# Patient Record
Sex: Female | Born: 1935 | Race: White | Hispanic: No | Smoking: Never smoker
Health system: Southern US, Community
[De-identification: ages and names within clinical notes are randomized; demographics above are authoritative.]

## PROBLEM LIST (undated history)

## (undated) DIAGNOSIS — I4891 Unspecified atrial fibrillation: Secondary | ICD-10-CM

## (undated) DIAGNOSIS — E119 Type 2 diabetes mellitus without complications: Secondary | ICD-10-CM

## (undated) DIAGNOSIS — R55 Syncope and collapse: Secondary | ICD-10-CM

## (undated) DIAGNOSIS — I10 Essential (primary) hypertension: Secondary | ICD-10-CM

## (undated) DIAGNOSIS — K311 Adult hypertrophic pyloric stenosis: Secondary | ICD-10-CM

## (undated) DIAGNOSIS — R42 Dizziness and giddiness: Secondary | ICD-10-CM

## (undated) DIAGNOSIS — F0390 Unspecified dementia without behavioral disturbance: Secondary | ICD-10-CM

## (undated) DIAGNOSIS — K6381 Dieulafoy lesion of intestine: Secondary | ICD-10-CM

## (undated) DIAGNOSIS — F419 Anxiety disorder, unspecified: Secondary | ICD-10-CM

## (undated) DIAGNOSIS — I2510 Atherosclerotic heart disease of native coronary artery without angina pectoris: Secondary | ICD-10-CM

## (undated) DIAGNOSIS — J302 Other seasonal allergic rhinitis: Secondary | ICD-10-CM

## (undated) DIAGNOSIS — I639 Cerebral infarction, unspecified: Secondary | ICD-10-CM

## (undated) DIAGNOSIS — I5043 Acute on chronic combined systolic (congestive) and diastolic (congestive) heart failure: Secondary | ICD-10-CM

## (undated) DIAGNOSIS — S42301A Unspecified fracture of shaft of humerus, right arm, initial encounter for closed fracture: Secondary | ICD-10-CM

## (undated) DIAGNOSIS — I679 Cerebrovascular disease, unspecified: Secondary | ICD-10-CM

## (undated) DIAGNOSIS — K635 Polyp of colon: Secondary | ICD-10-CM

## (undated) DIAGNOSIS — M1990 Unspecified osteoarthritis, unspecified site: Secondary | ICD-10-CM

## (undated) DIAGNOSIS — I214 Non-ST elevation (NSTEMI) myocardial infarction: Secondary | ICD-10-CM

## (undated) DIAGNOSIS — E785 Hyperlipidemia, unspecified: Secondary | ICD-10-CM

## (undated) DIAGNOSIS — I255 Ischemic cardiomyopathy: Secondary | ICD-10-CM

## (undated) HISTORY — PX: ABDOMINAL HYSTERECTOMY: SHX81

## (undated) HISTORY — PX: OTHER SURGICAL HISTORY: SHX169

---

## 1997-11-20 DIAGNOSIS — I2510 Atherosclerotic heart disease of native coronary artery without angina pectoris: Secondary | ICD-10-CM

## 1997-11-20 HISTORY — DX: Atherosclerotic heart disease of native coronary artery without angina pectoris: I25.10

## 1997-11-20 HISTORY — PX: CORONARY ARTERY BYPASS GRAFT: SHX141

## 1998-06-01 ENCOUNTER — Inpatient Hospital Stay (HOSPITAL_COMMUNITY): Admission: AD | Admit: 1998-06-01 | Discharge: 1998-06-09 | Payer: Self-pay | Admitting: Cardiovascular Disease

## 1998-08-22 ENCOUNTER — Emergency Department (HOSPITAL_COMMUNITY): Admission: EM | Admit: 1998-08-22 | Discharge: 1998-08-22 | Payer: Self-pay | Admitting: Emergency Medicine

## 1998-08-31 ENCOUNTER — Emergency Department (HOSPITAL_COMMUNITY): Admission: EM | Admit: 1998-08-31 | Discharge: 1998-08-31 | Payer: Self-pay | Admitting: Emergency Medicine

## 1998-09-10 ENCOUNTER — Encounter: Payer: Self-pay | Admitting: Cardiovascular Disease

## 1998-09-10 ENCOUNTER — Ambulatory Visit (HOSPITAL_COMMUNITY): Admission: RE | Admit: 1998-09-10 | Discharge: 1998-09-10 | Payer: Self-pay | Admitting: Cardiovascular Disease

## 1998-09-20 ENCOUNTER — Emergency Department (HOSPITAL_COMMUNITY): Admission: EM | Admit: 1998-09-20 | Discharge: 1998-09-20 | Payer: Self-pay | Admitting: Emergency Medicine

## 2001-03-05 ENCOUNTER — Ambulatory Visit (HOSPITAL_COMMUNITY): Admission: RE | Admit: 2001-03-05 | Discharge: 2001-03-05 | Payer: Self-pay | Admitting: Family Medicine

## 2001-03-05 ENCOUNTER — Encounter: Payer: Self-pay | Admitting: Family Medicine

## 2001-06-27 ENCOUNTER — Ambulatory Visit (HOSPITAL_COMMUNITY): Admission: RE | Admit: 2001-06-27 | Discharge: 2001-06-27 | Payer: Self-pay | Admitting: Family Medicine

## 2001-06-27 ENCOUNTER — Encounter: Payer: Self-pay | Admitting: Family Medicine

## 2002-02-13 ENCOUNTER — Other Ambulatory Visit: Admission: RE | Admit: 2002-02-13 | Discharge: 2002-02-13 | Payer: Self-pay | Admitting: Dermatology

## 2002-02-25 ENCOUNTER — Encounter: Payer: Self-pay | Admitting: Family Medicine

## 2002-02-25 ENCOUNTER — Ambulatory Visit (HOSPITAL_COMMUNITY): Admission: RE | Admit: 2002-02-25 | Discharge: 2002-02-25 | Payer: Self-pay | Admitting: Family Medicine

## 2002-10-01 ENCOUNTER — Encounter: Payer: Self-pay | Admitting: General Surgery

## 2002-10-01 ENCOUNTER — Ambulatory Visit (HOSPITAL_COMMUNITY): Admission: RE | Admit: 2002-10-01 | Discharge: 2002-10-01 | Payer: Self-pay | Admitting: General Surgery

## 2002-10-23 ENCOUNTER — Emergency Department (HOSPITAL_COMMUNITY): Admission: EM | Admit: 2002-10-23 | Discharge: 2002-10-23 | Payer: Self-pay | Admitting: Emergency Medicine

## 2002-11-19 ENCOUNTER — Other Ambulatory Visit: Admission: RE | Admit: 2002-11-19 | Discharge: 2002-11-19 | Payer: Self-pay | Admitting: Dermatology

## 2002-12-19 ENCOUNTER — Encounter: Payer: Self-pay | Admitting: Family Medicine

## 2002-12-19 ENCOUNTER — Ambulatory Visit (HOSPITAL_COMMUNITY): Admission: RE | Admit: 2002-12-19 | Discharge: 2002-12-19 | Payer: Self-pay | Admitting: Family Medicine

## 2003-01-09 ENCOUNTER — Ambulatory Visit (HOSPITAL_COMMUNITY): Admission: RE | Admit: 2003-01-09 | Discharge: 2003-01-09 | Payer: Self-pay | Admitting: Cardiology

## 2003-01-09 ENCOUNTER — Encounter: Payer: Self-pay | Admitting: Cardiology

## 2003-01-26 ENCOUNTER — Encounter (HOSPITAL_COMMUNITY): Admission: RE | Admit: 2003-01-26 | Discharge: 2003-02-25 | Payer: Self-pay | Admitting: Cardiology

## 2004-01-05 ENCOUNTER — Ambulatory Visit (HOSPITAL_COMMUNITY): Admission: RE | Admit: 2004-01-05 | Discharge: 2004-01-05 | Payer: Self-pay | Admitting: Family Medicine

## 2004-10-07 ENCOUNTER — Ambulatory Visit (HOSPITAL_COMMUNITY): Admission: RE | Admit: 2004-10-07 | Discharge: 2004-10-07 | Payer: Self-pay | Admitting: Family Medicine

## 2005-05-19 ENCOUNTER — Ambulatory Visit (HOSPITAL_COMMUNITY): Admission: RE | Admit: 2005-05-19 | Discharge: 2005-05-19 | Payer: Self-pay | Admitting: Family Medicine

## 2005-10-09 ENCOUNTER — Ambulatory Visit (HOSPITAL_COMMUNITY): Admission: RE | Admit: 2005-10-09 | Discharge: 2005-10-09 | Payer: Self-pay | Admitting: Family Medicine

## 2005-10-18 ENCOUNTER — Ambulatory Visit (HOSPITAL_COMMUNITY): Admission: RE | Admit: 2005-10-18 | Discharge: 2005-10-18 | Payer: Self-pay | Admitting: Family Medicine

## 2005-10-18 ENCOUNTER — Ambulatory Visit: Payer: Self-pay | Admitting: *Deleted

## 2005-10-23 ENCOUNTER — Ambulatory Visit: Payer: Self-pay | Admitting: Cardiology

## 2005-10-25 ENCOUNTER — Ambulatory Visit: Payer: Self-pay | Admitting: Cardiology

## 2005-10-25 ENCOUNTER — Encounter (HOSPITAL_COMMUNITY): Admission: RE | Admit: 2005-10-25 | Discharge: 2005-10-25 | Payer: Self-pay | Admitting: Cardiology

## 2005-12-19 ENCOUNTER — Ambulatory Visit (HOSPITAL_COMMUNITY): Admission: RE | Admit: 2005-12-19 | Discharge: 2005-12-19 | Payer: Self-pay | Admitting: Family Medicine

## 2006-10-12 ENCOUNTER — Ambulatory Visit (HOSPITAL_COMMUNITY): Admission: RE | Admit: 2006-10-12 | Discharge: 2006-10-12 | Payer: Self-pay | Admitting: Family Medicine

## 2006-10-31 ENCOUNTER — Other Ambulatory Visit: Admission: RE | Admit: 2006-10-31 | Discharge: 2006-10-31 | Payer: Self-pay | Admitting: Family Medicine

## 2006-10-31 ENCOUNTER — Encounter (INDEPENDENT_AMBULATORY_CARE_PROVIDER_SITE_OTHER): Payer: Self-pay | Admitting: *Deleted

## 2007-06-05 ENCOUNTER — Ambulatory Visit (HOSPITAL_COMMUNITY): Admission: RE | Admit: 2007-06-05 | Discharge: 2007-06-05 | Payer: Self-pay | Admitting: Family Medicine

## 2007-08-05 ENCOUNTER — Ambulatory Visit (HOSPITAL_COMMUNITY): Admission: RE | Admit: 2007-08-05 | Discharge: 2007-08-05 | Payer: Self-pay | Admitting: Family Medicine

## 2007-10-15 ENCOUNTER — Ambulatory Visit (HOSPITAL_COMMUNITY): Admission: RE | Admit: 2007-10-15 | Discharge: 2007-10-15 | Payer: Self-pay | Admitting: Family Medicine

## 2008-09-01 ENCOUNTER — Encounter (HOSPITAL_COMMUNITY): Admission: RE | Admit: 2008-09-01 | Discharge: 2008-10-01 | Payer: Self-pay | Admitting: Family Medicine

## 2008-10-06 ENCOUNTER — Encounter (HOSPITAL_COMMUNITY): Admission: RE | Admit: 2008-10-06 | Discharge: 2008-11-05 | Payer: Self-pay | Admitting: Family Medicine

## 2008-12-10 ENCOUNTER — Emergency Department (HOSPITAL_COMMUNITY): Admission: EM | Admit: 2008-12-10 | Discharge: 2008-12-10 | Payer: Self-pay | Admitting: Emergency Medicine

## 2009-02-22 ENCOUNTER — Encounter: Payer: Self-pay | Admitting: Cardiology

## 2009-03-02 ENCOUNTER — Ambulatory Visit (HOSPITAL_COMMUNITY): Admission: RE | Admit: 2009-03-02 | Discharge: 2009-03-02 | Payer: Self-pay | Admitting: Family Medicine

## 2009-05-28 ENCOUNTER — Ambulatory Visit: Payer: Self-pay | Admitting: Cardiology

## 2009-05-28 DIAGNOSIS — I10 Essential (primary) hypertension: Secondary | ICD-10-CM | POA: Insufficient documentation

## 2009-05-28 DIAGNOSIS — M1990 Unspecified osteoarthritis, unspecified site: Secondary | ICD-10-CM | POA: Insufficient documentation

## 2009-05-28 DIAGNOSIS — E109 Type 1 diabetes mellitus without complications: Secondary | ICD-10-CM | POA: Insufficient documentation

## 2009-05-28 DIAGNOSIS — I4891 Unspecified atrial fibrillation: Secondary | ICD-10-CM

## 2009-05-28 DIAGNOSIS — I2510 Atherosclerotic heart disease of native coronary artery without angina pectoris: Secondary | ICD-10-CM | POA: Insufficient documentation

## 2009-05-28 DIAGNOSIS — K219 Gastro-esophageal reflux disease without esophagitis: Secondary | ICD-10-CM

## 2009-05-28 HISTORY — DX: Unspecified atrial fibrillation: I48.91

## 2009-06-16 ENCOUNTER — Ambulatory Visit: Payer: Self-pay | Admitting: Cardiology

## 2009-06-16 ENCOUNTER — Encounter (HOSPITAL_COMMUNITY): Admission: RE | Admit: 2009-06-16 | Discharge: 2009-07-16 | Payer: Self-pay | Admitting: Cardiology

## 2009-06-23 ENCOUNTER — Encounter: Payer: Self-pay | Admitting: Cardiology

## 2009-10-29 ENCOUNTER — Emergency Department (HOSPITAL_COMMUNITY): Admission: EM | Admit: 2009-10-29 | Discharge: 2009-10-29 | Payer: Self-pay | Admitting: Emergency Medicine

## 2009-11-19 ENCOUNTER — Ambulatory Visit (HOSPITAL_COMMUNITY): Admission: RE | Admit: 2009-11-19 | Discharge: 2009-11-19 | Payer: Self-pay | Admitting: Family Medicine

## 2009-11-22 ENCOUNTER — Emergency Department (HOSPITAL_COMMUNITY): Admission: EM | Admit: 2009-11-22 | Discharge: 2009-11-22 | Payer: Self-pay | Admitting: Internal Medicine

## 2009-11-22 ENCOUNTER — Encounter: Payer: Self-pay | Admitting: Orthopedic Surgery

## 2009-11-24 ENCOUNTER — Ambulatory Visit: Payer: Self-pay | Admitting: Orthopedic Surgery

## 2009-11-24 DIAGNOSIS — S42209A Unspecified fracture of upper end of unspecified humerus, initial encounter for closed fracture: Secondary | ICD-10-CM | POA: Insufficient documentation

## 2009-11-25 ENCOUNTER — Telehealth: Payer: Self-pay | Admitting: Orthopedic Surgery

## 2009-11-27 ENCOUNTER — Encounter: Payer: Self-pay | Admitting: Orthopedic Surgery

## 2009-11-29 ENCOUNTER — Telehealth: Payer: Self-pay | Admitting: Orthopedic Surgery

## 2009-12-01 ENCOUNTER — Encounter: Payer: Self-pay | Admitting: Orthopedic Surgery

## 2009-12-07 ENCOUNTER — Encounter: Payer: Self-pay | Admitting: Orthopedic Surgery

## 2009-12-09 ENCOUNTER — Telehealth: Payer: Self-pay | Admitting: Orthopedic Surgery

## 2009-12-15 ENCOUNTER — Ambulatory Visit: Payer: Self-pay | Admitting: Orthopedic Surgery

## 2009-12-17 ENCOUNTER — Encounter: Payer: Self-pay | Admitting: Orthopedic Surgery

## 2009-12-20 ENCOUNTER — Telehealth: Payer: Self-pay | Admitting: Orthopedic Surgery

## 2010-01-05 ENCOUNTER — Encounter: Payer: Self-pay | Admitting: Orthopedic Surgery

## 2010-01-07 ENCOUNTER — Encounter: Payer: Self-pay | Admitting: Orthopedic Surgery

## 2010-01-10 ENCOUNTER — Telehealth: Payer: Self-pay | Admitting: Orthopedic Surgery

## 2010-01-31 ENCOUNTER — Ambulatory Visit: Payer: Self-pay | Admitting: Orthopedic Surgery

## 2010-02-07 ENCOUNTER — Encounter (HOSPITAL_COMMUNITY): Admission: RE | Admit: 2010-02-07 | Discharge: 2010-03-18 | Payer: Self-pay | Admitting: Orthopedic Surgery

## 2010-02-07 ENCOUNTER — Encounter: Payer: Self-pay | Admitting: Orthopedic Surgery

## 2010-03-04 ENCOUNTER — Encounter: Payer: Self-pay | Admitting: Orthopedic Surgery

## 2010-03-21 ENCOUNTER — Encounter (HOSPITAL_COMMUNITY): Admission: RE | Admit: 2010-03-21 | Discharge: 2010-04-20 | Payer: Self-pay | Admitting: Orthopedic Surgery

## 2010-04-01 ENCOUNTER — Encounter: Payer: Self-pay | Admitting: Orthopedic Surgery

## 2010-04-04 ENCOUNTER — Ambulatory Visit: Payer: Self-pay | Admitting: Orthopedic Surgery

## 2010-12-11 ENCOUNTER — Encounter: Payer: Self-pay | Admitting: Family Medicine

## 2010-12-12 ENCOUNTER — Encounter: Payer: Self-pay | Admitting: Family Medicine

## 2010-12-20 NOTE — Miscellaneous (Signed)
Summary: [facility]  [facility]   Imported By: Cammie Sickle 12/29/2009 19:27:27  _____________________________________________________________________  External Attachment:    Type:   Image     Comment:   External Document

## 2010-12-20 NOTE — Progress Notes (Signed)
Summary: Call to Home Health + patient and family  Phone Note Outgoing Call   Call placed to: Home care Summary of Call: I contacted [facility] (11/24/09) re: order per office visit 11/24/09 for home health re: ADL's.  Told by the agent that in order for insurance to cover, that patient would need to be in skilled nursing care  to receive  assistance with daily living chores, or be needing home physical therapy, which patient is not, at this time. I have advised patient and resp party family members, Mr/Mrs Alicia Mason, at ph [phone]- spoke with today, 11/25/09.  I have given name and information for facility available on self pay basis:  [facility], .  Patient and family would like [place] to forward the order to [facility] for them to review and advise.  Done. Initial call taken by: Cammie Sickle,  November 25, 2009 12:30 PM

## 2010-12-20 NOTE — Miscellaneous (Signed)
Summary: OT Progress nopte  OT Progress nopte   Imported By: Jacklynn Ganong 04/04/2010 15:02:57  _____________________________________________________________________  External Attachment:    Type:   Image     Comment:   External Document

## 2010-12-20 NOTE — Assessment & Plan Note (Signed)
Summary: 3 WK RE-CK HUMERUS/XRAYS/FX CARE/MEDICARE,UHC/CAF   Visit Type:  Follow-up Referring Provider:  emergency room  CC:  fracture right humerus .  History of Present Illness: I saw Alicia Mason in the office today for a followup visit.  She is a 77 years old woman with the complaint of:  DOI: 11/22/09 fell, right humerus fracture.  19 days old   TREATMENT: Sling.  MEDS: Norco 5.  Scheduled for:  recheck with xrays right proximal humerus.  Complaints:  doing well, in sling today.  XRAYS 2 V RT SHOULDER  PROX HUM FRACTURE COMMINUTED, MEETS NEER CRITERIA FOR NON - OP TREATMENT  IMPR: HEALING PROX HUM FRX, STABLE ALIGNMENT  A/P: PHYS THEREAPY   8  WEEK F/U     Allergies (verified): No Known Drug Allergies   Impression & Recommendations:  Problem # 1:  FRACTURE, HUMERUS, PROXIMAL (ICD-812.00) Assessment Improved  Orders: Home Health Referral (Home Health) Post-Op Check [phone]) Shoulder x-ray,  minimum 2 views (98119)  Patient Instructions: 1)  PHYSICAL THERAPY  2)  RETURN AFTER 8 WEEKS  3)  SLING OPTIONAL

## 2010-12-20 NOTE — Assessment & Plan Note (Signed)
Summary: 2 M RE-CK/?XRAY RT HUMERUSMEDICARE/CAF   Visit Type:  Follow-up Referring Provider:  emergency room  CC:  right humerus.  History of Present Illness:   Alicia Mason is 77 year old female injured her RIGHT shoulder in January on the third has a RIGHT proximal humerus fracture she is now completed her physical therapy.  She still has quite a bit of stiffness. 11/22/2009 DOI:  MEDS: None   She is doing really well her physical therapy notes indicate that she's got functional range of motion now she's not having any significant pain and she is returned to normal activities and wishes to drive which is okay as long as she's got license  Followup as needed    Allergies: No Known Drug Allergies   Other Orders: Est. Patient Level II (95621)  Patient Instructions: 1)  Please schedule a follow-up appointment as needed.

## 2010-12-20 NOTE — Miscellaneous (Signed)
Summary: OT progress note  OT progress note   Imported By: Jacklynn Ganong 03/10/2010 10:15:20  _____________________________________________________________________  External Attachment:    Type:   Image     Comment:   External Document

## 2010-12-20 NOTE — Assessment & Plan Note (Signed)
Summary: 6 WK RE-CK RT HUMERUS FOL'G PT/MEDICARE/CAF   Visit Type:  Follow-up Referring Provider:  emergency room  CC:  recheck rt humerus.  History of Present Illness: Caryn Bee is 77 year old female injured her RIGHT shoulder in January on the third has a RIGHT proximal humerus fracture she is now completed her physical therapy.  She still has quite a bit of stiffness.  MEDS: None, was told not to take pain med Norco 5.    Recommend additional physical therapy to see if we can improve her range of motion.   Allergies: No Known Drug Allergies   Impression & Recommendations:  Problem # 1:  FRACTURE, HUMERUS, PROXIMAL (ICD-812.00) Assessment Improved  slightly improved  Orders: Post-Op Check (16109)  Patient Instructions: 1)  Council on aging arrange for PT, 6 weeks left shoulder outpatient  2)  f/u 2 mos recheck left shoulder range of motion

## 2010-12-20 NOTE — Miscellaneous (Signed)
Summary: [facility] plan of care  [facility] plan of care   Imported By: Jacklynn Ganong 12/16/2009 15:42:47  _____________________________________________________________________  External Attachment:    Type:   Image     Comment:   External Document

## 2010-12-20 NOTE — Progress Notes (Signed)
Summary: question from physical therapist   Phone Note Other Incoming   Caller: Physical therapist Summary of Call: Fernande Bras, physical therapist w/[facility], left msg requesting a verbal order for additional week of physical therapy, to = 4 wks.  States he put it through as 3X week for 3 weeks in error.  He had also recently added OT.Marland Kitchen  Please call him at cell [phone]. Initial call taken by: Cammie Sickle,  January 10, 2010 3:15 PM  Follow-up for Phone Call        advised ok for another week Follow-up by: Ether Griffins,  January 10, 2010 3:22 PM

## 2010-12-20 NOTE — Letter (Signed)
Summary: History form  History form   Imported By: Jacklynn Ganong 11/29/2009 09:41:00  _____________________________________________________________________  External Attachment:    Type:   Image     Comment:   External Document

## 2010-12-20 NOTE — Progress Notes (Signed)
Summary: discharging from home health care  Phone Note Other Incoming   Summary of Call: Alicia Mason/[facility] called about Alicia Mason ([birth year])  She said [name] has help that comes in the morning, and she is doing really good, is going to discharge her from homehealth care.   Alicia Mason can be reachead at [phone] Initial call taken by: Alicia Mason,  December 09, 2009 2:55 PM

## 2010-12-20 NOTE — Progress Notes (Signed)
Summary: call from [facility] nurse  Phone Note From Other Clinic   Caller: Nurse Summary of Call: [facility] nurse, Candiss Norse, from [facility], ph [phone].  She is there evaluating patient, and relays that she will schedule aid to come to assist with bathing,dressing, per order.  Wanted to verify if physical therapy needed at this time.  Advised not at this time per recent office visit.  Patient to be re-evaluated at appt 1/26/11in office. Initial call taken by: Cammie Sickle,  November 29, 2009 11:54 AM

## 2010-12-20 NOTE — Miscellaneous (Signed)
Summary: [facility] order  [facility] order   Imported By: Jacklynn Ganong 01/14/2010 09:22:57  _____________________________________________________________________  External Attachment:    Type:   Image     Comment:   External Document

## 2010-12-20 NOTE — Miscellaneous (Signed)
Summary: [facility] orders  [facility] orders   Imported By: Jacklynn Ganong 01/14/2010 09:24:31  _____________________________________________________________________  External Attachment:    Type:   Image     Comment:   External Document

## 2010-12-20 NOTE — Progress Notes (Signed)
Summary: call from physical therapist  Phone Note From Other Clinic   Caller: PHYSICAL THERAPIST Summary of Call: rec'd call from physical therapist, Kendell Bane, [facility], asking if patient may also have OT ordered with PT;  please call [phone], fax #[phone] Initial call taken by: Cammie Sickle,  December 20, 2009 6:15 PM  Follow-up for Phone Call        ok  Follow-up by: Fuller Canada MD,  December 21, 2009 2:01 PM  Additional Follow-up for Phone Call Additional follow up Details #1::        faxed order Additional Follow-up by: Ether Griffins,  December 22, 2009 8:58 AM

## 2010-12-20 NOTE — Miscellaneous (Signed)
Summary: OT Clinical evaluation  OT Clinical evaluation   Imported By: Jacklynn Ganong 02/16/2010 09:03:39  _____________________________________________________________________  External Attachment:    Type:   Image     Comment:   External Document

## 2010-12-20 NOTE — Assessment & Plan Note (Signed)
Summary: [facility] er 11/22/09 broken rt arm xr there/medicare/bsf   Vital Signs:  Patient profile:   77 year old female Pulse rate:   78 / minute Resp:     16 per minute  Vitals Entered By: Fuller Canada MD (November 24, 2009 8:50 AM)  Visit Type:  first visit Referring Provider:  emergency room  CC:  right shoulder pain.  History of Present Illness: I saw Alicia Mason in the office today for an initial visit.  She is a 77 years old woman who injured her RIGHT humerus on November 22, 2009.  This is actually the third recent fall for her.  She complains of 10 out of 10 pain relief I hydrocodone 5 mg.  Her pain is worse with moving arm.  There is no numbness she can use her fingers and wrist without any difficulty.  She complains of soreness and aching.  Meds: Lantus insulin, Topril, Quinipril, Pravastatin, Aspirin, Xanax, Cipro, Norco 5.  No injury to the arm before this injury.  Allergies (verified): No Known Drug Allergies  Family History: Father: deceased 25 in his sleep Mother: deceased 59 pneumonia Siblings:  FH of Cancer:  Family History of Diabetes Family History Coronary Heart Disease female < 39 Family History of Arthritis  Social History: Tobacco Use - No.  Alcohol Use - no Regular Exercise - no martial staus:  widow one cup of coffee per day  Review of Systems General:  Denies weight loss, weight gain, fever, chills, and fatigue. Cardiac :  Complains of poor circulation; denies chest pain, angina, heart attack, heart failure, blood clots, and phlebitis. Resp:  Complains of short of breath; denies difficulty breathing, COPD, cough, and pneumonia. GI:  Complains of nausea and vomiting; denies diarrhea, constipation, difficulty swallowing, ulcers, GERD, and reflux. GU:  Denies kidney failure, kidney transplant, kidney stones, burning, poor stream, testicular cancer, blood in urine, and . Neuro:  Denies headache, dizziness, migraines, numbness, weakness, tremor, and  unsteady walking. MS:  Denies joint pain, rheumatoid arthritis, joint swelling, gout, bone cancer, osteoporosis, and . Endo:  Denies thyroid disease, goiter, and diabetes. Psych:  Complains of anxiety; denies depression, mood swings, panic attack, bipolar, and schizophrenia. Derm:  Denies eczema, cancer, and itching. EENT:  Complains of poor vision; denies cataracts, glaucoma, poor hearing, vertigo, ears ringing, sinusitis, hoarseness, toothaches, and bleeding gums. Immunology:  Complains of seasonal allergies; denies sinus problems and allergic to bee stings. Lymphatic:  Denies lymph node cancer and lymph edema.  Physical Exam  Msk:  This is a well-developed well-nourished female with excellent grooming and hygiene she has a large body habitus  As normal pulse and perfusion to the RIGHT upper extremity with normal temperature of the extremity.  She is awake alert and oriented x3 her mood and affect is normal.  The nerve function the RIGHT hand is normal.  There is no lymphadenopathy on the RIGHT.  The skin is notable for significant ecchymosis and bruising throughout the RIGHT upper extremity.  She ambulates normally has trouble getting up on the exam table  Inspection reveals tenderness in the proximal humerus with decreased shoulder range of motion elbow wrist and hand motion remains normal motor exam in the elbow wrist and hand also remained normal the shoulder motor function was not tested  Wrist elbow and stable in terms of joint stability.     Impression & Recommendations:  Problem # 1:  FRACTURE, HUMERUS, PROXIMAL (ICD-812.00) Assessment New The x-rays were done at [facility]. The report  and the films have been reviewed.  I agree that there is approximately humerus fracture.  It meets the near criteria for nonoperative treatment.  Recommend sling for 3 weeks x-rays in 3 weeks therapy for 6 weeks to follow.  Patient lives alone will need home health care for bathing assist  feeding, cooking, toileting.   Orders: Home Health Referral [facility]) New Patient Level III [phone]) [place]ck Fx (56213)  Medications Added to Medication List This Visit: 1)  Norco 5-325 Mg Tabs (Hydrocodone-acetaminophen) .... One by mouth q 4 hrs as needed pain 2)  Promethazine Hcl 25 Mg Tabs (Promethazine hcl) .... 1/2 tablet every 4 hrs as needed nausea  Patient Instructions: 1)  Continue sling 2)  Come back in 3 weeks for xrays. 3)  Needs home health for assistance ADL's Prescriptions: PROMETHAZINE HCL 25 MG TABS (PROMETHAZINE HCL) 1/2 tablet every 4 hrs as needed nausea  #60 x 1   Entered and Authorized by:   Fuller Canada MD   Signed by:   Fuller Canada MD on 11/24/2009   Method used:   Faxed to ...       [facility] (retail)       [address]       [place], [place]  [address]       Ph: [phone]       Fax: [phone]   RxID:   [id number]

## 2010-12-20 NOTE — Miscellaneous (Signed)
Summary: [facility] order  [facility] order   Imported By: Jacklynn Ganong 12/16/2009 15:41:33  _____________________________________________________________________  External Attachment:    Type:   Image     Comment:   External Document

## 2010-12-20 NOTE — Miscellaneous (Signed)
Summary: [facility] plan of care  [facility] plan of care   Imported By: Jacklynn Ganong 12/29/2009 08:16:40  _____________________________________________________________________  External Attachment:    Type:   Image     Comment:   External Document

## 2010-12-20 NOTE — Miscellaneous (Signed)
Summary: PT order  PT order   Imported By: Cammie Sickle 03/07/2010 07:04:42  _____________________________________________________________________  External Attachment:    Type:   Image     Comment:   External Document

## 2010-12-20 NOTE — Miscellaneous (Signed)
Summary: [facility] order  [facility] order   Imported By: Jacklynn Ganong 12/06/2009 14:22:21  _____________________________________________________________________  External Attachment:    Type:   Image     Comment:   External Document

## 2011-02-05 LAB — GLUCOSE, CAPILLARY

## 2011-02-10 ENCOUNTER — Ambulatory Visit (INDEPENDENT_AMBULATORY_CARE_PROVIDER_SITE_OTHER): Payer: Medicare Other | Admitting: Urology

## 2011-02-10 DIAGNOSIS — N952 Postmenopausal atrophic vaginitis: Secondary | ICD-10-CM

## 2011-02-10 DIAGNOSIS — N3946 Mixed incontinence: Secondary | ICD-10-CM

## 2011-04-04 NOTE — Assessment & Plan Note (Signed)
[facility]                       [place] CARDIOLOGY OFFICE NOTE   DEMIANA, CRUMBLEY                      MRN:          [id number]  DATE:05/28/2009                            DOB:          1921/12/16    Ms. Alicia Mason is a delightful 77 year old widowed white female who  comes today to reestablish with me as her cardiologist.  We have not  seen her in our office since 2006.   Her history is remarkable for coronary artery disease status post  coronary bypass grafting in July 1999.  She has done remarkably well.   Her last objective assessment for coronary disease was a stress test in  December 2006.  At that time, she had normal left ventricular function  with an inferolateral wall defect with minimal reversibility in the  anterior wall.  We have treated her medically.  Her Myoview was  consistent with a scan that was done in 2004.   She lives alone and continues to drive.  She is very independent.  Her  mental status is very bright.   She complains of no chest discomfort at present and has had really no  significant dyspnea on exertion.  Her mobility is a little bit limited  with her cane.   ALLERGIES:  KEFLEX.   She does not smoke or drink.   Her current meds are:  1. Lantus 38 units a day.  2. Toprol-XL 100 mg per day.  3. Quinapril 20 mg a day.  4. Pravastatin 10 mg a day.  5. Aspirin 81 mg a day.  6. Fish oil 3 a day.  7. Vitamin D 400 units a day.   Other surgeries include bilateral cataracts in 2007, hysterectomy in the  past as well.   SOCIAL HISTORY:  She is retired.  She enjoys walking.  She is a widow.   Her family history is remarkably for a sister who died of an MI at age  46.   REVIEW OF SYSTEMS:  She wears eyeglasses.  She has some  photosensitivity.  Her teeth are in good shape with no dentures.  She  occasionally has some urinary incontinence.  She has some lower back  pain from arthritis.  Other review of  systems are all questioned and are  negative.   PHYSICAL EXAMINATION:  GENERAL:  A very pleasant younger appearing than  stated age white female.  VITAL SIGNS:  She is 5 feet 3 inches, weighs 156 pounds.  Her blood  pressure is 184/75, her pulse is 64 and regular.  HEENT:  Arcus senilis.  Teeth are in good shape, otherwise negative.  Carotid upstrokes are equal bilaterally without bruits.  There is no  JVD.  Thyroid is not enlarged.  Trachea is midline.  NECK:  Supple.  CHEST:  Lungs are clear to auscultation and percussion.  Heart reveals a  nondisplaced PMI.  Normal S1 and S2.  Soft systolic murmur along the  left sternal border.  ABDOMEN:  Soft.  Good bowel sounds.  No obvious tenderness.  No obvious  organomegaly.  EXTREMITIES:  There were no cyanosis, clubbing, or  edema.  She has  varicose veins but no sign of DVT.  Pulses were present but reduced,  bilaterally symmetrical.  NEUROLOGIC:  Intact except using a cane.  SKIN:  Few ecchymoses.  MUSCULOSKELETAL:  Chronic arthritic changes.   Her electrocardiogram shows sinus rhythm with a left axis deviation,  poor R-wave progression in the anterior precordium, some lateral ST-  segment changes that are perhaps worse than last EKG, otherwise stable.   ASSESSMENT:  Coronary artery disease status post coronary bypass  grafting in 1999.  She is longer overdue an objective assessment of her  coronary disease and may indeed have silent ischemia considering her  age.  She is extremely independent, and we will proceed a [employer].   If this is negative for ischemia, we will continue with current  secondary preventative therapy as outlined by Dr. [name].  I will see  her back in a year.     Thomas C. Daleen Squibb, MD, [facility]  Electronically Signed    TCW/MedQ  DD: 05/28/2009  DT: 05/29/2009  Job #: [id number]   cc:   Patrica Duel, M.D.

## 2011-04-07 NOTE — Op Note (Signed)
   NAME:  SHANEA, KARNEY                         ACCOUNT NO.:  [id number]   MEDICAL RECORD NO.:  [id number]                   PATIENT TYPE:   LOCATION:                                       FACILITY:  [facility]   PHYSICIAN:  Vida Roller, M.D.                DATE OF BIRTH:  [birth year]   DATE OF PROCEDURE:  DATE OF DISCHARGE:                                    STRESS TEST   INDICATIONS FOR PROCEDURE:  The patient is a 77 year old female with known  coronary artery disease status post coronary artery bypass grafting in 1999.  She had the following grafts, left internal mammary artery to LAD, saphenous  vein graft to obtuse marginal I, obtuse marginal II, and saphenous vein  graft to posterior descending artery.  The patient has no cardiopulmonary  complaints.  She is here today for a routine stress test.   DESCRIPTION OF PROCEDURE:  There was 41 mg of adenosine infused over 4  minute protocol with Cardiolite injected at 3 minutes.  The patient reported  racing heartbeat with shortness of breath.  These symptoms resolved in  recovery.  EKG showed a few PVCs with no ischemic changes.  The computer was  reading heart rates in the 200s when the actual pulse was approximately 80-  100.   Final images and results are pending and being reviewed.     Amy Mercy Riding, P.A. [facility]                     Vida Roller, M.D.    AB/MEDQ  D:  01/26/2003  T:  01/26/2003  Job:  [id number]

## 2011-04-07 NOTE — Procedures (Signed)
NAMEWEDA, Alicia Mason               ACCOUNT NO.:  [id number]   MEDICAL RECORD NO.:  [id number]          PATIENT TYPE:  OUT   LOCATION:  RAD                           FACILITY:  [facility]   PHYSICIAN:  Charlton Haws, M.D.     DATE OF BIRTH:  21-Feb-1922   DATE OF PROCEDURE:  10/25/2005  DATE OF DISCHARGE:                                    STRESS TEST   HISTORY:  [name] is an 77 year old female with coronary disease status  post CABG in 1999 with the following grafts: LIMA to LAD, SVG to the OM-1  and OM-2, SVG to PDA. Cath in 1999 revealed occluded graft to the RCA.  Medical management was recommended. She had an Adenosine-Cardiolite most  recently in March of 2004 revealing a moderate-sized reversible defect of  the inferolateral wall and a fixed defect in the anterior wall. This was no  significant change from Cardiolite in 2001.   BASELINE DATA:  Electrocardiogram revealed sinus rhythm at 57 beats per  minute, nonspecific ST abnormalities, poor R-wave progression. Blood  pressure is 118/64.   There was 45 milligrams of Adenosine was infused over a four minute  protocol. Myoview was injected at three minutes. The patient reported  palpitations and jaw tightness that resolved in recovery. EKG revealed a few  PVCs. She did have some ST depression and T-wave inversion in the  inferolateral leads.   Final images and results are pending M.D. review.      Amy Mercy Riding, P.A. [facility]    ______________________________  Charlton Haws, M.D.    AB/MEDQ  D:  10/25/2005  T:  10/26/2005  Job:  [id number]

## 2011-05-26 ENCOUNTER — Ambulatory Visit (INDEPENDENT_AMBULATORY_CARE_PROVIDER_SITE_OTHER): Payer: Medicare Other | Admitting: Urology

## 2011-05-26 DIAGNOSIS — N952 Postmenopausal atrophic vaginitis: Secondary | ICD-10-CM

## 2011-05-26 DIAGNOSIS — N3946 Mixed incontinence: Secondary | ICD-10-CM

## 2011-07-11 ENCOUNTER — Emergency Department (HOSPITAL_COMMUNITY): Payer: Medicare Other

## 2011-07-11 ENCOUNTER — Inpatient Hospital Stay (HOSPITAL_COMMUNITY)
Admission: EM | Admit: 2011-07-11 | Discharge: 2011-07-13 | DRG: 281 | Disposition: A | Discharge status: Home / Self Care | Payer: Medicare Other | Attending: Internal Medicine | Admitting: Internal Medicine

## 2011-07-11 ENCOUNTER — Inpatient Hospital Stay (HOSPITAL_COMMUNITY): Payer: Medicare Other

## 2011-07-11 ENCOUNTER — Other Ambulatory Visit: Payer: Self-pay

## 2011-07-11 DIAGNOSIS — E785 Hyperlipidemia, unspecified: Secondary | ICD-10-CM | POA: Diagnosis present

## 2011-07-11 DIAGNOSIS — I10 Essential (primary) hypertension: Secondary | ICD-10-CM | POA: Diagnosis present

## 2011-07-11 DIAGNOSIS — R42 Dizziness and giddiness: Secondary | ICD-10-CM | POA: Diagnosis not present

## 2011-07-11 DIAGNOSIS — E119 Type 2 diabetes mellitus without complications: Secondary | ICD-10-CM | POA: Diagnosis present

## 2011-07-11 DIAGNOSIS — R7989 Other specified abnormal findings of blood chemistry: Secondary | ICD-10-CM

## 2011-07-11 DIAGNOSIS — I214 Non-ST elevation (NSTEMI) myocardial infarction: Principal | ICD-10-CM | POA: Diagnosis present

## 2011-07-11 DIAGNOSIS — Z951 Presence of aortocoronary bypass graft: Secondary | ICD-10-CM

## 2011-07-11 DIAGNOSIS — R55 Syncope and collapse: Secondary | ICD-10-CM | POA: Diagnosis present

## 2011-07-11 DIAGNOSIS — I498 Other specified cardiac arrhythmias: Secondary | ICD-10-CM | POA: Diagnosis present

## 2011-07-11 DIAGNOSIS — I255 Ischemic cardiomyopathy: Secondary | ICD-10-CM | POA: Diagnosis present

## 2011-07-11 DIAGNOSIS — I679 Cerebrovascular disease, unspecified: Secondary | ICD-10-CM | POA: Diagnosis present

## 2011-07-11 DIAGNOSIS — R001 Bradycardia, unspecified: Secondary | ICD-10-CM | POA: Diagnosis not present

## 2011-07-11 DIAGNOSIS — R531 Weakness: Secondary | ICD-10-CM

## 2011-07-11 DIAGNOSIS — I2589 Other forms of chronic ischemic heart disease: Secondary | ICD-10-CM | POA: Diagnosis present

## 2011-07-11 DIAGNOSIS — I2510 Atherosclerotic heart disease of native coronary artery without angina pectoris: Secondary | ICD-10-CM | POA: Diagnosis present

## 2011-07-11 DIAGNOSIS — R9431 Abnormal electrocardiogram [ECG] [EKG]: Secondary | ICD-10-CM

## 2011-07-11 HISTORY — DX: Cerebrovascular disease, unspecified: I67.9

## 2011-07-11 HISTORY — DX: Unspecified fracture of shaft of humerus, right arm, initial encounter for closed fracture: S42.301A

## 2011-07-11 HISTORY — DX: Non-ST elevation (NSTEMI) myocardial infarction: I21.4

## 2011-07-11 HISTORY — DX: Atherosclerotic heart disease of native coronary artery without angina pectoris: I25.10

## 2011-07-11 HISTORY — DX: Essential (primary) hypertension: I10

## 2011-07-11 HISTORY — DX: Dizziness and giddiness: R42

## 2011-07-11 HISTORY — DX: Syncope and collapse: R55

## 2011-07-11 HISTORY — DX: Type 2 diabetes mellitus without complications: E11.9

## 2011-07-11 HISTORY — DX: Ischemic cardiomyopathy: I25.5

## 2011-07-11 HISTORY — DX: Other seasonal allergic rhinitis: J30.2

## 2011-07-11 HISTORY — DX: Unspecified osteoarthritis, unspecified site: M19.90

## 2011-07-11 LAB — URINALYSIS, ROUTINE W REFLEX MICROSCOPIC
Bilirubin Urine: NEGATIVE
Glucose, UA: NEGATIVE mg/dL
Ketones, ur: NEGATIVE mg/dL
Nitrite: NEGATIVE
Protein, ur: NEGATIVE mg/dL
Specific Gravity, Urine: 1.01 (ref 1.005–1.030)
Urobilinogen, UA: 0.2 mg/dL (ref 0.0–1.0)
pH: 7 (ref 5.0–8.0)

## 2011-07-11 LAB — APTT: aPTT: 28 seconds (ref 24–37)

## 2011-07-11 LAB — GLUCOSE, CAPILLARY

## 2011-07-11 LAB — HEPATIC FUNCTION PANEL
ALT: 17 U/L (ref 0–35)
AST: 23 U/L (ref 0–37)
Albumin: 3.9 g/dL (ref 3.5–5.2)
Bilirubin, Direct: 0.1 mg/dL (ref 0.0–0.3)
Total Protein: 7.2 g/dL (ref 6.0–8.3)

## 2011-07-11 LAB — URINE MICROSCOPIC-ADD ON

## 2011-07-11 LAB — CARDIAC PANEL(CRET KIN+CKTOT+MB+TROPI)
CK, MB: 6.1 ng/mL (ref 0.3–4.0)
Total CK: 144 U/L (ref 7–177)
Troponin I: 0.64 ng/mL (ref <0.30)

## 2011-07-11 LAB — DIFFERENTIAL
Basophils Relative: 0 % (ref 0–1)
Eosinophils Absolute: 0.2 10*3/uL (ref 0.0–0.7)
Monocytes Absolute: 0.6 10*3/uL (ref 0.1–1.0)
Monocytes Relative: 9 % (ref 3–12)
Neutro Abs: 4.8 10*3/uL (ref 1.7–7.7)

## 2011-07-11 LAB — CBC
HCT: 39.1 % (ref 36.0–46.0)
Hemoglobin: 13.7 g/dL (ref 12.0–15.0)
MCH: 32 pg (ref 26.0–34.0)
MCHC: 35 g/dL (ref 30.0–36.0)

## 2011-07-11 LAB — PROTIME-INR
INR: 0.92 (ref 0.00–1.49)
Prothrombin Time: 12.6 seconds (ref 11.6–15.2)

## 2011-07-11 LAB — BASIC METABOLIC PANEL WITH GFR
BUN: 11 mg/dL (ref 6–23)
CO2: 29 meq/L (ref 19–32)
Calcium: 9.3 mg/dL (ref 8.4–10.5)
Chloride: 103 meq/L (ref 96–112)
Creatinine, Ser: 0.68 mg/dL (ref 0.50–1.10)
GFR calc Af Amer: >60 mL/min
GFR calc non Af Amer: >60 mL/min
Glucose, Bld: 160 mg/dL — ABNORMAL HIGH (ref 70–99)
Potassium: 3.8 meq/L (ref 3.5–5.1)
Sodium: 141 meq/L (ref 135–145)

## 2011-07-11 LAB — MRSA PCR SCREENING: MRSA by PCR: NEGATIVE

## 2011-07-11 LAB — HEMOGLOBIN A1C
Hgb A1c MFr Bld: 6.9 % — ABNORMAL HIGH (ref <5.7)
Mean Plasma Glucose: 151 mg/dL — ABNORMAL HIGH (ref <117)

## 2011-07-11 LAB — HEPARIN LEVEL (UNFRACTIONATED): Heparin Unfractionated: 0.94 IU/mL — ABNORMAL HIGH (ref 0.30–0.70)

## 2011-07-11 MED ORDER — ASPIRIN EC 81 MG PO TBEC
81.0000 mg | DELAYED_RELEASE_TABLET | Freq: Every day | ORAL | Status: DC
  Administered 2011-07-11: 81 mg via ORAL
  Filled 2011-07-11: qty 1

## 2011-07-11 MED ORDER — METOPROLOL SUCCINATE ER 50 MG PO TB24
100.0000 mg | ORAL_TABLET | Freq: Every day | ORAL | Status: DC
  Administered 2011-07-11 – 2011-07-12 (×2): 100 mg via ORAL
  Filled 2011-07-11 (×2): qty 2

## 2011-07-11 MED ORDER — QUINAPRIL HCL 10 MG PO TABS: 40.0000 mg | ORAL_TABLET | Freq: Every day | ORAL | Status: DC

## 2011-07-11 MED ORDER — MECLIZINE HCL 12.5 MG PO TABS
12.5000 mg | ORAL_TABLET | Freq: Every day | ORAL | Status: DC | PRN
Start: 2011-07-11 — End: 2011-07-13

## 2011-07-11 MED ORDER — ALPRAZOLAM 0.25 MG PO TABS
0.2500 mg | ORAL_TABLET | Freq: Every day | ORAL | Status: DC | PRN
  Administered 2011-07-11 – 2011-07-12 (×2): 0.25 mg via ORAL
  Filled 2011-07-11 (×2): qty 1

## 2011-07-11 MED ORDER — ACETAMINOPHEN 325 MG PO TABS: 650.0000 mg | ORAL_TABLET | Freq: Four times a day (QID) | ORAL | Status: DC | PRN

## 2011-07-11 MED ORDER — INSULIN ASPART 100 UNIT/ML ~~LOC~~ SOLN
0.0000 [IU] | Freq: Every day | SUBCUTANEOUS | Status: DC
  Administered 2011-07-11 – 2011-07-12 (×2): 2 [IU] via SUBCUTANEOUS

## 2011-07-11 MED ORDER — POLYVINYL ALCOHOL 1.4 % OP SOLN
1.0000 [drp] | Freq: Every day | OPHTHALMIC | Status: DC | PRN
  Filled 2011-07-11: qty 15

## 2011-07-11 MED ORDER — INSULIN GLARGINE 100 UNIT/ML ~~LOC~~ SOLN: 5.0000 [IU] | Freq: Every day | SUBCUTANEOUS | Status: DC

## 2011-07-11 MED ORDER — DOCUSATE SODIUM 100 MG PO CAPS
100.0000 mg | ORAL_CAPSULE | Freq: Two times a day (BID) | ORAL | Status: DC
  Administered 2011-07-11 – 2011-07-13 (×5): 100 mg via ORAL
  Filled 2011-07-11 (×5): qty 1

## 2011-07-11 MED ORDER — ASPIRIN 325 MG PO TABS
325.0000 mg | ORAL_TABLET | Freq: Once | ORAL | Status: AC
  Administered 2011-07-11: 325 mg via ORAL
  Filled 2011-07-11: qty 1

## 2011-07-11 MED ORDER — OXYBUTYNIN CHLORIDE 5 MG PO TABS
5.0000 mg | ORAL_TABLET | Freq: Every day | ORAL | Status: DC
  Administered 2011-07-11 – 2011-07-13 (×3): 5 mg via ORAL
  Filled 2011-07-11 (×3): qty 1

## 2011-07-11 MED ORDER — MORPHINE SULFATE 2 MG/ML IJ SOLN: 2.0000 mg | INTRAMUSCULAR | Status: DC | PRN

## 2011-07-11 MED ORDER — INSULIN GLARGINE 100 UNIT/ML ~~LOC~~ SOLN
15.0000 [IU] | Freq: Every day | SUBCUTANEOUS | Status: DC
  Administered 2011-07-11 – 2011-07-12 (×2): 15 [IU] via SUBCUTANEOUS
  Filled 2011-07-11: qty 3

## 2011-07-11 MED ORDER — ONDANSETRON HCL 4 MG/2ML IJ SOLN: 4.0000 mg | Freq: Four times a day (QID) | INTRAMUSCULAR | Status: DC | PRN

## 2011-07-11 MED ORDER — HYDROCODONE-ACETAMINOPHEN 5-325 MG PO TABS: 1.0000 | ORAL_TABLET | Freq: Four times a day (QID) | ORAL | Status: DC | PRN

## 2011-07-11 MED ORDER — HEPARIN (PORCINE) IN NACL 100-0.45 UNIT/ML-% IJ SOLN
12.0000 [IU]/kg/h | INTRAMUSCULAR | Status: DC
  Filled 2011-07-11: qty 250

## 2011-07-11 MED ORDER — HEPARIN BOLUS VIA INFUSION
3000.0000 [IU] | Freq: Once | INTRAVENOUS | Status: AC
  Administered 2011-07-11: 3000 [IU] via INTRAVENOUS
  Filled 2011-07-11: qty 3000

## 2011-07-11 MED ORDER — ALUM & MAG HYDROXIDE-SIMETH 200-200-20 MG/5ML PO SUSP: 30.0000 mL | Freq: Four times a day (QID) | ORAL | Status: DC | PRN

## 2011-07-11 MED ORDER — POLYETHYL GLYCOL-PROPYL GLYCOL 0.4-0.3 % OP SOLN: 1.0000 [drp] | Freq: Every day | OPHTHALMIC | Status: DC | PRN

## 2011-07-11 MED ORDER — ACETAMINOPHEN 650 MG RE SUPP: 650.0000 mg | Freq: Four times a day (QID) | RECTAL | Status: DC | PRN

## 2011-07-11 MED ORDER — HEPARIN BOLUS VIA INFUSION
1000.0000 [IU] | Freq: Once | INTRAVENOUS | Status: DC
  Administered 2011-07-11: 1000 [IU] via INTRAVENOUS
  Filled 2011-07-11: qty 1000

## 2011-07-11 MED ORDER — SIMVASTATIN 10 MG PO TABS
5.0000 mg | ORAL_TABLET | Freq: Every day | ORAL | Status: DC
  Administered 2011-07-11: 5 mg via ORAL
  Filled 2011-07-11 (×3): qty 1

## 2011-07-11 MED ORDER — INSULIN ASPART 100 UNIT/ML ~~LOC~~ SOLN
0.0000 [IU] | Freq: Three times a day (TID) | SUBCUTANEOUS | Status: DC
  Administered 2011-07-11: 3 [IU] via SUBCUTANEOUS
  Administered 2011-07-12: 5 [IU] via SUBCUTANEOUS
  Administered 2011-07-13: 3 [IU] via SUBCUTANEOUS

## 2011-07-11 MED ORDER — ONDANSETRON HCL 4 MG PO TABS: 4.0000 mg | ORAL_TABLET | Freq: Four times a day (QID) | ORAL | Status: DC | PRN

## 2011-07-11 MED ORDER — LISINOPRIL 10 MG PO TABS
40.0000 mg | ORAL_TABLET | Freq: Every day | ORAL | Status: DC
  Administered 2011-07-11 – 2011-07-13 (×3): 40 mg via ORAL
  Filled 2011-07-11 (×3): qty 4

## 2011-07-11 NOTE — ED Notes (Signed)
Pt being eval for admission 

## 2011-07-11 NOTE — Consult Note (Signed)
Patient seen and examined. Discussed with Ms. Lawrence. Her history was reviewed. She presents today after developing the sudden onset of ataxia and a feeling of leg weakness, states that she felt "drunk" as if she could not control herself while walking. She had no palpitations or chest pain at that time, or subsequently. In the ER she was noted to have a minimally elevated point-of-care troponin, ECG overall with chronic abnormalities. Question old anterior infarct pattern. Blood pressure also noted to be elevated, rhythm has been sinus. She reports a history of vertigo, but states that this presentation was different. She thought that perhaps her blood glucose was low, drank some orange juice when she was at home. She was in fact hyperglycemic at presentation.  Subsequently, a CT scan of the head done earlier today showed evidence of no hemorrhage with small vessel disease and a subtle hypodensity in the anterior aspect of the right internal capsule that was new compared to prior examination. Possibility of an acute infarct is to be considered.  We are consulted in light of the patient's abnormal troponin level. Recommendation at this time is to further rule out possible CNS event, consider MRI of the brain. Would hold off on heparin until this is excluded. Full set of cardiac markers are to be ordered as well as echocardiogram and followup ECG. Based on her clinical progress and further objective testing, we can determine if additional cardiovascular evaluation is needed.

## 2011-07-11 NOTE — Consult Note (Signed)
CARDIOLOGY CONSULT NOTE  Patient ID: Alicia Mason MRN: [id number] DOB/AGE: 12/04/1921 77 y.o.  Admit date: 07/11/2011 Referring Physician: [name] (Hospitalist) Primary Physician: [name] Primary Cardiologist: [name] Reason for Consultation: Positive Cardiac Enzymes in the setting of known CAD  HPI: 77 y/o very pleasant female patient of Dr.[name] with known history of CAD, CABG (1999), Diabetes, CVA, and hypertension, who has not been seen in the office for two years, with admission via ER for complaints of weakness and near syncope.  She was found to have positive cardiac enzymes (POC) with TpI of 0.19.  We are asked to evaluate further from cardiac standpoint. EKG was found to be abnormal with evidence of anterior infarct.    On discussion with the patient, she was in her usual state of health, got up this morning and attempted to make her breakfast but began to have "a drunk feeling" she was staggering and legs felt weak and uncoordinated. She thought it was her blood sugar so she got a cup of orange juice to drink and made her way to a chair in the livingroom. She called her granddaughter who brought he to ER. By the time her granddaughter arrived she was feeling better, and was able to walk to the car without stumbling or feeling drunk. She was hypertensive with BP 182/80, HR 79 bpm.  Blood glucose was 160 on arrival.  She denies chest pain, dyspnea, NV, or diaphoresis. She complains of her legs feeling weak over the last few months, but no falls or tripping.  She uses a walker for ambulation. She has had vertigo in the past and has used the walker ever since.    Review of systems complete and found to be negative unless listed above  Past Medical History  Diagnosis Date  . Essential hypertension, benign   . Type 2 diabetes mellitus   . Coronary atherosclerosis of native coronary artery     Multivessel  . Closed fracture of right humerus     Managed conservatively 2011  . CAD  (coronary artery disease) 07/11/2011  . Hx of CABG 07/11/2011  . DM type 2 (diabetes mellitus, type 2) 07/11/2011  . HTN (hypertension), malignant 07/11/2011  . Cerebrovascular disease 07/11/2011  . Seasonal allergies   . Arthritis     Family History  Problem Relation Age of Onset  . Coronary artery disease Sister     Died with MI age 60    History   Social History  . Marital Status: Widowed    Spouse Name: N/A    Number of Children: N/A  . Years of Education: N/A   Occupational History  . Retired    Social History Main Topics  . Smoking status: Never Smoker   . Smokeless tobacco: Never Used  . Alcohol Use: No  . Drug Use: No  . Sexually Active: Not on file   Other Topics Concern  . Not on file   Social History Narrative  . No narrative on file    Past Surgical History  Procedure Date  . Coronary artery bypass graft 1999    LIMA to LAD, SVG to OM1 and OM2, SVG to PDA  . Cataract surgery   . Abdominal hysterectomy     WITH BSO.     Prescriptions prior to admission  Medication Sig Dispense Refill  . ALPRAZolam (XANAX) 0.5 MG tablet Take 0.25 mg by mouth daily as needed. Anxiety       . aspirin 81 MG EC tablet  Take 81 mg by mouth daily.        Marland Kitchen HYDROcodone-acetaminophen (VICODIN) 5-500 MG per tablet Take 1 tablet by mouth every 6 (six) hours as needed. Pain       . insulin glargine (LANTUS) 100 UNIT/ML injection Inject 35 Units into the skin daily.        . meclizine (ANTIVERT) 12.5 MG tablet Take 12.5 mg by mouth daily as needed. Motion sickness       . metoprolol (TOPROL-XL) 100 MG 24 hr tablet Take 100 mg by mouth daily.        Marland Kitchen oxybutynin (DITROPAN) 5 MG tablet Take 5 mg by mouth daily.        Bertram Gala Glycol-Propyl Glycol (SYSTANE OP) Place 1 drop into both eyes daily as needed. Dry Eyes       . pravastatin (PRAVACHOL) 10 MG tablet Take 10 mg by mouth daily.        . quinapril (ACCUPRIL) 40 MG tablet Take 40 mg by mouth daily.          Physical  Exam: Blood pressure 182/80, pulse 79, temperature 98 F (36.7 C), temperature source Oral, resp. rate 18, height 5\' 3"  (1.6 m), weight 150 lb (68.04 kg), SpO2 100.00%.   General: Well developed, well nourished, in no acute distress Head: Eyes PERRLA, No xanthomas.   Normal cephalic and atramatic  Lungs: Clear bilaterally to auscultation and percussion. Heart: HRRR S1 S2, Soft systolic murmur.  Pulses are 2+ & equal.            No carotid bruit. No JVD.  No abdominal bruits. No femoral bruits. Abdomen: Bowel sounds are positive, abdomen soft and non-tender without masses or                  Hernia's noted. Msk:  Back normal, normal gait. Normal strength and tone for age. Extremities: No clubbing, cyanosis or edema.  DP +1 Neuro: Alert and oriented X 3. Psych:  Good affect, responds appropriately Labs:   Lab Results  Component Value Date   WBC 6.8 07/11/2011   HGB 13.7 07/11/2011   HCT 39.1 07/11/2011   MCV 91.4 07/11/2011   PLT 179 07/11/2011    Lab 07/11/11 1004  NA 141  K 3.8  CL 103  CO2 29  BUN 11  CREATININE 0.68  CALCIUM 9.3  PROT 7.2  BILITOT 0.5  ALKPHOS 110  ALT 17  AST 23  GLUCOSE 160*   No results found for this basename: CKTOTAL, CKMB, CKMBINDEX, TROPONINI    No results found for this basename: CHOL   No results found for this basename: HDL   No results found for this basename: LDLCALC   No results found for this basename: TRIG   No results found for this basename: CHOLHDL   No results found for this basename: LDLDIRECT      Radiology:Chest X-ray (07/11/2011) IMPRESSION: No evidence of acute cardiopulmonary disease. Cardiomegaly.  CT of Head: (07/11/2011) IMPRESSION: Small vessel disease type changes. Subtle hypodensity anterior aspect of the anterior limb of the right internal capsule new from prior exam. This may be related to result of small vessel disease. Small acute infarct not entirely excluded. No CT evidence of large acute infarct.  No  intracranial hemorrhage.     EKG:*NSR rate of 68 bpm. Anterior infarct, age undetermined.  ASSESSMENT AND PLAN:   1. Near syncope: She denies palpitations, chest pain or vision disturbances associated with this.  CT scan is  abnormal for small acute infarct in the anterior aspect of the anterior limb of the right internal capsule, new from prior exam.  Would consider MRI to assess for hemorrhage before starting heparin.   2.  Hypertension: Currently elevated, but is receiving home medications.  Will follow the trend on medications.  3. CAD:  She has one POC that is positive at 0.19.  Will cycle full cardiac enzymes, have echo completed for LV. Will continue BB and ACE. Follow.  More recommendations per Dr. Diona Browner. Signed: Bettey Mare. Uliana Brinker NP 07/11/2011, 3:51 PM Co-Sign MD

## 2011-07-11 NOTE — ED Notes (Signed)
edp in with pt. Pt complain of legs being week.

## 2011-07-11 NOTE — ED Notes (Signed)
Pt to be admit. Pt and family aware 

## 2011-07-11 NOTE — ED Provider Notes (Signed)
Scribed for Joya Gaskins, MD, the patient was seen in room APA18/[facility]18 . This chart was scribed by Ellie Lunch. This patient's care was started at 9:56 AM.   CSN: [id number] Arrival date & time: 07/11/2011  9:49 AM  Chief Complaint  Patient presents with  . Weakness   Patient is a 77 y.o. female presenting with extremity weakness. The history is provided by the patient.  Extremity Weakness This is a new problem. The current episode started 1 to 2 hours ago. The problem has been resolved. Pertinent negatives include no chest pain, no abdominal pain, no headaches and no shortness of breath. The symptoms are aggravated by standing. Relieved by: sitting. She has tried nothing for the symptoms.    Alicia Mason is a 77 y.o. female who presents to the Emergency Department complaining of weakness. Pt reports this morning she was standing preparing breakfast and suddenly felt weak in her legs like she was going to fall. She was able to sit down without falling. The episode of weakness lasted until she sat down. Sx are now resolved. She denies HA, vision changes, CP, SOB, abd, neck, or back pain. Pt reports she has experienced vertigo before and her similars were not similar to this episode. Pt denies history of stroke, and has had multiple CABG.   Pt was seen at 9:55.  Past Medical History  Diagnosis Date  . Hypertension   . Diabetes mellitus   . Renal disorder   . Arthritis   . Vertigo     Past Surgical History  Procedure Date  . Cardiac surgery   . Eye surgery    MEDICATIONS:  Previous Medications   No medications on file     ALLERGIES:  Allergies as of 07/11/2011  . (No Known Allergies)    No family history on file.  History  Substance Use Topics  . Smoking status: Never Smoker   . Smokeless tobacco: Not on file  . Alcohol Use: No    Review of Systems  HENT: Negative for neck pain.   Eyes: Negative for visual disturbance.  Respiratory: Negative for chest  tightness and shortness of breath.   Cardiovascular: Negative for chest pain.  Gastrointestinal: Negative for abdominal pain.  Musculoskeletal: Positive for extremity weakness. Negative for back pain.  Neurological: Negative for headaches.    Physical Exam  BP 182/80  Pulse 79  Temp(Src) 98 F (36.7 C) (Oral)  Resp 18  Ht 5\' 3"  (1.6 m)  Wt 150 lb (68.04 kg)  BMI 26.57 kg/m2  SpO2 100%  Physical Exam .CONSTITUTIONAL: Well developed/well nourished HEAD AND FACE: Normocephalic/atraumatic EYES: EOMI ENMT: Mucous membranes moist NECK: supple no meningeal signs SPINE:entire spine nontender CV: S1/S2 noted, no murmurs/rubs/gallops noted LUNGS: Lungs are clear to auscultation bilaterally, no apparent distress ABDOMEN: soft, nontender, no rebound or guarding GU:no cva tenderness NEURO: Pt is awake/alert, moves all extremitiesx4. No facial droop, no leg droop, nrml gait.  Cranial nerves 3/4/5/6/05/28/09/11/12 tested and intact EXTREMITIES: pulses normal, full ROM SKIN: warm, color normal PSYCH: no abnormalities of mood noted  OTHER DATA REVIEWED: Nursing notes, vital signs, and past medical records reviewed. xrays reviewed and considered All labs/vitals reviewed and considered    DIAGNOSTIC STUDIES: Oxygen Saturation is 100% on room air, normal by my interpretation.     Date: 07/11/2011  Rate: 68  Rhythm: normal sinus rhythm  QRS Axis: left  Intervals: normal  ST/T Wave abnormalities: nonspecific ST changes  Conduction Disutrbances:none  Narrative Interpretation:  Old EKG Reviewed: unchanged   LABS / RADIOLOGY:  Results for orders placed during the hospital encounter of 07/11/11  CBC      Component Value Range   WBC 6.8  4.0 - 10.5 (K/uL)   RBC 4.28  3.87 - 5.11 (MIL/uL)   Hemoglobin 13.7  12.0 - 15.0 (g/dL)   HCT 09.8  11.9 - 14.7 (%)   MCV 91.4  78.0 - 100.0 (fL)   MCH 32.0  26.0 - 34.0 (pg)   MCHC 35.0  30.0 - 36.0 (g/dL)   RDW 82.9  56.2 - 13.0 (%)    Platelets 179  150 - 400 (K/uL)  DIFFERENTIAL      Component Value Range   Neutrophils Relative 70  43 - 77 (%)   Neutro Abs 4.8  1.7 - 7.7 (K/uL)   Lymphocytes Relative 19  12 - 46 (%)   Lymphs Abs 1.3  0.7 - 4.0 (K/uL)   Monocytes Relative 9  3 - 12 (%)   Monocytes Absolute 0.6  0.1 - 1.0 (K/uL)   Eosinophils Relative 2  0 - 5 (%)   Eosinophils Absolute 0.2  0.0 - 0.7 (K/uL)   Basophils Relative 0  0 - 1 (%)   Basophils Absolute 0.0  0.0 - 0.1 (K/uL)  BASIC METABOLIC PANEL      Component Value Range   Sodium 141  135 - 145 (mEq/L)   Potassium 3.8  3.5 - 5.1 (mEq/L)   Chloride 103  96 - 112 (mEq/L)   CO2 29  19 - 32 (mEq/L)   Glucose, Bld 160 (*) 70 - 99 (mg/dL)   BUN 11  6 - 23 (mg/dL)   Creatinine, Ser 8.65  0.50 - 1.10 (mg/dL)   Calcium 9.3  8.4 - 78.4 (mg/dL)   GFR calc non Af Amer >60  >60 (mL/min)   GFR calc Af Amer >60  >60 (mL/min)  URINALYSIS, ROUTINE W REFLEX MICROSCOPIC      Component Value Range   Color, Urine YELLOW  YELLOW    Appearance CLEAR  CLEAR    Specific Gravity, Urine 1.010  1.005 - 1.030    pH 7.0  5.0 - 8.0    Glucose, UA NEGATIVE  NEGATIVE (mg/dL)   Hgb urine dipstick TRACE (*) NEGATIVE    Bilirubin Urine NEGATIVE  NEGATIVE    Ketones, ur NEGATIVE  NEGATIVE (mg/dL)   Protein, ur NEGATIVE  NEGATIVE (mg/dL)   Urobilinogen, UA 0.2  0.0 - 1.0 (mg/dL)   Nitrite NEGATIVE  NEGATIVE    Leukocytes, UA SMALL (*) NEGATIVE   POCT I-STAT TROPONIN I      Component Value Range   Troponin i, poc 0.19 (*) 0.00 - 0.08 (ng/mL)   Comment 3           URINE MICROSCOPIC-ADD ON      Component Value Range   Squamous Epithelial / LPF FEW (*) RARE    WBC, UA 0-2  <3 (WBC/hpf)   RBC / HPF 3-6  <3 (RBC/hpf)   Ct Head Wo Contrast  07/11/2011  *RADIOLOGY REPORT*  Clinical Data: Weakness and dizziness.  High blood pressure. Diabetes.  CT HEAD WITHOUT CONTRAST  Technique:  Contiguous axial images were obtained from the base of the skull through the vertex without contrast.   Comparison: 01/05/2004.  Findings: No intracranial hemorrhage.  No intracranial mass lesion detected on this unenhanced exam.  Small vessel disease type changes.  Subtle hypodensity anterior aspect of the anterior limb  of the right internal capsule new from prior exam.  This may be related to result of small vessel disease. Small acute infarct not entirely excluded.  No CT evidence of large acute infarct.  Vascular calcifications.  Partially empty sella incidentally noted.  Global atrophy without hydrocephalus.  IMPRESSION: Small vessel disease type changes.  Subtle hypodensity anterior aspect of the anterior limb of the right internal capsule new from prior exam.  This may be related to result of small vessel disease. Small acute infarct not entirely excluded.  No CT evidence of large acute infarct.  No intracranial hemorrhage.  Original Report Authenticated By: Fuller Canada, M.D.   Dg Chest Portable 1 View  07/11/2011  *RADIOLOGY REPORT*  Clinical Data: Weakness  PORTABLE CHEST - 1 VIEW  Comparison: None.  Findings: Lungs are clear. No pleural effusion or pneumothorax.  Cardiomegaly. Postsurgical changes related to prior CABG.  IMPRESSION: No evidence of acute cardiopulmonary disease.  Cardiomegaly.  Original Report Authenticated By: Charline Bills, M.D.    ED COURSE / COORDINATION OF CARE: Pt found to have +troponin, no EKG changes H/o CABG Will call cardiology 11:02 AM D/w dr Eden Emms, does not feel she needs to be emergently transferred to [facility] Pt stable, no CP at this time 11:13 AM  12:18 PM d/w dr Sherrie Mustache, she will call cardiology and call me back 12:32 PM d/w dr Sherrie Mustache, will admit patient.  Admit stepdown Pt stabilized in ED       CRITICAL CARE Performed by: Joya Gaskins Authorized by: Joya Gaskins Total critical care time: 31 minutes Critical care was necessary to treat or prevent imminent or life-threatening deterioration of the following conditions: cardiac failure  and CNS failure or compromise. Critical care was time spent personally by me on the following activities: discussions with consultants, obtaining history from patient or surrogate, ordering and review of radiographic studies, review of old charts, re-evaluation of patient's condition, ordering and review of laboratory studies, examination of patient, development of treatment plan with patient or surrogate and pulse oximetry.       I personally performed the services described in this documentation, which was scribed in my presence. The recorded information has been reviewed and considered. Joya Gaskins, MD    Joya Gaskins, MD 07/11/11 [phone]

## 2011-07-11 NOTE — H&P (Signed)
Alicia Mason MRN: [id number] DOB/AGE: 08/17/1922 77 y.o. Primary Care Physician: [facility] MEDICAL Admit date: 07/11/2011 Chief Complaint: Weakness.  HPI: The patient is an 77 year old woman with a past medical history significant for coronary artery disease, status post CABG in 1999, type 2 diabetes mellitus, and hypertension. She presents to the hospital today with a chief complaint of weakness in her legs and a feeling as if she was going to pass out. She got up this morning in her usual state of health. In the process of preparing breakfast, all of a sudden, she began to feel weak. She felt as if her legs were unable to hold her weight. She says that she staggered around a bit. She did not fall. She felt that maybe her blood sugar was too low. She immediately drank a half a cup of orange juice. She called her daughter, Alicia Mason, who came over to check on her. Alicia Mason states that the patient looked a little flushed. There was no evidence of a facial droop, focal unilateral weakness, slurred speech, or difficulty forming words. The patient denies dizziness, vertigo, headache, visual changes, diaphoresis, chest pain, or shortness of breath. She had no palpitations during this episode.  In the emergency department, the patient is noted to be hypertensive with a blood pressure of 182/80. Her blood glucose is 160.  Her chest x-ray reveals cardiomegaly but no acute cardiopulmonary findings. The CT scan of her head reveals ischemic type small vessel changes but no obvious acute infarct. Her EKG reveals normal sinus rhythm with a heart rate of 68 beats per minute and Q waves in the anterior leads. Her troponin I is 0.19. Emergency department physician, Dr. Bebe Shaggy, discussed the patient's findings with cardiologist on call at [facility], Dr. Eden Emms. He believed that the patient could stay  here at [facility] for further evaluation and management. Therefore she is being admitted.   Past  Medical History  Diagnosis Date  . Essential hypertension, benign   . Type 2 diabetes mellitus   . Coronary atherosclerosis of native coronary artery     Multivessel  . Closed fracture of right humerus     Managed conservatively 2011  . CAD (coronary artery disease) 07/11/2011  . Hx of CABG 07/11/2011  . DM type 2 (diabetes mellitus, type 2) 07/11/2011  . HTN (hypertension), malignant 07/11/2011  . Cerebrovascular disease 07/11/2011  . Seasonal allergies   . Arthritis       Vertigo  Past Surgical History  Procedure Date  . Coronary artery bypass graft 1999    LIMA to LAD, SVG to OM1 and OM2, SVG to PDA  . Cataract surgery   . Abdominal hysterectomy     WITH BSO.    Medications Prior to Admission: 1. Alprazolam 0.5 mg a half a tablet daily, when necessary for anxiety. 2. Aspirin EC 81 mg daily. 3. Vicodin 5/500 mg one tablet every 6 hours as needed for pain. 4. Lantus insulin 35 units subcutaneous daily. 5. Meclizine 12.5 mg when necessary daily for dizziness. 6. Toprol-XL 100 mg daily. 7. Oxybutynin 5 mg daily. 8. Sustained OP1 drop in both eyes daily as needed for dry eyes. 9. Pravastatin 10 mg daily. 10. Quinapril 40 mg daily.    Allergies  Allergen Reactions  . Keflex     NOT SURE OF THE REACTION, BUT WAS TOLD NOT TO TAKE IT.   Family history: The patient's father died of old age. He was 94 years of age. Her  mother died of pneumonia at 48 years of age. Her son died of a massive heart attack at 72 years of age.   Social History: She is widowed. She lives in [place]. Her only son died of a heart attack a few years ago. Her granddaughter, Alicia Mason, is present today. The patient is fairly active to be 77 years of age, she most her lawn, works in her yard, and still drives. She denies alcohol, tobacco, and illicit drug use.    ROS: Positive for arthritic pain, seasonal allergies, otherwise review of systems is negative.  PHYSICAL EXAM: Blood  pressure 182/80, pulse 79, temperature 98 F (36.7 C), temperature source Oral, resp. rate 18, height 5\' 3"  (1.6 m), weight 68.04 kg (150 lb), SpO2 100.00%.  General: The patient is currently sitting up in bed in no acute distress. HEENT: Head is normocephalic, nontraumatic. Pupils are equal round and reactive to light. Extraocular movements are intact. Conjunctivae are clear. Sclerae are white. Tympanic membranes not examined. Nasal mucosa is dry. No facial sinus tenderness. Oropharynx reveals fair dentition. Mucous membranes moist. No posterior exudates or erythema. Neck: Supple, no bruit, no thyromegaly, no JVD. Heart: Distant S1, S2, with a soft systolic murmur. Lungs: Clear to auscultation bilaterally with decreased breath sounds in the bases. Abdomen: Positive bowel sounds, soft, obese, nontender, nondistended. GU and rectal deferred. Extremities: Pedal pulses barely palpable. No pretibial edema and no pedal edema. Neurologic: The patient is alert and oriented x3. Cranial nerves II through XII are intact. Strength is 5 over 5 in the supine position. Sensation is grossly intact. Speech is clear  Basic Metabolic Panel:  Basename 07/11/11 1004  NA 141  K 3.8  CL 103  CO2 29  GLUCOSE 160*  BUN 11  CREATININE 0.68  CALCIUM 9.3  MG --  PHOS --   Liver Function Tests:  Basename 07/11/11 1004  AST 23  ALT 17  ALKPHOS 110  BILITOT 0.5  PROT 7.2  ALBUMIN 3.9   No results found for this basename: LIPASE:2,AMYLASE:2 in the last 72 hours CBC:  Basename 07/11/11 1004  WBC 6.8  NEUTROABS 4.8  HGB 13.7  HCT 39.1  MCV 91.4  PLT 179   Cardiac Enzymes: No results found for this basename: CKTOTAL:3,CKMB:3,CKMBINDEX:3,TROPONINI:3 in the last 72 hours BNP: No results found for this basename: POCBNP:3 in the last 72 hours D-Dimer: No results found for this basename: DDIMER:2 in the last 72 hours CBG: No results found for this basename: GLUCAP:6 in the last 72 hours Hemoglobin  A1C: No results found for this basename: HGBA1C in the last 72 hours Fasting Lipid Panel: No results found for this basename: CHOL,HDL,LDLCALC,TRIG,CHOLHDL,LDLDIRECT in the last 72 hours Thyroid Function Tests: No results found for this basename: TSH,T4TOTAL,FREET4,T3FREE,THYROIDAB in the last 72 hours Anemia Panel: No results found for this basename: VITAMINB12,FOLATE,FERRITIN,TIBC,IRON,RETICCTPCT in the last 72 hours Urine Drug Screen:  Urinalysis:  Misc. Labs:   EKG: Normal sinus rhythm with a heart rate of 68 beats per minute and Q waves in the anterior leads.  No results found for this or any previous visit (from the past 240 hour(s)).   Ct Head Wo Contrast  07/11/2011  *RADIOLOGY REPORT*  Clinical Data: Weakness and dizziness.  High blood pressure. Diabetes.  CT HEAD WITHOUT CONTRAST  Technique:  Contiguous axial images were obtained from the base of the skull through the vertex without contrast.  Comparison: 01/05/2004.  Findings: No intracranial hemorrhage.  No intracranial mass lesion detected on this  unenhanced exam.  Small vessel disease type changes.  Subtle hypodensity anterior aspect of the anterior limb of the right internal capsule new from prior exam.  This may be related to result of small vessel disease. Small acute infarct not entirely excluded.  No CT evidence of large acute infarct.  Vascular calcifications.  Partially empty sella incidentally noted.  Global atrophy without hydrocephalus.  IMPRESSION: Small vessel disease type changes.  Subtle hypodensity anterior aspect of the anterior limb of the right internal capsule new from prior exam.  This may be related to result of small vessel disease. Small acute infarct not entirely excluded.  No CT evidence of large acute infarct.  No intracranial hemorrhage.  Original Report Authenticated By: Fuller Canada, M.D.   Dg Chest Portable 1 View  07/11/2011  *RADIOLOGY REPORT*  Clinical Data: Weakness  PORTABLE CHEST - 1 VIEW   Comparison: None.  Findings: Lungs are clear. No pleural effusion or pneumothorax.  Cardiomegaly. Postsurgical changes related to prior CABG.  IMPRESSION: No evidence of acute cardiopulmonary disease.  Cardiomegaly.  Original Report Authenticated By: Charline Bills, M.D.    Impression:  1. Near syncope. The etiology is unclear at this time. Given that her troponin I. is elevated, although marginally so, we will consider cardiac ischemia as a potential cause. She is currently hemodynamically stable. There are no focal neurological findings.  2. Elevated troponin I., presumed to be secondary to a non-ST elevation myocardial infarction. The patient's history is notable for coronary artery disease with a multivessel CABG in 1999. According to her, she has been relatively stable from this standpoint for the past couple of years. She is currently without chest pain. However, it is not unusual for an elderly woman who is also diabetic, not to have chest pain in the setting of a subendocardial myocardial infarction.  3. Type 2 diabetes mellitus. She is to give chronically with Lantus.  4. Hypertension. It is currently malignant. She is treated with Toprol-XL 100 mg daily and quinapril daily. I do not believe she has taken these medications today yet.  5. Cerebrovascular disease per CT scan of the head.  6. History of vertigo, no vertiginous symptoms recently.   Plan: 1. I have consulted cardiologist Dr. Diona Browner.  2. We will admit her to the step down unit. 3. We will continue all of her chronic cardiac medications including metoprolol, aspirin, and quinapril. 4. We will start a heparin drip. 5. Further evaluation, we will order cardiac enzymes, TSH, fasting lipid profile, and carotid Dopplers. We will check a static vital signs.      Ralpheal Zappone 07/11/2011, 3:52 PM

## 2011-07-11 NOTE — ED Notes (Signed)
Pt reports having an episode of weakness this a.m while fixing her breakfast.  Pt reports "my legs felt like they were going to collapse".  Pt denies any dizziness, blurred vision, or loc.

## 2011-07-11 NOTE — Progress Notes (Addendum)
ANTICOAGULATION CONSULT NOTE - Initial Consult  Pharmacy Consult for Heparin Indication:  NSTEMI  Patient Measurements: Height: 5\' 3"  (160 cm) Weight: 150 lb (68.04 kg) IBW/kg (Calculated) : 52.4  Adjusted Body Weight: N/A  Vital Signs: Temp: 98 F (36.7 C) (08/21 1116) Temp src: Oral (08/21 0938) BP: 182/80 mmHg (08/21 0938) Pulse Rate: 79  (08/21 0938)  Labs:  Basename 07/11/11 1004  HGB 13.7  HCT 39.1  PLT 179  APTT --  LABPROT --  INR --  HEPARINUNFRC --  CREATININE 0.68  CRCLEARANCE --  CKTOTAL --  CKMB --  TROPONINI --    Medical History: Past Medical History  Diagnosis Date  . Essential hypertension, benign   . Type 2 diabetes mellitus   . Coronary atherosclerosis of native coronary artery     Multivessel  . Closed fracture of right humerus     Managed conservatively 2011  . CAD (coronary artery disease) 07/11/2011  . Hx of CABG 07/11/2011  . DM type 2 (diabetes mellitus, type 2) 07/11/2011  . HTN (hypertension), malignant 07/11/2011  . Cerebrovascular disease 07/11/2011  . Seasonal allergies       Assessment:  Initial anticoagulation pending full workout for NSTEMI    Goal of Therapy:   Heparin level 0.3-0.7 units/ml   Plan:  3000 units bolus Heparin drip at 950 units per hour. Heparin level in 6 hours and daily.  Alicia Mason J 07/11/2011,1:46 PM

## 2011-07-11 NOTE — ED Notes (Signed)
edp back in to reeval and discuss plan of care

## 2011-07-11 NOTE — Progress Notes (Addendum)
Discussed Heparin therapy with Dr. Onalee Hua.  Current plan is to continue IV heparin.  Heparin level currently pending.  Pharmacy consult to manage heparin re-ordered.  Alicia Mason R Heparin level = 0.94 at 950 units/hr. Decrease rate to 800 units/hr. Follow-up AM labs.

## 2011-07-11 NOTE — Progress Notes (Signed)
CRITICAL VALUE ALERT  Critical value received:  CKMB 6.1  Troponin 0.64  Date of notification: 07/11/11  Time of notification:  1944  Critical value read back:yes  Nurse who received alert:  R. Ardelle Anton, RN  MD notified (1st page):  Onalee Hua  Time of first page:  1944  MD notified (2nd page):  Time of second page:  Responding MD: Onalee Hua  Time MD responded:  [phone]

## 2011-07-12 DIAGNOSIS — I2510 Atherosclerotic heart disease of native coronary artery without angina pectoris: Secondary | ICD-10-CM

## 2011-07-12 DIAGNOSIS — R001 Bradycardia, unspecified: Secondary | ICD-10-CM | POA: Diagnosis not present

## 2011-07-12 DIAGNOSIS — I059 Rheumatic mitral valve disease, unspecified: Secondary | ICD-10-CM

## 2011-07-12 LAB — COMPREHENSIVE METABOLIC PANEL
ALT: 14 U/L (ref 0–35)
Alkaline Phosphatase: 105 U/L (ref 39–117)
BUN: 11 mg/dL (ref 6–23)
CO2: 29 mEq/L (ref 19–32)
GFR calc Af Amer: >60 mL/min (ref >60)
GFR calc non Af Amer: >60 mL/min (ref >60)
Glucose, Bld: 130 mg/dL — ABNORMAL HIGH (ref 70–99)
Potassium: 3.9 mEq/L (ref 3.5–5.1)
Sodium: 140 mEq/L (ref 135–145)
Total Bilirubin: 0.7 mg/dL (ref 0.3–1.2)

## 2011-07-12 LAB — GLUCOSE, CAPILLARY
Glucose-Capillary: 277 mg/dL — ABNORMAL HIGH (ref 70–99)
Glucose-Capillary: 249 mg/dL — ABNORMAL HIGH (ref 70–99)

## 2011-07-12 LAB — LIPID PANEL
Cholesterol: 166 mg/dL (ref 0–200)
LDL Cholesterol: 108 mg/dL — ABNORMAL HIGH (ref 0–99)
Triglycerides: 92 mg/dL (ref <150)

## 2011-07-12 LAB — CARDIAC PANEL(CRET KIN+CKTOT+MB+TROPI)
CK, MB: 5.7 ng/mL — ABNORMAL HIGH (ref 0.3–4.0)
CK, MB: 5.5 ng/mL — ABNORMAL HIGH (ref 0.3–4.0)
Relative Index: 4 — ABNORMAL HIGH (ref 0.0–2.5)
Relative Index: 4.1 — ABNORMAL HIGH (ref 0.0–2.5)
Troponin I: <0.3 ng/mL (ref <0.30)
Troponin I: <0.3 ng/mL (ref <0.30)

## 2011-07-12 LAB — CBC
MCHC: 34.3 g/dL (ref 30.0–36.0)
Platelets: 192 10*3/uL (ref 150–400)
RDW: 12.9 % (ref 11.5–15.5)
WBC: 7.5 10*3/uL (ref 4.0–10.5)

## 2011-07-12 MED ORDER — ASPIRIN EC 81 MG PO TBEC
162.0000 mg | DELAYED_RELEASE_TABLET | Freq: Every day | ORAL | Status: DC
  Administered 2011-07-12 – 2011-07-13 (×2): 162 mg via ORAL
  Filled 2011-07-12 (×2): qty 2

## 2011-07-12 MED ORDER — SODIUM CHLORIDE 0.9 % IJ SOLN
INTRAMUSCULAR | Status: AC
  Filled 2011-07-12: qty 10

## 2011-07-12 NOTE — Progress Notes (Deleted)
Pt in bathroom; wiped and found blood on toilet paper; stool was not dark/tarry; nurse checked pt's bottom and noticed small hemorrhoid

## 2011-07-12 NOTE — Progress Notes (Signed)
UR Chart Review Completed  

## 2011-07-12 NOTE — Progress Notes (Signed)
Patient clinically stable since yesterday. No concerning arrhythmias noted by telemetry. Cardiac marker trend is reassuring with completely normal troponin I levels on subsequent assessment. Elevated CK-MB noted with normal total CK levels. She has had no chest pain, ECG remains nonspecific. Would still have concerns about a neurological event, perhaps transient. We will defer workup of this to the primary team. Certainly reasonable to increase aspirin dose as noted. Would observe her clinically with increased activity. If no recurrent symptomatology, doubt that further inpatient cardiac testing will be required. Would still check an echocardiogram and perhaps an outpatient Myoview could be considered for followup of her CAD with office visit arranged.

## 2011-07-12 NOTE — Progress Notes (Signed)
SUBJECTIVE:No complaints, no chest pain, no dizziness. Wants to go home.   Filed Vitals:   07/12/11 0500 07/12/11 0600 07/12/11 0700 07/12/11 0800  BP:    150/69  Pulse: 59 58 57 67  Temp:    97.8 F (36.6 C)  TempSrc:    Oral  Resp: 17 15 15 16   Height:      Weight:  152 lb 1.9 oz (69 kg)    SpO2: 97% 96% 96% 97%    Intake/Output Summary (Last 24 hours) at 07/12/11 0839 Last data filed at 07/12/11 0600  Gross per 24 hour  Intake 346.13 ml  Output    950 ml  Net -603.87 ml    LABS: Basic Metabolic Panel:  Basename 07/12/11 0504 07/11/11 1004  NA 140 141  K 3.9 3.8  CL 103 103  CO2 29 29  GLUCOSE 130* 160*  BUN 11 11  CREATININE 0.65 0.68  CALCIUM 9.4 9.3  MG -- --  PHOS -- --   Liver Function Tests:  Basename 07/12/11 0504 07/11/11 1004  AST 21 23  ALT 14 17  ALKPHOS 105 110  BILITOT 0.7 0.5  PROT 6.6 7.2  ALBUMIN 3.5 3.9   No results found for this basename: LIPASE:2,AMYLASE:2 in the last 72 hours CBC:  Basename 07/12/11 0504 07/11/11 1004  WBC 7.5 6.8  NEUTROABS -- 4.8  HGB 13.1 13.7  HCT 38.2 39.1  MCV 92.0 91.4  PLT 192 179   Cardiac Enzymes:  Basename 07/12/11 0504 07/12/11 0200 07/11/11 1803  CKTOTAL 135 143 144  CKMB 5.5* 5.7* 6.1*  CKMBINDEX -- -- --  TROPONINI <0.30 <0.30 0.64*      Basename 07/11/11 1004  HGBA1C 6.9*   Fasting Lipid Panel: No results found for this basename: CHOL,HDL,LDLCALC,TRIG,CHOLHDL,LDLDIRECT in the last 72 hours Thyroid Function Tests:  Basename 07/11/11 1004  TSH 1.655  T4TOTAL --  T3FREE --  THYROIDAB --   Anemia Panel: No results found for this basename: VITAMINB12,FOLATE,FERRITIN,TIBC,IRON,RETICCTPCT in the last 72 hours  RADIOLOGY: Ct Head Wo Contrast  07/11/2011  *RADIOLOGY REPORT*  Clinical Data: Weakness and dizziness.  High blood pressure. Diabetes.   IMPRESSION: Small vessel disease type changes.  Subtle hypodensity anterior aspect of the anterior limb of the right internal capsule new  from prior exam.  This may be related to result of small vessel disease. Small acute infarct not entirely excluded.  No CT evidence of large acute infarct.  No intracranial hemorrhage.  Original Report Authenticated By: Fuller Canada, M.D.   US Carotid Duplex Bilateral  07/11/2011  *RADIOLOGY REPORT*  Clinical Data: Near-syncope, coronary disease post CABG, diabetes, hypertension, cerebrovascular disease, diabetes    BILATERAL CAROTID DUPLEX ULTRASOUND   IMPRESSION: Mild atherosclerotic plaque formation and bilateral carotid systems as above. No evidence of hemodynamically significant stenosis.  Original Report Authenticated By: Lollie Marrow, M.D.   Dg Chest Portable 1 View  07/11/2011  *RADIOLOGY REPORT*  Clinical Data: Weakness  PORTABLE CHEST - 1 VIEW  Comparison: None.  Findings: Lungs are clear. No pleural effusion or pneumothorax.  Cardiomegaly. Postsurgical changes related to prior CABG.  IMPRESSION: No evidence of acute cardiopulmonary disease.  Cardiomegaly.  Original Report Authenticated By: Charline Bills, M.D.    PHYSICAL EXAM General: Well developed, well nourished, in no acute distress Head: Eyes PERRLA, No xanthomas.   Normal cephalic and atramatic  Lungs: Clear bilaterally to auscultation and percussion. Heart: HRRR S1 S2,   Pulses are 2+ & equal.  No carotid bruit. No JVD.  No abdominal bruits. No femoral bruits. Abdomen: Bowel sounds are positive, abdomen soft and non-tender without masses or                  Hernia's noted. Msk:  Back normal, normal gait. Normal strength and tone for age. Extremities: No clubbing, cyanosis or edema.  DP +1 Neuro: Alert and oriented X 3. Psych:  Good affect, responds appropriately  TELEMETRY: Reviewed telemetry pt in NSR:  ASSESSMENT AND PLAN: 1. Near Syncope: She appears much better today. Ataxia response leads [place] to believe this was a primary neurologic event instead of cardiac event. Agree with moving to the telemetry  floor, increase activity and evaluate for more episodes once she is more active. 2.  CAD: Cardiac troponins are negative.  Will not plan for inpatient stress test. She will have follow-up appointment with either Dr. Daleen Squibb in [place], or Dr. Diona Browner in the [place] office, as Dr. Daleen Squibb does not come to [facility] often with a consistent office schedule. She wishes to think about this.  She would like to follow with Dr. Daleen Squibb, but will need to discuss with family concerning transportation.  Continue ASA, metoprolol and ACE. Bettey Mare. Lyman Bishop NP

## 2011-07-12 NOTE — Progress Notes (Signed)
2D Echo completed by Dewitt Hoes from [facility] on 07/12/2011 at 1200pm

## 2011-07-12 NOTE — Progress Notes (Signed)
Subjective: The patient has no complaints of dizziness, shortness of breath, or chest pain.  Objective: Vital signs in last 24 hours: Filed Vitals:   07/12/11 0500 07/12/11 0600 07/12/11 0700 07/12/11 0800  BP:    150/69  Pulse: 59 58 57 67  Temp:    97.8 F (36.6 C)  TempSrc:    Oral  Resp: 17 15 15 16   Height:      Weight:  69 kg (152 lb 1.9 oz)    SpO2: 97% 96% 96% 97%    Intake/Output Summary (Last 24 hours) at 07/12/11 0854 Last data filed at 07/12/11 0600  Gross per 24 hour  Intake 346.13 ml  Output    950 ml  Net -603.87 ml    Weight change:   General appearance: alert, cooperative and appears stated age Resp: Occasional crackles auscultated in the bases, clear anteriorly, breathing nonlabored. Cardio: Soft S1-S2 with a systolic murmur GI: Obese, positive bowel sounds, nontender, nondistended. Extremities: extremities normal, atraumatic, no cyanosis or edema Pulses: 2+ and symmetric  Lab Results: Basic Metabolic Panel:  Basename 07/12/11 0504 07/11/11 1004  NA 140 141  K 3.9 3.8  CL 103 103  CO2 29 29  GLUCOSE 130* 160*  BUN 11 11  CREATININE 0.65 0.68  CALCIUM 9.4 9.3  MG -- --  PHOS -- --   Liver Function Tests:  Basename 07/12/11 0504 07/11/11 1004  AST 21 23  ALT 14 17  ALKPHOS 105 110  BILITOT 0.7 0.5  PROT 6.6 7.2  ALBUMIN 3.5 3.9   No results found for this basename: LIPASE:2,AMYLASE:2 in the last 72 hours CBC:  Basename 07/12/11 0504 07/11/11 1004  WBC 7.5 6.8  NEUTROABS -- 4.8  HGB 13.1 13.7  HCT 38.2 39.1  MCV 92.0 91.4  PLT 192 179   Cardiac Enzymes:  Basename 07/12/11 0504 07/12/11 0200 07/11/11 1803  CKTOTAL 135 143 144  CKMB 5.5* 5.7* 6.1*  CKMBINDEX -- -- --  TROPONINI <0.30 <0.30 0.64*   BNP: No results found for this basename: POCBNP:3 in the last 72 hours D-Dimer: No results found for this basename: DDIMER:2 in the last 72 hours CBG:  Basename 07/12/11 0732 07/11/11 2142 07/11/11 1618 07/11/11 0958  GLUCAP  107* 208* 228* 161*   Hemoglobin A1C:  Basename 07/11/11 1004  HGBA1C 6.9*   Fasting Lipid Panel: No results found for this basename: CHOL,HDL,LDLCALC,TRIG,CHOLHDL,LDLDIRECT in the last 72 hours Thyroid Function Tests:  Basename 07/11/11 1004  TSH 1.655  T4TOTAL --  FREET4 1.01  T3FREE --  THYROIDAB --   Anemia Panel: No results found for this basename: VITAMINB12,FOLATE,FERRITIN,TIBC,IRON,RETICCTPCT in the last 72 hours Urine Drug Screen:  Urinalysis: Small leukocytes, 0-2 WBCs, 3-6 RBCs, few squamous cells. Misc. Labs: CK-MB 5.5 CK 135 troponin I less than 0.30 CK-MB 5.7 CK 143 troponin I less than 0.30 CK-MB 6.1 CK 144 troponin I 0.64 Micro: Recent Results (from the past 240 hour(s))  MRSA PCR SCREENING     Status: Normal   Collection Time   07/11/11  3:31 PM      Component Value Range Status Comment   MRSA by PCR NEGATIVE  NEGATIVE  Final     Studies/Results: Ct Head Wo Contrast  07/11/2011  *RADIOLOGY REPORT*  Clinical Data: Weakness and dizziness.  High blood pressure. Diabetes.  CT HEAD WITHOUT CONTRAST  Technique:  Contiguous axial images were obtained from the base of the skull through the vertex without contrast.  Comparison: 01/05/2004.  Findings: No intracranial  hemorrhage.  No intracranial mass lesion detected on this unenhanced exam.  Small vessel disease type changes.  Subtle hypodensity anterior aspect of the anterior limb of the right internal capsule new from prior exam.  This may be related to result of small vessel disease. Small acute infarct not entirely excluded.  No CT evidence of large acute infarct.  Vascular calcifications.  Partially empty sella incidentally noted.  Global atrophy without hydrocephalus.  IMPRESSION: Small vessel disease type changes.  Subtle hypodensity anterior aspect of the anterior limb of the right internal capsule new from prior exam.  This may be related to result of small vessel disease. Small acute infarct not entirely  excluded.  No CT evidence of large acute infarct.  No intracranial hemorrhage.  Original Report Authenticated By: Fuller Canada, M.D.   US Carotid Duplex Bilateral  07/11/2011  *RADIOLOGY REPORT*  Clinical Data: Near-syncope, coronary disease post CABG, diabetes, hypertension, cerebrovascular disease, diabetes  BILATERAL CAROTID DUPLEX ULTRASOUND  Technique: Gray scale imaging, color Doppler and duplex ultrasound was performed of bilateral carotid and vertebral arteries in the neck.  Comparison:  None.  Criteria:  Quantification of carotid stenosis is based on velocity parameters that correlate the residual internal carotid diameter with NASCET-based stenosis levels, using the diameter of the distal internal carotid lumen as the denominator for stenosis measurement.  The following velocity measurements were obtained:                   PEAK SYSTOLIC/END DIASTOLIC RIGHT ICA:                        73/18cm/sec CCA:                        63/9cm/sec SYSTOLIC ICA/CCA RATIO:     1.15 DIASTOLIC ICA/CCA RATIO:    2.06 ECA:                        150cm/sec  LEFT ICA:                        87/12cm/sec CCA:                        78/10cm/sec SYSTOLIC ICA/CCA RATIO:     1.13 DIASTOLIC ICA/CCA RATIO:    1.20 ECA:                        206cm/sec  Findings:  RIGHT CAROTID ARTERY: Mild intimal thickening and right CCA. Plaque identified distal right CCA, including both hypoechoic and echogenic plaque, some of which at the bulb demonstrates mild posterior shadowing.  Plaque extends into the origins of the right ICA and ECA.  Minimally turbulent blood flow on color Doppler imaging in the right ECA. Spectral broadening distal right ICA on waveform analysis.  No high velocity jets in the right ICA.  Mildly elevated peak systolic velocity in right ECA.  RIGHT VERTEBRAL ARTERY:  Patent, antegrade  LEFT CAROTID ARTERY: Minimal plaque and intimal thickening left CCA.  Mild plaque left carotid bulb into proximal left ECA and ICA.  Portion of plaque at proximal left ECA is calcified and shadowing. Portion of left ICA plaque is echogenic.  Laminar flow by color Doppler imaging.  Spectral broadening on waveform analysis in the left ICA. Elevated peak systolic velocity left ECA.  LEFT VERTEBRAL  ARTERY:  Patent, antegrade  IMPRESSION: Mild atherosclerotic plaque formation and bilateral carotid systems as above. No evidence of hemodynamically significant stenosis.  Original Report Authenticated By: Lollie Marrow, M.D.   Dg Chest Portable 1 View  07/11/2011  *RADIOLOGY REPORT*  Clinical Data: Weakness  PORTABLE CHEST - 1 VIEW  Comparison: None.  Findings: Lungs are clear. No pleural effusion or pneumothorax.  Cardiomegaly. Postsurgical changes related to prior CABG.  IMPRESSION: No evidence of acute cardiopulmonary disease.  Cardiomegaly.  Original Report Authenticated By: Charline Bills, M.D.    Medications: I have reviewed the patient's current medications.  Assessment:   1. Near syncope. Etiology is unclear at this time. Possible transient cardiac ischemia causing symptoms versus vasovagal effects. Also consider cardiac arrhythmia. She has no neurological, focal deficits and, therefore, an MRI of her brain would probably not change management significantly. The dose of aspirin, however will be increased to 162 mg for noted cerebrovascular disease and history of coronary artery disease.  2. Positive troponin I., in the setting of known history of coronary artery disease, consistent with non-ST elevation myocardial infarction versus other. Her troponin I. it has normalized. Recommendations per cardiology.  3. Mild bradycardia, likely secondary to beta blocker. Her TSH and free T4 are within normal limits. Next  4. Type 2 diabetes mellitus. Her capillary blood glucose this morning is reasonable. Her hemoglobin A1c is 6.9.  5. Hypertension. She is treated chronically with quinapril and Toprol-XL. Her blood pressure is much better  today, however, it is not necessarily optimal. We will monitor her blood pressure more today before deciding on adding another antihypertensive medication.    Plan: We will discontinue the heparin drip. We will increase her aspirin to 162 mg daily. We will transfer her out of the ICU to a telemetry bed. We'll consult PT. We'll followup on the recommendations of cardiology. We will check orthostatic vital signs. Fasting lipid profile ordered and results are pending.   LOS: 1 day   Emmalene Kattner 07/12/2011, 8:54 AM

## 2011-07-12 NOTE — Progress Notes (Signed)
*  PRELIMINARY RESULTS* Echocardiogram 2D Echocardiogram has been performed.  Alicia Mason 07/12/2011, 3:28 PM

## 2011-07-12 NOTE — Plan of Care (Signed)
Problem: Consults Goal: Diabetes Guidelines if Diabetic/Glucose > 140 If diabetic or lab glucose is > 140 mg/dl - Initiate Diabetes/Hyperglycemia Guidelines & Document Interventions  Outcome: Progressing Path reviewed and patient assessed.  New orders last pm for Lantus.  Will continue to monitor.

## 2011-07-13 ENCOUNTER — Encounter (HOSPITAL_COMMUNITY): Payer: Self-pay | Admitting: Cardiology

## 2011-07-13 DIAGNOSIS — I255 Ischemic cardiomyopathy: Secondary | ICD-10-CM | POA: Diagnosis present

## 2011-07-13 LAB — GLUCOSE, CAPILLARY: Glucose-Capillary: 112 mg/dL — ABNORMAL HIGH (ref 70–99)

## 2011-07-13 MED ORDER — HYDROCHLOROTHIAZIDE 12.5 MG PO CAPS: 12.5000 mg | ORAL_CAPSULE | Freq: Every day | ORAL | Status: DC

## 2011-07-13 MED ORDER — ASPIRIN 162 MG PO TBEC: 162.0000 mg | DELAYED_RELEASE_TABLET | Freq: Every day | ORAL | Status: AC

## 2011-07-13 MED ORDER — METOPROLOL SUCCINATE ER 50 MG PO TB24: 50.0000 mg | ORAL_TABLET | Freq: Every day | ORAL | Status: DC

## 2011-07-13 MED ORDER — METOPROLOL SUCCINATE ER 50 MG PO TB24
50.0000 mg | ORAL_TABLET | Freq: Every day | ORAL | Status: DC
  Administered 2011-07-13: 50 mg via ORAL

## 2011-07-13 MED ORDER — HYDROCHLOROTHIAZIDE 12.5 MG PO CAPS
12.5000 mg | ORAL_CAPSULE | Freq: Every day | ORAL | Status: DC
  Administered 2011-07-13: 12.5 mg via ORAL

## 2011-07-13 MED ORDER — METOPROLOL SUCCINATE ER 50 MG PO TB24
100.0000 mg | ORAL_TABLET | Freq: Every day | ORAL | Status: DC
  Filled 2011-07-13: qty 1

## 2011-07-13 MED ORDER — HYDROCHLOROTHIAZIDE 12.5 MG PO CAPS
ORAL_CAPSULE | ORAL | Status: AC
  Filled 2011-07-13: qty 1

## 2011-07-13 MED ORDER — NITROGLYCERIN 0.4 MG SL SUBL: 0.4000 mg | SUBLINGUAL_TABLET | SUBLINGUAL | Status: DC | PRN

## 2011-07-13 NOTE — Discharge Summary (Signed)
Physician Discharge Summary  Alicia Mason MRN: [id number] DOB/AGE: October 25, 1922 77 y.o.  PCP: [facility]   Admit date: 07/11/2011 Discharge date: 07/13/2011  Discharge Diagnoses:  1. Near-syncope. Etiology not clearly elucidated. Transient cardiac ischemia and transient neurological event were considered. 2. Elevated troponin I., treated as non-ST elevation myocardial infarction. 3. Ischemic cardiomyopathy and diastolic dysfunction. Ejection fraction 40-45% per 2-D echocardiogram 07/12/2011. 4. History of coronary artery disease and previous CABG. 5. Cerebrovascular disease, noted on the CT scan of her head. 6. Type 2 diabetes mellitus. Hemoglobin A1c 6.9. 7. Hyperlipidemia. Total cholesterol 166, triglycerides 92, HDL cholesterol 40, and LDL cholesterol 108. 8. Sinus bradycardia. 9. History of vertigo, not clinically evident during the hospitalization.  Current Discharge Medication List    START taking these medications   Details  hydrochlorothiazide (,MICROZIDE/HYDRODIURIL,) 12.5 MG capsule Take 1 capsule (12.5 mg total) by mouth daily. Qty: 30 capsule, Refills: 2    nitroGLYCERIN (NITROSTAT) 0.4 MG SL tablet Place 1 tablet (0.4 mg total) under the tongue every 5 (five) minutes as needed for chest pain. Qty: 30 tablet, Refills: 2      CONTINUE these medications which have CHANGED   Details  aspirin EC 162 MG EC tablet Take 1 tablet (162 mg total) by mouth daily.    metoprolol (TOPROL-XL) 50 MG 24 hr tablet Take 1 tablet (50 mg total) by mouth daily. Qty: 30 tablet, Refills: 2      CONTINUE these medications which have NOT CHANGED   Details  ALPRAZolam (XANAX) 0.5 MG tablet Take 0.25 mg by mouth daily as needed. Anxiety     HYDROcodone-acetaminophen (VICODIN) 5-500 MG per tablet Take 1 tablet by mouth every 6 (six) hours as needed. Pain     insulin glargine (LANTUS) 100 UNIT/ML injection Inject 35 Units into the skin daily.      meclizine (ANTIVERT) 12.5 MG  tablet Take 12.5 mg by mouth daily as needed. Motion sickness     oxybutynin (DITROPAN) 5 MG tablet Take 5 mg by mouth daily.      Polyethyl Glycol-Propyl Glycol (SYSTANE OP) Place 1 drop into both eyes daily as needed. Dry Eyes     pravastatin (PRAVACHOL) 10 MG tablet Take 10 mg by mouth daily.      quinapril (ACCUPRIL) 40 MG tablet Take 40 mg by mouth daily.          Discharge Condition: Stable and improved.  Disposition: Home.    Consults: Dr. Nona Dell.   Significant Diagnostic Studies: Ct Head Wo Contrast  07/11/2011  *RADIOLOGY REPORT*  Clinical Data: Weakness and dizziness.  High blood pressure. Diabetes.  CT HEAD WITHOUT CONTRAST  Technique:  Contiguous axial images were obtained from the base of the skull through the vertex without contrast.  Comparison: 01/05/2004.  Findings: No intracranial hemorrhage.  No intracranial mass lesion detected on this unenhanced exam.  Small vessel disease type changes.  Subtle hypodensity anterior aspect of the anterior limb of the right internal capsule new from prior exam.  This may be related to result of small vessel disease. Small acute infarct not entirely excluded.  No CT evidence of large acute infarct.  Vascular calcifications.  Partially empty sella incidentally noted.  Global atrophy without hydrocephalus.  IMPRESSION: Small vessel disease type changes.  Subtle hypodensity anterior aspect of the anterior limb of the right internal capsule new from prior exam.  This may be related to result of small vessel disease. Small acute infarct not entirely excluded.  No  CT evidence of large acute infarct.  No intracranial hemorrhage.  Original Report Authenticated By: Fuller Canada, M.D.   US Carotid Duplex Bilateral  07/11/2011  *RADIOLOGY REPORT*  Clinical Data: Near-syncope, coronary disease post CABG, diabetes, hypertension, cerebrovascular disease, diabetes  BILATERAL CAROTID DUPLEX ULTRASOUND  Technique: Gray scale imaging, color  Doppler and duplex ultrasound was performed of bilateral carotid and vertebral arteries in the neck.  Comparison:  None.  Criteria:  Quantification of carotid stenosis is based on velocity parameters that correlate the residual internal carotid diameter with NASCET-based stenosis levels, using the diameter of the distal internal carotid lumen as the denominator for stenosis measurement.  The following velocity measurements were obtained:                   PEAK SYSTOLIC/END DIASTOLIC RIGHT ICA:                        73/18cm/sec CCA:                        63/9cm/sec SYSTOLIC ICA/CCA RATIO:     1.15 DIASTOLIC ICA/CCA RATIO:    2.06 ECA:                        150cm/sec  LEFT ICA:                        87/12cm/sec CCA:                        78/10cm/sec SYSTOLIC ICA/CCA RATIO:     1.13 DIASTOLIC ICA/CCA RATIO:    1.20 ECA:                        206cm/sec  Findings:  RIGHT CAROTID ARTERY: Mild intimal thickening and right CCA. Plaque identified distal right CCA, including both hypoechoic and echogenic plaque, some of which at the bulb demonstrates mild posterior shadowing.  Plaque extends into the origins of the right ICA and ECA.  Minimally turbulent blood flow on color Doppler imaging in the right ECA. Spectral broadening distal right ICA on waveform analysis.  No high velocity jets in the right ICA.  Mildly elevated peak systolic velocity in right ECA.  RIGHT VERTEBRAL ARTERY:  Patent, antegrade  LEFT CAROTID ARTERY: Minimal plaque and intimal thickening left CCA.  Mild plaque left carotid bulb into proximal left ECA and ICA. Portion of plaque at proximal left ECA is calcified and shadowing. Portion of left ICA plaque is echogenic.  Laminar flow by color Doppler imaging.  Spectral broadening on waveform analysis in the left ICA. Elevated peak systolic velocity left ECA.  LEFT VERTEBRAL ARTERY:  Patent, antegrade  IMPRESSION: Mild atherosclerotic plaque formation and bilateral carotid systems as above. No evidence  of hemodynamically significant stenosis.  Original Report Authenticated By: Lollie Marrow, M.D.   Dg Chest Portable 1 View  07/11/2011  *RADIOLOGY REPORT*  Clinical Data: Weakness  PORTABLE CHEST - 1 VIEW  Comparison: None.  Findings: Lungs are clear. No pleural effusion or pneumothorax.  Cardiomegaly. Postsurgical changes related to prior CABG.  IMPRESSION: No evidence of acute cardiopulmonary disease.  Cardiomegaly.  Original Report Authenticated By: Charline Bills, M.D.   ECHO: Transthoracic Echocardiography  Patient: Jakala, Herford MR #: [id number] Study Date: 07/12/2011 Gender: F Age: 5 Height: 162.6cm Weight: 70.3kg BSA: 1.46m^2  Pt. Status: Room: IC03  ADMITTING Cathlean Sauer, Samara Deist PERFORMING Delma Freeze Penn ATTENDING Zadie Rhine SONOGRAPHER Kwong(Billy) Dory Peru, RDCS cc:  -------------------------------------------------------------------- LV EF: 40% - 45%  -------------------------------------------------------------------- Indications: CAD of native vessls - nonobstructive 414.01.  -------------------------------------------------------------------- History: PMH: Atrial fibrillation. Bradycardia. Coronary artery disease. Stroke. Risk factors: GERD. Osteoarthritis. Humerus fracture. Hypertension. Diabetes mellitus.  -------------------------------------------------------------------- Study Conclusions  - Left ventricle: Wall thickness was increased in a pattern of moderate LVH. Systolic function was mildly to moderately reduced. The estimated ejection fraction was in the range of 40% to 45%. There is hypokinesis of the inferolateral myocardium. Doppler parameters are consistent with abnormal left ventricular relaxation (grade 1 diastolic dysfunction). - Aortic valve: Mildly calcified annulus. Mildly calcified leaflets. Mild regurgitation. - Mitral valve: Calcified annulus. Mildly thickened leaflets . Mild to moderate  regurgitation directed eccentrically. - Left atrium: The atrium was mildly dilated. - Tricuspid valve: Mild regurgitation. - Pericardium, extracardiac: There was no pericardial effusion.      Microbiology: Recent Results (from the past 240 hour(s))  MRSA PCR SCREENING     Status: Normal   Collection Time   07/11/11  3:31 PM      Component Value Range Status Comment   MRSA by PCR NEGATIVE  NEGATIVE  Final      Labs: Results for orders placed during the hospital encounter of 07/11/11 (from the past 48 hour(s))  MRSA PCR SCREENING     Status: Normal   Collection Time   07/11/11  3:31 PM      Component Value Range Comment   MRSA by PCR NEGATIVE  NEGATIVE    GLUCOSE, CAPILLARY     Status: Abnormal   Collection Time   07/11/11  4:18 PM      Component Value Range Comment   Glucose-Capillary 228 (*) 70 - 99 (mg/dL)    Comment 1 Documented in Chart      Comment 2 Notify RN     CARDIAC PANEL(CRET KIN+CKTOT+MB+TROPI)     Status: Abnormal   Collection Time   07/11/11  6:03 PM      Component Value Range Comment   Total CK 144  7 - 177 (U/L)    CK, MB 6.1 (*) 0.3 - 4.0 (ng/mL)    Troponin I 0.64 (*) <0.30 (ng/mL)    Relative Index 4.2 (*) 0.0 - 2.5    HEPARIN LEVEL     Status: Abnormal   Collection Time   07/11/11  7:58 PM      Component Value Range Comment   Heparin Unfractionated 0.94 (*) 0.30 - 0.70 (IU/mL)   GLUCOSE, CAPILLARY     Status: Abnormal   Collection Time   07/11/11  9:42 PM      Component Value Range Comment   Glucose-Capillary 208 (*) 70 - 99 (mg/dL)   CARDIAC PANEL(CRET KIN+CKTOT+MB+TROPI)     Status: Abnormal   Collection Time   07/12/11  2:00 AM      Component Value Range Comment   Total CK 143  7 - 177 (U/L)    CK, MB 5.7 (*) 0.3 - 4.0 (ng/mL)    Troponin I <0.30  <0.30 (ng/mL)    Relative Index 4.0 (*) 0.0 - 2.5    COMPREHENSIVE METABOLIC PANEL     Status: Abnormal   Collection Time   07/12/11  5:04 AM      Component Value Range Comment   Sodium 140  135 -  145 (mEq/L)    Potassium 3.9  3.5 -  5.1 (mEq/L)    Chloride 103  96 - 112 (mEq/L)    CO2 29  19 - 32 (mEq/L)    Glucose, Bld 130 (*) 70 - 99 (mg/dL)    BUN 11  6 - 23 (mg/dL)    Creatinine, Ser 1.61  0.50 - 1.10 (mg/dL)    Calcium 9.4  8.4 - 10.5 (mg/dL)    Total Protein 6.6  6.0 - 8.3 (g/dL)    Albumin 3.5  3.5 - 5.2 (g/dL)    AST 21  0 - 37 (U/L)    ALT 14  0 - 35 (U/L)    Alkaline Phosphatase 105  39 - 117 (U/L)    Total Bilirubin 0.7  0.3 - 1.2 (mg/dL)    GFR calc non Af Amer >60  >60 (mL/min)    GFR calc Af Amer >60  >60 (mL/min)   CBC     Status: Normal   Collection Time   07/12/11  5:04 AM      Component Value Range Comment   WBC 7.5  4.0 - 10.5 (K/uL)    RBC 4.15  3.87 - 5.11 (MIL/uL)    Hemoglobin 13.1  12.0 - 15.0 (g/dL)    HCT 09.6  04.5 - 40.9 (%)    MCV 92.0  78.0 - 100.0 (fL)    MCH 31.6  26.0 - 34.0 (pg)    MCHC 34.3  30.0 - 36.0 (g/dL)    RDW 81.1  91.4 - 78.2 (%)    Platelets 192  150 - 400 (K/uL)   LIPID PANEL     Status: Abnormal   Collection Time   07/12/11  5:04 AM      Component Value Range Comment   Cholesterol 166  0 - 200 (mg/dL)    Triglycerides 92  <956 (mg/dL)    HDL 40  >21 (mg/dL)    Total CHOL/HDL Ratio 4.2      VLDL 18  0 - 40 (mg/dL)    LDL Cholesterol 308 (*) 0 - 99 (mg/dL)   HEPARIN LEVEL     Status: Normal   Collection Time   07/12/11  5:04 AM      Component Value Range Comment   Heparin Unfractionated 0.66  0.30 - 0.70 (IU/mL)   CARDIAC PANEL(CRET KIN+CKTOT+MB+TROPI)     Status: Abnormal   Collection Time   07/12/11  5:04 AM      Component Value Range Comment   Total CK 135  7 - 177 (U/L)    CK, MB 5.5 (*) 0.3 - 4.0 (ng/mL)    Troponin I <0.30  <0.30 (ng/mL)    Relative Index 4.1 (*) 0.0 - 2.5    GLUCOSE, CAPILLARY     Status: Abnormal   Collection Time   07/12/11  7:32 AM      Component Value Range Comment   Glucose-Capillary 107 (*) 70 - 99 (mg/dL)    Comment 1 Notify RN      Comment 2 Documented in Chart     CARDIAC  PANEL(CRET KIN+CKTOT+MB+TROPI)     Status: Abnormal   Collection Time   07/12/11  9:43 AM      Component Value Range Comment   Total CK 145  7 - 177 (U/L)    CK, MB 6.0 (*) 0.3 - 4.0 (ng/mL)    Troponin I <0.30  <0.30 (ng/mL)    Relative Index 4.1 (*) 0.0 - 2.5    GLUCOSE, CAPILLARY     Status: Abnormal  Collection Time   07/12/11 11:50 AM      Component Value Range Comment   Glucose-Capillary 176 (*) 70 - 99 (mg/dL)    Comment 1 Notify RN      Comment 2 Documented in Chart     GLUCOSE, CAPILLARY     Status: Abnormal   Collection Time   07/12/11  5:07 PM      Component Value Range Comment   Glucose-Capillary 277 (*) 70 - 99 (mg/dL)   GLUCOSE, CAPILLARY     Status: Abnormal   Collection Time   07/12/11  8:56 PM      Component Value Range Comment   Glucose-Capillary 249 (*) 70 - 99 (mg/dL)    Comment 1 Notify RN      Comment 2 Documented in Chart     GLUCOSE, CAPILLARY     Status: Abnormal   Collection Time   07/13/11  7:21 AM      Component Value Range Comment   Glucose-Capillary 112 (*) 70 - 99 (mg/dL)   GLUCOSE, CAPILLARY     Status: Abnormal   Collection Time   07/13/11 12:02 PM      Component Value Range Comment   Glucose-Capillary 225 (*) 70 - 99 (mg/dL)      HPI : The patient is an 77 year old woman with a past medical history significant for coronary artery disease, status post CABG in 1999, type 2 diabetes mellitus, and hypertension. She presented to the emergency department on 07/11/2011 with a chief complaint of weakness and a feeling as if she was going to pass out. In the emergency department, she was noted to be hypertensive with a blood pressure 182/80. Her blood glucose was 160. Her chest x-ray revealed cardiomegaly but no acute cardiopulmonary findings. The CT scan of her head revealed ischemic type small vessel changes but no obvious acute infarct. Her EKG revealed a heart rate of 68 beats per minute and Q waves in the anterior leads. Her troponin I was slightly  elevated at 0.19. She was admitted for further evaluation and management.  HOSPITAL COURSE:  During the initial assessment, I discussed the patient with cardiologist Dr. Diona Browner. It was decided to keep the patient here at [facility] for further evaluation and management. She was started on all of her chronic cardiac medications including quinapril, metoprolol extended release, aspirin, and pravastatin. The dose, however, of aspirin was increased to 162. IV heparin was started for a tentative diagnosis of non-ST elevation myocardial infarction. She had no complaints of chest pain, and therefore, nitroglycerin drip was not ordered.  For further evaluation, a number of studies were ordered. Her followup troponin I. increased to 0.64. It eventually normalized x3 at less than 0.30. Her CK remained within normal limits. The results of her fasting lipid profile were dictated above. Her hemoglobin A1c was reasonable at 6.9. Her TSH was within normal limits at 1.65. Her followup EKG was unchanged. Her urinalysis was not indicative of infection. Her carotid ultrasound revealed no significant ICA stenosis. Her orthostatic vital signs did not reveal significant orthostatic hypotension.   Dr. Diona Browner, cardiologist, was consulted. He ordered a 2-D echocardiogram for further evaluation. The results were dictated above. Following the results, he recommended that the patient be evaluated further in the outpatient setting with a Lexiscan stress test.  The patient was noted to be bradycardic in the morning. It was unclear whether or not she would be clinically symptomatic. Nevertheless, the dose of Toprol XL was decreased from 100  mg each morning to 50 mg daily. Because her blood pressure was not well-controlled, hydrochlorothiazide was added in blue of the decrease in the dose of metoprolol XL. She remained on the same dose of quinapril.  She ambulated with the physical therapist prior to discharge. There was no  evidence of ataxia, weakness, or presyncope. Further CNS evaluation was considered, however, the patient had no focal weakness, no cranial nerve deficits, and no obvious findings consistent with an acute cerebrovascular event. Her glycemic control was reasonable. She was maintained on Lantus.    Discharge Exam:  Blood pressure 175/79, pulse 56, temperature 97.5 F (36.4 C), temperature source Oral, resp. rate 20, height 5\' 3"  (1.6 m), weight 69 kg (152 lb 1.9 oz), SpO2 99.00%.  General: The patient is currently sitting up in the chair eating lunch. No acute distress. Lungs: Rare crackles auscultated. Breathing is nonlabored. Heart: S1-S2 with a soft systolic murmur Abdomen: Positive bowel sounds, obese, nontender, nondistended. Extremities: No pedal edema. Neurologic: Alert and oriented x3. Cranial nerves II through XII intact.   Discharge Orders    Future Appointments: Provider: Department: Dept Phone: Center:   07/25/2011 12:00 PM Tammy Ileene Patrick, RN [facility] [phone] [facility]   07/31/2011 1:00 PM Joni Reining, NP [facility] [phone] [id number]     Future Orders Please Complete By Expires   Diet - low sodium heart healthy      Diet Carb Modified      Increase activity slowly      Discharge instructions      Comments:   STAND SLOWLY. LIGHT HOUSEWORK IS PERMITTED.      Follow-up Information    Follow up with MANN,BENJAMIN L, PA on 07/20/2011. (BE THERE AT 2:00 PM)    Contact information:   [address] [place] [id number]       Follow up with RADIOLOGY on 07/25/2011. (9:15AM. Nothing to eat after midnight., the night before. You can take your meds with  sips of water.)       Follow up with Joni Reining, NP on 07/31/2011. (BE THERE AT 1:00 PM)    Contact information:   [address] [place] [address] [phone]          Signed: Naiomi Musto 07/13/2011,  12:56 PM

## 2011-07-13 NOTE — Patient Instructions (Signed)
Was instructed to pause with each change in position in order to allow BP to accommodate.  She understands teaching

## 2011-07-13 NOTE — Progress Notes (Signed)
Physical Therapy Evaluation Patient Name: Alicia Mason Date: 07/13/2011 Problem List:  Patient Active Problem List  Diagnoses  . IDDM  . HYPERTENSION  . CAD  . ATRIAL FIBRILLATION  . GERD  . OSTEOARTHRITIS  . FRACTURE, HUMERUS, PROXIMAL  . Near syncope  . NSTEMI (non-ST elevated myocardial infarction)  . CAD (coronary artery disease)  . Hx of CABG  . DM type 2 (diabetes mellitus, type 2)  . HTN (hypertension), malignant  . Cerebrovascular disease  . Vertigo  . Bradycardia   Past Medical History:  Past Medical History  Diagnosis Date  . Essential hypertension, benign   . Type 2 diabetes mellitus   . Coronary atherosclerosis of native coronary artery     Multivessel  . Closed fracture of right humerus     Managed conservatively 2011  . Cerebrovascular disease   . Seasonal allergies   . Arthritis   . Vertigo    Past Surgical History:  Past Surgical History  Procedure Date  . Coronary artery bypass graft 1999    LIMA to LAD, SVG to OM1 and OM2, SVG to PDA  . Cataract surgery   . Abdominal hysterectomy     WITH BSO.    Precautions/Restrictions  Precautions Precautions: Fall Required Braces or Orthoses: No Restrictions Weight Bearing Restrictions: No Prior Functioning  Home Living Type of Home: House Lives With: Alone Receives Help From: Neighbor;Family Home Layout: One level Home Access: Stairs to enter Entrance Stairs-Rails: None Entrance Stairs-Number of Steps: 1 Bathroom Shower/Tub: Engineer, manufacturing systems: Standard Bathroom Accessibility: Yes How Accessible: Accessible via walker Home Adaptive Equipment: Walker - rolling;Quad cane Prior Function Level of Independence: Independent with basic ADLs;Independent with homemaking with ambulation;Independent with gait;Independent with transfers;Requires assistive device for independence Driving: Yes Vocation: Retired Financial risk analyst Arousal/Alertness: Awake/alert Overall Cognitive  Status: Appears within functional limits for tasks assessed Orientation Level: Oriented X4 Sensation/Coordination Sensation Light Touch: Appears Intact Proprioception: Appears Intact Extremity Assessment LUE Assessment LUE Assessment: Within Functional Limits RLE Assessment RLE Assessment: Within Functional Limits LLE Assessment LLE Assessment: Within Functional Limits Mobility (including Balance) Bed Mobility Bed Mobility:  (independent) Transfers Transfers:  (independent) Ambulation/Gait Ambulation/Gait: Yes Ambulation/Gait Assistance: 6: Modified independent (Device/Increase time) Ambulation Distance (Feet): 300 Feet (on level and ramp) Assistive device: Small based quad cane Gait Pattern: Within Functional Limits Stairs: No Wheelchair Mobility Wheelchair Mobility: No  Posture/Postural Control Posture/Postural Control: No significant limitations Balance Balance Assessed:  (WNL) Exercise     End of Session PT - End of Session Equipment Utilized During Treatment: Gait belt Activity Tolerance: Patient tolerated treatment well Patient left: in bed;with call bell in reach Nurse Communication: Mobility status for transfers;Mobility status for ambulation General Behavior During Session: [facility] for tasks performed Cognition: [facility] for tasks performed PT Assessment/Plan/Recommendation PT Assessment Clinical Impression Statement: pt at functional baseline-all safety issues at home were discussed and pt. seems to have covered all of the bases PT Recommendation/Assessment: Patent does not need any further PT services No Skilled PT: Patient at baseline level of functioning PT Goals    Konrad Penta 07/13/2011, 11:48 AM

## 2011-07-13 NOTE — Progress Notes (Signed)
Subjective: No chest pain or dizziness. She has ambulated in her room, and recently in the hall with physical therapy, doing well. Eager to go home.   Objective: Temp:  [97.4 F (36.3 C)-98.4 F (36.9 C)] 97.5 F (36.4 C) (08/23 0656) Pulse Rate:  [56-88] 56  (08/23 0656) Resp:  [12-20] 20  (08/23 0656) BP: (117-175)/(66-79) 175/79 mmHg (08/23 0656) SpO2:  [95 %-100 %] 99 % (08/23 0656)  I/O last 3 completed shifts: In: 1120.1 [P.O.:990; I.V.:130.1] Out: 2370 [Urine:2370]   General - No acute distress. HEENT - Oropharynx with moist mucosa. Lungs - Clear to auscultation, nonlabored. Cardiac - Regular rate and rhythm, soft systolic murmur at base, no S3. Abdomen - NABS. Extremities - No pitting.  Lab Results  Component Value Date   CREATININE 0.65 07/12/2011   BUN 11 07/12/2011   NA 140 07/12/2011   K 3.9 07/12/2011   CL 103 07/12/2011   CO2 29 07/12/2011   Lab Results  Component Value Date   CKTOTAL 145 07/12/2011   CKMB 6.0* 07/12/2011   TROPONINI <0.30 07/12/2011   Lab Results  Component Value Date   WBC 7.5 07/12/2011   HGB 13.1 07/12/2011   HCT 38.2 07/12/2011   MCV 92.0 07/12/2011   PLT 192 07/12/2011     Assessment:  1. Presentation with episode of weakness, possibly ataxia, etiology not entirely clear, however possibility of a transient CNS event is to be considered. Head CT was abnormal as noted. Initial troponin I was mildly increased, however subsequently normal, and ECG has been nonspecific. While she has been somewhat bradycardic on telemetry monitoring at times, she has not been clearly symptomatic with this, as has tolerated ambulation well. No pauses or tachycardia arrhythmias were documented.  2. Coronary artery disease - known multivessel, LVEF approximately 40-45% by followup echocardiogram. This has been managed medically over time by Dr. Daleen Squibb.  3.  Hyperlipidemia - on statin therapy.  4.  Bradycardia - not entirely clear if symptomatic or  not.   Plan:  Discussed with patient and daughter in detail. Plan will be to continue medical therapy, which includes a reduction in metoprolol to 50 mg daily given bradycardia and the addition of HCTZ for better blood pressure control. She will otherwise continue aspirin, simvastatin, Prinivil, and has nitroglycerin for PRN use. We are scheduling an outpatient Lexiscan Myoview on medical therapy to assess ischemic burden, and hopefully help determine whether medical therapy and observation can be continued, versus consideration for invasive testing, partcularly if any symptoms progress. Followup will be arranged with Ms. Lawrence in Dr. Vern Claude absence.

## 2011-07-20 ENCOUNTER — Other Ambulatory Visit: Payer: Self-pay | Admitting: Cardiology

## 2011-07-20 DIAGNOSIS — I2510 Atherosclerotic heart disease of native coronary artery without angina pectoris: Secondary | ICD-10-CM

## 2011-07-25 ENCOUNTER — Encounter: Payer: Medicare Other | Admitting: *Deleted

## 2011-07-25 ENCOUNTER — Encounter (HOSPITAL_COMMUNITY): Payer: Medicare Other

## 2011-07-25 ENCOUNTER — Encounter (HOSPITAL_COMMUNITY): Admission: RE | Admit: 2011-07-25 | Payer: Medicare Other | Source: Ambulatory Visit

## 2011-07-25 ENCOUNTER — Ambulatory Visit (HOSPITAL_COMMUNITY): Payer: Medicare Other

## 2011-07-26 ENCOUNTER — Encounter (HOSPITAL_COMMUNITY)
Admission: RE | Admit: 2011-07-26 | Discharge: 2011-07-26 | Disposition: A | Discharge status: Home / Self Care | Payer: Medicare Other | Source: Ambulatory Visit | Attending: Cardiology | Admitting: Cardiology

## 2011-07-26 ENCOUNTER — Ambulatory Visit (INDEPENDENT_AMBULATORY_CARE_PROVIDER_SITE_OTHER): Payer: Medicare Other | Admitting: *Deleted

## 2011-07-26 ENCOUNTER — Encounter (HOSPITAL_COMMUNITY): Payer: Self-pay | Admitting: Cardiology

## 2011-07-26 ENCOUNTER — Encounter (HOSPITAL_COMMUNITY): Payer: Self-pay

## 2011-07-26 DIAGNOSIS — I2510 Atherosclerotic heart disease of native coronary artery without angina pectoris: Secondary | ICD-10-CM

## 2011-07-26 DIAGNOSIS — R55 Syncope and collapse: Secondary | ICD-10-CM | POA: Insufficient documentation

## 2011-07-26 DIAGNOSIS — R7989 Other specified abnormal findings of blood chemistry: Secondary | ICD-10-CM | POA: Insufficient documentation

## 2011-07-26 MED ORDER — TECHNETIUM TC 99M TETROFOSMIN IV KIT
10.0000 | PACK | Freq: Once | INTRAVENOUS | Status: AC | PRN
  Administered 2011-07-26: 10.3 via INTRAVENOUS

## 2011-07-26 MED ORDER — TECHNETIUM TC 99M TETROFOSMIN IV KIT
30.0000 | PACK | Freq: Once | INTRAVENOUS | Status: AC | PRN
  Administered 2011-07-26: 32.4 via INTRAVENOUS

## 2011-07-26 NOTE — Progress Notes (Signed)
Stress Lab Nurses Notes - Alicia Mason 07/26/2011  Reason for doing test: Chest Pain  Type of test: Alicia Mason  Nurse performing test: Alicia Clipper, RN  Nuclear Medicine Tech: Alicia Mason  Echo Tech: Not Applicable  MD performing test: Alicia Mason  Family MD: Alicia Mason  Test explained and consent signed: yes  IV started: 22g jelco, Saline lock flushed, No redness or edema and Saline lock started in radiology  Symptoms: Funny feeling in head  Treatment/Intervention: None  Reason test stopped: protocol completed  After recovery IV was: Discontinued via X-ray tech and No redness or edema  Patient to return to Nuc. Med at :12:05 Patient discharged: Home  Patient's Condition upon discharge was: stable  Comments: Symptoms resolved in recovery. Resting BP120/60, HR 59 And Peak BP120/70 and HR 81.  Alicia Mason

## 2011-07-27 ENCOUNTER — Encounter: Payer: Self-pay | Admitting: Adult Health

## 2011-07-27 NOTE — Progress Notes (Signed)
Please see dictated report for Stress Nuclear Study.   

## 2011-07-31 ENCOUNTER — Ambulatory Visit (INDEPENDENT_AMBULATORY_CARE_PROVIDER_SITE_OTHER): Payer: Medicare Other | Admitting: Adult Health

## 2011-07-31 ENCOUNTER — Encounter: Payer: Self-pay | Admitting: Adult Health

## 2011-07-31 VITALS — BP 140/68 | HR 59 | Ht 63.0 in | Wt 157.0 lb

## 2011-07-31 DIAGNOSIS — R42 Dizziness and giddiness: Secondary | ICD-10-CM

## 2011-07-31 DIAGNOSIS — I2510 Atherosclerotic heart disease of native coronary artery without angina pectoris: Secondary | ICD-10-CM

## 2011-07-31 NOTE — Assessment & Plan Note (Signed)
She has had no further episodes of dizziness or syncope.  She is allowed to drive cautiously. She states she does not drive in the bright sun because it causes excessive tearing, but will drive in the morning and evening.  She is happy to be able to drive again. She will see Dr. Daleen Squibb in 6 months but call if she is symptomatic.

## 2011-07-31 NOTE — Progress Notes (Signed)
HPI: Alicia Mason is 77 y/o patient of Dr. Daleen Squibb, we are seeing on hospital follow-up after admission for syncope.  She has known history of CAD, CABG, Mixed CHF with ischemic CM. During recent admission, she was seen by Dr. Diona Browner.  Medications were adjusted to include decreasing Toprol XL from 100 mg daily, to 50 mg daily. HCTZ was added. Echocardiogram was completed demonstrating "Left ventricle: Wall thickness was increased in a pattern of moderate LVH. Systolic function was mildly to moderately reduced. The estimated ejection fraction was in the range of 40% to 45%.There is hypokinesis of the inferolateral myocardium. Doppler parameters are consistent with abnormal left ventricular relaxation (grade 1 diastolic dysfunction)."  She was released and a follow-up Lexiscan was completed as an outpatient. She is here to discuss results and to evaluate her status.  She denies any further episodes of syncope, chest pain or dizziness.  She has not driven her car since discharge until after results of stress test are discussed.    Allergies  Allergen Reactions  . Keflex     NOT SURE OF THE REACTION, BUT WAS TOLD NOT TO TAKE IT.    Current Outpatient Prescriptions  Medication Sig Dispense Refill  . ALPRAZolam (XANAX) 0.5 MG tablet Take 0.25 mg by mouth daily as needed. Anxiety       . aspirin EC 162 MG EC tablet Take 1 tablet (162 mg total) by mouth daily.      . hydrochlorothiazide (,MICROZIDE/HYDRODIURIL,) 12.5 MG capsule Take 1 capsule (12.5 mg total) by mouth daily.  30 capsule  2  . HYDROcodone-acetaminophen (VICODIN) 5-500 MG per tablet Take 1 tablet by mouth every 6 (six) hours as needed. Pain       . insulin glargine (LANTUS) 100 UNIT/ML injection Inject 35 Units into the skin daily.        . meclizine (ANTIVERT) 12.5 MG tablet Take 12.5 mg by mouth daily as needed. Motion sickness       . metoprolol (TOPROL-XL) 50 MG 24 hr tablet Take 1 tablet (50 mg total) by mouth daily.  30 tablet  2  .  nitroGLYCERIN (NITROSTAT) 0.4 MG SL tablet Place 1 tablet (0.4 mg total) under the tongue every 5 (five) minutes as needed for chest pain.  30 tablet  2  . oxybutynin (DITROPAN) 5 MG tablet Take 5 mg by mouth daily.        Bertram Gala Glycol-Propyl Glycol (SYSTANE OP) Place 1 drop into both eyes daily as needed. Dry Eyes       . pravastatin (PRAVACHOL) 10 MG tablet Take 10 mg by mouth daily.        . quinapril (ACCUPRIL) 40 MG tablet Take 40 mg by mouth daily.          Past Medical History  Diagnosis Date  . Essential hypertension, benign   . Type 2 diabetes mellitus   . Coronary atherosclerosis of native coronary artery     Multivessel  . Closed fracture of right humerus     Managed conservatively 2011  . Cerebrovascular disease   . Seasonal allergies   . Arthritis   . Vertigo   . Ischemic cardiomyopathy     EF 40% per Echo 06/2011.  Marland Kitchen Near syncope   . NSTEMI (non-ST elevated myocardial infarction)     06/2011.  . Diabetes mellitus     Past Surgical History  Procedure Date  . Coronary artery bypass graft 1999    LIMA to LAD, SVG to OM1 and  OM2, SVG to PDA  . Cataract surgery   . Abdominal hysterectomy     WITH BSO.    ZOX:WRUEAV of systems complete and found to be negative unless listed above  PHYSICAL EXAM BP 140/68  Pulse 59  Ht 5\' 3"  (1.6 m)  Wt 157 lb (71.215 kg)  BMI 27.81 kg/m2 General: Well developed, well nourished, in no acute distress Head: Eyes PERRLA, No xanthomas.   Normal cephalic and atramatic  Lungs: Clear bilaterally to auscultation and percussion. Heart: HRRR S1 S2, with 1/6 systolic murmur.Distant heart sounds.  Pulses are 2+ & equal.            No carotid bruit. No JVD.  No abdominal bruits. No femoral bruits. Abdomen: Bowel sounds are positive, abdomen soft and non-tender without masses or                  Hernia's noted. Msk:  Back normal, normal gait. Normal strength and tone for age. Extremities: No clubbing, cyanosis or edema.  DP +1 Neuro:  Alert and oriented X 3. Psych:  Good affect, responds appropriately  EKG: Sinus bradycardia rate of 59 bpm. Minimal voltage for LVH.  ASSESSMENT AND PLAN

## 2011-07-31 NOTE — Patient Instructions (Signed)
Your physician wants you to follow-up in: 6 MONTHS WITH DR WALL You will receive a reminder letter in the mail two months in advance. If you don't receive a letter, please call our office to schedule the follow-up appointment.

## 2011-07-31 NOTE — Assessment & Plan Note (Addendum)
She has been completely asymptomatic with any chest pain. She is more active and is actually using her riding lawn mower again.  Decreased dose of Toprol did not elicit angina.  Will continue this dose.  Stress test completed on 07/27/2011 demonstrated "Probably negative pharmacologic stress nuclear myocardial study, revealing normal LV size and overall systolic fx and no stress induced EKG abnormalities.. The fairly profound but small defect at the base of the inferior/inferolateral wall appears to represent superimposed breast and diaphragmatic attenatuation.  She is reassured by this.

## 2011-07-31 NOTE — Progress Notes (Signed)
Encounter addended by: Clarene Critchley on: 07/31/2011 10:36 AM<BR>     Documentation filed: Flowsheet VN

## 2011-08-29 ENCOUNTER — Encounter: Payer: Self-pay | Admitting: Cardiology

## 2013-07-29 ENCOUNTER — Emergency Department (HOSPITAL_COMMUNITY): Payer: Medicare Other

## 2013-07-29 ENCOUNTER — Encounter (HOSPITAL_COMMUNITY): Payer: Self-pay | Admitting: *Deleted

## 2013-07-29 ENCOUNTER — Inpatient Hospital Stay (HOSPITAL_COMMUNITY)
Admission: EM | Admit: 2013-07-29 | Discharge: 2013-07-31 | DRG: 690 | Disposition: A | Discharge status: Home / Self Care | Payer: Medicare Other | Attending: Internal Medicine | Admitting: Internal Medicine

## 2013-07-29 DIAGNOSIS — M129 Arthropathy, unspecified: Secondary | ICD-10-CM | POA: Diagnosis present

## 2013-07-29 DIAGNOSIS — E119 Type 2 diabetes mellitus without complications: Secondary | ICD-10-CM | POA: Diagnosis present

## 2013-07-29 DIAGNOSIS — I10 Essential (primary) hypertension: Secondary | ICD-10-CM | POA: Diagnosis present

## 2013-07-29 DIAGNOSIS — R410 Disorientation, unspecified: Secondary | ICD-10-CM | POA: Diagnosis present

## 2013-07-29 DIAGNOSIS — N318 Other neuromuscular dysfunction of bladder: Secondary | ICD-10-CM | POA: Diagnosis present

## 2013-07-29 DIAGNOSIS — Z883 Allergy status to other anti-infective agents status: Secondary | ICD-10-CM

## 2013-07-29 DIAGNOSIS — B961 Klebsiella pneumoniae [K. pneumoniae] as the cause of diseases classified elsewhere: Secondary | ICD-10-CM | POA: Diagnosis present

## 2013-07-29 DIAGNOSIS — I2510 Atherosclerotic heart disease of native coronary artery without angina pectoris: Secondary | ICD-10-CM | POA: Diagnosis present

## 2013-07-29 DIAGNOSIS — F29 Unspecified psychosis not due to a substance or known physiological condition: Secondary | ICD-10-CM

## 2013-07-29 DIAGNOSIS — Z79899 Other long term (current) drug therapy: Secondary | ICD-10-CM

## 2013-07-29 DIAGNOSIS — I679 Cerebrovascular disease, unspecified: Secondary | ICD-10-CM | POA: Diagnosis present

## 2013-07-29 DIAGNOSIS — Z951 Presence of aortocoronary bypass graft: Secondary | ICD-10-CM

## 2013-07-29 DIAGNOSIS — I252 Old myocardial infarction: Secondary | ICD-10-CM

## 2013-07-29 DIAGNOSIS — I2589 Other forms of chronic ischemic heart disease: Secondary | ICD-10-CM | POA: Diagnosis present

## 2013-07-29 DIAGNOSIS — R279 Unspecified lack of coordination: Secondary | ICD-10-CM | POA: Diagnosis present

## 2013-07-29 DIAGNOSIS — N390 Urinary tract infection, site not specified: Principal | ICD-10-CM | POA: Diagnosis present

## 2013-07-29 DIAGNOSIS — R270 Ataxia, unspecified: Secondary | ICD-10-CM | POA: Diagnosis present

## 2013-07-29 DIAGNOSIS — Z794 Long term (current) use of insulin: Secondary | ICD-10-CM

## 2013-07-29 DIAGNOSIS — E860 Dehydration: Secondary | ICD-10-CM | POA: Diagnosis present

## 2013-07-29 DIAGNOSIS — I255 Ischemic cardiomyopathy: Secondary | ICD-10-CM | POA: Diagnosis present

## 2013-07-29 LAB — BASIC METABOLIC PANEL
BUN: 14 mg/dL (ref 6–23)
CO2: 29 mEq/L (ref 19–32)
Calcium: 9.3 mg/dL (ref 8.4–10.5)
Creatinine, Ser: 0.8 mg/dL (ref 0.50–1.10)
GFR calc non Af Amer: 63 mL/min — ABNORMAL LOW (ref >90)
Glucose, Bld: 128 mg/dL — ABNORMAL HIGH (ref 70–99)

## 2013-07-29 LAB — GLUCOSE, CAPILLARY: Glucose-Capillary: 127 mg/dL — ABNORMAL HIGH (ref 70–99)

## 2013-07-29 LAB — URINALYSIS, ROUTINE W REFLEX MICROSCOPIC
Bilirubin Urine: NEGATIVE
Glucose, UA: NEGATIVE mg/dL
Protein, ur: NEGATIVE mg/dL
Urobilinogen, UA: 0.2 mg/dL (ref 0.0–1.0)

## 2013-07-29 LAB — URINE MICROSCOPIC-ADD ON

## 2013-07-29 LAB — CBC
HCT: 39 % (ref 36.0–46.0)
Hemoglobin: 13.6 g/dL (ref 12.0–15.0)
MCH: 32.3 pg (ref 26.0–34.0)
MCHC: 34.9 g/dL (ref 30.0–36.0)
MCV: 92.6 fL (ref 78.0–100.0)

## 2013-07-29 MED ORDER — CIPROFLOXACIN IN D5W 400 MG/200ML IV SOLN
400.0000 mg | Freq: Two times a day (BID) | INTRAVENOUS | Status: DC
  Administered 2013-07-29 – 2013-07-30 (×3): 400 mg via INTRAVENOUS
  Filled 2013-07-29 (×6): qty 200

## 2013-07-29 NOTE — ED Notes (Signed)
Family at bedside. Walked patient to restroom to obtain a urine sample. Patient needed stand by assistance.

## 2013-07-29 NOTE — ED Provider Notes (Signed)
CSN: [id number]     Arrival date & time 07/29/13  1951 History   First MD Initiated Contact with Patient 07/29/13 1954     Chief Complaint  Patient presents with  . Altered Mental Status   (Consider location/radiation/quality/duration/timing/severity/associated sxs/prior Treatment) Patient is a 79 y.o. female presenting with altered mental status. The history is provided by the patient.  Altered Mental Status Presenting symptoms: confusion and disorientation   Presenting symptoms: no partial responsiveness and no unresponsiveness   Severity:  Mild Most recent episode:  Today Episode history:  Single Timing:  Intermittent Progression:  Resolved Chronicity:  New Context: not dementia, not a nursing home resident, not a recent change in medication, not a recent illness and not a recent infection   Associated symptoms: bladder incontinence (has incontinence occasionally)   Associated symptoms: no abdominal pain, no difficulty breathing, no fever, no headaches, no nausea, no visual change and no vomiting   Associated symptoms comment:  Ataxia   Past Medical History  Diagnosis Date  . Essential hypertension, benign   . Type 2 diabetes mellitus   . Coronary atherosclerosis of native coronary artery     Multivessel  . Closed fracture of right humerus     Managed conservatively 2011  . Cerebrovascular disease   . Seasonal allergies   . Arthritis   . Vertigo   . Ischemic cardiomyopathy     EF 40% per Echo 06/2011.  Marland Kitchen Near syncope   . NSTEMI (non-ST elevated myocardial infarction)     06/2011.  . Diabetes mellitus    Past Surgical History  Procedure Laterality Date  . Coronary artery bypass graft  1999    LIMA to LAD, SVG to OM1 and OM2, SVG to PDA  . Cataract surgery    . Abdominal hysterectomy      WITH BSO.   Family History  Problem Relation Age of Onset  . Coronary artery disease Sister     Died with MI age 61  . Pneumonia Mother   . Arthritis Other   . Coronary artery  disease Other   . Diabetes Other   . Cancer Other    History  Substance Use Topics  . Smoking status: Never Smoker   . Smokeless tobacco: Never Used  . Alcohol Use: No   OB History   Grav Para Term Preterm Abortions TAB SAB Ect Mult Living                 Review of Systems  Constitutional: Negative for fever.  Gastrointestinal: Negative for nausea, vomiting and abdominal pain.  Genitourinary: Positive for bladder incontinence (has incontinence occasionally).  Neurological: Negative for headaches.  Psychiatric/Behavioral: Positive for confusion.  All other systems reviewed and are negative.    Allergies  Cephalexin  Home Medications   Current Outpatient Rx  Name  Route  Sig  Dispense  Refill  . ALPRAZolam (XANAX) 0.5 MG tablet   Oral   Take 0.25 mg by mouth daily as needed. Anxiety          . EXPIRED: hydrochlorothiazide (,MICROZIDE/HYDRODIURIL,) 12.5 MG capsule   Oral   Take 1 capsule (12.5 mg total) by mouth daily.   30 capsule   2   . HYDROcodone-acetaminophen (VICODIN) 5-500 MG per tablet   Oral   Take 1 tablet by mouth every 6 (six) hours as needed. Pain          . insulin glargine (LANTUS) 100 UNIT/ML injection   Subcutaneous   Inject 35  Units into the skin daily.           . meclizine (ANTIVERT) 12.5 MG tablet   Oral   Take 12.5 mg by mouth daily as needed. Motion sickness          . EXPIRED: metoprolol (TOPROL-XL) 50 MG 24 hr tablet   Oral   Take 1 tablet (50 mg total) by mouth daily.   30 tablet   2   . EXPIRED: nitroGLYCERIN (NITROSTAT) 0.4 MG SL tablet   Sublingual   Place 1 tablet (0.4 mg total) under the tongue every 5 (five) minutes as needed for chest pain.   30 tablet   2   . oxybutynin (DITROPAN) 5 MG tablet   Oral   Take 5 mg by mouth daily.           Bertram Gala Glycol-Propyl Glycol (SYSTANE OP)   Both Eyes   Place 1 drop into both eyes daily as needed. Dry Eyes          . pravastatin (PRAVACHOL) 10 MG tablet    Oral   Take 10 mg by mouth daily.           . promethazine (PHENERGAN) 25 MG tablet   Oral   Take 12.5 mg by mouth every 4 (four) hours as needed.           . quinapril (ACCUPRIL) 40 MG tablet   Oral   Take 40 mg by mouth daily.            BP 188/88  Pulse 63  Temp(Src) 97.7 F (36.5 C) (Oral)  Resp 18  Ht 5\' 5"  (1.651 m)  Wt 157 lb (71.215 kg)  BMI 26.13 kg/m2  SpO2 99% Physical Exam  Nursing note and vitals reviewed. Constitutional: She is oriented to person, place, and time. She appears well-developed and well-nourished. No distress.  HENT:  Head: Normocephalic and atraumatic.  Eyes: EOM are normal. Pupils are equal, round, and reactive to light.  Neck: Normal range of motion. Neck supple.  Cardiovascular: Normal rate and regular rhythm.  Exam reveals no friction rub.   No murmur heard. Pulmonary/Chest: Effort normal and breath sounds normal. No respiratory distress. She has no wheezes. She has no rales.  Abdominal: Soft. She exhibits no distension. There is no tenderness. There is no rebound.  Musculoskeletal: Normal range of motion. She exhibits no edema.  Neurological: She is alert and oriented to person, place, and time. No cranial nerve deficit. She exhibits normal muscle tone. Coordination and gait normal. GCS eye subscore is 4. GCS verbal subscore is 5. GCS motor subscore is 6.  Skin: No rash noted. She is not diaphoretic.    ED Course  Procedures (including critical care time) Labs Review Labs Reviewed  GLUCOSE, CAPILLARY - Abnormal; Notable for the following:    Glucose-Capillary 127 (*)    All other components within normal limits  CBC  BASIC METABOLIC PANEL  URINALYSIS, ROUTINE W REFLEX MICROSCOPIC   Imaging Review Dg Chest 2 View  07/29/2013   *RADIOLOGY REPORT*  Clinical Data: Altered mental status  CHEST - 2 VIEW  Comparison: July 11, 2011.  Findings: Status post coronary artery bypass graft. Cardiomediastinal silhouette appears normal.  No  pneumothorax or pleural effusion is noted.  No acute pulmonary disease is noted.  IMPRESSION: No acute cardiopulmonary abnormality seen.   Original Report Authenticated By: Lupita Raider.,  M.D.   Ct Head Wo Contrast  07/29/2013   *RADIOLOGY REPORT*  Clinical Data: Altered mental status  CT HEAD WITHOUT CONTRAST  Technique:  Contiguous axial images were obtained from the base of the skull through the vertex without contrast.  Comparison: CT scan of July 11, 2011.  Findings: Bony calvarium appears intact.  Diffuse cortical atrophy is noted.  No mass effect or midline shift is noted.  Ventricular size is within normal limits.  Old lacunar infarctions are noted in the right basal ganglia.  There is no evidence of mass lesion, hemorrhage or acute infarction.  IMPRESSION: Diffuse cortical atrophy.  Old lacunar infarctions in right basal ganglia.  No acute intracranial abnormality seen.   Original Report Authenticated By: Lupita Raider.,  M.D.     Date: 07/29/2013  Rate: 57  Rhythm: sinus bradycardia  QRS Axis: left  Intervals: normal  ST/T Wave abnormalities: flipped T waves inferiorly, similar to previous  Conduction Disutrbances:none  Narrative Interpretation:   Old EKG Reviewed: unchanged   MDM   1. UTI (lower urinary tract infection)   2. Confusion   3. Ataxia    28F with hx of HTN, diabetes presents with altered mental status and ataxia concerning for TIA. Patient found by granddaughter in her chair. Granddaughter states she was ataxic and confused. Patient thought that it was the morning, but it is actually the evening. She doesn't remember anything that happened today. She is alert and oriented to the year, place, and future events. She is oriented to past events. Here, vitals show hypertension. She is not ataxic at this point. Granddaughter checked her sugar at that time and it was 73.  Normal neuro exam here, normal gait, normal strength and sensation. Concern for possible TIA  symptoms. Will CT Head and plan for admission.  Urine shows UTI. Cipro given (allergic to keflex). Admitted to the hospital.    Dagmar Hait, MD 07/29/13 2218

## 2013-07-29 NOTE — H&P (Signed)
[facility] History and Physical  MIIA BLANKS  ZOX:[id number]  DOB: 1922-03-10   DOA: 07/29/2013   PCP:   Cassell Smiles., MD   Chief Complaint:  Acute confusion  HPI: Alicia Mason is a 79 y.o. female.   Elderly Caucasian lady lives alone and is usually alert appropriate and manages all her own affairs, and has no difficulty getting out and about, is brought to the emergency room by her granddaughter, who works as a [employer] examiner,  because she has been somewhat confused. By the time she got to the emergency room apparently she was much improved, but initial evaluations revealed abnormal urinalysis and hospitalist service was called.  Patient is currently alert and mostly appropriate, but continues to give a history of having difficulty getting up out of a chair that she had fell asleep in last night, and waking up thinking that she had  left front door open. Granddaughter continues to insist that there are  her details of the story which make no sense whatsoever  such as her insisting that she couldn't tell that time because she wasn't wearing her watch, when in fact she was wearing her watch when they found her.  Granddaughter reports that when she first saw her she checked her blood sugar which was 73, and that this blood sugar is normal for her she definitely does not get disoriented at this level.  There is no history of any focal lateralizing weakness  She has a history of incontinence related to overactive bladder for which she has recently been prescribed oxybutynin. She continues to take HCTZ  Rewiew of Systems:   All systems negative except as marked bold or noted in the HPI;  Constitutional:    malaise, fever and chills. ;  Eyes:   eye pain, redness and discharge. ;  ENMT:   ear pain, hoarseness, nasal congestion, sinus pressure and sore throat. ;  Cardiovascular:    chest pain, palpitations, diaphoresis, dyspnea and peripheral edema.  Respiratory:   cough,  hemoptysis, wheezing and stridor. ;  Gastrointestinal:  nausea, vomiting, diarrhea, constipation, abdominal pain, melena, blood in stool, hematemesis, jaundice and rectal bleeding. unusual weight loss..   Genitourinary:    frequency, dysuria, incontinence,flank pain and hematuria; Musculoskeletal:   back pain and neck pain.  swelling and trauma.;  Skin: .  pruritus, rash, abrasions, bruising and skin lesion.; ulcerations Neuro:    headache, lightheadedness and neck stiffness.  weakness, altered level of consciousness, altered mental status, extremity weakness, burning feet, involuntary movement, seizure and syncope.  Psych:    anxiety, depression, insomnia, tearfulness, panic attacks, hallucinations, paranoia, suicidal or homicidal ideation    Past Medical History  Diagnosis Date  . Essential hypertension, benign   . Type 2 diabetes mellitus   . Coronary atherosclerosis of native coronary artery     Multivessel  . Closed fracture of right humerus     Managed conservatively 2011  . Cerebrovascular disease   . Seasonal allergies   . Arthritis   . Vertigo   . Ischemic cardiomyopathy     EF 40% per Echo 06/2011.  Marland Kitchen Near syncope   . NSTEMI (non-ST elevated myocardial infarction)     06/2011.  . Diabetes mellitus     Past Surgical History  Procedure Laterality Date  . Coronary artery bypass graft  1999    LIMA to LAD, SVG to OM1 and OM2, SVG to PDA  . Cataract surgery    . Abdominal hysterectomy  WITH BSO.    Medications:  HOME MEDS: Prior to Admission medications   Medication Sig Start Date End Date Taking? Authorizing Provider  ALPRAZolam Prudy Feeler) 0.5 MG tablet Take 0.25 mg by mouth daily as needed. Anxiety     Historical Provider, MD  hydrochlorothiazide (,MICROZIDE/HYDRODIURIL,) 12.5 MG capsule Take 1 capsule (12.5 mg total) by mouth daily. 07/13/11 07/12/12  Elliot Cousin, MD  HYDROcodone-acetaminophen (VICODIN) 5-500 MG per tablet Take 1 tablet by mouth every 6 (six) hours  as needed. Pain     Historical Provider, MD  insulin glargine (LANTUS) 100 UNIT/ML injection Inject 35 Units into the skin daily.      Historical Provider, MD  meclizine (ANTIVERT) 12.5 MG tablet Take 12.5 mg by mouth daily as needed. Motion sickness     Historical Provider, MD  metoprolol (TOPROL-XL) 50 MG 24 hr tablet Take 1 tablet (50 mg total) by mouth daily. 07/13/11 07/12/12  Elliot Cousin, MD  nitroGLYCERIN (NITROSTAT) 0.4 MG SL tablet Place 1 tablet (0.4 mg total) under the tongue every 5 (five) minutes as needed for chest pain. 07/13/11 07/12/12  Elliot Cousin, MD  oxybutynin (DITROPAN) 5 MG tablet Take 5 mg by mouth daily.      Historical Provider, MD  Polyethyl Glycol-Propyl Glycol (SYSTANE OP) Place 1 drop into both eyes daily as needed. Dry Eyes     Historical Provider, MD  pravastatin (PRAVACHOL) 10 MG tablet Take 10 mg by mouth daily.      Historical Provider, MD  promethazine (PHENERGAN) 25 MG tablet Take 12.5 mg by mouth every 4 (four) hours as needed.      Historical Provider, MD  quinapril (ACCUPRIL) 40 MG tablet Take 40 mg by mouth daily.      Historical Provider, MD     Allergies:  Allergies  Allergen Reactions  . Cephalexin     NOT SURE OF THE REACTION, BUT WAS TOLD NOT TO TAKE IT.    Social History:   reports that she has never smoked. She has never used smokeless tobacco. She reports that she does not drink alcohol or use illicit drugs.  Family History: Family History  Problem Relation Age of Onset  . Coronary artery disease Sister     Died with MI age 85  . Pneumonia Mother   . Arthritis Other   . Coronary artery disease Other   . Diabetes Other   . Cancer Other      Physical Exam: Filed Vitals:   07/29/13 2002 07/29/13 2135 07/29/13 2308  BP: 188/88 188/83 164/72  Pulse: 63 64 66  Temp: 97.7 F (36.5 C)    TempSrc: Oral    Resp: 18 18 18   Height: 5\' 5"  (1.651 m)    Weight: 71.215 kg (157 lb)    SpO2: 99% 98%    Blood pressure 164/72, pulse 66,  temperature 97.7 F (36.5 C), temperature source Oral, resp. rate 18, height 5\' 5"  (1.651 m), weight 71.215 kg (157 lb), SpO2 98.00%. Body mass index is 26.13 kg/(m^2).   GEN:  Pleasant elderly Caucasian lady lying bed in no acute distress; cooperative with exam PSYCH:  alert and oriented x4;  neither anxious nor depressed; affect is appropriate. HEENT: Mucous membranes pink and anicteric; PERRLA; EOM intact; no cervical lymphadenopathy nor thyromegaly or carotid bruit; no JVD; Breasts:: Not examined CHEST WALL: No tenderness CHEST: Normal respiration, clear to auscultation bilaterally HEART: Regular rate and rhythm; no murmurs rubs or gallops BACK: Mild kyphosis no scoliosis; no CVA tenderness ABDOMEN:  Obese, soft non-tender; no masses, no organomegaly, normal abdominal bowel sounds; no pannus; no intertriginous candida. Rectal Exam: Not done EXTREMITIES:  age-appropriate arthropathy of the hands and knees; no edema; no ulcerations. Genitalia: not examined PULSES: 2+ and symmetric SKIN: Normal hydration no rash or ulceration CNS: Cranial nerves 2-12 grossly intact no focal lateralizing neurologic deficit   Labs on Admission:  Basic Metabolic Panel:  Recent Labs Lab 07/29/13 2014  NA 139  K 3.7  CL 101  CO2 29  GLUCOSE 128*  BUN 14  CREATININE 0.80  CALCIUM 9.3   Liver Function Tests: No results found for this basename: AST, ALT, ALKPHOS, BILITOT, PROT, ALBUMIN,  in the last 168 hours No results found for this basename: LIPASE, AMYLASE,  in the last 168 hours No results found for this basename: AMMONIA,  in the last 168 hours CBC:  Recent Labs Lab 07/29/13 2014  WBC 8.2  HGB 13.6  HCT 39.0  MCV 92.6  PLT 214   Cardiac Enzymes: No results found for this basename: CKTOTAL, CKMB, CKMBINDEX, TROPONINI,  in the last 168 hours BNP: No components found with this basename: POCBNP,  D-dimer: No components found with this basename: D-DIMER,  CBG:  Recent Labs Lab  07/29/13 2014  GLUCAP 127*    Radiological Exams on Admission: Dg Chest 2 View  07/29/2013   *RADIOLOGY REPORT*  Clinical Data: Altered mental status  CHEST - 2 VIEW  Comparison: July 11, 2011.  Findings: Status post coronary artery bypass graft. Cardiomediastinal silhouette appears normal.  No pneumothorax or pleural effusion is noted.  No acute pulmonary disease is noted.  IMPRESSION: No acute cardiopulmonary abnormality seen.   Original Report Authenticated By: Lupita Raider.,  M.D.   Ct Head Wo Contrast  07/29/2013   *RADIOLOGY REPORT*  Clinical Data: Altered mental status  CT HEAD WITHOUT CONTRAST  Technique:  Contiguous axial images were obtained from the base of the skull through the vertex without contrast.  Comparison: CT scan of July 11, 2011.  Findings: Bony calvarium appears intact.  Diffuse cortical atrophy is noted.  No mass effect or midline shift is noted.  Ventricular size is within normal limits.  Old lacunar infarctions are noted in the right basal ganglia.  There is no evidence of mass lesion, hemorrhage or acute infarction.  IMPRESSION: Diffuse cortical atrophy.  Old lacunar infarctions in right basal ganglia.  No acute intracranial abnormality seen.   Original Report Authenticated By: Lupita Raider.,  M.D.     Assessment/Plan   Active Problems:   HYPERTENSION   DM type 2 (diabetes mellitus, type 2)   Cerebrovascular disease   Ischemic cardiomyopathy   UTI (lower urinary tract infection)   Confusion   Ataxia    PLAN: Admit for hydration and initiation of treatment for urinary tract infection pending results of cultures We're obliged to use a quinolone since she is allergic to cephalosporin Will discontinue HCTZ because of her history of urinary frequency Will give a reduced dose of her Lantus while in hospital  Other plans as per orders.  Code Status: Full code Family Communication: Plans discuss with patient and granddaughter at bedside Disposition  Plan: Depending on hospital course    Alicia Mason Nocturnist [facility] Pager [phone]  07/29/2013, 11:46 PM

## 2013-07-29 NOTE — ED Notes (Signed)
Pt arrived from home by [facility]. Called out for confusion, no memory of anything this morning. Pt alert but thinks it is still morning at this time. Pt just not right as per family.

## 2013-07-29 NOTE — ED Notes (Signed)
Pt answer questions, knows the year & month, did not know the day. Pt confused on the time of day, thinks it was morning saying she woke & it was daylight.

## 2013-07-30 ENCOUNTER — Encounter (HOSPITAL_COMMUNITY): Payer: Self-pay | Admitting: Internal Medicine

## 2013-07-30 DIAGNOSIS — R270 Ataxia, unspecified: Secondary | ICD-10-CM | POA: Diagnosis present

## 2013-07-30 DIAGNOSIS — R410 Disorientation, unspecified: Secondary | ICD-10-CM | POA: Diagnosis present

## 2013-07-30 DIAGNOSIS — N390 Urinary tract infection, site not specified: Secondary | ICD-10-CM | POA: Diagnosis present

## 2013-07-30 LAB — BASIC METABOLIC PANEL
BUN: 15 mg/dL (ref 6–23)
CO2: 31 mEq/L (ref 19–32)
Calcium: 9.4 mg/dL (ref 8.4–10.5)
Chloride: 105 mEq/L (ref 96–112)
Creatinine, Ser: 0.78 mg/dL (ref 0.50–1.10)
GFR calc Af Amer: 83 mL/min — ABNORMAL LOW (ref >90)
GFR calc non Af Amer: 72 mL/min — ABNORMAL LOW (ref >90)
Glucose, Bld: 44 mg/dL — CL (ref 70–99)
Potassium: 3.6 mEq/L (ref 3.5–5.1)
Sodium: 143 mEq/L (ref 135–145)

## 2013-07-30 LAB — CBC
HCT: 38 % (ref 36.0–46.0)
Hemoglobin: 13.1 g/dL (ref 12.0–15.0)
MCHC: 34.5 g/dL (ref 30.0–36.0)
MCV: 93.6 fL (ref 78.0–100.0)

## 2013-07-30 LAB — GLUCOSE, CAPILLARY
Glucose-Capillary: 140 mg/dL — ABNORMAL HIGH (ref 70–99)
Glucose-Capillary: 106 mg/dL — ABNORMAL HIGH (ref 70–99)

## 2013-07-30 LAB — HEMOGLOBIN A1C: Hgb A1c MFr Bld: 5.7 % — ABNORMAL HIGH (ref <5.7)

## 2013-07-30 MED ORDER — ALPRAZOLAM 0.25 MG PO TABS: 0.2500 mg | ORAL_TABLET | Freq: Every evening | ORAL | Status: DC | PRN

## 2013-07-30 MED ORDER — ONDANSETRON HCL 4 MG/2ML IJ SOLN: 4.0000 mg | INTRAMUSCULAR | Status: DC | PRN

## 2013-07-30 MED ORDER — INSULIN ASPART 100 UNIT/ML ~~LOC~~ SOLN: 0.0000 [IU] | Freq: Every day | SUBCUTANEOUS | Status: DC

## 2013-07-30 MED ORDER — NITROGLYCERIN 0.4 MG SL SUBL: 0.4000 mg | SUBLINGUAL_TABLET | SUBLINGUAL | Status: DC | PRN

## 2013-07-30 MED ORDER — POTASSIUM CHLORIDE IN NACL 20-0.9 MEQ/L-% IV SOLN
INTRAVENOUS | Status: DC
  Administered 2013-07-30 (×2): via INTRAVENOUS

## 2013-07-30 MED ORDER — INSULIN GLARGINE 100 UNIT/ML ~~LOC~~ SOLN
20.0000 [IU] | Freq: Every day | SUBCUTANEOUS | Status: DC
  Administered 2013-07-30 (×2): 20 [IU] via SUBCUTANEOUS
  Filled 2013-07-30 (×3): qty 0.2

## 2013-07-30 MED ORDER — DEXTROSE 50 % IV SOLN
50.0000 mL | Freq: Once | INTRAVENOUS | Status: AC | PRN
  Administered 2013-07-30: 50 mL via INTRAVENOUS

## 2013-07-30 MED ORDER — FLEET ENEMA 7-19 GM/118ML RE ENEM: 1.0000 | ENEMA | Freq: Once | RECTAL | Status: AC | PRN

## 2013-07-30 MED ORDER — INSULIN ASPART 100 UNIT/ML ~~LOC~~ SOLN
0.0000 [IU] | Freq: Three times a day (TID) | SUBCUTANEOUS | Status: DC
  Administered 2013-07-30: 1 [IU] via SUBCUTANEOUS

## 2013-07-30 MED ORDER — TRAZODONE HCL 50 MG PO TABS: 25.0000 mg | ORAL_TABLET | Freq: Every evening | ORAL | Status: DC | PRN

## 2013-07-30 MED ORDER — SIMVASTATIN 10 MG PO TABS
5.0000 mg | ORAL_TABLET | Freq: Every day | ORAL | Status: DC
  Administered 2013-07-30: 5 mg via ORAL
  Filled 2013-07-30: qty 1

## 2013-07-30 MED ORDER — ACETAMINOPHEN 325 MG PO TABS: 650.0000 mg | ORAL_TABLET | ORAL | Status: DC | PRN

## 2013-07-30 MED ORDER — QUINAPRIL HCL 10 MG PO TABS
40.0000 mg | ORAL_TABLET | Freq: Every day | ORAL | Status: DC
  Filled 2013-07-30: qty 4

## 2013-07-30 MED ORDER — OXYBUTYNIN CHLORIDE 5 MG PO TABS
5.0000 mg | ORAL_TABLET | Freq: Every day | ORAL | Status: DC
  Administered 2013-07-30 – 2013-07-31 (×2): 5 mg via ORAL
  Filled 2013-07-30 (×2): qty 1

## 2013-07-30 MED ORDER — ENOXAPARIN SODIUM 40 MG/0.4ML ~~LOC~~ SOLN
40.0000 mg | SUBCUTANEOUS | Status: DC
  Administered 2013-07-30 – 2013-07-31 (×2): 40 mg via SUBCUTANEOUS
  Filled 2013-07-30 (×2): qty 0.4

## 2013-07-30 MED ORDER — HALOPERIDOL LACTATE 5 MG/ML IJ SOLN: 2.0000 mg | Freq: Every evening | INTRAMUSCULAR | Status: DC | PRN

## 2013-07-30 MED ORDER — BISACODYL 10 MG RE SUPP: 10.0000 mg | Freq: Every day | RECTAL | Status: DC | PRN

## 2013-07-30 MED ORDER — METOPROLOL SUCCINATE ER 50 MG PO TB24
50.0000 mg | ORAL_TABLET | Freq: Every day | ORAL | Status: DC
  Administered 2013-07-30 – 2013-07-31 (×2): 50 mg via ORAL
  Filled 2013-07-30 (×2): qty 1

## 2013-07-30 MED ORDER — LISINOPRIL 10 MG PO TABS
40.0000 mg | ORAL_TABLET | Freq: Every day | ORAL | Status: DC
  Administered 2013-07-30 – 2013-07-31 (×2): 40 mg via ORAL
  Filled 2013-07-30 (×2): qty 4

## 2013-07-30 MED ORDER — DEXTROSE 50 % IV SOLN
INTRAVENOUS | Status: AC
  Administered 2013-07-30: 50 mL via INTRAVENOUS
  Filled 2013-07-30: qty 50

## 2013-07-30 NOTE — Progress Notes (Signed)
[facility] PROGRESS NOTE  MIRIAM LILES ZOX:[id number] DOB: [birth year] DOA: 07/29/2013 PCP: Cassell Smiles., MD Brief narrative 79 year old independent female with history of type 2 diabetes mellitus, history of ischemic cardiomyopathy with EF of 40% in 2012, history of NSTEMI, hypertension heart in by family for acute onset of confusion and low normal blood glucose. Workup in the ED suggestive of UTI.  Assessment/Plan: UTI Started on empiric ciprofloxacin she did monitor urine culture Remains afebrile. Continue IV hydration.  Dehydration Continue IV fluids. Holding HCTZ as patient complains of increased urinary frequency. PT eval  Diabetes mellitus Noted for low normal blood glucose. Fairly stable now. Continue with reduced dose of  lantus. Check A1C.  Ischemic cardiomyopathy Currently euvolemic Continue Toprol, quinapril  and statin  DVT prophylaxis    Code Status:full Family Communication: daughter at bedside  Disposition Plan: home likely tomorrow   Consultants:  none  Procedures:  None  Antibiotics:  IV ciprofloxacin 9/10  HPI/Subjective: Patient seen and examined this morning. Admission H&P reviewed. has no further confusion.  Objective: Filed Vitals:   07/30/13 1018  BP: 127/68  Pulse: 71  Temp: 98.7 F (37.1 C)  Resp: 18    Intake/Output Summary (Last 24 hours) at 07/30/13 1115 Last data filed at 07/30/13 0841  Gross per 24 hour  Intake    670 ml  Output    950 ml  Net   -280 ml   Filed Weights   07/29/13 2002 07/29/13 2353 07/30/13 0513  Weight: 71.215 kg (157 lb) 62.642 kg (138 lb 1.6 oz) 63.504 kg (140 lb)    Exam:   General:  Elderly female in no acute distress  HEENT: No pallor, moist oral mucosa  Chest: Clear to auscultation bilaterally, no added sounds  CVS: Normal S1 and S2, no murmurs rub or gallop  Abdomen: Soft, nontender, nondistended, bowel sounds present  Extremities: Warm, no edema  CNS: AAOx  3  Data Reviewed: Basic Metabolic Panel:  Recent Labs Lab 07/29/13 2014 07/30/13 0528  NA 139 143  K 3.7 3.6  CL 101 105  CO2 29 31  GLUCOSE 128* 44*  BUN 14 15  CREATININE 0.80 0.78  CALCIUM 9.3 9.4   Liver Function Tests: No results found for this basename: AST, ALT, ALKPHOS, BILITOT, PROT, ALBUMIN,  in the last 168 hours No results found for this basename: LIPASE, AMYLASE,  in the last 168 hours No results found for this basename: AMMONIA,  in the last 168 hours CBC:  Recent Labs Lab 07/29/13 2014 07/30/13 0528  WBC 8.2 8.2  HGB 13.6 13.1  HCT 39.0 38.0  MCV 92.6 93.6  PLT 214 224   Cardiac Enzymes: No results found for this basename: CKTOTAL, CKMB, CKMBINDEX, TROPONINI,  in the last 168 hours BNP (last 3 results) No results found for this basename: PROBNP,  in the last 8760 hours CBG:  Recent Labs Lab 07/29/13 2014 07/30/13 0654 07/30/13 0744  GLUCAP 127* 101* 140*    No results found for this or any previous visit (from the past 240 hour(s)).   Studies: Dg Chest 2 View  07/29/2013   *RADIOLOGY REPORT*  Clinical Data: Altered mental status  CHEST - 2 VIEW  Comparison: July 11, 2011.  Findings: Status post coronary artery bypass graft. Cardiomediastinal silhouette appears normal.  No pneumothorax or pleural effusion is noted.  No acute pulmonary disease is noted.  IMPRESSION: No acute cardiopulmonary abnormality seen.   Original Report Authenticated By: Lupita Raider.,  M.D.  Ct Head Wo Contrast  07/29/2013   *RADIOLOGY REPORT*  Clinical Data: Altered mental status  CT HEAD WITHOUT CONTRAST  Technique:  Contiguous axial images were obtained from the base of the skull through the vertex without contrast.  Comparison: CT scan of July 11, 2011.  Findings: Bony calvarium appears intact.  Diffuse cortical atrophy is noted.  No mass effect or midline shift is noted.  Ventricular size is within normal limits.  Old lacunar infarctions are noted in the right basal  ganglia.  There is no evidence of mass lesion, hemorrhage or acute infarction.  IMPRESSION: Diffuse cortical atrophy.  Old lacunar infarctions in right basal ganglia.  No acute intracranial abnormality seen.   Original Report Authenticated By: Lupita Raider.,  M.D.    Scheduled Meds: . ciprofloxacin  400 mg Intravenous Q12H  . enoxaparin (LOVENOX) injection  40 mg Subcutaneous Q24H  . insulin aspart  0-5 Units Subcutaneous QHS  . insulin aspart  0-9 Units Subcutaneous TID WC  . insulin glargine  20 Units Subcutaneous QHS  . lisinopril  40 mg Oral Daily  . metoprolol succinate  50 mg Oral Daily  . oxybutynin  5 mg Oral Daily  . simvastatin  5 mg Oral q1800   Continuous Infusions: . 0.9 % NaCl with KCl 20 mEq / L 50 mL/hr at 07/30/13 0114      Time spent: 25 minutes    Sabrin Dunlevy  [facility] Pager [phone] If 7PM-7AM, please contact night-coverage at [email or web address], password [facility] 07/30/2013, 11:15 AM  LOS: 1 day

## 2013-07-30 NOTE — Care Management Note (Unsigned)
    Page 1 of 1   07/30/2013     1:47:41 PM   CARE MANAGEMENT NOTE 07/30/2013  Patient:  Alicia Mason, Alicia Mason   Account Number:  [id number]  Date Initiated:  07/30/2013  Documentation initiated by:  Rosemary Holms  Subjective/Objective Assessment:   Pt states she lives alone and likes it that way. Uses her three prong cane. Does not want HH or additional DME.     Action/Plan:   Anticipated DC Date:  08/01/2013   Anticipated DC Plan:  HOME/SELF CARE      DC Planning Services  CM consult      Choice offered to / List presented to:             Status of service:  In process, will continue to follow Medicare Important Message given?   (If response is "NO", the following Medicare IM given date fields will be blank) Date Medicare IM given:   Date Additional Medicare IM given:    Discharge Disposition:    Per UR Regulation:    If discussed at Long Length of Stay Meetings, dates discussed:    Comments:  07/30/13 Rosemary Holms RN BNS CM

## 2013-07-30 NOTE — Evaluation (Signed)
Physical Therapy Evaluation Patient Details Name: Alicia Mason MRN: [id number] DOB: 01-30-1922 Today's Date: 07/30/2013 Time: 4098-1191 PT Time Calculation (min): 35 min  PT Assessment / Plan / Recommendation History of Present Illness   Pt is admitted with confusion.  Pt states that she has chronic sciatica and was unable to get up out of a chair after having taken a nap for a couple of hours.  She states that when she awoke she was quite confused.  Clinical Impression  Pt was seen for evaluation.  She was very cooperative and extremely chatty, frequently repeating herself.  I do not know if this is her baseline.  Her mobility appears to be at baseline and I have no follow up recommendations for that.  Because of her advanced age, she should probably have increased family supervision at home.    PT Assessment  Patent does not need any further PT services    Follow Up Recommendations  No PT follow up    Does the patient have the potential to tolerate intense rehabilitation      Barriers to Discharge        Equipment Recommendations  None recommended by PT    Recommendations for Other Services     Frequency      Precautions / Restrictions Precautions Precautions: None Restrictions Weight Bearing Restrictions: No   Pertinent Vitals/Pain       Mobility  Bed Mobility Bed Mobility: Supine to Sit;Sit to Supine Supine to Sit: 7: Independent;HOB flat Sit to Supine: 7: Independent;HOB flat Transfers Transfers: Sit to Stand;Stand to Sit Sit to Stand: 6: Modified independent (Device/Increase time);From bed;With upper extremity assist Stand to Sit: 6: Modified independent (Device/Increase time);To bed;With upper extremity assist Ambulation/Gait Ambulation/Gait Assistance: 6: Modified independent (Device/Increase time) Ambulation Distance (Feet): 200 Feet Assistive device: Straight cane Gait Pattern: Within Functional Limits;Trunk flexed General Gait Details: pt instructed  in keeping head erect Stairs: No Wheelchair Mobility Wheelchair Mobility: No    Exercises     PT Diagnosis:    PT Problem List:   PT Treatment Interventions:       PT Goals(Current goals can be found in the care plan section) Acute Rehab PT Goals PT Goal Formulation: No goals set, d/c therapy  Visit Information  Last PT Received On: 07/30/13       Prior Functioning  Home Living Family/patient expects to be discharged to:: Private residence Living Arrangements: Alone Available Help at Discharge: Family;Available PRN/intermittently Type of Home: House Home Access: Stairs to enter [employer] of Steps: 1 Entrance Stairs-Rails: None Home Layout: One level Home Equipment: Walker - 2 wheels;Cane - quad Prior Function Level of Independence: Independent with assistive device(s) Communication Communication: No difficulties    Cognition  Cognition Arousal/Alertness: Awake/alert Behavior During Therapy: WFL for tasks assessed/performed Overall Cognitive Status: Within Functional Limits for tasks assessed    Extremity/Trunk Assessment Upper Extremity Assessment Upper Extremity Assessment: Overall WFL for tasks assessed Lower Extremity Assessment Lower Extremity Assessment: Overall WFL for tasks assessed   Balance Balance Balance Assessed: Yes High Level Balance High Level Balance Activites: Backward walking;Direction changes;Turns;Head turns High Level Balance Comments: no LOB with above  End of Session PT - End of Session Equipment Utilized During Treatment: Gait belt Activity Tolerance: Patient tolerated treatment well Patient left: in bed;with call bell/phone within reach;with bed alarm set  GP     Myrlene Broker L 07/30/2013, 11:51 AM

## 2013-07-31 DIAGNOSIS — I2589 Other forms of chronic ischemic heart disease: Secondary | ICD-10-CM

## 2013-07-31 LAB — URINE CULTURE

## 2013-07-31 LAB — GLUCOSE, CAPILLARY: Glucose-Capillary: 73 mg/dL (ref 70–99)

## 2013-07-31 MED ORDER — INSULIN GLARGINE 100 UNIT/ML ~~LOC~~ SOLN: 25.0000 [IU] | Freq: Every day | SUBCUTANEOUS | Status: DC

## 2013-07-31 MED ORDER — CIPROFLOXACIN HCL 500 MG PO TABS: 250.0000 mg | ORAL_TABLET | Freq: Two times a day (BID) | ORAL | Status: DC

## 2013-07-31 NOTE — Plan of Care (Signed)
Problem: Discharge Progression Outcomes Goal: Barriers To Progression Addressed/Resolved Outcome: Completed/Met Date Met:  07/31/13 Alert oriented Goal: Pain controlled with appropriate interventions Outcome: Completed/Met Date Met:  07/31/13 Denies pain Goal: Complications resolved/controlled Outcome: Completed/Met Date Met:  07/31/13 Cipro for home  Goal: Other Discharge Outcomes/Goals Outcome: Completed/Met Date Met:  07/31/13 Discharged to home with daughter

## 2013-07-31 NOTE — Discharge Summary (Addendum)
Physician Discharge Summary  Alicia Mason ZOX:[id number] DOB: 21-Oct-1922 DOA: 07/29/2013  PCP: Cassell Smiles., MD  Admit date: 07/29/2013 Discharge date: 07/31/2013  Time spent: 40 minutes  Recommendations for Outpatient Follow-up:  1. D/c home with outpt follow up  Discharge Diagnoses:  Principal Problem:   UTI (lower urinary tract infection)  Active Problems:   HYPERTENSION   DM type 2 (diabetes mellitus, type 2)   Cerebrovascular disease   Ischemic cardiomyopathy   Confusion   Ataxia   Discharge Condition: fair  Diet recommendation: diabetic  Filed Weights   07/29/13 2353 07/30/13 0513 07/31/13 0654  Weight: 62.642 kg (138 lb 1.6 oz) 63.504 kg (140 lb) 65.137 kg (143 lb 9.6 oz)    History of present illness:  Please refer to admission H&P for details, but in brief, 79 year old independent female with history of type 2 diabetes mellitus, history of ischemic cardiomyopathy with EF of 40% in 2012, history of NSTEMI, hypertension heart in by family for acute onset of confusion and low normal blood glucose. Workup in the ED suggestive of UTI.   Hospital Course:  UTI  Started on empiric ciprofloxacin . cx growing klebsiella which is sensitive to quinolones. Will d/c her on po ciprofloxacin 250 mg bid for 7 day course.  Remains afebrile.   Dehydration  Improved with fluids  Diabetes mellitus  Noted for low normal blood glucose. A1C of 5.7. reduced lantus to 25 units daily upon discharge.  Ischemic cardiomyopathy  Currently euvolemic  Continue Toprol, quinapril and statin    Code Status:full  Family Communication: spoke with daughter Disposition Plan: home with PCP follow up  Consultants:  None   Procedures:  None   Antibiotics:  ciprofloxacin   Discharge Exam: Filed Vitals:   07/31/13 1053  BP: 117/72  Pulse: 59  Temp: 98.4 F (36.9 C)  Resp: 20    General: Elderly female in no acute distress  HEENT: No pallor, moist oral mucosa  Chest:  Clear to auscultation bilaterally, no added sounds  CVS: Normal S1 and S2, no murmurs rub or gallop  Abdomen: Soft, nontender, nondistended, bowel sounds present Extremities: Warm, no edema  CNS: AAOx 3   Discharge Instructions     Medication List         ciprofloxacin 500 MG tablet  Commonly known as:  CIPRO  Take 0.5 tablets (250 mg total) by mouth 2 (two) times daily.until 9/17     insulin glargine 100 UNIT/ML injection  Commonly known as:  LANTUS  Inject 0.25 mLs (25 Units total) into the skin at bedtime.     meclizine 12.5 MG tablet  Commonly known as:  ANTIVERT  Take 12.5 mg by mouth daily as needed. Motion sickness     metoprolol succinate 100 MG 24 hr tablet  Commonly known as:  TOPROL-XL  Take 100 mg by mouth daily. Take with or immediately following a meal.     MYRBETRIQ 50 MG Tb24 tablet  Generic drug:  mirabegron ER  Take 1 tablet by mouth daily.     pravastatin 10 MG tablet  Commonly known as:  PRAVACHOL  Take 10 mg by mouth daily.     quinapril 40 MG tablet  Commonly known as:  ACCUPRIL  Take 40 mg by mouth daily.       Allergies  Allergen Reactions  . Cephalexin     NOT SURE OF THE REACTION, BUT WAS TOLD NOT TO TAKE IT.       Follow-up Information  Follow up with Cassell Smiles., MD In 1 week.   Specialty:  Internal Medicine   Contact information:   [address] [place] [address] [phone]        The results of significant diagnostics from this hospitalization (including imaging, microbiology, ancillary and laboratory) are listed below for reference.    Significant Diagnostic Studies: Dg Chest 2 View  07/29/2013   *RADIOLOGY REPORT*  Clinical Data: Altered mental status  CHEST - 2 VIEW  Comparison: July 11, 2011.  Findings: Status post coronary artery bypass graft. Cardiomediastinal silhouette appears normal.  No pneumothorax or pleural effusion is noted.  No acute pulmonary disease is noted.   IMPRESSION: No acute cardiopulmonary abnormality seen.   Original Report Authenticated By: Lupita Raider.,  M.D.   Ct Head Wo Contrast  07/29/2013   *RADIOLOGY REPORT*  Clinical Data: Altered mental status  CT HEAD WITHOUT CONTRAST  Technique:  Contiguous axial images were obtained from the base of the skull through the vertex without contrast.  Comparison: CT scan of July 11, 2011.  Findings: Bony calvarium appears intact.  Diffuse cortical atrophy is noted.  No mass effect or midline shift is noted.  Ventricular size is within normal limits.  Old lacunar infarctions are noted in the right basal ganglia.  There is no evidence of mass lesion, hemorrhage or acute infarction.  IMPRESSION: Diffuse cortical atrophy.  Old lacunar infarctions in right basal ganglia.  No acute intracranial abnormality seen.   Original Report Authenticated By: Lupita Raider.,  M.D.    Microbiology: Recent Results (from the past 240 hour(s))  URINE CULTURE     Status: None   Collection Time    07/29/13  9:16 PM      Result Value Range Status   Specimen Description URINE, CLEAN CATCH   Final   Special Requests NONE   Final   Culture  Setup Time     Final   Value: 07/29/2013 22:30     Performed at [facility] Count     Final   Value: >=100,000 COLONIES/ML     Performed at [facility]   Culture     Final   Value: KLEBSIELLA PNEUMONIAE     Note: Previous Result = 60 000 COLONIES/ML CORRECTED RESULTS CALLED TO: CINDY MORRIS ON 07/31/2013 AT 9:32AM HAJAM     Performed at [facility]   Report Status 07/31/2013 FINAL   Final   Organism ID, Bacteria KLEBSIELLA PNEUMONIAE   Final     Labs: Basic Metabolic Panel:  Recent Labs Lab 07/29/13 2014 07/30/13 0528  NA 139 143  K 3.7 3.6  CL 101 105  CO2 29 31  GLUCOSE 128* 44*  BUN 14 15  CREATININE 0.80 0.78  CALCIUM 9.3 9.4   Liver Function Tests: No results found for this basename: AST, ALT, ALKPHOS, BILITOT, PROT, ALBUMIN,   in the last 168 hours No results found for this basename: LIPASE, AMYLASE,  in the last 168 hours No results found for this basename: AMMONIA,  in the last 168 hours CBC:  Recent Labs Lab 07/29/13 2014 07/30/13 0528  WBC 8.2 8.2  HGB 13.6 13.1  HCT 39.0 38.0  MCV 92.6 93.6  PLT 214 224   Cardiac Enzymes: No results found for this basename: CKTOTAL, CKMB, CKMBINDEX, TROPONINI,  in the last 168 hours BNP: BNP (last 3 results) No results found for this basename: PROBNP,  in the last 8760  hours CBG:  Recent Labs Lab 07/30/13 0744 07/30/13 1113 07/30/13 1635 07/30/13 2031 07/31/13 0803  GLUCAP 140* 119* 102* 106* 73       Signed:  Dalicia Kisner  [facility] 07/31/2013, 11:05 AM

## 2013-08-03 ENCOUNTER — Emergency Department (HOSPITAL_COMMUNITY): Payer: Medicare Other

## 2013-08-03 ENCOUNTER — Encounter (HOSPITAL_COMMUNITY): Payer: Self-pay | Admitting: *Deleted

## 2013-08-03 ENCOUNTER — Inpatient Hospital Stay (HOSPITAL_COMMUNITY)
Admission: EM | Admit: 2013-08-03 | Discharge: 2013-08-07 | DRG: 064 | Disposition: A | Discharge status: Skilled Nursing Facility | Payer: Medicare Other | Attending: Internal Medicine | Admitting: Internal Medicine

## 2013-08-03 DIAGNOSIS — E876 Hypokalemia: Secondary | ICD-10-CM | POA: Diagnosis present

## 2013-08-03 DIAGNOSIS — E1169 Type 2 diabetes mellitus with other specified complication: Secondary | ICD-10-CM | POA: Diagnosis present

## 2013-08-03 DIAGNOSIS — R4182 Altered mental status, unspecified: Secondary | ICD-10-CM

## 2013-08-03 DIAGNOSIS — I4891 Unspecified atrial fibrillation: Secondary | ICD-10-CM | POA: Diagnosis present

## 2013-08-03 DIAGNOSIS — E119 Type 2 diabetes mellitus without complications: Secondary | ICD-10-CM | POA: Diagnosis present

## 2013-08-03 DIAGNOSIS — R410 Disorientation, unspecified: Secondary | ICD-10-CM

## 2013-08-03 DIAGNOSIS — G9340 Encephalopathy, unspecified: Secondary | ICD-10-CM | POA: Diagnosis present

## 2013-08-03 DIAGNOSIS — I2589 Other forms of chronic ischemic heart disease: Secondary | ICD-10-CM | POA: Diagnosis present

## 2013-08-03 DIAGNOSIS — R509 Fever, unspecified: Secondary | ICD-10-CM | POA: Diagnosis present

## 2013-08-03 DIAGNOSIS — I639 Cerebral infarction, unspecified: Secondary | ICD-10-CM | POA: Diagnosis present

## 2013-08-03 DIAGNOSIS — I214 Non-ST elevation (NSTEMI) myocardial infarction: Secondary | ICD-10-CM

## 2013-08-03 DIAGNOSIS — G8190 Hemiplegia, unspecified affecting unspecified side: Secondary | ICD-10-CM | POA: Diagnosis present

## 2013-08-03 DIAGNOSIS — E162 Hypoglycemia, unspecified: Secondary | ICD-10-CM | POA: Diagnosis present

## 2013-08-03 DIAGNOSIS — R270 Ataxia, unspecified: Secondary | ICD-10-CM

## 2013-08-03 DIAGNOSIS — R42 Dizziness and giddiness: Secondary | ICD-10-CM

## 2013-08-03 DIAGNOSIS — B961 Klebsiella pneumoniae [K. pneumoniae] as the cause of diseases classified elsewhere: Secondary | ICD-10-CM | POA: Diagnosis present

## 2013-08-03 DIAGNOSIS — E785 Hyperlipidemia, unspecified: Secondary | ICD-10-CM | POA: Diagnosis present

## 2013-08-03 DIAGNOSIS — R7989 Other specified abnormal findings of blood chemistry: Secondary | ICD-10-CM | POA: Diagnosis present

## 2013-08-03 DIAGNOSIS — I6340 Cerebral infarction due to embolism of unspecified cerebral artery: Principal | ICD-10-CM | POA: Diagnosis present

## 2013-08-03 DIAGNOSIS — E109 Type 1 diabetes mellitus without complications: Secondary | ICD-10-CM

## 2013-08-03 DIAGNOSIS — G8929 Other chronic pain: Secondary | ICD-10-CM | POA: Diagnosis present

## 2013-08-03 DIAGNOSIS — Z66 Do not resuscitate: Secondary | ICD-10-CM | POA: Diagnosis present

## 2013-08-03 DIAGNOSIS — M129 Arthropathy, unspecified: Secondary | ICD-10-CM | POA: Diagnosis present

## 2013-08-03 DIAGNOSIS — I255 Ischemic cardiomyopathy: Secondary | ICD-10-CM | POA: Diagnosis present

## 2013-08-03 DIAGNOSIS — M549 Dorsalgia, unspecified: Secondary | ICD-10-CM | POA: Diagnosis present

## 2013-08-03 DIAGNOSIS — M1990 Unspecified osteoarthritis, unspecified site: Secondary | ICD-10-CM

## 2013-08-03 DIAGNOSIS — I2510 Atherosclerotic heart disease of native coronary artery without angina pectoris: Secondary | ICD-10-CM | POA: Diagnosis present

## 2013-08-03 DIAGNOSIS — N390 Urinary tract infection, site not specified: Secondary | ICD-10-CM | POA: Diagnosis present

## 2013-08-03 DIAGNOSIS — I10 Essential (primary) hypertension: Secondary | ICD-10-CM | POA: Diagnosis present

## 2013-08-03 DIAGNOSIS — Z794 Long term (current) use of insulin: Secondary | ICD-10-CM

## 2013-08-03 DIAGNOSIS — Z951 Presence of aortocoronary bypass graft: Secondary | ICD-10-CM

## 2013-08-03 DIAGNOSIS — Z87440 Personal history of urinary (tract) infections: Secondary | ICD-10-CM

## 2013-08-03 DIAGNOSIS — I679 Cerebrovascular disease, unspecified: Secondary | ICD-10-CM

## 2013-08-03 DIAGNOSIS — S42209A Unspecified fracture of upper end of unspecified humerus, initial encounter for closed fracture: Secondary | ICD-10-CM

## 2013-08-03 DIAGNOSIS — R001 Bradycardia, unspecified: Secondary | ICD-10-CM

## 2013-08-03 DIAGNOSIS — I252 Old myocardial infarction: Secondary | ICD-10-CM

## 2013-08-03 DIAGNOSIS — D649 Anemia, unspecified: Secondary | ICD-10-CM | POA: Diagnosis present

## 2013-08-03 DIAGNOSIS — K219 Gastro-esophageal reflux disease without esophagitis: Secondary | ICD-10-CM

## 2013-08-03 DIAGNOSIS — R55 Syncope and collapse: Secondary | ICD-10-CM

## 2013-08-03 HISTORY — DX: Cerebral infarction, unspecified: I63.9

## 2013-08-03 HISTORY — DX: Hyperlipidemia, unspecified: E78.5

## 2013-08-03 HISTORY — DX: Unspecified atrial fibrillation: I48.91

## 2013-08-03 LAB — CBC WITH DIFFERENTIAL/PLATELET
Band Neutrophils: 0 % (ref 0–10)
Blasts: 0 %
Eosinophils Absolute: 0 10*3/uL (ref 0.0–0.7)
HCT: 37.4 % (ref 36.0–46.0)
MCV: 91.4 fL (ref 78.0–100.0)
Metamyelocytes Relative: 0 %
Monocytes Absolute: 0.3 10*3/uL (ref 0.1–1.0)
Monocytes Relative: 2 % — ABNORMAL LOW (ref 3–12)
RDW: 12.8 % (ref 11.5–15.5)
WBC: 14.1 10*3/uL — ABNORMAL HIGH (ref 4.0–10.5)

## 2013-08-03 LAB — BLOOD GAS, ARTERIAL
Acid-Base Excess: 0.7 mmol/L (ref 0.0–2.0)
Drawn by: 22223
O2 Content: 1.5 L/min
TCO2: 20.7 mmol/L (ref 0–100)
pCO2 arterial: 29.8 mmHg — ABNORMAL LOW (ref 35.0–45.0)
pH, Arterial: 7.509 — ABNORMAL HIGH (ref 7.350–7.450)
pO2, Arterial: 87.2 mmHg (ref 80.0–100.0)

## 2013-08-03 LAB — BASIC METABOLIC PANEL
GFR calc Af Amer: 86 mL/min — ABNORMAL LOW (ref >90)
GFR calc non Af Amer: 75 mL/min — ABNORMAL LOW (ref >90)
Glucose, Bld: 92 mg/dL (ref 70–99)
Potassium: 3.6 mEq/L (ref 3.5–5.1)
Sodium: 135 mEq/L (ref 135–145)

## 2013-08-03 LAB — URINALYSIS, ROUTINE W REFLEX MICROSCOPIC
Leukocytes, UA: NEGATIVE
Nitrite: NEGATIVE
Specific Gravity, Urine: 1.02 (ref 1.005–1.030)
Urobilinogen, UA: 0.2 mg/dL (ref 0.0–1.0)

## 2013-08-03 LAB — URINE MICROSCOPIC-ADD ON

## 2013-08-03 LAB — RAPID URINE DRUG SCREEN, HOSP PERFORMED
Cocaine: NOT DETECTED
Opiates: NOT DETECTED
Tetrahydrocannabinol: NOT DETECTED

## 2013-08-03 LAB — GLUCOSE, CAPILLARY
Glucose-Capillary: 90 mg/dL (ref 70–99)
Glucose-Capillary: 25 mg/dL — CL (ref 70–99)

## 2013-08-03 MED ORDER — ACETAMINOPHEN 325 MG RE SUPP
RECTAL | Status: AC
  Administered 2013-08-03: 650 mg via RECTAL
  Filled 2013-08-03: qty 2

## 2013-08-03 MED ORDER — ACETAMINOPHEN 650 MG RE SUPP
650.0000 mg | Freq: Once | RECTAL | Status: AC
  Administered 2013-08-03: 650 mg via RECTAL

## 2013-08-03 MED ORDER — SODIUM CHLORIDE 0.9 % IV BOLUS (SEPSIS)
500.0000 mL | Freq: Once | INTRAVENOUS | Status: AC
  Administered 2013-08-03: 500 mL via INTRAVENOUS

## 2013-08-03 MED ORDER — DEXTROSE 50 % IV SOLN: 25.0000 mL | Freq: Once | INTRAVENOUS | Status: AC | PRN

## 2013-08-03 MED ORDER — BIOTENE DRY MOUTH MT LIQD
15.0000 mL | Freq: Four times a day (QID) | OROMUCOSAL | Status: DC
  Administered 2013-08-04 – 2013-08-07 (×14): 15 mL via OROMUCOSAL

## 2013-08-03 MED ORDER — SODIUM CHLORIDE 0.9 % IJ SOLN
3.0000 mL | Freq: Two times a day (BID) | INTRAMUSCULAR | Status: DC
  Administered 2013-08-05 – 2013-08-07 (×4): 3 mL via INTRAVENOUS

## 2013-08-03 MED ORDER — CHLORHEXIDINE GLUCONATE 0.12 % MT SOLN
15.0000 mL | Freq: Two times a day (BID) | OROMUCOSAL | Status: DC
  Administered 2013-08-03 – 2013-08-07 (×8): 15 mL via OROMUCOSAL
  Filled 2013-08-03 (×8): qty 15

## 2013-08-03 MED ORDER — SODIUM CHLORIDE 0.9 % IV SOLN
1.0000 g | INTRAVENOUS | Status: AC
  Administered 2013-08-03: 1 g via INTRAVENOUS
  Filled 2013-08-03: qty 1

## 2013-08-03 MED ORDER — DEXTROSE 50 % IV SOLN: 50.0000 mL | Freq: Once | INTRAVENOUS | Status: AC | PRN

## 2013-08-03 MED ORDER — THIAMINE HCL 100 MG/ML IJ SOLN
100.0000 mg | Freq: Every day | INTRAMUSCULAR | Status: DC
  Administered 2013-08-03 – 2013-08-07 (×5): 100 mg via INTRAVENOUS
  Filled 2013-08-03 (×5): qty 2

## 2013-08-03 MED ORDER — VANCOMYCIN HCL 10 G IV SOLR
1500.0000 mg | INTRAVENOUS | Status: AC
  Administered 2013-08-03: 1500 mg via INTRAVENOUS
  Filled 2013-08-03: qty 1500

## 2013-08-03 MED ORDER — SODIUM CHLORIDE 0.9 % IV SOLN
INTRAVENOUS | Status: DC
  Administered 2013-08-03: 18:00:00 via INTRAVENOUS

## 2013-08-03 MED ORDER — ONDANSETRON HCL 4 MG PO TABS: 4.0000 mg | ORAL_TABLET | Freq: Four times a day (QID) | ORAL | Status: DC | PRN

## 2013-08-03 MED ORDER — SODIUM CHLORIDE 0.9 % IV SOLN
500.0000 mg | Freq: Three times a day (TID) | INTRAVENOUS | Status: DC
  Filled 2013-08-03 (×2): qty 0.5

## 2013-08-03 MED ORDER — VANCOMYCIN HCL IN DEXTROSE 750-5 MG/150ML-% IV SOLN
750.0000 mg | Freq: Two times a day (BID) | INTRAVENOUS | Status: DC
  Filled 2013-08-03: qty 150

## 2013-08-03 MED ORDER — ACETAMINOPHEN 650 MG RE SUPP: 650.0000 mg | Freq: Four times a day (QID) | RECTAL | Status: DC | PRN

## 2013-08-03 MED ORDER — DEXTROSE 50 % IV SOLN
INTRAVENOUS | Status: AC
  Administered 2013-08-03: 50 mL
  Filled 2013-08-03: qty 50

## 2013-08-03 MED ORDER — ONDANSETRON HCL 4 MG/2ML IJ SOLN
4.0000 mg | Freq: Four times a day (QID) | INTRAMUSCULAR | Status: DC | PRN
  Administered 2013-08-05 (×2): 4 mg via INTRAVENOUS
  Filled 2013-08-03 (×2): qty 2

## 2013-08-03 MED ORDER — DEXTROSE-NACL 5-0.45 % IV SOLN
INTRAVENOUS | Status: DC
  Administered 2013-08-03: 22:00:00 via INTRAVENOUS

## 2013-08-03 MED ORDER — ACETAMINOPHEN 325 MG PO TABS: 650.0000 mg | ORAL_TABLET | Freq: Four times a day (QID) | ORAL | Status: DC | PRN

## 2013-08-03 NOTE — ED Notes (Signed)
Family called pt yesterday with no answer.  Family checked on pt today around 1530 and found pt in chair.  Pt was unresponsive, cbg upon EMS arrival 40.  Amp of D50 given, cbg up to 153.  Pt alert, does not answer questions, moaning.  Pt has emesis on clothing and pants saturated with urine.

## 2013-08-03 NOTE — ED Provider Notes (Addendum)
CSN: [id number]     Arrival date & time 08/03/13  1647 History  This chart was scribed for Flint Melter, MD by Karle Plumber, ED Scribe. This patient was seen in room APA14/APA14 and the patient's care was started at 4:48 PM.  Chief Complaint  Patient presents with  . Altered Mental Status  Level 5 Caveat-Full history could not be obtained due to pt being unresponsive and AMS. The history is provided by the EMS personnel. No language interpreter was used.   HPI Comments:  Alicia Mason is a 79 y.o. female brought in by ambulance, who presents to the Emergency Department unresponsive. The paramedics report that the pt lives alone and the family states the pt was last spoken to at 3:30 PM yesterday and was normal. The pt's family contacted the sheriff's department and went to check on her today PTA after not being able to reach her by phone. The sheriff broke into the house and found her in a sitting position with obvious emesis and unresponsive. EMS report that the sheriff laid her on the floor prior to their arrival. When paramedics arrived she had a BS of 40. Paramedics state that one amp of D50 was given and improved glucose level, however pt was still unresponsive. Paramedics report she is taking ASA, pravastatin, metoprolol, quinapril, Lantus and Percocet. Pt has h/o CAD, HTN, and Lantus. She has h/o of CABG, hysterectomy, and cataract surgery.  Past Medical History  Diagnosis Date  . Essential hypertension, benign   . Type 2 diabetes mellitus   . Coronary atherosclerosis of native coronary artery     Multivessel  . Closed fracture of right humerus     Managed conservatively 2011  . Cerebrovascular disease   . Seasonal allergies   . Arthritis   . Vertigo   . Ischemic cardiomyopathy     EF 40% per Echo 06/2011.  Marland Kitchen Near syncope   . NSTEMI (non-ST elevated myocardial infarction)     06/2011.  . Diabetes mellitus    Past Surgical History  Procedure Laterality Date  . Coronary  artery bypass graft  1999    LIMA to LAD, SVG to OM1 and OM2, SVG to PDA  . Cataract surgery    . Abdominal hysterectomy      WITH BSO.   Family History  Problem Relation Age of Onset  . CAD Sister     Died with MI age 56  . Pneumonia Mother   . Arthritis Other   . Coronary artery disease Other   . Diabetes Other   . Cancer Other    History  Substance Use Topics  . Smoking status: Never Smoker   . Smokeless tobacco: Never Used  . Alcohol Use: No   OB History   Grav Para Term Preterm Abortions TAB SAB Ect Mult Living                 Review of Systems  Unable to perform ROS  Level 5 Caveat- Full history could not be obtained due to pt being unresponsive and AMS. Allergies  Cephalexin  Home Medications   Current Outpatient Rx  Name  Route  Sig  Dispense  Refill  . ALPRAZolam (XANAX) 0.5 MG tablet   Oral   Take 0.5 mg by mouth every 8 (eight) hours as needed for anxiety.          . ciprofloxacin (CIPRO) 500 MG tablet   Oral   Take 0.5 tablets (250 mg total) by  mouth 2 (two) times daily.   12 tablet   0   . insulin glargine (LANTUS) 100 UNIT/ML injection   Subcutaneous   Inject 0.25 mLs (25 Units total) into the skin at bedtime.   10 mL   12   . metoprolol succinate (TOPROL-XL) 100 MG 24 hr tablet   Oral   Take 100 mg by mouth daily. Take with or immediately following a meal.         . MYRBETRIQ 50 MG TB24 tablet   Oral   Take 1 tablet by mouth daily.         . pravastatin (PRAVACHOL) 10 MG tablet   Oral   Take 10 mg by mouth daily.           . quinapril (ACCUPRIL) 40 MG tablet   Oral   Take 40 mg by mouth daily.           . meclizine (ANTIVERT) 12.5 MG tablet   Oral   Take 12.5 mg by mouth daily as needed. Motion sickness           Triage Vitals: BP 165/68  Pulse 85  Temp(Src) 101.7 F (38.7 C) (Rectal)  SpO2 98% Physical Exam  Nursing note and vitals reviewed. Constitutional: She appears well-developed.  Frail, elderly   HENT:  Head: Normocephalic and atraumatic.  Eyes: Right eye exhibits no discharge. Left eye exhibits no discharge. No scleral icterus.  Left pupil irregular 3x4 mm non-reactive. Right pupil irregular 5 mm minimally reactive.  Neck: No tracheal deviation present.  Cardiovascular: Normal rate, regular rhythm and normal heart sounds.   Pulmonary/Chest: No stridor. No respiratory distress. She has wheezes. She has no rales.  Abdominal: Bowel sounds are normal. She exhibits no distension and no mass.  Musculoskeletal: She exhibits no edema and no tenderness.  Neurological: She has normal strength.  Squeezing with right hand on command, but not left hand. Left sided flaccidity. Minimally responsive with no verbal responsiveness.   Skin:  Lower legs have anterior excoriations and ecchymosis.   ED Course  LUMBAR PUNCTURE Date/Time: 08/03/2013 7:56 PM Performed by: Effie Shy, Shylie Polo L Authorized by: Flint Melter Consent: Verbal consent obtained. written consent obtained. Risks and benefits: risks, benefits and alternatives were discussed Consent given by: Grand daughter. Patient understanding: patient states understanding of the procedure being performed Patient consent: the patient's understanding of the procedure matches consent given Procedure consent: procedure consent matches procedure scheduled Relevant documents: relevant documents present and verified Test results: test results available and properly labeled Site marked: the operative site was marked Imaging studies: imaging studies not available Required items: required blood products, implants, devices, and special equipment available Patient identity confirmed: arm band and provided demographic data Indications: evaluation for infection Anesthesia: local infiltration Local anesthetic: lidocaine 1% with epinephrine Anesthetic total: 3 ml Patient sedated: no Preparation: Patient was prepped and draped in the usual sterile  fashion. Lumbar space: L4-L5 interspace (And L3, L4) Patient's position: right lateral decubitus Needle gauge: 20 Needle type: spinal needle - Quincke tip Needle length: 3.5 in Number of attempts: 3 (2 levels) Fluid appearance: No fluid obtained. Tubes of fluid: None. Total volume (ml): None. Post-procedure: site cleaned and adhesive bandage applied Patient tolerance: Patient tolerated the procedure well with no immediate complications. Comments: I informed Dr. Rito Ehrlich of the failed attempt.   (including critical care time) DIAGNOSTIC STUDIES: Oxygen Saturation is 98% on 2 L Box, normal by my interpretation.   COORDINATION OF CARE: 4:54 PM- Will  obtain a CT scan. Level 5 caveat.  5:48 PM- Right femoral stick to obtain blood for testing after several sticks by ancillary staff.  7:20 PM- Attempted lumbar puncture. I advised Dr. Rito Ehrlich on to treat empirically for meningitis pending LP under fluoroscopy, tomorrow.     Date: 06/02/13  Rate: 104  Rhythm: sinus tachycardia  QRS Axis: left  PR and QT Intervals: PR prolonged  ST/T Wave abnormalities: normal  PR and QRS Conduction Disutrbances:left bundle branch block  Narrative Interpretation:   Old EKG Reviewed: changes noted- 07/29/13- rate faster   CRITICAL CARE Performed by: Dr. Flint Melter  Total critical care time: 50 min. Critical care time was exclusive of separately billable procedures and treating other patients. Critical care was necessary to treat or prevent imminent or life-threatening deterioration. Critical care was time spent personally by me on the following activities: development of treatment plan with patient and/or surrogate as well as nursing, discussions with consultants, evaluation of patient's response to treatment, examination of patient, obtaining history from patient or surrogate, ordering and performing treatments and interventions, ordering and review of laboratory studies, ordering and review of  radiographic studies, pulse oximetry and re-evaluation of patient's condition.  Medications  0.9 %  sodium chloride infusion ( Intravenous New Bag/Given 08/03/13 1754)  sodium chloride 0.9 % bolus 500 mL (0 mLs Intravenous Stopped 08/03/13 1849)  acetaminophen (TYLENOL) suppository 650 mg (650 mg Rectal Given 08/03/13 1734)  acetaminophen (TYLENOL) suppository 650 mg (650 mg Rectal Given 08/03/13 1841)   Labs Review Labs Reviewed  CBC WITH DIFFERENTIAL - Abnormal; Notable for the following:    WBC 14.1 (*)    Neutrophils Relative % 88 (*)    Lymphocytes Relative 10 (*)    Monocytes Relative 2 (*)    Neutro Abs 12.4 (*)    All other components within normal limits  BASIC METABOLIC PANEL - Abnormal; Notable for the following:    GFR calc non Af Amer 75 (*)    GFR calc Af Amer 86 (*)    All other components within normal limits  URINALYSIS, ROUTINE W REFLEX MICROSCOPIC - Abnormal; Notable for the following:    Glucose, UA 250 (*)    Hgb urine dipstick TRACE (*)    All other components within normal limits  URINE CULTURE  CULTURE, BLOOD (SINGLE)  LACTIC ACID, PLASMA  GLUCOSE, CAPILLARY  URINE MICROSCOPIC-ADD ON  CK   Imaging Review Dg Chest 1 View  08/03/2013   CLINICAL DATA:  Altered mental status  EXAM: CHEST - 1 VIEW  COMPARISON:  None.  FINDINGS: The heart size and mediastinal contours are within normal limits. Status post CABG. No acute infiltrate or pulmonary edema. Bony thorax is stable.  IMPRESSION: No active disease.  Status post CABG again noted.   Electronically Signed   By: Natasha Mead   On: 08/03/2013 17:14   Ct Head Wo Contrast  08/03/2013   CLINICAL DATA:  Altered level of consciousness.  EXAM: CT HEAD WITHOUT CONTRAST  TECHNIQUE: Contiguous axial images were obtained from the base of the skull through the vertex without intravenous contrast.  COMPARISON:  07/29/2013.  FINDINGS: Age related cerebral atrophy, ventriculomegaly and periventricular white matter disease. There  are also remote lacunar-type basal ganglia infarcts.  No extra-axial fluid collections or CT findings for hemispheric infarction an or intracranial hemorrhage.  There is a 2 cm left-sided posterior fossa mass which is slightly hyperdense and is most likely a benign meningioma. It has enlarged since 2005.  Given the patient's age this is probably inconsequential and does not require any further imaging evaluation.  The bony structures are intact. The paranasal sinuses and mastoid air cells are clear. The globes are intact.  IMPRESSION: Age related cerebral atrophy, ventriculomegaly and periventricular white matter disease.  No acute intracranial findings.  2 cm left-sided posterior fossa mass adjacent to the left temporal bone is most likely a benign meningioma.   Electronically Signed   By: Loralie Champagne M.D.   On: 08/03/2013 17:19    MDM   1. Altered mental status   2. Hypoglycemia   3. Febrile illness    Altered mental status with clinical exam consistent with right brain CVA. Febrile illness, with recent UTI. Urinalysis, appears normal today.   Nursing Notes Reviewed/ Care Coordinated, and agree without changes. Applicable Imaging Reviewed.  Interpretation of Laboratory Data incorporated into ED treatment  Plan: Admit  I personally performed the services described in this documentation, which was scribed in my presence. The recorded information has been reviewed and is accurate.    Flint Melter, MD 08/03/13 2003  Flint Melter, MD 08/03/13 [phone]

## 2013-08-03 NOTE — H&P (Signed)
[facility] History and Physical  Alicia Mason:[id number] DOB: 06/30/1922 DOA: 08/03/2013   PCP: Cassell Smiles., MD  Specialists: She is followed by pain management specialist.  Chief Complaint: Unresponsiveness, low blood sugar, fever  HPI: Alicia Mason is a 79 y.o. female with a past medical history of, diabetes, coronary artery disease, chronic back pain, who was recently discharged on September 11 after being treated for hypoglycemia and a UTI. She is on ciprofloxacin for this UTI. She was brought in today because of unresponsiveness. According to the granddaughter, who is at her bedside, patient was doing well after discharge from previous hospitalization. Family was calling in and checking on her every day and she seemed to be doing quite well. And, then today at 3 PM the patient's daughter couldn't get a hold of her and then subsequently, they went to her house, but couldn't get the door open. 911 was called and they went in and found the patient sitting on the chair and was unresponsive. Blood sugar was 40. She was brought into the hospital. During previous discharge her dose of Lantus was reduced from 35 units to 25 units daily. She was found to have a fever of 103 in the emergency department. No clear source for her fever is found at this time. Patient is unresponsive, unable to provide any history. Granddaughter denies any use of oral pain medication. Denies any increase in the dose of her psychotropic agents.  Home Medications: Prior to Admission medications   Medication Sig Start Date End Date Taking? Authorizing Provider  ALPRAZolam Prudy Feeler) 0.5 MG tablet Take 0.5 mg by mouth every 8 (eight) hours as needed for anxiety.  07/25/13  Yes Historical Provider, MD  ciprofloxacin (CIPRO) 500 MG tablet Take 0.5 tablets (250 mg total) by mouth 2 (two) times daily. 07/31/13  Yes Nishant Dhungel, MD  insulin glargine (LANTUS) 100 UNIT/ML injection Inject 0.25 mLs (25 Units total)  into the skin at bedtime. 07/31/13  Yes Nishant Dhungel, MD  metoprolol succinate (TOPROL-XL) 100 MG 24 hr tablet Take 100 mg by mouth daily. Take with or immediately following a meal.   Yes Historical Provider, MD  MYRBETRIQ 50 MG TB24 tablet Take 1 tablet by mouth daily. 07/03/13  Yes Historical Provider, MD  pravastatin (PRAVACHOL) 10 MG tablet Take 10 mg by mouth daily.     Yes Historical Provider, MD  quinapril (ACCUPRIL) 40 MG tablet Take 40 mg by mouth daily.     Yes Historical Provider, MD  meclizine (ANTIVERT) 12.5 MG tablet Take 12.5 mg by mouth daily as needed. Motion sickness     Historical Provider, MD    Allergies:  Allergies  Allergen Reactions  . Cephalexin     NOT SURE OF THE REACTION, BUT WAS TOLD NOT TO TAKE IT. Granddaughter mentioned that patient had hives, rash. Denies anaphylactic reaction.    Past Medical History: Past Medical History  Diagnosis Date  . Essential hypertension, benign   . Type 2 diabetes mellitus   . Coronary atherosclerosis of native coronary artery     Multivessel  . Closed fracture of right humerus     Managed conservatively 2011  . Cerebrovascular disease   . Seasonal allergies   . Arthritis   . Vertigo   . Ischemic cardiomyopathy     EF 40% per Echo 06/2011.  Marland Kitchen Near syncope   . NSTEMI (non-ST elevated myocardial infarction)     06/2011.  . Diabetes mellitus     Past Surgical History  Procedure Laterality Date  . Coronary artery bypass graft  1999    LIMA to LAD, SVG to OM1 and OM2, SVG to PDA  . Cataract surgery    . Abdominal hysterectomy      WITH BSO.    Social History:  reports that she has never smoked. She has never used smokeless tobacco. She reports that she does not drink alcohol or use illicit drugs.  Living Situation: She lives alone Activity Level: Usually independent with daily activities   Family History:  Family History  Problem Relation Age of Onset  . CAD Sister     Died with MI age 73  . Pneumonia Mother    . Arthritis Other   . Coronary artery disease Other   . Diabetes Other   . Cancer Other      Review of Systems - Unable to do due to AMS  Physical Examination  Filed Vitals:   08/03/13 1810 08/03/13 1844 08/03/13 1928 08/03/13 1946  BP:   142/54 152/49  Pulse: 71  80 66  Temp: 102.5 F (39.2 C) 103.1 F (39.5 C) 103 F (39.4 C) 103.1 F (39.5 C)  TempSrc:  Other (Comment) Core (Comment) Core (Comment)  Resp: 20  18   SpO2: 98%  99% 100%    General appearance: appears stated age, distracted, no distress, slowed mentation and uncooperative Head: Normocephalic, without obvious abnormality, atraumatic Eyes: conjunctivae/corneas clear. PERRL, EOM's intact. Throat: lips, mucosa, and tongue normal; teeth and gums normal Neck: no adenopathy, no carotid bruit, no JVD, supple, symmetrical, trachea midline, thyroid not enlarged, symmetric, no tenderness/mass/nodules and soft and supple Resp: Coarse breath sounds bilaterally, without any wheezing or rhonchi, or crackles. Cardio: regular rate and rhythm, S1, S2 normal, no murmur, click, rub or gallop GI: soft, non-tender; bowel sounds normal; no masses,  no organomegaly Extremities: She does have cold feet bilaterally. She has good capillary refill. Pulses: Good radial pulses. Poorly palpable pulses on the foot, but good capillary refill noted Skin: Bruising is noted all over, but this is chronic per the granddaughter. Lymph nodes: Cervical, supraclavicular, and axillary nodes normal. Neurologic: She is unresponsive. She is moaning. No obvious facial droop is noted. Plantars a few walker. Some response to painful stimuli. Perhaps has some left-sided weakness with flaccidity, but difficult to appreciate.  Laboratory Data: Results for orders placed during the hospital encounter of 08/03/13 (from the past 48 hour(s))  GLUCOSE, CAPILLARY     Status: None   Collection Time    08/03/13  4:51 PM      Result Value Range   Glucose-Capillary  90  70 - 99 mg/dL  CBC WITH DIFFERENTIAL     Status: Abnormal   Collection Time    08/03/13  5:52 PM      Result Value Range   WBC 14.1 (*) 4.0 - 10.5 K/uL   RBC 4.09  3.87 - 5.11 MIL/uL   Hemoglobin 13.3  12.0 - 15.0 g/dL   HCT 78.2  95.6 - 21.3 %   MCV 91.4  78.0 - 100.0 fL   MCH 32.5  26.0 - 34.0 pg   MCHC 35.6  30.0 - 36.0 g/dL   RDW 08.6  57.8 - 46.9 %   Platelets 242  150 - 400 K/uL   Neutrophils Relative % 88 (*) 43 - 77 %   Lymphocytes Relative 10 (*) 12 - 46 %   Monocytes Relative 2 (*) 3 - 12 %   Eosinophils Relative 0  0 - 5 %   Basophils Relative 0  0 - 1 %   Band Neutrophils 0  0 - 10 %   Metamyelocytes Relative 0     Myelocytes 0     Promyelocytes Absolute 0     Blasts 0     nRBC 0  0 /100 WBC   Neutro Abs 12.4 (*) 1.7 - 7.7 K/uL   Lymphs Abs 1.4  0.7 - 4.0 K/uL   Monocytes Absolute 0.3  0.1 - 1.0 K/uL   Eosinophils Absolute 0.0  0.0 - 0.7 K/uL   Basophils Absolute 0.0  0.0 - 0.1 K/uL  BASIC METABOLIC PANEL     Status: Abnormal   Collection Time    08/03/13  5:52 PM      Result Value Range   Sodium 135  135 - 145 mEq/L   Potassium 3.6  3.5 - 5.1 mEq/L   Chloride 101  96 - 112 mEq/L   CO2 22  19 - 32 mEq/L   Glucose, Bld 92  70 - 99 mg/dL   BUN 21  6 - 23 mg/dL   Creatinine, Ser 2.13  0.50 - 1.10 mg/dL   Calcium 9.1  8.4 - 08.6 mg/dL   GFR calc non Af Amer 75 (*) >90 mL/min   GFR calc Af Amer 86 (*) >90 mL/min   Comment: (NOTE)     The eGFR has been calculated using the CKD EPI equation.     This calculation has not been validated in all clinical situations.     eGFR's persistently <90 mL/min signify possible Chronic Kidney     Disease.  LACTIC ACID, PLASMA     Status: None   Collection Time    08/03/13  5:52 PM      Result Value Range   Lactic Acid, Venous 1.9  0.5 - 2.2 mmol/L  URINALYSIS, ROUTINE W REFLEX MICROSCOPIC     Status: Abnormal   Collection Time    08/03/13  6:00 PM      Result Value Range   Color, Urine YELLOW  YELLOW   APPearance  CLEAR  CLEAR   Specific Gravity, Urine 1.020  1.005 - 1.030   pH 6.0  5.0 - 8.0   Glucose, UA 250 (*) NEGATIVE mg/dL   Hgb urine dipstick TRACE (*) NEGATIVE   Bilirubin Urine NEGATIVE  NEGATIVE   Ketones, ur NEGATIVE  NEGATIVE mg/dL   Protein, ur NEGATIVE  NEGATIVE mg/dL   Urobilinogen, UA 0.2  0.0 - 1.0 mg/dL   Nitrite NEGATIVE  NEGATIVE   Leukocytes, UA NEGATIVE  NEGATIVE  URINE MICROSCOPIC-ADD ON     Status: None   Collection Time    08/03/13  6:00 PM      Result Value Range   Squamous Epithelial / LPF RARE  RARE   WBC, UA 0-2  <3 WBC/hpf   RBC / HPF 0-2  <3 RBC/hpf    Radiology Reports: Dg Chest 1 View  08/03/2013   CLINICAL DATA:  Altered mental status  EXAM: CHEST - 1 VIEW  COMPARISON:  None.  FINDINGS: The heart size and mediastinal contours are within normal limits. Status post CABG. No acute infiltrate or pulmonary edema. Bony thorax is stable.  IMPRESSION: No active disease.  Status post CABG again noted.   Electronically Signed   By: Natasha Mead   On: 08/03/2013 17:14   Ct Head Wo Contrast  08/03/2013   CLINICAL DATA:  Altered level of consciousness.  EXAM:  CT HEAD WITHOUT CONTRAST  TECHNIQUE: Contiguous axial images were obtained from the base of the skull through the vertex without intravenous contrast.  COMPARISON:  07/29/2013.  FINDINGS: Age related cerebral atrophy, ventriculomegaly and periventricular white matter disease. There are also remote lacunar-type basal ganglia infarcts.  No extra-axial fluid collections or CT findings for hemispheric infarction an or intracranial hemorrhage.  There is a 2 cm left-sided posterior fossa mass which is slightly hyperdense and is most likely a benign meningioma. It has enlarged since 2005. Given the patient's age this is probably inconsequential and does not require any further imaging evaluation.  The bony structures are intact. The paranasal sinuses and mastoid air cells are clear. The globes are intact.  IMPRESSION: Age related  cerebral atrophy, ventriculomegaly and periventricular white matter disease.  No acute intracranial findings.  2 cm left-sided posterior fossa mass adjacent to the left temporal bone is most likely a benign meningioma.   Electronically Signed   By: Loralie Champagne M.D.   On: 08/03/2013 17:19    Electrocardiogram: EKG shows sinus tachycardia at 104 beats per minute. Left axis deviation. Left bundle branch block. No acute ST or T-wave changes. The LBBB is old.  Problem List  Principal Problem:   Acute encephalopathy Active Problems:   CAD (coronary artery disease)   Hx of CABG   DM type 2 (diabetes mellitus, type 2)   UTI (lower urinary tract infection)   Fever   Hypoglycemia   Assessment: This is a 79 year old, Caucasian female, who was brought in for, unresponsiveness. She has acute encephalopathy with fever and hyperglycemia. Etiology for her fever is not entirely clear. Differential diagnosis is very broad. Granddaughter denies that the patient takes more of her psychotropic agents that she should. There is also concern about possible left-sided weakness.  Plan: #1 fever: LP was attempted by the ED physician. However, he was unsuccessful despite 3 attempts. Patient does have chronic back pain and has scoliosis, which is probably the reason for the inability to do the LP. We will order an LP under fluoroscopy in the morning. The granddaughter would like to think about it through the night. Since meningitis is a distinct possibility, due to her altered mental status, we will place her on vancomycin. She does have allergy to cephalosporins. We will place her on meropenem as a second line agent. Blood cultures will be followed up on. Cooling blankets will be applied. She'll be monitored in the intensive care unit.  #2 acute encephalopathy with possible left-sided weakness: Most likely due to the fever and hyperglycemia. CT head was unremarkable. We'll check ABG. MRI will be ordered as  well.  #3 hyperglycemia in the setting of diabetes, type II: Despite a lower dose of Lantus she has again become hypoglycemic. We will place her on a D5 infusion for now. Monitor CBGs closely. Her HbA1c checked just a few days ago, was 5.7. No insulin for now.  #4 chronic back pain: The granddaughter is very clear that this hasn't gotten any worse recently. She tells me that she had some kind of imaging studies within the last 6-8 months. We will hold off on any back imaging for now. If the patient continues to have persistent fever without any clear source this may have to be revisited.  #5 history of coronary artery disease, status post bypass: EKG shows stable findings except for the tachycardia. We will check a troponin.   #6 recent UTI with Klebsiella: Sensitivities have been reviewed. Antibiotics prescribed currently,  should cover this organism.  #7 cold feet: This is bilateral. She does have CAD and could have peripheral vascular disease. She good does have good capillary refill. Continue to monitor closely. Consider arterial Dopplers if this does not improve with hydration  The granddaughter would like to be kept informed of all the test results. She also wants to think again about LP under fluoroscopy.   DVT Prophylaxis: SCDs for now. Anticoagulants can be initiated after LP Code Status: Discussed with the granddaughter. She tells me that patient would not want life support and so she will be DO NOT RESUSCITATE Family Communication: Discussed with the granddaughter, Eunice Blase. Phone number [phone]  Disposition Plan: Admit to ICU.   Further management decisions will depend on results of further testing and patient's response to treatment.  [facility]  [facility] Pager [phone]  If 7PM-7AM, please contact night-coverage [email or web address] Password [facility]  08/03/2013, 8:08 PM

## 2013-08-03 NOTE — ED Notes (Signed)
Pt now unresponsive with snoring respirations.  Vitals stable.  Family at bedside.

## 2013-08-03 NOTE — ED Notes (Signed)
MD(Dr. Barnie Del) at bedside. Daughter is in the room at this time.

## 2013-08-04 ENCOUNTER — Inpatient Hospital Stay (HOSPITAL_COMMUNITY): Payer: Medicare Other

## 2013-08-04 ENCOUNTER — Encounter (HOSPITAL_COMMUNITY): Payer: Medicare Other

## 2013-08-04 DIAGNOSIS — I639 Cerebral infarction, unspecified: Secondary | ICD-10-CM

## 2013-08-04 DIAGNOSIS — I4891 Unspecified atrial fibrillation: Secondary | ICD-10-CM

## 2013-08-04 DIAGNOSIS — I6350 Cerebral infarction due to unspecified occlusion or stenosis of unspecified cerebral artery: Secondary | ICD-10-CM

## 2013-08-04 HISTORY — DX: Cerebral infarction, unspecified: I63.9

## 2013-08-04 LAB — GLUCOSE, CAPILLARY
Glucose-Capillary: 111 mg/dL — ABNORMAL HIGH (ref 70–99)
Glucose-Capillary: 62 mg/dL — ABNORMAL LOW (ref 70–99)
Glucose-Capillary: 63 mg/dL — ABNORMAL LOW (ref 70–99)
Glucose-Capillary: 64 mg/dL — ABNORMAL LOW (ref 70–99)
Glucose-Capillary: 68 mg/dL — ABNORMAL LOW (ref 70–99)
Glucose-Capillary: 83 mg/dL (ref 70–99)
Glucose-Capillary: 88 mg/dL (ref 70–99)

## 2013-08-04 LAB — COMPREHENSIVE METABOLIC PANEL
ALT: 25 U/L (ref 0–35)
Alkaline Phosphatase: 67 U/L (ref 39–117)
BUN: 20 mg/dL (ref 6–23)
CO2: 25 mEq/L (ref 19–32)
Chloride: 103 mEq/L (ref 96–112)
GFR calc Af Amer: 87 mL/min — ABNORMAL LOW (ref >90)
GFR calc non Af Amer: 75 mL/min — ABNORMAL LOW (ref >90)
Glucose, Bld: 108 mg/dL — ABNORMAL HIGH (ref 70–99)
Potassium: 3.1 mEq/L — ABNORMAL LOW (ref 3.5–5.1)
Total Bilirubin: 0.6 mg/dL (ref 0.3–1.2)
Total Protein: 5.6 g/dL — ABNORMAL LOW (ref 6.0–8.3)

## 2013-08-04 LAB — CBC
MCHC: 34.6 g/dL (ref 30.0–36.0)
Platelets: 193 10*3/uL (ref 150–400)
RDW: 12.8 % (ref 11.5–15.5)
WBC: 12 10*3/uL — ABNORMAL HIGH (ref 4.0–10.5)

## 2013-08-04 LAB — BASIC METABOLIC PANEL
BUN: 14 mg/dL (ref 6–23)
CO2: 27 mEq/L (ref 19–32)
Chloride: 100 mEq/L (ref 96–112)
Creatinine, Ser: 0.65 mg/dL (ref 0.50–1.10)

## 2013-08-04 LAB — PROTIME-INR
INR: 1.15 (ref 0.00–1.49)
Prothrombin Time: 14.5 seconds (ref 11.6–15.2)

## 2013-08-04 LAB — URINE CULTURE: Colony Count: NO GROWTH

## 2013-08-04 MED ORDER — SODIUM CHLORIDE 0.9 % IV BOLUS (SEPSIS): 500.0000 mL | Freq: Once | INTRAVENOUS | Status: DC

## 2013-08-04 MED ORDER — GLUCOSE-VITAMIN C 4-6 GM-MG PO CHEW: 4.0000 | CHEWABLE_TABLET | ORAL | Status: DC | PRN

## 2013-08-04 MED ORDER — DEXTROSE 50 % IV SOLN
INTRAVENOUS | Status: AC
  Administered 2013-08-03: 50 mL
  Filled 2013-08-04: qty 50

## 2013-08-04 MED ORDER — SODIUM CHLORIDE 0.9 % IJ SOLN
10.0000 mL | Freq: Two times a day (BID) | INTRAMUSCULAR | Status: DC
  Administered 2013-08-04 – 2013-08-06 (×5): 10 mL

## 2013-08-04 MED ORDER — DEXTROSE 10 % IV SOLN
INTRAVENOUS | Status: DC
  Administered 2013-08-04 – 2013-08-05 (×3): via INTRAVENOUS

## 2013-08-04 MED ORDER — DEXTROSE 50 % IV SOLN: 25.0000 mL | Freq: Once | INTRAVENOUS | Status: AC | PRN

## 2013-08-04 MED ORDER — DEXTROSE 50 % IV SOLN
INTRAVENOUS | Status: AC
  Administered 2013-08-04: 25 mL
  Filled 2013-08-04: qty 50

## 2013-08-04 MED ORDER — GLUCOSE 40 % PO GEL
ORAL | Status: AC
  Filled 2013-08-04: qty 0.83

## 2013-08-04 MED ORDER — SODIUM CHLORIDE 0.9 % IV SOLN
2.0000 g | Freq: Two times a day (BID) | INTRAVENOUS | Status: DC
  Administered 2013-08-04 – 2013-08-07 (×7): 2 g via INTRAVENOUS
  Filled 2013-08-04 (×9): qty 2

## 2013-08-04 MED ORDER — SODIUM CHLORIDE 0.9 % IV SOLN
1.0000 g | Freq: Two times a day (BID) | INTRAVENOUS | Status: DC
  Filled 2013-08-04: qty 1

## 2013-08-04 MED ORDER — GLUCAGON HCL (RDNA) 1 MG IJ SOLR: 0.5000 mg | Freq: Once | INTRAMUSCULAR | Status: AC | PRN

## 2013-08-04 MED ORDER — DEXTROSE 5 % IV SOLN: INTRAVENOUS | Status: DC

## 2013-08-04 MED ORDER — GLUCOSE 40 % PO GEL
1.0000 | ORAL | Status: DC | PRN
  Administered 2013-08-04 (×2): 37.5 g via ORAL
  Filled 2013-08-04: qty 1.65
  Filled 2013-08-04: qty 0.83

## 2013-08-04 MED ORDER — DEXTROSE 50 % IV SOLN: 50.0000 mL | Freq: Once | INTRAVENOUS | Status: AC | PRN

## 2013-08-04 MED ORDER — VANCOMYCIN HCL IN DEXTROSE 1-5 GM/200ML-% IV SOLN
1000.0000 mg | Freq: Two times a day (BID) | INTRAVENOUS | Status: DC
  Administered 2013-08-04 – 2013-08-07 (×7): 1000 mg via INTRAVENOUS
  Filled 2013-08-04 (×9): qty 200

## 2013-08-04 MED ORDER — SODIUM CHLORIDE 0.9 % IJ SOLN
10.0000 mL | INTRAMUSCULAR | Status: DC | PRN
  Administered 2013-08-04: 30 mL

## 2013-08-04 MED ORDER — GLUCAGON HCL (RDNA) 1 MG IJ SOLR: 1.0000 mg | Freq: Once | INTRAMUSCULAR | Status: AC | PRN

## 2013-08-04 MED ORDER — ASPIRIN 300 MG RE SUPP
300.0000 mg | Freq: Every day | RECTAL | Status: DC
  Administered 2013-08-04: 300 mg via RECTAL
  Filled 2013-08-04: qty 1

## 2013-08-04 NOTE — Progress Notes (Signed)
UR Chart Review Completed  

## 2013-08-04 NOTE — Progress Notes (Addendum)
ANTIBIOTIC CONSULT NOTE - INITIAL  Pharmacy Consult for Vancomycin and Meropenem Indication: FUO  Allergies  Allergen Reactions  . Cephalexin     NOT SURE OF THE REACTION, BUT WAS TOLD NOT TO TAKE IT. Granddaughter mentioned that patient had hives, rash. Denies anaphylactic reaction.    Patient Measurements: Height: 5\' 3"  (160 cm) Weight: 147 lb 11.3 oz (67 kg) IBW/kg (Calculated) : 52.4  Vital Signs: Temp: 100.4 F (38 C) (09/15 0300) Temp src: Core (Comment) (09/15 0300) BP: 134/71 mmHg (09/15 0200) Pulse Rate: 76 (09/15 0200) Intake/Output from previous day: 09/14 0701 - 09/15 0700 In: 821.7 [I.V.:321.7; IV Piggyback:500] Out: 250 [Urine:250] Intake/Output from this shift: Total I/O In: 821.7 [I.V.:321.7; IV Piggyback:500] Out: 250 [Urine:250]  Labs:  Recent Labs  08/03/13 1752  WBC 14.1*  HGB 13.3  PLT 242  CREATININE 0.70   Estimated Creatinine Clearance: 43.8 ml/min (by C-G formula based on Cr of 0.7). No results found for this basename: Rolm Gala, VANCORANDOM, GENTTROUGH, GENTPEAK, GENTRANDOM, TOBRATROUGH, TOBRAPEAK, TOBRARND, AMIKACINPEAK, AMIKACINTROU, AMIKACIN,  in the last 72 hours   Microbiology: Recent Results (from the past 720 hour(s))  URINE CULTURE     Status: None   Collection Time    07/29/13  9:16 PM      Result Value Range Status   Specimen Description URINE, CLEAN CATCH   Final   Special Requests NONE   Final   Culture  Setup Time     Final   Value: 07/29/2013 22:30     Performed at [facility] Count     Final   Value: >=100,000 COLONIES/ML     Performed at [facility]   Culture     Final   Value: KLEBSIELLA PNEUMONIAE     Note: Previous Result = 60 000 COLONIES/ML CORRECTED RESULTS CALLED TO: CINDY MORRIS ON 07/31/2013 AT 9:32AM HAJAM     Performed at [facility]   Report Status 07/31/2013 FINAL   Final   Organism ID, Bacteria KLEBSIELLA PNEUMONIAE   Final  MRSA PCR SCREENING      Status: None   Collection Time    08/03/13  9:20 PM      Result Value Range Status   MRSA by PCR NEGATIVE  NEGATIVE Final   Comment:            The GeneXpert MRSA Assay (FDA     approved for NASAL specimens     only), is one component of a     comprehensive MRSA colonization     surveillance program. It is not     intended to diagnose MRSA     infection nor to guide or     monitor treatment for     MRSA infections.    Medical History: Past Medical History  Diagnosis Date  . Essential hypertension, benign   . Type 2 diabetes mellitus   . Coronary atherosclerosis of native coronary artery     Multivessel  . Closed fracture of right humerus     Managed conservatively 2011  . Cerebrovascular disease   . Seasonal allergies   . Arthritis   . Vertigo   . Ischemic cardiomyopathy     EF 40% per Echo 06/2011.  Marland Kitchen Near syncope   . NSTEMI (non-ST elevated myocardial infarction)     06/2011.  . Diabetes mellitus     Medications:  reviewed Assessment: 79yo female admitted with fever after being found unresponsive.  Blood sugar was  reportedly 40.  SCr is ok for age & at baseline.  Estimated Creatinine Clearance: 43.8 ml/min (by C-G formula based on Cr of 0.7).  Goal of Therapy:  Trough level 15-20  Plan:  Vancomycin 1500mg  x 1 then 1000mg  IV q12h Check trough at steady state Meropenem 1000mg  IV q12h Monitor labs, renal fxn, and cultures  Valrie Hart A 08/04/2013,4:59 AM

## 2013-08-04 NOTE — Progress Notes (Signed)
[facility] PROGRESS NOTE  Alicia Mason RUE:[id number] DOB: [birth year] DOA: 08/03/2013 PCP: Cassell Smiles., MD   Brief  Narrative 79 y/o female with hx of HTN, DM, CAD who was recently discharged from the hospital when she was admitted with intermittent confusion mild hypoglycemia and UTI. Her insulin dose was adjusted and discharged on ciprofloxacin. On the day of admission family was unable to reach her and called 911 as the door was locked. Patient was found sitting on the chair unresponsive with a blood glucose of 40. She was brought to the hospital found to have a fever of 103F in the ED. She was arousable to commands but clearly had left-sided weakness . An LP was attempted in the ED which was unsuccessful. She was then admitted to ICU with empiric antibiotics.  Overnight she was repeatedly hypoglycemic requiring D10 drip .  A fluoroscopy guided LP was attempted on 9/15 but was unsuccessful . An MRI of the brain done showed an acute right middle cerebral artery infarct .   Assessment/Plan: Acute stroke Patient has residual left hemiparesis with acute ischemic infarct over middle cerebral artery territory. continue ASA per rectum. Patient arousable and follows few command but lethargic.  Check 2-D echo and carotid Doppler. PT valve. Continue n.p.o. for now. -Neurology consulted  Hypoglycemia Patient insulin dose was reduced upon discharge recently . Patient had marked hypoglycemia on presentation and continues to have thicker than drops in her blood glucose Pediapred continue BP and infusion deviated monitor fingersticks closely.  Fever Source unexplained. On ciprofloxacin at home for recent UTI. Continue empiric vancomycin and meropenem. LP unsuccessful on repeated attempts. We will not try again and family doesnot want it either. Follow blood cultures. Patient noted to have hypothermia at this afternoon. MRI rules out for any hypothalamic or pontine infarct.  Afib  appears  new onset. Check 2 D echo. Holding BP meds       Code Status: DNR Family Communication: Discussed in detail with her granddaughter about patient's acute stroke and possible poor outcome. She agrees to monitor for improvement in symptoms over next 24-48 hours and if not improved or declines further she is strongly in favor of hospice  Disposition Plan: Continue to monitor in ICU   Consultants:  Neurology consulted  Procedures:  None  Antibiotics:  NONE  HPI/Subjective: Admission H&P reviewed.  Objective: Filed Vitals:   08/04/13 1505  BP:   Pulse:   Temp: 93.2 F (34 C)  Resp:     Intake/Output Summary (Last 24 hours) at 08/04/13 1651 Last data filed at 08/04/13 1423  Gross per 24 hour  Intake 1051.67 ml  Output    675 ml  Net 376.67 ml   Filed Weights   08/03/13 2130  Weight: 67 kg (147 lb 11.3 oz)    Exam:   General: Elderly female lying in bed lethargic, responds to commands.  HEENT: No pallor, vital mucosa  Chest: Clear to auscultation bilaterally, no added sounds  CVS: S1&S2 irregular, no murmurs rubs or gallop  Abd: soft,NT, ND, BS+  Ext: warm, no edema  CNS: AAOX0, arousal to commands but lethargic, left hemiparesis   Data Reviewed: Basic Metabolic Panel:  Recent Labs Lab 07/29/13 2014 07/30/13 0528 08/03/13 1752 08/04/13 0437  NA 139 143 135 137  K 3.7 3.6 3.6 3.1*  CL 101 105 101 103  CO2 29 31 22 25   GLUCOSE 128* 44* 92 108*  BUN 14 15 21 20   CREATININE 0.80 0.78 0.70 0.68  CALCIUM 9.3 9.4 9.1 8.6   Liver Function Tests:  Recent Labs Lab 08/04/13 0437  AST 52*  ALT 25  ALKPHOS 67  BILITOT 0.6  PROT 5.6*  ALBUMIN 2.8*   No results found for this basename: LIPASE, AMYLASE,  in the last 168 hours No results found for this basename: AMMONIA,  in the last 168 hours CBC:  Recent Labs Lab 07/29/13 2014 07/30/13 0528 08/03/13 1752 08/04/13 0437  WBC 8.2 8.2 14.1* 12.0*  NEUTROABS  --   --  12.4*  --   HGB  13.6 13.1 13.3 11.3*  HCT 39.0 38.0 37.4 32.7*  MCV 92.6 93.6 91.4 93.2  PLT 214 224 242 193   Cardiac Enzymes:  Recent Labs Lab 08/03/13 2222  TROPONINI <0.30   BNP (last 3 results) No results found for this basename: PROBNP,  in the last 8760 hours CBG:  Recent Labs Lab 08/04/13 0649 08/04/13 0738 08/04/13 0843 08/04/13 1023 08/04/13 1315  GLUCAP 63* 112* 97 83 62*    Recent Results (from the past 240 hour(s))  URINE CULTURE     Status: None   Collection Time    07/29/13  9:16 PM      Result Value Range Status   Specimen Description URINE, CLEAN CATCH   Final   Special Requests NONE   Final   Culture  Setup Time     Final   Value: 07/29/2013 22:30     Performed at [facility] Count     Final   Value: >=100,000 COLONIES/ML     Performed at [facility]   Culture     Final   Value: KLEBSIELLA PNEUMONIAE     Note: Previous Result = 60 000 COLONIES/ML CORRECTED RESULTS CALLED TO: CINDY MORRIS ON 07/31/2013 AT 9:32AM HAJAM     Performed at [facility]   Report Status 07/31/2013 FINAL   Final   Organism ID, Bacteria KLEBSIELLA PNEUMONIAE   Final  CULTURE, BLOOD (SINGLE)     Status: None   Collection Time    08/03/13  5:52 PM      Result Value Range Status   Specimen Description ARTERIAL BLOOD FEMORAL   Final   Special Requests BOTTLES DRAWN AEROBIC AND ANAEROBIC 8CC EACH   Final   Culture NO GROWTH 1 DAY   Final   Report Status PENDING   Incomplete  MRSA PCR SCREENING     Status: None   Collection Time    08/03/13  9:20 PM      Result Value Range Status   MRSA by PCR NEGATIVE  NEGATIVE Final   Comment:            The GeneXpert MRSA Assay (FDA     approved for NASAL specimens     only), is one component of a     comprehensive MRSA colonization     surveillance program. It is not     intended to diagnose MRSA     infection nor to guide or     monitor treatment for     MRSA infections.     Studies: Dg Chest 1  View  08/03/2013   CLINICAL DATA:  Altered mental status  EXAM: CHEST - 1 VIEW  COMPARISON:  None.  FINDINGS: The heart size and mediastinal contours are within normal limits. Status post CABG. No acute infiltrate or pulmonary edema. Bony thorax is stable.  IMPRESSION: No active disease.  Status post CABG again  noted.   Electronically Signed   By: Natasha Mead   On: 08/03/2013 17:14   Ct Head Wo Contrast  08/03/2013   CLINICAL DATA:  Altered level of consciousness.  EXAM: CT HEAD WITHOUT CONTRAST  TECHNIQUE: Contiguous axial images were obtained from the base of the skull through the vertex without intravenous contrast.  COMPARISON:  07/29/2013.  FINDINGS: Age related cerebral atrophy, ventriculomegaly and periventricular white matter disease. There are also remote lacunar-type basal ganglia infarcts.  No extra-axial fluid collections or CT findings for hemispheric infarction an or intracranial hemorrhage.  There is a 2 cm left-sided posterior fossa mass which is slightly hyperdense and is most likely a benign meningioma. It has enlarged since 2005. Given the patient's age this is probably inconsequential and does not require any further imaging evaluation.  The bony structures are intact. The paranasal sinuses and mastoid air cells are clear. The globes are intact.  IMPRESSION: Age related cerebral atrophy, ventriculomegaly and periventricular white matter disease.  No acute intracranial findings.  2 cm left-sided posterior fossa mass adjacent to the left temporal bone is most likely a benign meningioma.   Electronically Signed   By: Loralie Champagne M.D.   On: 08/03/2013 17:19   Mr Brain Wo Contrast  08/04/2013   CLINICAL DATA:  Altered mental status. Vomiting.  EXAM: MRI HEAD WITHOUT CONTRAST  TECHNIQUE: Multiplanar, multisequence MR imaging was performed. No intravenous contrast was administered.  COMPARISON:  Head CT 08/03/2013  FINDINGS: Diffusion imaging shows scattered foci of restricted diffusion in the  right middle cerebral artery territory consistent with embolic infarctions. The largest region is a 2.5 cm area in the posterior temporal lobe. The other areas are less than a cm in size and present in the right temporal and frontoparietal regions. No mass lesion, hemorrhage, hydrocephalus or extra-axial collection. There chronic small vessel changes in the pons and in the cerebral hemispheric white matter. There is an old right parietal cortical infarction at the vertex. No pituitary mass. No inflammatory sinus disease. No skull or skullbase lesion. Flow void is present in the major vessels at the base of the brain.  IMPRESSION: Background pattern of chronic small vessel disease. Areas of acute infarction in the right middle cerebral artery territory consistent with embolic disease. The largest area is a 2.5 cm region in the posterior temporal lobe. No hemorrhage or mass effect.   Electronically Signed   By: Paulina Fusi M.D.   On: 08/04/2013 12:40   US Carotid Duplex Bilateral  08/04/2013   *RADIOLOGY REPORT*  Clinical Data: Acute right MCA territory small infarctions compatible with embolic event.  BILATERAL CAROTID DUPLEX ULTRASOUND  Technique: Wallace Cullens scale imaging, color Doppler and duplex ultrasound were performed of bilateral carotid and vertebral arteries in the neck.  Comparison:  08/04/2013 brain MRI  Criteria:  Quantification of carotid stenosis is based on velocity parameters that correlate the residual internal carotid diameter with NASCET-based stenosis levels, using the diameter of the distal internal carotid lumen as the denominator for stenosis measurement.  The following velocity measurements were obtained:                   PEAK SYSTOLIC/END DIASTOLIC RIGHT ICA:                        107/19cm/sec CCA:                        63/11cm/sec SYSTOLIC ICA/CCA  RATIO:     1.7 DIASTOLIC ICA/CCA RATIO:    1.78 ECA:                        177cm/sec  LEFT ICA:                        79/12cm/sec CCA:                         62/10cm/sec SYSTOLIC ICA/CCA RATIO:     1.27 DIASTOLIC ICA/CCA RATIO:    1.19 ECA:                        152cm/sec  Findings:  RIGHT CAROTID ARTERY: Mild bifurcation heterogeneous plaque formation with echogenic shadowing.  No hemodynamically significant right ICA stenosis, velocity elevation, or turbulent flow.  Degree of narrowing less than 50%.  RIGHT VERTEBRAL ARTERY:  Antegrade  LEFT CAROTID ARTERY: Similar minor plaque formation in the bifurcation.  No hemodynamically significant left ICA stenosis, velocity elevation, or turbulent flow. Degree of narrowing also less than 50%.  LEFT VERTEBRAL ARTERY:  Antegrade  IMPRESSION: Minor carotid atherosclerosis.  No hemodynamically significant ICA stenosis by ultrasound.  Degree of narrowing less than 50% bilaterally.   Original Report Authenticated By: Judie Petit. Miles Costain, M.D.   Dg Chest Port 1 View  08/04/2013   *RADIOLOGY REPORT*  Clinical Data: PICC line placement  PORTABLE CHEST - 1 VIEW  Comparison: 08/03/2013  Findings: Right-sided PICC line terminates 8.5 cm below the carina, over the right atrium.  No pneumothorax.  Evidence of CABG re- identified mild cardiomegaly.  Right humeral deformity re- identified.  The lungs are clear.  IMPRESSION: Right-sided PICC line tip over right atrium.  This could be pulled back 3.5 cm if cavoatrial positioning is desired.   Original Report Authenticated By: Christiana Pellant, M.D.   Dg Fluoro Guide Lumbar Puncture  08/04/2013   *RADIOLOGY REPORT*  Clinical Data:Altered mental status and fever.  Unsuccessful lumbar puncture, a performance under fluoroscopy was requested.  LUMBAR PUNCTURE FLUORO GUIDE  Fluoroscopy Time: 54 seconds  Comparison: No similar prior study is available for comparison.  Findings: Despite multiple attempts, lumbar puncture could not be successfully performed.  Standard sterile technique and local 1% lidocaine anesthesia was utilized.  IMPRESSION: Unsuccessful attempted lumbar puncture.   Original  Report Authenticated By: Christiana Pellant, M.D.    Scheduled Meds: . antiseptic oral rinse  15 mL Mouth Rinse QID  . aspirin  300 mg Rectal Daily  . chlorhexidine  15 mL Mouth/Throat BID  . meropenem (MERREM) IV  2 g Intravenous Q12H  . sodium chloride  10-40 mL Intracatheter Q12H  . sodium chloride  3 mL Intravenous Q12H  . thiamine  100 mg Intravenous Daily  . vancomycin  1,000 mg Intravenous Q12H   Continuous Infusions: . dextrose 50 mL/hr at 08/04/13 1524  . dextrose        Time spent: 35 minutes    Philana Younis  [facility] Pager [phone] If 7PM-7AM, please contact night-coverage at [email or web address], password [facility] 08/04/2013, 4:51 PM  LOS: 1 day

## 2013-08-04 NOTE — Progress Notes (Signed)
Hypoglycemic Event  CBG: 68  Treatment: 1 tube glucose gel  Symptoms: none  Follow-up  Result: 81mg /dl  Possible Reasons for Event: NPO  Comments/MD notified:Dr Dhungel D50 changed to 100cc/hr.    Alicia Mason  Remember to initiate Hypoglycemia Order Set & complete

## 2013-08-04 NOTE — Progress Notes (Signed)
ANTIBIOTIC CONSULT NOTE - INITIAL  Pharmacy Consult for Vancomycin and Meropenem Indication: FUO  Allergies  Allergen Reactions  . Cephalexin     NOT SURE OF THE REACTION, BUT WAS TOLD NOT TO TAKE IT. Granddaughter mentioned that patient had hives, rash. Denies anaphylactic reaction.   Patient Measurements: Height: 5\' 3"  (160 cm) Weight: 147 lb 11.3 oz (67 kg) IBW/kg (Calculated) : 52.4  Vital Signs: Temp: 99.9 F (37.7 C) (09/15 0739) Temp src: Core (Comment) (09/15 0739) BP: 99/42 mmHg (09/15 0600) Pulse Rate: 76 (09/15 0600) Intake/Output from previous day: 09/14 0701 - 09/15 0700 In: 1021.7 [I.V.:521.7; IV Piggyback:500] Out: 450 [Urine:450] Intake/Output from this shift:    Labs:  Recent Labs  08/03/13 1752 08/04/13 0437  WBC 14.1* 12.0*  HGB 13.3 11.3*  PLT 242 193  CREATININE 0.70 0.68   Estimated Creatinine Clearance: 43.8 ml/min (by C-G formula based on Cr of 0.68). No results found for this basename: Rolm Gala, VANCORANDOM, GENTTROUGH, GENTPEAK, GENTRANDOM, TOBRATROUGH, TOBRAPEAK, TOBRARND, AMIKACINPEAK, AMIKACINTROU, AMIKACIN,  in the last 72 hours   Microbiology: Recent Results (from the past 720 hour(s))  URINE CULTURE     Status: None   Collection Time    07/29/13  9:16 PM      Result Value Range Status   Specimen Description URINE, CLEAN CATCH   Final   Special Requests NONE   Final   Culture  Setup Time     Final   Value: 07/29/2013 22:30     Performed at [facility] Count     Final   Value: >=100,000 COLONIES/ML     Performed at [facility]   Culture     Final   Value: KLEBSIELLA PNEUMONIAE     Note: Previous Result = 60 000 COLONIES/ML CORRECTED RESULTS CALLED TO: CINDY MORRIS ON 07/31/2013 AT 9:32AM HAJAM     Performed at [facility]   Report Status 07/31/2013 FINAL   Final   Organism ID, Bacteria KLEBSIELLA PNEUMONIAE   Final  MRSA PCR SCREENING     Status: None   Collection Time   08/03/13  9:20 PM      Result Value Range Status   MRSA by PCR NEGATIVE  NEGATIVE Final   Comment:            The GeneXpert MRSA Assay (FDA     approved for NASAL specimens     only), is one component of a     comprehensive MRSA colonization     surveillance program. It is not     intended to diagnose MRSA     infection nor to guide or     monitor treatment for     MRSA infections.   Medical History: Past Medical History  Diagnosis Date  . Essential hypertension, benign   . Type 2 diabetes mellitus   . Coronary atherosclerosis of native coronary artery     Multivessel  . Closed fracture of right humerus     Managed conservatively 2011  . Cerebrovascular disease   . Seasonal allergies   . Arthritis   . Vertigo   . Ischemic cardiomyopathy     EF 40% per Echo 06/2011.  Marland Kitchen Near syncope   . NSTEMI (non-ST elevated myocardial infarction)     06/2011.  . Diabetes mellitus    Medications:  reviewed Assessment: 79yo female admitted with fever after being found unresponsive.  Blood sugar was reportedly 40.  SCr is ok for  age & at baseline.  Fevers have improved.    Estimated Creatinine Clearance: 43.8 ml/min (by C-G formula based on Cr of 0.68).  Goal of Therapy:  Trough level 15-20  Plan:  Vancomycin 1500mg  x 1 then 1000mg  IV q12h Check trough at steady state Meropenem 2000mg  IV q12h (renally adjusted) Monitor labs, renal fxn, and cultures  Valrie Hart A 08/04/2013,8:18 AM

## 2013-08-04 NOTE — Progress Notes (Signed)
INITIAL NUTRITION ASSESSMENT  DOCUMENTATION CODES Per approved criteria  -Not Applicable   INTERVENTION: Follow up diet advancment  NUTRITION DIAGNOSIS: Inadequate oral itnake related to acute illness as evidenced by UTI, acute encephalopathy, NPO status.   Goal: Pt to meet >/= 90% of their estimated nutrition needs   Monitor:  Po intake, labs and wt trends  Reason for Assessment: Malnutrition Screen Score = 2  79 y.o. female  ASSESSMENT:Pt presented to ED unresponsive, hypoglycemia and febrile. She is out of her room for lumbar puncture. Currently NPO. Will follow up as diet is advanced to further assess nutrition status/adequacey of intake.  Patient Active Problem List   Diagnosis Date Noted  . Fever 08/03/2013  . Acute encephalopathy 08/03/2013  . Hypoglycemia 08/03/2013  . UTI (lower urinary tract infection) 07/30/2013  . Confusion 07/30/2013  . Ataxia 07/30/2013  . Ischemic cardiomyopathy 07/13/2011  . Bradycardia 07/12/2011  . Near syncope 07/11/2011  . NSTEMI (non-ST elevated myocardial infarction) 07/11/2011  . CAD (coronary artery disease) 07/11/2011  . Hx of CABG 07/11/2011  . DM type 2 (diabetes mellitus, type 2) 07/11/2011  . HTN (hypertension), malignant 07/11/2011  . Cerebrovascular disease 07/11/2011  . Vertigo 07/11/2011  . FRACTURE, HUMERUS, PROXIMAL 11/24/2009  . IDDM 05/28/2009  . HYPERTENSION 05/28/2009  . CAD 05/28/2009  . ATRIAL FIBRILLATION 05/28/2009  . GERD 05/28/2009  . OSTEOARTHRITIS 05/28/2009    Height: Ht Readings from Last 1 Encounters:  08/03/13 5\' 3"  (1.6 m)    Weight: Wt Readings from Last 1 Encounters:  08/03/13 147 lb 11.3 oz (67 kg)    Ideal Body Weight: 115# (52.2 kg)  % Ideal Body Weight: 129%  Wt Readings from Last 10 Encounters:  08/03/13 147 lb 11.3 oz (67 kg)  07/31/13 143 lb 9.6 oz (65.137 kg)  07/31/11 157 lb (71.215 kg)  07/12/11 152 lb 1.9 oz (69 kg)    Usual Body Weight: 144-148#   % Usual Body  Weight: 100%  BMI:  Body mass index is 26.17 kg/(m^2).overweight  Estimated Nutritional Needs: Kcal: 9604-5409 Protein: 67-74 gr Fluid: 2000 ml/day  Skin: skin tears to right hand and left upper arm  Diet Order: NPO  EDUCATION NEEDS: -Education not appropriate at this time   Intake/Output Summary (Last 24 hours) at 08/04/13 1132 Last data filed at 08/04/13 1000  Gross per 24 hour  Intake 1021.67 ml  Output    675 ml  Net 346.67 ml    Last BM: PTA  Labs:   Recent Labs Lab 07/30/13 0528 08/03/13 1752 08/04/13 0437  NA 143 135 137  K 3.6 3.6 3.1*  CL 105 101 103  CO2 31 22 25   BUN 15 21 20   CREATININE 0.78 0.70 0.68  CALCIUM 9.4 9.1 8.6  GLUCOSE 44* 92 108*    CBG (last 3)   Recent Labs  08/04/13 0738 08/04/13 0843 08/04/13 1023  GLUCAP 112* 97 83    Scheduled Meds: . antiseptic oral rinse  15 mL Mouth Rinse QID  . aspirin  300 mg Rectal Daily  . chlorhexidine  15 mL Mouth/Throat BID  . meropenem (MERREM) IV  2 g Intravenous Q12H  . sodium chloride  3 mL Intravenous Q12H  . thiamine  100 mg Intravenous Daily  . vancomycin  1,000 mg Intravenous Q12H    Continuous Infusions: . dextrose 50 mL/hr at 08/04/13 0016    Past Medical History  Diagnosis Date  . Essential hypertension, benign   . Type 2 diabetes mellitus   .  Coronary atherosclerosis of native coronary artery     Multivessel  . Closed fracture of right humerus     Managed conservatively 2011  . Cerebrovascular disease   . Seasonal allergies   . Arthritis   . Vertigo   . Ischemic cardiomyopathy     EF 40% per Echo 06/2011.  Marland Kitchen Near syncope   . NSTEMI (non-ST elevated myocardial infarction)     06/2011.  . Diabetes mellitus     Past Surgical History  Procedure Laterality Date  . Coronary artery bypass graft  1999    LIMA to LAD, SVG to OM1 and OM2, SVG to PDA  . Cataract surgery    . Abdominal hysterectomy      WITH BSO.    Royann Shivers MS,RD,LDN,CSG Office:  [phone] Pager: [phone]

## 2013-08-05 DIAGNOSIS — N390 Urinary tract infection, site not specified: Secondary | ICD-10-CM

## 2013-08-05 DIAGNOSIS — I359 Nonrheumatic aortic valve disorder, unspecified: Secondary | ICD-10-CM

## 2013-08-05 LAB — BASIC METABOLIC PANEL
Calcium: 8.3 mg/dL — ABNORMAL LOW (ref 8.4–10.5)
Creatinine, Ser: 0.66 mg/dL (ref 0.50–1.10)
GFR calc Af Amer: 88 mL/min — ABNORMAL LOW (ref >90)

## 2013-08-05 LAB — GLUCOSE, CAPILLARY
Glucose-Capillary: 191 mg/dL — ABNORMAL HIGH (ref 70–99)
Glucose-Capillary: 209 mg/dL — ABNORMAL HIGH (ref 70–99)
Glucose-Capillary: 233 mg/dL — ABNORMAL HIGH (ref 70–99)
Glucose-Capillary: 241 mg/dL — ABNORMAL HIGH (ref 70–99)

## 2013-08-05 LAB — CBC
MCV: 94.7 fL (ref 78.0–100.0)
Platelets: 150 10*3/uL (ref 150–400)
RDW: 12.8 % (ref 11.5–15.5)
WBC: 8.6 10*3/uL (ref 4.0–10.5)

## 2013-08-05 MED ORDER — INSULIN GLARGINE 100 UNIT/ML ~~LOC~~ SOLN
10.0000 [IU] | Freq: Every day | SUBCUTANEOUS | Status: DC
  Administered 2013-08-05: 10 [IU] via SUBCUTANEOUS
  Filled 2013-08-05: qty 0.1

## 2013-08-05 MED ORDER — RIVAROXABAN 15 MG PO TABS
15.0000 mg | ORAL_TABLET | Freq: Every day | ORAL | Status: DC
  Administered 2013-08-06 – 2013-08-07 (×2): 15 mg via ORAL
  Filled 2013-08-05 (×3): qty 1

## 2013-08-05 MED ORDER — MAGNESIUM SULFATE 40 MG/ML IJ SOLN
2.0000 g | Freq: Once | INTRAMUSCULAR | Status: AC
  Administered 2013-08-05: 2 g via INTRAVENOUS
  Filled 2013-08-05: qty 50

## 2013-08-05 MED ORDER — INSULIN ASPART 100 UNIT/ML ~~LOC~~ SOLN
0.0000 [IU] | Freq: Three times a day (TID) | SUBCUTANEOUS | Status: DC
  Administered 2013-08-05: 2 [IU] via SUBCUTANEOUS
  Administered 2013-08-05 – 2013-08-06 (×3): 5 [IU] via SUBCUTANEOUS
  Administered 2013-08-06: 2 [IU] via SUBCUTANEOUS

## 2013-08-05 MED ORDER — POTASSIUM CHLORIDE 10 MEQ/100ML IV SOLN
10.0000 meq | INTRAVENOUS | Status: AC
  Administered 2013-08-05 (×4): 10 meq via INTRAVENOUS
  Filled 2013-08-05 (×4): qty 100

## 2013-08-05 MED ORDER — RIVAROXABAN 20 MG PO TABS: 20.0000 mg | ORAL_TABLET | Freq: Every day | ORAL | Status: DC

## 2013-08-05 NOTE — Consult Note (Signed)
Alicia Mason, Alicia Mason               ACCOUNT NO.:  [id number]  MEDICAL RECORD NO.:  [id number]  LOCATION:  IC09                          FACILITY:  [facility]  PHYSICIAN:  Joette Schmoker A. Alicia Mason, M.D. DATE OF BIRTH:  1922/07/07  DATE OF CONSULTATION: DATE OF DISCHARGE:                                CONSULTATION   IMPRESSION: 1. Acute right middle cerebral artery infarct in the setting of what     appears to be paroxysmal atrial fibrillation.  Given the embolic     phenomenon and atrial fibrillation, the patient should be on     chronic anticoagulation.  So far, she has been placed on aspirin, a     typical workup is pending including echo and carotid duplex     Doppler.  DVT prophylaxis is suggested. 2. Resolving altered mental status with elevated fever.  The     cephalopathy is likely multifactorial, exactly why the patient has     fever is not clear at this time, it is a possibility she may have     had some trouble aspiration when she had her acute stroke on     yesterday.  She has cultures pending.  We will continue to follow     this.  Because she has had an old infarct, she is at risk of having     seizures and therefore, EEG will be obtained.  HISTORY OF PRESENT ILLNESS:  The patient is an 79 year old white female, who is typically highly functional, lives by herself and is rather independent.  She presented a few days ago with confusion and was found to have UTI and hypoglycemia.  The patient was sent home and was discovered by her family to be unresponsive.  They had tried to get in contact with her for several hours but could not.  She was taken to the emergency room where she was found to have again hypoglycemia in the 40s, clear left hemiparesis, and a fever of 103.  Again, she was treated for UTI a few days ago, but workup in the hospital did not reveal a source of the infection.  The patient did have an attempt at a spinal tap in the emergency room, but this was unsuccessful  after several attempts.  One was also repeated under fluoroscopic guidance but unsuccessful.  The patient has been placed in the ICU and placed on vancomycin and another broad-spectrum antibiotics.  She has also been hydrated.  She actually has improved overnight.  Family reports that she was quite weak on the left side, almost plegic but this morning she is moving the left side and she is more responsive and talking.  The patient does not report any headaches, chest pain, shortness of breath, or any other complaints at this time.  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  PHYSICAL EXAMINATION:  GENERAL:  Today, she is awake, she knows she is in the hospital at [facility].  She is not oriented to time; however, she does follow commands well and is generally lucid.  No  dysarthria is observed. HEENT:  Neck is supple.  No meningismus observed.  Head is normocephalic. ABDOMEN:  Soft. EXTREMITIES:  No significant edema. NEUROLOGIC:  Cranial nerve evaluation shows the pupils are irregularly shaped, right is 5 mm, left is 3, both are minimally reactive.  Visual fields shows that she seems to extinguish double simultaneous stimulation on the left side.  Extraocular movements are full.  There is no nystagmus.  Tongue is midline.  Uvula midline.  Shoulder shrugs normal.  Motor examination shows significant drift of left upper extremity, deltoid 4-, triceps 4, hand grip is actually 5, left hip flexion 3 to 4-, dorsiflexion 4, right hip flexion 4, and dorsiflexion 4+.  Right upper extremity is 5.  Bulk and tone are normal throughout.  Reflexes are preserved throughout.  Plantars are both downgoing.  Sensation is normal to light touch.  Coordination shows no dysmetria or tremors.     Librado Guandique A. Alicia Mason, M.D.     KAD/MEDQ  D:  08/05/2013  T:  08/05/2013  Job:  [id number]

## 2013-08-05 NOTE — Clinical Social Work Psychosocial (Signed)
    Clinical Social Work Department BRIEF PSYCHOSOCIAL ASSESSMENT 08/05/2013  Patient:  Alicia Mason, Alicia Mason     Account Number:  [id number]     Admit date:  08/03/2013  Clinical Social Worker:  Santa Genera, CLINICAL SOCIAL WORKER  Date/Time:  08/05/2013 12:00 N  Referred by:  CSW  Date Referred:  08/05/2013 Referred for  SNF Placement   Other Referral:   Interview type:  Patient Other interview type:   Also spoke w step daughter in law, Sharmon Revere    PSYCHOSOCIAL DATA Living Status:  ALONE Admitted from facility:   Level of care:   Primary support name:  Sharmon Revere Primary support relationship to patient:  CHILD, ADULT Degree of support available:   Has significant support from stepchildren and grandchildren    CURRENT CONCERNS Current Concerns  Post-Acute Placement   Other Concerns:    SOCIAL WORK ASSESSMENT / PLAN CSW met w patient at bedside in ICU, patient alert and oriented x4.  Patient was evaluted by PT and found in need of SNF rehab placement.  Explained process to patient, patient agreeable to considering SNF placement at discharge.    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  Prior to this summer, had maintained extensive garden and was driving. Recently has had activity limitations and MD advised her not to drive.  Patient says she has frequent visits from children and friends, seems to have good support system. Agreeable to considering SNF rehab.  Spoke w step daughter in law w patient's consent.  Says family would like patient placed at [facility]  CSW left message for [facility] admissions w this request.   Assessment/plan status:  Psychosocial Support/Ongoing Assessment of Needs Other assessment/ plan:   Information/referral to community resources:   SNF list    PATIENT'S/FAMILY'S RESPONSE TO PLAN OF CARE: Patient agreeable to SNF rehab, family requests [facility]   Santa Genera, LCSW Clinical Social Worker [phone])

## 2013-08-05 NOTE — Clinical Social Work Note (Signed)
Clinical Social Work Department CLINICAL SOCIAL WORK PLACEMENT NOTE 08/05/2013  Patient:  Alicia Mason, Alicia Mason  Account Number:  [id number] Admit date:  08/03/2013  Clinical Social Worker:  Santa Genera, CLINICAL SOCIAL WORKER  Date/time:  08/05/2013 12:30 PM  Clinical Social Work is seeking post-discharge placement for this patient at the following level of care:   SKILLED NURSING   (*CSW will update this form in Epic as items are completed)   08/05/2013  Patient/family provided with [facility] Department of Clinical Social Work's list of facilities offering this level of care within the geographic area requested by the patient (or if unable, by the patient's family).  08/05/2013  Patient/family informed of their freedom to choose among providers that offer the needed level of care, that participate in Medicare, Medicaid or managed care program needed by the patient, have an available bed and are willing to accept the patient.  08/05/2013  Patient/family informed of [facility]' ownership interest in [facility], as well as of the fact that they are under no obligation to receive care at this facility.  PASARR submitted to [employer] on 08/05/2013 PASARR number received from [employer] on 08/05/2013  FL2 transmitted to all facilities in geographic area requested by pt/family on  08/05/2013 FL2 transmitted to all facilities within larger geographic area on   Patient informed that his/her managed care company has contracts with or will negotiate with  certain facilities, including the following:     Patient/family informed of bed offers received:   Patient chooses bed at  Physician recommends and patient chooses bed at    Patient to be transferred to  on   Patient to be transferred to facility by   The following physician request were entered in Epic:   Additional Comments:  Santa Genera, LCSW Clinical Social Worker [phone])

## 2013-08-05 NOTE — Progress Notes (Signed)
*  PRELIMINARY RESULTS* Echocardiogram 2D Echocardiogram has been performed.  Alicia Mason 08/05/2013, 5:38 PM

## 2013-08-05 NOTE — Consult Note (Signed)
[facility] Numair Masden A. [name], MD     [email or web address]          Alicia Mason is an 79 y.o. female.   ASSESSMENT/PLAN: Please see the dictation that is transcribed.   Past Medical History  Diagnosis Date  . Essential hypertension, benign   . Type 2 diabetes mellitus   . Coronary atherosclerosis of native coronary artery     Multivessel  . Closed fracture of right humerus     Managed conservatively 2011  . Cerebrovascular disease   . Seasonal allergies   . Arthritis   . Vertigo   . Ischemic cardiomyopathy     EF 40% per Echo 06/2011.  Marland Kitchen Near syncope   . NSTEMI (non-ST elevated myocardial infarction)     06/2011.  . Diabetes mellitus     Past Surgical History  Procedure Laterality Date  . Coronary artery bypass graft  1999    LIMA to LAD, SVG to OM1 and OM2, SVG to PDA  . Cataract surgery    . Abdominal hysterectomy      WITH BSO.    Family History  Problem Relation Age of Onset  . CAD Sister     Died with MI age 51  . Pneumonia Mother   . Arthritis Other   . Coronary artery disease Other   . Diabetes Other   . Cancer Other     Social History:  reports that she has never smoked. She has never used smokeless tobacco. She reports that she does not drink alcohol or use illicit drugs.  Allergies:  Allergies  Allergen Reactions  . Cephalexin     NOT SURE OF THE REACTION, BUT WAS TOLD NOT TO TAKE IT. Granddaughter mentioned that patient had hives, rash. Denies anaphylactic reaction.    Medications: Prior to Admission medications   Medication Sig Start Date End Date Taking? Authorizing Provider  ALPRAZolam Prudy Feeler) 0.5 MG tablet Take 0.5 mg by mouth every 8 (eight) hours as needed for anxiety.  07/25/13  Yes Historical Provider, MD  ciprofloxacin (CIPRO) 500 MG tablet Take 0.5 tablets (250 mg total) by mouth 2 (two) times daily. 07/31/13  Yes Nishant Dhungel, MD  insulin glargine (LANTUS) 100 UNIT/ML injection Inject 0.25 mLs (25 Units total) into  the skin at bedtime. 07/31/13  Yes Nishant Dhungel, MD  metoprolol succinate (TOPROL-XL) 100 MG 24 hr tablet Take 100 mg by mouth daily. Take with or immediately following a meal.   Yes Historical Provider, MD  MYRBETRIQ 50 MG TB24 tablet Take 1 tablet by mouth daily. 07/03/13  Yes Historical Provider, MD  pravastatin (PRAVACHOL) 10 MG tablet Take 10 mg by mouth daily.     Yes Historical Provider, MD  quinapril (ACCUPRIL) 40 MG tablet Take 40 mg by mouth daily.     Yes Historical Provider, MD  meclizine (ANTIVERT) 12.5 MG tablet Take 12.5 mg by mouth daily as needed. Motion sickness     Historical Provider, MD     Scheduled Meds: . antiseptic oral rinse  15 mL Mouth Rinse QID  . aspirin  300 mg Rectal Daily  . chlorhexidine  15 mL Mouth/Throat BID  . meropenem (MERREM) IV  2 g Intravenous Q12H  . sodium chloride  10-40 mL Intracatheter Q12H  . sodium chloride  3 mL Intravenous Q12H  . thiamine  100 mg Intravenous Daily  . vancomycin  1,000 mg Intravenous Q12H   Continuous Infusions: . dextrose 50 mL/hr at 08/05/13 0337  . dextrose  PRN Meds:.acetaminophen, acetaminophen, dextrose, glucose-Vitamin C, ondansetron (ZOFRAN) IV, ondansetron, sodium chloride    Blood pressure 127/43, pulse 68, temperature 98.6 F (37 C), temperature source Oral, resp. rate 15, height 5\' 3"  (1.6 m), weight 67 kg (147 lb 11.3 oz), SpO2 97.00%.   Results for orders placed during the hospital encounter of 08/03/13 (from the past 48 hour(s))  GLUCOSE, CAPILLARY     Status: None   Collection Time    08/03/13  4:51 PM      Result Value Range   Glucose-Capillary 90  70 - 99 mg/dL  CBC WITH DIFFERENTIAL     Status: Abnormal   Collection Time    08/03/13  5:52 PM      Result Value Range   WBC 14.1 (*) 4.0 - 10.5 K/uL   RBC 4.09  3.87 - 5.11 MIL/uL   Hemoglobin 13.3  12.0 - 15.0 g/dL   HCT 09.8  11.9 - 14.7 %   MCV 91.4  78.0 - 100.0 fL   MCH 32.5  26.0 - 34.0 pg   MCHC 35.6  30.0 - 36.0 g/dL   RDW  82.9  56.2 - 13.0 %   Platelets 242  150 - 400 K/uL   Neutrophils Relative % 88 (*) 43 - 77 %   Lymphocytes Relative 10 (*) 12 - 46 %   Monocytes Relative 2 (*) 3 - 12 %   Eosinophils Relative 0  0 - 5 %   Basophils Relative 0  0 - 1 %   Band Neutrophils 0  0 - 10 %   Metamyelocytes Relative 0     Myelocytes 0     Promyelocytes Absolute 0     Blasts 0     nRBC 0  0 /100 WBC   Neutro Abs 12.4 (*) 1.7 - 7.7 K/uL   Lymphs Abs 1.4  0.7 - 4.0 K/uL   Monocytes Absolute 0.3  0.1 - 1.0 K/uL   Eosinophils Absolute 0.0  0.0 - 0.7 K/uL   Basophils Absolute 0.0  0.0 - 0.1 K/uL  BASIC METABOLIC PANEL     Status: Abnormal   Collection Time    08/03/13  5:52 PM      Result Value Range   Sodium 135  135 - 145 mEq/L   Potassium 3.6  3.5 - 5.1 mEq/L   Chloride 101  96 - 112 mEq/L   CO2 22  19 - 32 mEq/L   Glucose, Bld 92  70 - 99 mg/dL   BUN 21  6 - 23 mg/dL   Creatinine, Ser 8.65  0.50 - 1.10 mg/dL   Calcium 9.1  8.4 - 78.4 mg/dL   GFR calc non Af Amer 75 (*) >90 mL/min   GFR calc Af Amer 86 (*) >90 mL/min   Comment: (NOTE)     The eGFR has been calculated using the CKD EPI equation.     This calculation has not been validated in all clinical situations.     eGFR's persistently <90 mL/min signify possible Chronic Kidney     Disease.  CULTURE, BLOOD (SINGLE)     Status: None   Collection Time    08/03/13  5:52 PM      Result Value Range   Specimen Description ARTERIAL BLOOD FEMORAL     Special Requests BOTTLES DRAWN AEROBIC AND ANAEROBIC 8CC EACH     Culture NO GROWTH 1 DAY     Report Status PENDING    LACTIC ACID, PLASMA  Status: None   Collection Time    08/03/13  5:52 PM      Result Value Range   Lactic Acid, Venous 1.9  0.5 - 2.2 mmol/L  URINALYSIS, ROUTINE W REFLEX MICROSCOPIC     Status: Abnormal   Collection Time    08/03/13  6:00 PM      Result Value Range   Color, Urine YELLOW  YELLOW   APPearance CLEAR  CLEAR   Specific Gravity, Urine 1.020  1.005 - 1.030   pH 6.0   5.0 - 8.0   Glucose, UA 250 (*) NEGATIVE mg/dL   Hgb urine dipstick TRACE (*) NEGATIVE   Bilirubin Urine NEGATIVE  NEGATIVE   Ketones, ur NEGATIVE  NEGATIVE mg/dL   Protein, ur NEGATIVE  NEGATIVE mg/dL   Urobilinogen, UA 0.2  0.0 - 1.0 mg/dL   Nitrite NEGATIVE  NEGATIVE   Leukocytes, UA NEGATIVE  NEGATIVE  URINE CULTURE     Status: None   Collection Time    08/03/13  6:00 PM      Result Value Range   Specimen Description URINE, CATHETERIZED     Special Requests NONE     Culture  Setup Time       Value: 08/03/2013 22:00     Performed at [facility] Count       Value: NO GROWTH     Performed at [facility]   Culture       Value: NO GROWTH     Performed at [facility]   Report Status 08/04/2013 FINAL    URINE MICROSCOPIC-ADD ON     Status: None   Collection Time    08/03/13  6:00 PM      Result Value Range   Squamous Epithelial / LPF RARE  RARE   WBC, UA 0-2  <3 WBC/hpf   RBC / HPF 0-2  <3 RBC/hpf  MRSA PCR SCREENING     Status: None   Collection Time    08/03/13  9:20 PM      Result Value Range   MRSA by PCR NEGATIVE  NEGATIVE   Comment:            The GeneXpert MRSA Assay (FDA     approved for NASAL specimens     only), is one component of a     comprehensive MRSA colonization     surveillance program. It is not     intended to diagnose MRSA     infection nor to guide or     monitor treatment for     MRSA infections.  BLOOD GAS, ARTERIAL     Status: Abnormal   Collection Time    08/03/13  9:35 PM      Result Value Range   O2 Content 1.5     Delivery systems NASAL CANNULA     pH, Arterial 7.509 (*) 7.350 - 7.450   pCO2 arterial 29.8 (*) 35.0 - 45.0 mmHg   pO2, Arterial 87.2  80.0 - 100.0 mmHg   Bicarbonate 23.5  20.0 - 24.0 mEq/L   TCO2 20.7  0 - 100 mmol/L   Acid-Base Excess 0.7  0.0 - 2.0 mmol/L   O2 Saturation 97.7     Patient temperature 37.0     Collection site RIGHT RADIAL     Drawn by [id number]     Sample type  ARTERIAL     Allens test (pass/fail) PASS  PASS  GLUCOSE, CAPILLARY  Status: Abnormal   Collection Time    08/03/13 10:00 PM      Result Value Range   Glucose-Capillary 25 (*) 70 - 99 mg/dL   Comment 1 Documented in Chart     Comment 2 Notify RN    TROPONIN I     Status: None   Collection Time    08/03/13 10:22 PM      Result Value Range   Troponin I <0.30  <0.30 ng/mL   Comment:            Due to the release kinetics of cTnI,     a negative result within the first hours     of the onset of symptoms does not rule out     myocardial infarction with certainty.     If myocardial infarction is still suspected,     repeat the test at appropriate intervals.  URINE RAPID DRUG SCREEN (HOSP PERFORMED)     Status: Abnormal   Collection Time    08/03/13 10:23 PM      Result Value Range   Opiates NONE DETECTED  NONE DETECTED   Cocaine NONE DETECTED  NONE DETECTED   Benzodiazepines POSITIVE (*) NONE DETECTED   Amphetamines NONE DETECTED  NONE DETECTED   Tetrahydrocannabinol NONE DETECTED  NONE DETECTED   Barbiturates NONE DETECTED  NONE DETECTED   Comment:            DRUG SCREEN FOR MEDICAL PURPOSES     ONLY.  IF CONFIRMATION IS NEEDED     FOR ANY PURPOSE, NOTIFY LAB     WITHIN 5 DAYS.                LOWEST DETECTABLE LIMITS     FOR URINE DRUG SCREEN     Drug Class       Cutoff (ng/mL)     Amphetamine      1000     Barbiturate      200     Benzodiazepine   200     Tricyclics       300     Opiates          300     Cocaine          300     THC              50  GLUCOSE, CAPILLARY     Status: None   Collection Time    08/03/13 10:25 PM      Result Value Range   Glucose-Capillary 85  70 - 99 mg/dL   Comment 1 Documented in Chart     Comment 2 Notify RN    GLUCOSE, CAPILLARY     Status: Abnormal   Collection Time    08/03/13 11:48 PM      Result Value Range   Glucose-Capillary 56 (*) 70 - 99 mg/dL   Comment 1 Documented in Chart     Comment 2 Notify RN    GLUCOSE,  CAPILLARY     Status: Abnormal   Collection Time    08/04/13 12:19 AM      Result Value Range   Glucose-Capillary 111 (*) 70 - 99 mg/dL   Comment 1 Documented in Chart     Comment 2 Notify RN    GLUCOSE, CAPILLARY     Status: None   Collection Time    08/04/13  1:05 AM      Result Value Range   Glucose-Capillary 88  70 - 99 mg/dL   Comment 1 Documented in Chart     Comment 2 Notify RN    GLUCOSE, CAPILLARY     Status: None   Collection Time    08/04/13  1:58 AM      Result Value Range   Glucose-Capillary 75  70 - 99 mg/dL   Comment 1 Documented in Chart     Comment 2 Notify RN    GLUCOSE, CAPILLARY     Status: Abnormal   Collection Time    08/04/13  3:02 AM      Result Value Range   Glucose-Capillary 64 (*) 70 - 99 mg/dL   Comment 1 Documented in Chart     Comment 2 Notify RN    GLUCOSE, CAPILLARY     Status: Abnormal   Collection Time    08/04/13  4:01 AM      Result Value Range   Glucose-Capillary 109 (*) 70 - 99 mg/dL   Comment 1 Documented in Chart     Comment 2 Notify RN    TSH     Status: Abnormal   Collection Time    08/04/13  4:37 AM      Result Value Range   TSH 0.276 (*) 0.350 - 4.500 uIU/mL   Comment: Performed at [facility]  COMPREHENSIVE METABOLIC PANEL     Status: Abnormal   Collection Time    08/04/13  4:37 AM      Result Value Range   Sodium 137  135 - 145 mEq/L   Potassium 3.1 (*) 3.5 - 5.1 mEq/L   Chloride 103  96 - 112 mEq/L   CO2 25  19 - 32 mEq/L   Glucose, Bld 108 (*) 70 - 99 mg/dL   BUN 20  6 - 23 mg/dL   Creatinine, Ser 1.61  0.50 - 1.10 mg/dL   Calcium 8.6  8.4 - 09.6 mg/dL   Total Protein 5.6 (*) 6.0 - 8.3 g/dL   Albumin 2.8 (*) 3.5 - 5.2 g/dL   AST 52 (*) 0 - 37 U/L   ALT 25  0 - 35 U/L   Alkaline Phosphatase 67  39 - 117 U/L   Total Bilirubin 0.6  0.3 - 1.2 mg/dL   GFR calc non Af Amer 75 (*) >90 mL/min   GFR calc Af Amer 87 (*) >90 mL/min   Comment: (NOTE)     The eGFR has been calculated using the CKD EPI equation.       This calculation has not been validated in all clinical situations.     eGFR's persistently <90 mL/min signify possible Chronic Kidney     Disease.  CBC     Status: Abnormal   Collection Time    08/04/13  4:37 AM      Result Value Range   WBC 12.0 (*) 4.0 - 10.5 K/uL   RBC 3.51 (*) 3.87 - 5.11 MIL/uL   Hemoglobin 11.3 (*) 12.0 - 15.0 g/dL   HCT 04.5 (*) 40.9 - 81.1 %   MCV 93.2  78.0 - 100.0 fL   MCH 32.2  26.0 - 34.0 pg   MCHC 34.6  30.0 - 36.0 g/dL   RDW 91.4  78.2 - 95.6 %   Platelets 193  150 - 400 K/uL  PROTIME-INR     Status: None   Collection Time    08/04/13  4:37 AM      Result Value Range   Prothrombin Time 14.5  11.6 - 15.2 seconds   INR 1.15  0.00 - 1.49  GLUCOSE, CAPILLARY     Status: None   Collection Time    08/04/13  5:46 AM      Result Value Range   Glucose-Capillary 81  70 - 99 mg/dL   Comment 1 Documented in Chart     Comment 2 Notify RN    GLUCOSE, CAPILLARY     Status: Abnormal   Collection Time    08/04/13  6:49 AM      Result Value Range   Glucose-Capillary 63 (*) 70 - 99 mg/dL   Comment 1 Documented in Chart     Comment 2 Notify RN    GLUCOSE, CAPILLARY     Status: Abnormal   Collection Time    08/04/13  7:38 AM      Result Value Range   Glucose-Capillary 112 (*) 70 - 99 mg/dL   Comment 1 Documented in Chart     Comment 2 Notify RN    GLUCOSE, CAPILLARY     Status: None   Collection Time    08/04/13  8:43 AM      Result Value Range   Glucose-Capillary 97  70 - 99 mg/dL   Comment 1 Documented in Chart     Comment 2 Notify RN    GLUCOSE, CAPILLARY     Status: None   Collection Time    08/04/13 10:23 AM      Result Value Range   Glucose-Capillary 83  70 - 99 mg/dL  GLUCOSE, CAPILLARY     Status: Abnormal   Collection Time    08/04/13  1:15 PM      Result Value Range   Glucose-Capillary 62 (*) 70 - 99 mg/dL   Comment 1 Notify RN    GLUCOSE, CAPILLARY     Status: None   Collection Time    08/04/13  2:57 PM      Result Value Range    Glucose-Capillary 70  70 - 99 mg/dL   Comment 1 Notify RN    GLUCOSE, CAPILLARY     Status: Abnormal   Collection Time    08/04/13  4:27 PM      Result Value Range   Glucose-Capillary 68 (*) 70 - 99 mg/dL   Comment 1 Notify RN    GLUCOSE, CAPILLARY     Status: None   Collection Time    08/04/13  4:56 PM      Result Value Range   Glucose-Capillary 81  70 - 99 mg/dL  GLUCOSE, CAPILLARY     Status: Abnormal   Collection Time    08/04/13  7:51 PM      Result Value Range   Glucose-Capillary 118 (*) 70 - 99 mg/dL   Comment 1 Documented in Chart     Comment 2 Notify RN    GLUCOSE, CAPILLARY     Status: Abnormal   Collection Time    08/04/13  9:17 PM      Result Value Range   Glucose-Capillary 160 (*) 70 - 99 mg/dL   Comment 1 Documented in Chart     Comment 2 Notify RN    GLUCOSE, CAPILLARY     Status: Abnormal   Collection Time    08/04/13 10:19 PM      Result Value Range   Glucose-Capillary 189 (*) 70 - 99 mg/dL   Comment 1 Documented in Chart     Comment 2 Notify RN    BASIC  METABOLIC PANEL     Status: Abnormal   Collection Time    08/04/13 10:40 PM      Result Value Range   Sodium 134 (*) 135 - 145 mEq/L   Potassium 2.8 (*) 3.5 - 5.1 mEq/L   Chloride 100  96 - 112 mEq/L   CO2 27  19 - 32 mEq/L   Glucose, Bld 186 (*) 70 - 99 mg/dL   BUN 14  6 - 23 mg/dL   Creatinine, Ser 4.09  0.50 - 1.10 mg/dL   Calcium 8.2 (*) 8.4 - 10.5 mg/dL   GFR calc non Af Amer 76 (*) >90 mL/min   GFR calc Af Amer 89 (*) >90 mL/min   Comment: (NOTE)     The eGFR has been calculated using the CKD EPI equation.     This calculation has not been validated in all clinical situations.     eGFR's persistently <90 mL/min signify possible Chronic Kidney     Disease.  MAGNESIUM     Status: None   Collection Time    08/04/13 10:40 PM      Result Value Range   Magnesium 1.8  1.5 - 2.5 mg/dL  GLUCOSE, CAPILLARY     Status: Abnormal   Collection Time    08/04/13 10:57 PM      Result Value Range    Glucose-Capillary 187 (*) 70 - 99 mg/dL   Comment 1 Documented in Chart     Comment 2 Notify RN    GLUCOSE, CAPILLARY     Status: Abnormal   Collection Time    08/05/13 12:11 AM      Result Value Range   Glucose-Capillary 233 (*) 70 - 99 mg/dL   Comment 1 Documented in Chart     Comment 2 Notify RN    GLUCOSE, CAPILLARY     Status: Abnormal   Collection Time    08/05/13  1:09 AM      Result Value Range   Glucose-Capillary 241 (*) 70 - 99 mg/dL  GLUCOSE, CAPILLARY     Status: Abnormal   Collection Time    08/05/13  2:04 AM      Result Value Range   Glucose-Capillary 260 (*) 70 - 99 mg/dL  GLUCOSE, CAPILLARY     Status: Abnormal   Collection Time    08/05/13  2:56 AM      Result Value Range   Glucose-Capillary 252 (*) 70 - 99 mg/dL  GLUCOSE, CAPILLARY     Status: Abnormal   Collection Time    08/05/13  3:57 AM      Result Value Range   Glucose-Capillary 263 (*) 70 - 99 mg/dL   Comment 1 Documented in Chart     Comment 2 Notify RN    BASIC METABOLIC PANEL     Status: Abnormal   Collection Time    08/05/13  4:22 AM      Result Value Range   Sodium 132 (*) 135 - 145 mEq/L   Potassium 3.3 (*) 3.5 - 5.1 mEq/L   Chloride 100  96 - 112 mEq/L   CO2 26  19 - 32 mEq/L   Glucose, Bld 252 (*) 70 - 99 mg/dL   BUN 11  6 - 23 mg/dL   Creatinine, Ser 8.11  0.50 - 1.10 mg/dL   Calcium 8.3 (*) 8.4 - 10.5 mg/dL   GFR calc non Af Amer 76 (*) >90 mL/min   GFR calc Af Amer 88 (*) >90  mL/min   Comment: (NOTE)     The eGFR has been calculated using the CKD EPI equation.     This calculation has not been validated in all clinical situations.     eGFR's persistently <90 mL/min signify possible Chronic Kidney     Disease.  CBC     Status: Abnormal   Collection Time    08/05/13  4:22 AM      Result Value Range   WBC 8.6  4.0 - 10.5 K/uL   RBC 3.19 (*) 3.87 - 5.11 MIL/uL   Hemoglobin 10.3 (*) 12.0 - 15.0 g/dL   HCT 16.1 (*) 09.6 - 04.5 %   MCV 94.7  78.0 - 100.0 fL   MCH 32.3  26.0 -  34.0 pg   MCHC 34.1  30.0 - 36.0 g/dL   RDW 40.9  81.1 - 91.4 %   Platelets 150  150 - 400 K/uL  GLUCOSE, CAPILLARY     Status: Abnormal   Collection Time    08/05/13  5:02 AM      Result Value Range   Glucose-Capillary 233 (*) 70 - 99 mg/dL   Comment 1 Documented in Chart     Comment 2 Notify RN    GLUCOSE, CAPILLARY     Status: Abnormal   Collection Time    08/05/13  6:19 AM      Result Value Range   Glucose-Capillary 265 (*) 70 - 99 mg/dL   Comment 1 Documented in Chart     Comment 2 Notify RN    GLUCOSE, CAPILLARY     Status: Abnormal   Collection Time    08/05/13  6:54 AM      Result Value Range   Glucose-Capillary 231 (*) 70 - 99 mg/dL   Comment 1 Documented in Chart     Comment 2 Notify RN    GLUCOSE, CAPILLARY     Status: Abnormal   Collection Time    08/05/13  7:52 AM      Result Value Range   Glucose-Capillary 209 (*) 70 - 99 mg/dL    Dg Chest 1 View  [id number]/82/9562   CLINICAL DATA:  Altered mental status  EXAM: CHEST - 1 VIEW  COMPARISON:  None.  FINDINGS: The heart size and mediastinal contours are within normal limits. Status post CABG. No acute infiltrate or pulmonary edema. Bony thorax is stable.  IMPRESSION: No active disease.  Status post CABG again noted.   Electronically Signed   By: Natasha Mead   On: 08/03/2013 17:14   Ct Head Wo Contrast  08/03/2013   CLINICAL DATA:  Altered level of consciousness.  EXAM: CT HEAD WITHOUT CONTRAST  TECHNIQUE: Contiguous axial images were obtained from the base of the skull through the vertex without intravenous contrast.  COMPARISON:  07/29/2013.  FINDINGS: Age related cerebral atrophy, ventriculomegaly and periventricular white matter disease. There are also remote lacunar-type basal ganglia infarcts.  No extra-axial fluid collections or CT findings for hemispheric infarction an or intracranial hemorrhage.  There is a 2 cm left-sided posterior fossa mass which is slightly hyperdense and is most likely a benign meningioma. It has  enlarged since 2005. Given the patient's age this is probably inconsequential and does not require any further imaging evaluation.  The bony structures are intact. The paranasal sinuses and mastoid air cells are clear. The globes are intact.  IMPRESSION: Age related cerebral atrophy, ventriculomegaly and periventricular white matter disease.  No acute intracranial findings.  2 cm left-sided  posterior fossa mass adjacent to the left temporal bone is most likely a benign meningioma.   Electronically Signed   By: Loralie Champagne M.D.   On: 08/03/2013 17:19   Mr Brain Wo Contrast  08/04/2013   CLINICAL DATA:  Altered mental status. Vomiting.  EXAM: MRI HEAD WITHOUT CONTRAST  TECHNIQUE: Multiplanar, multisequence MR imaging was performed. No intravenous contrast was administered.  COMPARISON:  Head CT 08/03/2013  FINDINGS: Diffusion imaging shows scattered foci of restricted diffusion in the right middle cerebral artery territory consistent with embolic infarctions. The largest region is a 2.5 cm area in the posterior temporal lobe. The other areas are less than a cm in size and present in the right temporal and frontoparietal regions. No mass lesion, hemorrhage, hydrocephalus or extra-axial collection. There chronic small vessel changes in the pons and in the cerebral hemispheric white matter. There is an old right parietal cortical infarction at the vertex. No pituitary mass. No inflammatory sinus disease. No skull or skullbase lesion. Flow void is present in the major vessels at the base of the brain.  IMPRESSION: Background pattern of chronic small vessel disease. Areas of acute infarction in the right middle cerebral artery territory consistent with embolic disease. The largest area is a 2.5 cm region in the posterior temporal lobe. No hemorrhage or mass effect.   Electronically Signed   By: Paulina Fusi M.D.   On: 08/04/2013 12:40   US Carotid Duplex Bilateral  08/04/2013   *RADIOLOGY REPORT*  Clinical Data:  Acute right MCA territory small infarctions compatible with embolic event.  BILATERAL CAROTID DUPLEX ULTRASOUND  Technique: Wallace Cullens scale imaging, color Doppler and duplex ultrasound were performed of bilateral carotid and vertebral arteries in the neck.  Comparison:  08/04/2013 brain MRI  Criteria:  Quantification of carotid stenosis is based on velocity parameters that correlate the residual internal carotid diameter with NASCET-based stenosis levels, using the diameter of the distal internal carotid lumen as the denominator for stenosis measurement.  The following velocity measurements were obtained:                   PEAK SYSTOLIC/END DIASTOLIC RIGHT ICA:                        107/19cm/sec CCA:                        63/11cm/sec SYSTOLIC ICA/CCA RATIO:     1.7 DIASTOLIC ICA/CCA RATIO:    1.78 ECA:                        177cm/sec  LEFT ICA:                        79/12cm/sec CCA:                        62/10cm/sec SYSTOLIC ICA/CCA RATIO:     1.27 DIASTOLIC ICA/CCA RATIO:    1.19 ECA:                        152cm/sec  Findings:  RIGHT CAROTID ARTERY: Mild bifurcation heterogeneous plaque formation with echogenic shadowing.  No hemodynamically significant right ICA stenosis, velocity elevation, or turbulent flow.  Degree of narrowing less than 50%.  RIGHT VERTEBRAL ARTERY:  Antegrade  LEFT CAROTID ARTERY: Similar minor plaque formation in the  bifurcation.  No hemodynamically significant left ICA stenosis, velocity elevation, or turbulent flow. Degree of narrowing also less than 50%.  LEFT VERTEBRAL ARTERY:  Antegrade  IMPRESSION: Minor carotid atherosclerosis.  No hemodynamically significant ICA stenosis by ultrasound.  Degree of narrowing less than 50% bilaterally.   Original Report Authenticated By: Judie Petit. Miles Costain, M.D.   Dg Chest Port 1 View  08/04/2013   *RADIOLOGY REPORT*  Clinical Data: PICC line placement  PORTABLE CHEST - 1 VIEW  Comparison: 08/03/2013  Findings: Right-sided PICC line terminates 8.5 cm below  the carina, over the right atrium.  No pneumothorax.  Evidence of CABG re- identified mild cardiomegaly.  Right humeral deformity re- identified.  The lungs are clear.  IMPRESSION: Right-sided PICC line tip over right atrium.  This could be pulled back 3.5 cm if cavoatrial positioning is desired.   Original Report Authenticated By: Christiana Pellant, M.D.   Dg Fluoro Guide Lumbar Puncture  08/04/2013   *RADIOLOGY REPORT*  Clinical Data:Altered mental status and fever.  Unsuccessful lumbar puncture, a performance under fluoroscopy was requested.  LUMBAR PUNCTURE FLUORO GUIDE  Fluoroscopy Time: 54 seconds  Comparison: No similar prior study is available for comparison.  Findings: Despite multiple attempts, lumbar puncture could not be successfully performed.  Standard sterile technique and local 1% lidocaine anesthesia was utilized.  IMPRESSION: Unsuccessful attempted lumbar puncture.   Original Report Authenticated By: Christiana Pellant, M.D.        Payden Bonus A. [name], M.D.  Diplomate, Biomedical engineer of Psychiatry and Neurology ( Neurology). 08/05/2013, 9:13 AM

## 2013-08-05 NOTE — Evaluation (Signed)
Physical Therapy Evaluation Patient Details Name: Alicia Mason MRN: [id number] DOB: 06-May-1922 Today's Date: 08/05/2013 Time: 4098-1191 PT Time Calculation (min): 33 min  PT Assessment / Plan / Recommendation History of Present Illness  Pt is admitted with altered mental status, fever and acute ischemic stroke, initially demonstrating left hemiparesis.  She lives alone and is independent with all ADLs PTA.  She ambulates with a quad cane after a fall she sustained where she fractured her right humerus.  This has been healed for some time.  Clinical Impression   Pt is seen for evaluation.  She was actually evaluated last week by this Therapist and found to have no functional problems at that time.  She is now post stroke.  Pt is alert and reasonably oriented (knows that she is in the hospital, but not sure which one or where it is).  She is able to follow all directions and has no denial of the left side.  She had minimal decrease in strength of the LLE as compared to the right, but on brief eval of the LUE it appears to be slightly more involved.  I am recommending SNF at d/c.    PT Assessment  Patient needs continued PT services    Follow Up Recommendations  SNF    Does the patient have the potential to tolerate intense rehabilitation      Barriers to Discharge Decreased caregiver support      Equipment Recommendations  None recommended by PT    Recommendations for Other Services     Frequency Min 3X/week    Precautions / Restrictions Precautions Precautions: Fall Restrictions Weight Bearing Restrictions: No   Pertinent Vitals/Pain       Mobility  Bed Mobility Supine to Sit: 3: Mod assist;HOB elevated Sit to Supine: Not Tested (comment) Transfers Sit to Stand: 4: Min assist;From bed;With upper extremity assist Stand to Sit: 4: Min assist;To chair/3-in-1;Without upper extremity assist Ambulation/Gait Ambulation/Gait Assistance: 4: Min guard Ambulation Distance  (Feet): 3 Feet Assistive device: Rolling walker Gait Pattern: Within Functional Limits;Trunk flexed Gait velocity: WNL Stairs: No Wheelchair Mobility Wheelchair Mobility: No    Exercises     PT Diagnosis: Difficulty walking;Generalized weakness  PT Problem List: Decreased strength;Decreased activity tolerance;Decreased mobility PT Treatment Interventions: Gait training;Functional mobility training;Therapeutic exercise;Patient/family education     PT Goals(Current goals can be found in the care plan section) Acute Rehab PT Goals Patient Stated Goal: none stated PT Goal Formulation: With patient Time For Goal Achievement: 08/19/13 Potential to Achieve Goals: Good  Visit Information  Last PT Received On: 08/05/13 History of Present Illness: Pt is admitted with altered mental status, fever and acute ischemic stroke, initially demonstrating left hemiparesis.  She lives alone and is independent with all ADLs PTA.  She ambulates with a quad cane after a fall she sustained where she fractured her right humerus.  This has been healed for some time.       Prior Functioning  Home Living Family/patient expects to be discharged to:: Skilled nursing facility Prior Function Comments: uses a quad cane Communication Communication: No difficulties    Cognition       Extremity/Trunk Assessment Lower Extremity Assessment Lower Extremity Assessment: Overall WFL for tasks assessed;LLE deficits/detail;RLE deficits/detail RLE Sensation: history of peripheral neuropathy LLE Deficits / Details: LLE is 1/2 grade lower than R:E (4-/5) LLE Sensation: history of peripheral neuropathy   Balance Balance Balance Assessed: Yes Static Sitting Balance Static Sitting - Balance Support: No upper extremity supported;Feet supported Static  Sitting - Level of Assistance: 7: Independent  End of Session PT - End of Session Equipment Utilized During Treatment: Gait belt Activity Tolerance: Patient tolerated  treatment well Patient left: in chair;with call bell/phone within reach;with chair alarm set Nurse Communication: Mobility status  GP     Konrad Penta 08/05/2013, 12:38 PM

## 2013-08-05 NOTE — Progress Notes (Signed)
Dr. Rito Ehrlich is notified about pt's increased blood sugars, orders received and followed.

## 2013-08-05 NOTE — Progress Notes (Addendum)
Inpatient Diabetes Program Recommendations  AACE/ADA: New Consensus Statement on Inpatient Glycemic Control (2013)  Target Ranges:  Prepandial:   less than 140 mg/dL      Peak postprandial:   less than 180 mg/dL (1-2 hours)      Critically ill patients:  140 - 180 mg/dL  Results for BIANCE, MONCRIEF (MRN [id number]) as of 08/05/2013 11:48  Ref. Range 08/05/2013 00:11 08/05/2013 01:09 08/05/2013 02:04 08/05/2013 02:56 08/05/2013 03:57 08/05/2013 05:02 08/05/2013 06:19 08/05/2013 06:54 08/05/2013 07:52 08/05/2013 11:17  Glucose-Capillary Latest Range: 70-99 mg/dL 629 (H) 528 (H) 413 (H) 252 (H) 263 (H) 233 (H) 265 (H) 231 (H) 209 (H) 251 (H)    Inpatient Diabetes Program Recommendations Insulin - Basal: Please consider ordering low dose basal insulin; recommend starting with Lantus 5 or 10 units Q24H.  Note: Initially patient presented with low blood glucose and blood glucose was monitored Q1H.  According to the patient's chart, she was hospitalized from 9/9 to 9/11 and was discharged on Lantus 25 units daily (which was decreased from Lantus 35 units daily she was on prior to hospitalization).  Despite lower dose of Lantus that patient was discharged on, she became hypoglycemic and was brought back to the hospital on 9/14.  Over the past 12 hours, blood glucose has been consistently greater than 200 mg/dl.  Note that patient is ordered Novolog 0-9 units AC.  Please consider ordering low dose basal insulin; recommend starting with Lantus 5 or 10 units Q24H.  Will continue to follow.  Thanks, Orlando Penner, RN, MSN, CCRN Diabetes Coordinator Inpatient Diabetes Program [phone] (Team Pager) [phone] ([facility] office) [phone] [facility] office)

## 2013-08-05 NOTE — Progress Notes (Addendum)
[facility] PROGRESS NOTE  Alicia Mason ZOX:[id number] DOB: 29-Sep-1922 DOA: 08/03/2013 PCP: Cassell Smiles., MD  Brief Narrative  79 y/o female with hx of HTN, DM, CAD who was recently discharged from the hospital when she was admitted with intermittent confusion mild hypoglycemia and UTI. Her insulin dose was adjusted and discharged on ciprofloxacin. On the day of admission family was unable to reach her and called 911 as the door was locked. Patient was found sitting on the chair unresponsive with a blood glucose of 40. She was brought to the hospital found to have a fever of 103F in the ED. She was arousable to commands but clearly had left-sided weakness . An LP was attempted in the ED which was unsuccessful. She was then admitted to ICU with empiric antibiotics. Overnight she was repeatedly hypoglycemic requiring D10 drip . A fluoroscopy guided LP was attempted on 9/15 but was unsuccessful . An MRI of the brain done showed an acute right middle cerebral artery infarct .   Assessment/Plan:  Acute middle cerebral artery stroke  Patient has residual left hemiparesis with acute ischemic infarct over middle cerebral artery territory. Patient also noted to have new-onset A. fib on telemetry. Now clinically much improved in terms of the don't her normal mental status and improving left-sided weakness. -Seen by swallow eval nurse and placed on dysphagia level III diet. -2D echo pending. Carotid Doppler shows less than 50% stenosis bilaterally. Given the onset A. fib and chads score of 4 and started her on xarelto 15 mg daily.( renally dosed) -Discussed with neurology and plan on getting EEG given likely several hours of AMS.  Hypoglycemia  Patient insulin dose was reduced upon discharge recently . Patient had marked hypoglycemia on presentation and continueD to have drops in her blood glucose . Blood glucose have been above 200 since overnight. I have started her on sensitive sliding scale  insulin and I will place her on low-dose Lantus 10 units at bedtime.  Fever  Source unexplained. On ciprofloxacin at home for recent UTI. Continue empiric vancomycin and meropenem. LP unsuccessful on repeated attempts.  if remains afebrile he can discontinue antibiotics tomorrow. Blood Culture so far negative. Urine culture from last admission polydipsia which was sensitive to ciprofloxacin  Afib  appears new onset. Check 2 D echo. Holding BP meds blood pressure low on presentation. Also noted were multiple PVCs on monitor. The pain is low k and magnesium.  Code Status: DNR  Family Communication: Discussed with daughter and granddaughter Disposition Plan: Skilled nursing facility upon discharge  Consultants:  Neurology  Procedures:  None Antibiotics:  IV vancomycin and meropenem (9/15>>)    HPI/Subjective: Patient seen and examined this morning. She is well alert and oriented with improvement in her left-sided weakness.  Objective: Filed Vitals:   08/05/13 1300  BP:   Pulse: 66  Temp:   Resp: 14    Intake/Output Summary (Last 24 hours) at 08/05/13 1701 Last data filed at 08/05/13 1306  Gross per 24 hour  Intake   1763 ml  Output   1951 ml  Net   -188 ml   Filed Weights   08/03/13 2130  Weight: 67 kg (147 lb 11.3 oz)    Exam:   General:  Elderly female lying in bed in no acute distress  HEENT: No pallor, moist oral mucosa  Chest: Clear to auscultation bilaterally, no added sounds  CVS: S1-S2 is regular, no murmurs rub or gallop  Abdomen: Soft, nontender, nondistended, bowel sounds present  Extremities: Warm, no edema  CNS: AAO x3, cranial nerves intact, 4/5 power over her left extremity  Data Reviewed: Basic Metabolic Panel:  Recent Labs Lab 07/30/13 0528 08/03/13 1752 08/04/13 0437 08/04/13 2240 08/05/13 0422  NA 143 135 137 134* 132*  K 3.6 3.6 3.1* 2.8* 3.3*  CL 105 101 103 100 100  CO2 31 22 25 27 26   GLUCOSE 44* 92 108* 186* 252*  BUN  15 21 20 14 11   CREATININE 0.78 0.70 0.68 0.65 0.66  CALCIUM 9.4 9.1 8.6 8.2* 8.3*  MG  --   --   --  1.8  --    Liver Function Tests:  Recent Labs Lab 08/04/13 0437  AST 52*  ALT 25  ALKPHOS 67  BILITOT 0.6  PROT 5.6*  ALBUMIN 2.8*   No results found for this basename: LIPASE, AMYLASE,  in the last 168 hours No results found for this basename: AMMONIA,  in the last 168 hours CBC:  Recent Labs Lab 07/29/13 2014 07/30/13 0528 08/03/13 1752 08/04/13 0437 08/05/13 0422  WBC 8.2 8.2 14.1* 12.0* 8.6  NEUTROABS  --   --  12.4*  --   --   HGB 13.6 13.1 13.3 11.3* 10.3*  HCT 39.0 38.0 37.4 32.7* 30.2*  MCV 92.6 93.6 91.4 93.2 94.7  PLT 214 224 242 193 150   Cardiac Enzymes:  Recent Labs Lab 08/03/13 2222  TROPONINI <0.30   BNP (last 3 results) No results found for this basename: PROBNP,  in the last 8760 hours CBG:  Recent Labs Lab 08/05/13 0502 08/05/13 0619 08/05/13 0654 08/05/13 0752 08/05/13 1117  GLUCAP 233* 265* 231* 209* 251*    Recent Results (from the past 240 hour(s))  URINE CULTURE     Status: None   Collection Time    07/29/13  9:16 PM      Result Value Range Status   Specimen Description URINE, CLEAN CATCH   Final   Special Requests NONE   Final   Culture  Setup Time     Final   Value: 07/29/2013 22:30     Performed at [facility] Count     Final   Value: >=100,000 COLONIES/ML     Performed at [facility]   Culture     Final   Value: KLEBSIELLA PNEUMONIAE     Note: Previous Result = 60 000 COLONIES/ML CORRECTED RESULTS CALLED TO: CINDY MORRIS ON 07/31/2013 AT 9:32AM HAJAM     Performed at [facility]   Report Status 07/31/2013 FINAL   Final   Organism ID, Bacteria KLEBSIELLA PNEUMONIAE   Final  CULTURE, BLOOD (SINGLE)     Status: None   Collection Time    08/03/13  5:52 PM      Result Value Range Status   Specimen Description BLOOD FEMORAL ARTERY COLLECTED BY Ardeen Fillers [id number]   Final   Special  Requests BOTTLES DRAWN AEROBIC AND ANAEROBIC 8CC EACH   Final   Culture NO GROWTH 2 DAYS   Final   Report Status PENDING   Incomplete  URINE CULTURE     Status: None   Collection Time    08/03/13  6:00 PM      Result Value Range Status   Specimen Description URINE, CATHETERIZED   Final   Special Requests NONE   Final   Culture  Setup Time     Final   Value: 08/03/2013 22:00     Performed at [facility] Count     Final   Value: NO GROWTH     Performed at [facility]   Culture     Final   Value: NO GROWTH     Performed at [facility]   Report Status 08/04/2013 FINAL   Final  MRSA PCR SCREENING     Status: None   Collection Time    08/03/13  9:20 PM      Result Value Range Status   MRSA by PCR NEGATIVE  NEGATIVE Final   Comment:            The GeneXpert MRSA Assay (FDA     approved for NASAL specimens     only), is one component of a     comprehensive MRSA colonization     surveillance program. It is not     intended to diagnose MRSA     infection nor to guide or     monitor treatment for     MRSA infections.     Studies: Dg Chest 1 View  08/03/2013   CLINICAL DATA:  Altered mental status  EXAM: CHEST - 1 VIEW  COMPARISON:  None.  FINDINGS: The heart size and mediastinal contours are within normal limits. Status post CABG. No acute infiltrate or pulmonary edema. Bony thorax is stable.  IMPRESSION: No active disease.  Status post CABG again noted.   Electronically Signed   By: Natasha Mead   On: 08/03/2013 17:14   Ct Head Wo Contrast  08/03/2013   CLINICAL DATA:  Altered level of consciousness.  EXAM: CT HEAD WITHOUT CONTRAST  TECHNIQUE: Contiguous axial images were obtained from the base of the skull through the vertex without intravenous contrast.  COMPARISON:  07/29/2013.  FINDINGS: Age related cerebral atrophy, ventriculomegaly and periventricular white matter disease. There are also remote lacunar-type basal ganglia infarcts.  No extra-axial  fluid collections or CT findings for hemispheric infarction an or intracranial hemorrhage.  There is a 2 cm left-sided posterior fossa mass which is slightly hyperdense and is most likely a benign meningioma. It has enlarged since 2005. Given the patient's age this is probably inconsequential and does not require any further imaging evaluation.  The bony structures are intact. The paranasal sinuses and mastoid air cells are clear. The globes are intact.  IMPRESSION: Age related cerebral atrophy, ventriculomegaly and periventricular white matter disease.  No acute intracranial findings.  2 cm left-sided posterior fossa mass adjacent to the left temporal bone is most likely a benign meningioma.   Electronically Signed   By: Loralie Champagne M.D.   On: 08/03/2013 17:19   Mr Brain Wo Contrast  08/04/2013   CLINICAL DATA:  Altered mental status. Vomiting.  EXAM: MRI HEAD WITHOUT CONTRAST  TECHNIQUE: Multiplanar, multisequence MR imaging was performed. No intravenous contrast was administered.  COMPARISON:  Head CT 08/03/2013  FINDINGS: Diffusion imaging shows scattered foci of restricted diffusion in the right middle cerebral artery territory consistent with embolic infarctions. The largest region is a 2.5 cm area in the posterior temporal lobe. The other areas are less than a cm in size and present in the right temporal and frontoparietal regions. No mass lesion, hemorrhage, hydrocephalus or extra-axial collection. There chronic small vessel changes in the pons and in the cerebral hemispheric white matter. There is an old right parietal cortical infarction at the vertex. No pituitary mass. No inflammatory sinus disease. No skull or skullbase lesion. Flow void is present in the  major vessels at the base of the brain.  IMPRESSION: Background pattern of chronic small vessel disease. Areas of acute infarction in the right middle cerebral artery territory consistent with embolic disease. The largest area is a 2.5 cm region  in the posterior temporal lobe. No hemorrhage or mass effect.   Electronically Signed   By: Paulina Fusi M.D.   On: 08/04/2013 12:40   US Carotid Duplex Bilateral  08/04/2013   *RADIOLOGY REPORT*  Clinical Data: Acute right MCA territory small infarctions compatible with embolic event.  BILATERAL CAROTID DUPLEX ULTRASOUND  Technique: Wallace Cullens scale imaging, color Doppler and duplex ultrasound were performed of bilateral carotid and vertebral arteries in the neck.  Comparison:  08/04/2013 brain MRI  Criteria:  Quantification of carotid stenosis is based on velocity parameters that correlate the residual internal carotid diameter with NASCET-based stenosis levels, using the diameter of the distal internal carotid lumen as the denominator for stenosis measurement.  The following velocity measurements were obtained:                   PEAK SYSTOLIC/END DIASTOLIC RIGHT ICA:                        107/19cm/sec CCA:                        63/11cm/sec SYSTOLIC ICA/CCA RATIO:     1.7 DIASTOLIC ICA/CCA RATIO:    1.78 ECA:                        177cm/sec  LEFT ICA:                        79/12cm/sec CCA:                        62/10cm/sec SYSTOLIC ICA/CCA RATIO:     1.27 DIASTOLIC ICA/CCA RATIO:    1.19 ECA:                        152cm/sec  Findings:  RIGHT CAROTID ARTERY: Mild bifurcation heterogeneous plaque formation with echogenic shadowing.  No hemodynamically significant right ICA stenosis, velocity elevation, or turbulent flow.  Degree of narrowing less than 50%.  RIGHT VERTEBRAL ARTERY:  Antegrade  LEFT CAROTID ARTERY: Similar minor plaque formation in the bifurcation.  No hemodynamically significant left ICA stenosis, velocity elevation, or turbulent flow. Degree of narrowing also less than 50%.  LEFT VERTEBRAL ARTERY:  Antegrade  IMPRESSION: Minor carotid atherosclerosis.  No hemodynamically significant ICA stenosis by ultrasound.  Degree of narrowing less than 50% bilaterally.   Original Report Authenticated By: Judie Petit.  Miles Costain, M.D.   Dg Chest Port 1 View  08/04/2013   *RADIOLOGY REPORT*  Clinical Data: PICC line placement  PORTABLE CHEST - 1 VIEW  Comparison: 08/03/2013  Findings: Right-sided PICC line terminates 8.5 cm below the carina, over the right atrium.  No pneumothorax.  Evidence of CABG re- identified mild cardiomegaly.  Right humeral deformity re- identified.  The lungs are clear.  IMPRESSION: Right-sided PICC line tip over right atrium.  This could be pulled back 3.5 cm if cavoatrial positioning is desired.   Original Report Authenticated By: Christiana Pellant, M.D.   Dg Fluoro Guide Lumbar Puncture  08/04/2013   *RADIOLOGY REPORT*  Clinical Data:Altered mental status and fever.  Unsuccessful lumbar puncture, a  performance under fluoroscopy was requested.  LUMBAR PUNCTURE FLUORO GUIDE  Fluoroscopy Time: 54 seconds  Comparison: No similar prior study is available for comparison.  Findings: Despite multiple attempts, lumbar puncture could not be successfully performed.  Standard sterile technique and local 1% lidocaine anesthesia was utilized.  IMPRESSION: Unsuccessful attempted lumbar puncture.   Original Report Authenticated By: Christiana Pellant, M.D.    Scheduled Meds: . antiseptic oral rinse  15 mL Mouth Rinse QID  . chlorhexidine  15 mL Mouth/Throat BID  . insulin aspart  0-9 Units Subcutaneous TID WC  . meropenem (MERREM) IV  2 g Intravenous Q12H  . [START ON 08/06/2013] rivaroxaban  15 mg Oral QAC breakfast  . sodium chloride  10-40 mL Intracatheter Q12H  . sodium chloride  3 mL Intravenous Q12H  . thiamine  100 mg Intravenous Daily  . vancomycin  1,000 mg Intravenous Q12H   Continuous Infusions:     Time spent: 25 minutes    Alicia Mason  [facility] Pager [phone] If 7PM-7AM, please contact night-coverage at [email or web address], password [facility] 08/05/2013, 5:01 PM  LOS: 2 days

## 2013-08-05 NOTE — Evaluation (Signed)
Clinical/Bedside Swallow Evaluation  Patient Details  Name: Alicia Mason MRN: [id number] Date of Birth: 1922/06/25  Today's Date: 08/05/2013 Time: 8295-6213 SLP Time Calculation (min): 32 min  Past Medical History:  Past Medical History  Diagnosis Date  . Essential hypertension, benign   . Type 2 diabetes mellitus   . Coronary atherosclerosis of native coronary artery     Multivessel  . Closed fracture of right humerus     Managed conservatively 2011  . Cerebrovascular disease   . Seasonal allergies   . Arthritis   . Vertigo   . Ischemic cardiomyopathy     EF 40% per Echo 06/2011.  Marland Kitchen Near syncope   . NSTEMI (non-ST elevated myocardial infarction)     06/2011.  . Diabetes mellitus    Past Surgical History:  Past Surgical History  Procedure Laterality Date  . Coronary artery bypass graft  1999    LIMA to LAD, SVG to OM1 and OM2, SVG to PDA  . Cataract surgery    . Abdominal hysterectomy      WITH BSO.   HPI:  79 y/o female with hx of HTN, DM, CAD who was recently discharged from the hospital when she was admitted with intermittent confusion mild hypoglycemia and UTI. Her insulin dose was adjusted and discharged on ciprofloxacin. On the day of admission family was unable to reach her and called 911 as the door was locked. Patient was found sitting on the chair unresponsive with a blood glucose of 40. She was brought to the hospital found to have a fever of 103F in the ED. She was arousable to commands but clearly had left-sided weakness . An LP was attempted in the ED which was unsuccessful. She was then admitted to ICU with empiric antibiotics.  Overnight she was repeatedly hypoglycemic requiring D10 drip .  A fluoroscopy guided LP was attempted on 9/15 but was unsuccessful . An MRI of the brain done showed an acute right middle cerebral artery infarct .   Assessment / Plan / Recommendation Clinical Impression  Ms. Rathje is a delightful woman who is tolerating regular  textures and thin liquids with only mild lingual residuals (with peanut butter crackers). Lingual residuals clear with sips of liquid. No overt signs/symptoms of aspiration at bedside. Recommend regular diet with thin liquids. Ok for pills whole one at a time with liquid. Suspect pt with mild left neglect as evidenced by searching for cracker held to left and fumbling with her left hand (but she does use it in bilateral tasks).    Aspiration Risk  Mild (given diagnosis of CVA)    Diet Recommendation Regular;Thin liquid   Liquid Administration via: Cup;Straw Medication Administration: Whole meds with liquid Supervision: Patient able to self feed;Intermittent supervision to cue for compensatory strategies (assist with set-up due to ? mild left neglect) Postural Changes and/or Swallow Maneuvers: Seated upright 90 degrees;Upright 30-60 min after meal    Other  Recommendations Recommended Consults:  (PT/OT eval pending) Oral Care Recommendations: Oral care BID Other Recommendations: Clarify dietary restrictions   Follow Up Recommendations  Skilled Nursing facility (Pt lives alone but has family support nearby)    Frequency and Duration  N/A           Swallow Study Prior Functional Status   Pt lived alone PTA, family lives nearby for support. Pt consumed regular diet and thin liquids, pills whole with liquid.    General Date of Onset: 08/03/13 HPI: 79 y/o female with hx of HTN,  DM, CAD who was recently discharged from the hospital when she was admitted with intermittent confusion mild hypoglycemia and UTI. Her insulin dose was adjusted and discharged on ciprofloxacin. On the day of admission family was unable to reach her and called 911 as the door was locked. Patient was found sitting on the chair unresponsive with a blood glucose of 40. She was brought to the hospital found to have a fever of 103F in the ED. She was arousable to commands but clearly had left-sided weakness . An LP was  attempted in the ED which was unsuccessful. She was then admitted to ICU with empiric antibiotics.  Overnight she was repeatedly hypoglycemic requiring D10 drip .  A fluoroscopy guided LP was attempted on 9/15 but was unsuccessful . An MRI of the brain done showed an acute right middle cerebral artery infarct . Type of Study: Bedside swallow evaluation Previous Swallow Assessment: None on record Diet Prior to this Study: NPO Temperature Spikes Noted: Yes (Admitted with temp of 103, now afebrile) History of Recent Intubation: No Behavior/Cognition: Alert;Cooperative;Pleasant mood Oral Cavity - Dentition: Adequate natural dentition Self-Feeding Abilities: Able to feed self;Needs assist (question mild left neglect) Patient Positioning: Upright in bed Baseline Vocal Quality: Clear;Breathy Volitional Cough: Strong Volitional Swallow: Able to elicit    Oral/Motor/Sensory Function Overall Oral Motor/Sensory Function: Appears within functional limits for tasks assessed   Ice Chips Ice chips: Not tested   Thin Liquid Thin Liquid: Within functional limits Presentation: Cup;Straw;Self Fed    Nectar Thick Nectar Thick Liquid: Not tested   Honey Thick Honey Thick Liquid: Not tested   Puree Puree: Within functional limits Presentation: Spoon   Solid      Solid: Within functional limits Presentation: Self Fed Other Comments: mild lingual residuals, cleared with sips of tea      Thank you,  Havery Moros, CCC-SLP [phone]  PORTER,DABNEY 08/05/2013,10:36 AM

## 2013-08-05 NOTE — Clinical Social Work Placement (Signed)
    Clinical Social Work Department CLINICAL SOCIAL WORK PLACEMENT NOTE 08/07/2013  Patient:  Alicia Mason, Alicia Mason  Account Number:  [id number] Admit date:  08/03/2013  Clinical Social Worker:  Santa Genera, CLINICAL SOCIAL WORKER  Date/time:  08/05/2013 12:30 PM  Clinical Social Work is seeking post-discharge placement for this patient at the following level of care:   SKILLED NURSING   (*CSW will update this form in Epic as items are completed)   08/05/2013  Patient/family provided with [facility] Department of Clinical Social Work's list of facilities offering this level of care within the geographic area requested by the patient (or if unable, by the patient's family).  08/05/2013  Patient/family informed of their freedom to choose among providers that offer the needed level of care, that participate in Medicare, Medicaid or managed care program needed by the patient, have an available bed and are willing to accept the patient.  08/05/2013  Patient/family informed of [facility]' ownership interest in [facility], as well as of the fact that they are under no obligation to receive care at this facility.  PASARR submitted to EDS on 08/05/2013 PASARR number received from EDS on 08/05/2013  FL2 transmitted to all facilities in geographic area requested by pt/family on  08/05/2013 FL2 transmitted to all facilities within larger geographic area on   Patient informed that his/her managed care company has contracts with or will negotiate with  certain facilities, including the following:     Patient/family informed of bed offers received:  08/06/2013 Patient chooses bed at [facility] Physician recommends and patient chooses bed at    Patient to be transferred to [facility] on  08/07/2013 Patient to be transferred to facility by [facility] staff  The following physician request were entered in Epic:   Additional Comments:  Santa Genera,  LCSW Clinical Social Worker [phone])

## 2013-08-06 ENCOUNTER — Inpatient Hospital Stay (HOSPITAL_COMMUNITY)
Admit: 2013-08-06 | Discharge: 2013-08-06 | Disposition: A | Discharge status: Home / Self Care | Payer: Medicare Other | Attending: Neurology | Admitting: Neurology

## 2013-08-06 ENCOUNTER — Encounter (HOSPITAL_COMMUNITY): Payer: Self-pay | Admitting: Internal Medicine

## 2013-08-06 DIAGNOSIS — R7989 Other specified abnormal findings of blood chemistry: Secondary | ICD-10-CM | POA: Diagnosis present

## 2013-08-06 DIAGNOSIS — R946 Abnormal results of thyroid function studies: Secondary | ICD-10-CM

## 2013-08-06 DIAGNOSIS — D649 Anemia, unspecified: Secondary | ICD-10-CM | POA: Diagnosis present

## 2013-08-06 LAB — BASIC METABOLIC PANEL
BUN: 7 mg/dL (ref 6–23)
Calcium: 8.1 mg/dL — ABNORMAL LOW (ref 8.4–10.5)
GFR calc Af Amer: 88 mL/min — ABNORMAL LOW (ref >90)
GFR calc non Af Amer: 76 mL/min — ABNORMAL LOW (ref >90)
Glucose, Bld: 218 mg/dL — ABNORMAL HIGH (ref 70–99)
Potassium: 3.5 mEq/L (ref 3.5–5.1)

## 2013-08-06 LAB — GLUCOSE, CAPILLARY
Glucose-Capillary: 284 mg/dL — ABNORMAL HIGH (ref 70–99)
Glucose-Capillary: 242 mg/dL — ABNORMAL HIGH (ref 70–99)

## 2013-08-06 MED ORDER — INSULIN GLARGINE 100 UNIT/ML ~~LOC~~ SOLN
17.0000 [IU] | Freq: Every day | SUBCUTANEOUS | Status: DC
  Administered 2013-08-06: 17 [IU] via SUBCUTANEOUS
  Filled 2013-08-06 (×2): qty 0.17

## 2013-08-06 MED ORDER — DILTIAZEM HCL 25 MG/5ML IV SOLN: 15.0000 mg | INTRAVENOUS | Status: DC

## 2013-08-06 MED ORDER — ATORVASTATIN CALCIUM 10 MG PO TABS
10.0000 mg | ORAL_TABLET | Freq: Every day | ORAL | Status: DC
  Administered 2013-08-06: 10 mg via ORAL
  Filled 2013-08-06: qty 1

## 2013-08-06 MED ORDER — POTASSIUM CHLORIDE CRYS ER 20 MEQ PO TBCR
20.0000 meq | EXTENDED_RELEASE_TABLET | Freq: Two times a day (BID) | ORAL | Status: DC
  Administered 2013-08-06 (×2): 20 meq via ORAL
  Filled 2013-08-06 (×2): qty 1

## 2013-08-06 MED ORDER — METOPROLOL SUCCINATE ER 50 MG PO TB24
50.0000 mg | ORAL_TABLET | Freq: Two times a day (BID) | ORAL | Status: DC
  Administered 2013-08-06 – 2013-08-07 (×3): 50 mg via ORAL
  Filled 2013-08-06 (×3): qty 1

## 2013-08-06 NOTE — Progress Notes (Signed)
[facility] PROGRESS NOTE  Alicia Mason ZOX:[id number] DOB: 1922/05/22 DOA: 08/03/2013 PCP: Cassell Smiles., MD    Code Status: DNR Family Communication: Discussed with patient's daughter and granddaughter. Disposition Plan: Assisted living facility versus skilled nursing facility when ready for discharge.   Consultants:  Neurologist, Dr. [name]  Procedures:  1.) 2-D echocardiogram 08/05/2013 - Left ventricle: The cavity size was normal. There was moderate basal hypertrophy of the septum. Systolic function was mildly reduced. The estimated ejection fraction was in the range of 45% to 50%. There is hypokinesis of the basal-midinferolateral and inferior myocardium. Doppler parameters are consistent with abnormal left ventricular relaxation (grade 1 diastolic dysfunction). Doppler parameters are consistent with high ventricular filling pressure. - Aortic valve: Mildly calcified annulus. Trileaflet; mildly calcified leaflets. Mild regurgitation. - Mitral valve: Calcified annulus. Mild regurgitation - appears moderate in apical 2 chamber view. - Left atrium: The atrium was mildly dilated. - Right atrium: Central venous pressure: 8mm Hg (est). - Atrial septum: No defect or patent foramen ovale was identified. - Tricuspid valve: Trivial regurgitation. - Pulmonary arteries: PA peak pressure: 41mm Hg (S). Impressions:  - Comparison to prior study August 2012. Moderate basal septal hypertrophy with LVEF 45-50%, inferolateral hypokinesis, grade 1 diastolic dysfunction with increased filling pressures. Mild left atrial enlargement. MAC with mild to moderate mitral regurgitation. Aortic sclerosis with mild aortic regurgitation.PASP 41 mmHg. No PFO or ASD.  2. EEG, pending 3. Right upper extremity PICC.   Antibiotics:  IV vancomycin and meropenem 08/04/2013>>>  HPI/Subjective: The patient is sitting up in bed. She complains of being hungry. She is being prepped  for the EEG.  Objective: Filed Vitals:   08/06/13 0726  BP:   Pulse:   Temp: 99.1 F (37.3 C)  Resp:     Intake/Output Summary (Last 24 hours) at 08/06/13 0932 Last data filed at 08/06/13 0556  Gross per 24 hour  Intake   1263 ml  Output   2950 ml  Net  -1687 ml   Filed Weights   08/03/13 2130  Weight: 67 kg (147 lb 11.3 oz)    Exam:   General:  Pleasant alert slightly confused 79 year old Caucasian woman sitting up in bed, in no acute distress.  Cardiovascular: Irregular, irregular, with tachycardia.  Respiratory: Clear anteriorly.  Abdomen: Positive bowel sounds, soft, nontender, nondistended.  Musculoskeletal: Hypertropic joint changes seen in her knees bilaterally.  Extremities: No pedal edema. Pedal pulses thready.  Neurologic: She is alert and oriented to herself, hospital, and daughter. She is not oriented to time, year, or medical history. Strength of the left upper extremity and left lower extremity is 4+ over 5. Strength on the right is 5 over 5. Cranial nerves II through XII are grossly intact.  Data Reviewed: Basic Metabolic Panel:  Recent Labs Lab 08/03/13 1752 08/04/13 0437 08/04/13 2240 08/05/13 0422 08/06/13 0813  NA 135 137 134* 132* 138  K 3.6 3.1* 2.8* 3.3* 3.5  CL 101 103 100 100 101  CO2 22 25 27 26  32  GLUCOSE 92 108* 186* 252* 218*  BUN 21 20 14 11 7   CREATININE 0.70 0.68 0.65 0.66 0.66  CALCIUM 9.1 8.6 8.2* 8.3* 8.1*  MG  --   --  1.8  --  2.2   Liver Function Tests:  Recent Labs Lab 08/04/13 0437  AST 52*  ALT 25  ALKPHOS 67  BILITOT 0.6  PROT 5.6*  ALBUMIN 2.8*   No results found for this basename: LIPASE, AMYLASE,  in the  last 168 hours No results found for this basename: AMMONIA,  in the last 168 hours CBC:  Recent Labs Lab 08/03/13 1752 08/04/13 0437 08/05/13 0422  WBC 14.1* 12.0* 8.6  NEUTROABS 12.4*  --   --   HGB 13.3 11.3* 10.3*  HCT 37.4 32.7* 30.2*  MCV 91.4 93.2 94.7  PLT 242 193 150   Cardiac  Enzymes:  Recent Labs Lab 08/03/13 2222  TROPONINI <0.30   BNP (last 3 results) No results found for this basename: PROBNP,  in the last 8760 hours CBG:  Recent Labs Lab 08/05/13 0752 08/05/13 1117 08/05/13 1656 08/05/13 2118 08/06/13 0724  GLUCAP 209* 251* 197* 191* 284*    Recent Results (from the past 240 hour(s))  URINE CULTURE     Status: None   Collection Time    07/29/13  9:16 PM      Result Value Range Status   Specimen Description URINE, CLEAN CATCH   Final   Special Requests NONE   Final   Culture  Setup Time     Final   Value: 07/29/2013 22:30     Performed at [facility] Count     Final   Value: >=100,000 COLONIES/ML     Performed at [facility]   Culture     Final   Value: KLEBSIELLA PNEUMONIAE     Note: Previous Result = 60 000 COLONIES/ML CORRECTED RESULTS CALLED TO: CINDY MORRIS ON 07/31/2013 AT 9:32AM HAJAM     Performed at [facility]   Report Status 07/31/2013 FINAL   Final   Organism ID, Bacteria KLEBSIELLA PNEUMONIAE   Final  CULTURE, BLOOD (SINGLE)     Status: None   Collection Time    08/03/13  5:52 PM      Result Value Range Status   Specimen Description BLOOD FEMORAL ARTERY COLLECTED BY Ardeen Fillers [id number]   Final   Special Requests BOTTLES DRAWN AEROBIC AND ANAEROBIC 8CC EACH   Final   Culture NO GROWTH 2 DAYS   Final   Report Status PENDING   Incomplete  URINE CULTURE     Status: None   Collection Time    08/03/13  6:00 PM      Result Value Range Status   Specimen Description URINE, CATHETERIZED   Final   Special Requests NONE   Final   Culture  Setup Time     Final   Value: 08/03/2013 22:00     Performed at [facility] Count     Final   Value: NO GROWTH     Performed at [facility]   Culture     Final   Value: NO GROWTH     Performed at [facility]   Report Status 08/04/2013 FINAL   Final  MRSA PCR SCREENING     Status: None   Collection Time    08/03/13  9:20  PM      Result Value Range Status   MRSA by PCR NEGATIVE  NEGATIVE Final   Comment:            The GeneXpert MRSA Assay (FDA     approved for NASAL specimens     only), is one component of a     comprehensive MRSA colonization     surveillance program. It is not     intended to diagnose MRSA     infection nor to guide or     monitor  treatment for     MRSA infections.     Studies: Mr Brain Wo Contrast  08/04/2013   CLINICAL DATA:  Altered mental status. Vomiting.  EXAM: MRI HEAD WITHOUT CONTRAST  TECHNIQUE: Multiplanar, multisequence MR imaging was performed. No intravenous contrast was administered.  COMPARISON:  Head CT 08/03/2013  FINDINGS: Diffusion imaging shows scattered foci of restricted diffusion in the right middle cerebral artery territory consistent with embolic infarctions. The largest region is a 2.5 cm area in the posterior temporal lobe. The other areas are less than a cm in size and present in the right temporal and frontoparietal regions. No mass lesion, hemorrhage, hydrocephalus or extra-axial collection. There chronic small vessel changes in the pons and in the cerebral hemispheric white matter. There is an old right parietal cortical infarction at the vertex. No pituitary mass. No inflammatory sinus disease. No skull or skullbase lesion. Flow void is present in the major vessels at the base of the brain.  IMPRESSION: Background pattern of chronic small vessel disease. Areas of acute infarction in the right middle cerebral artery territory consistent with embolic disease. The largest area is a 2.5 cm region in the posterior temporal lobe. No hemorrhage or mass effect.   Electronically Signed   By: Paulina Fusi M.D.   On: 08/04/2013 12:40   US Carotid Duplex Bilateral  08/04/2013   *RADIOLOGY REPORT*  Clinical Data: Acute right MCA territory small infarctions compatible with embolic event.  BILATERAL CAROTID DUPLEX ULTRASOUND  Technique: Wallace Cullens scale imaging, color Doppler and  duplex ultrasound were performed of bilateral carotid and vertebral arteries in the neck.  Comparison:  08/04/2013 brain MRI  Criteria:  Quantification of carotid stenosis is based on velocity parameters that correlate the residual internal carotid diameter with NASCET-based stenosis levels, using the diameter of the distal internal carotid lumen as the denominator for stenosis measurement.  The following velocity measurements were obtained:                   PEAK SYSTOLIC/END DIASTOLIC RIGHT ICA:                        107/19cm/sec CCA:                        63/11cm/sec SYSTOLIC ICA/CCA RATIO:     1.7 DIASTOLIC ICA/CCA RATIO:    1.78 ECA:                        177cm/sec  LEFT ICA:                        79/12cm/sec CCA:                        62/10cm/sec SYSTOLIC ICA/CCA RATIO:     1.27 DIASTOLIC ICA/CCA RATIO:    1.19 ECA:                        152cm/sec  Findings:  RIGHT CAROTID ARTERY: Mild bifurcation heterogeneous plaque formation with echogenic shadowing.  No hemodynamically significant right ICA stenosis, velocity elevation, or turbulent flow.  Degree of narrowing less than 50%.  RIGHT VERTEBRAL ARTERY:  Antegrade  LEFT CAROTID ARTERY: Similar minor plaque formation in the bifurcation.  No hemodynamically significant left ICA stenosis, velocity elevation, or turbulent flow. Degree of narrowing also less than 50%.  LEFT VERTEBRAL ARTERY:  Antegrade  IMPRESSION: Minor carotid atherosclerosis.  No hemodynamically significant ICA stenosis by ultrasound.  Degree of narrowing less than 50% bilaterally.   Original Report Authenticated By: Judie Petit. Miles Costain, M.D.   Dg Chest Port 1 View  08/04/2013   *RADIOLOGY REPORT*  Clinical Data: PICC line placement  PORTABLE CHEST - 1 VIEW  Comparison: 08/03/2013  Findings: Right-sided PICC line terminates 8.5 cm below the carina, over the right atrium.  No pneumothorax.  Evidence of CABG re- identified mild cardiomegaly.  Right humeral deformity re- identified.  The lungs are  clear.  IMPRESSION: Right-sided PICC line tip over right atrium.  This could be pulled back 3.5 cm if cavoatrial positioning is desired.   Original Report Authenticated By: Christiana Pellant, M.D.   Dg Fluoro Guide Lumbar Puncture  08/04/2013   *RADIOLOGY REPORT*  Clinical Data:Altered mental status and fever.  Unsuccessful lumbar puncture, a performance under fluoroscopy was requested.  LUMBAR PUNCTURE FLUORO GUIDE  Fluoroscopy Time: 54 seconds  Comparison: No similar prior study is available for comparison.  Findings: Despite multiple attempts, lumbar puncture could not be successfully performed.  Standard sterile technique and local 1% lidocaine anesthesia was utilized.  IMPRESSION: Unsuccessful attempted lumbar puncture.   Original Report Authenticated By: Christiana Pellant, M.D.    Scheduled Meds: . antiseptic oral rinse  15 mL Mouth Rinse QID  . chlorhexidine  15 mL Mouth/Throat BID  . insulin aspart  0-9 Units Subcutaneous TID WC  . insulin glargine  10 Units Subcutaneous QHS  . meropenem (MERREM) IV  2 g Intravenous Q12H  . rivaroxaban  15 mg Oral QAC breakfast  . sodium chloride  10-40 mL Intracatheter Q12H  . sodium chloride  3 mL Intravenous Q12H  . thiamine  100 mg Intravenous Daily  . vancomycin  1,000 mg Intravenous Q12H   Continuous Infusions:    Assessment:  Principal Problem:   Acute ischemic stroke Active Problems:   HYPERTENSION   Atrial fibrillation   CAD (coronary artery disease)   Hx of CABG   DM type 2 (diabetes mellitus, type 2)   Ischemic cardiomyopathy   UTI (lower urinary tract infection)   Fever   Acute encephalopathy   Hypoglycemia   Abnormal thyroid blood test   Anemia   1. Acute right MCA stroke with mild left-sided hemiparesis. Stroke is likely embolic from atrial fibrillation. Carotid ultrasound reveals no significant stenosis. She is on Xarelto. EEG results pending.  Paroxysmal Atrial fibrillation with rapid ventricular response. The patient has a  history of paroxysmal atrial fibrillation i.e., it is not new. Her rate has been mostly controlled, but she was tachycardic this morning. IV diltiazem was ordered, but her heart rate decreased to the 80s and therefore he was not given. We'll start oral diltiazem.  Ischemic cardiomyopathy with a history of CAD. 2-D echocardiogram noted for an ejection fraction of 45-50% and grade 1 diastolic dysfunction. No signs of decompensation. Hold on ACE inhibitor for now because of allowing [place] of passive elevated blood pressure in the setting of acute stroke.  Hypertension. Allowing passive elevation in blood pressure. Metoprolol and quinapril are being withheld, but will restart metoprolol in light of elevated heart rate with atrial fibrillation.  Acute encephalopathy with fever. LP attempted but was not successful. Acute encephalopathy likely secondary to acute stroke and UTI. Fever has resolved. Mental status improving, but appears to be waxing and waning per family.  Klebsiella urinary tract infection. We'll continue vancomycin and meropenem for now. Consider  narrowing antibiotic tomorrow.  Abnormal thyroid function with low TSH. We'll order free T4.  Hypokalemia and hypomagnesemia. Both repleted. We'll continue potassium chloride supplementation.  Type 2 diabetes mellitus. Patient's blood glucose is increasing after an episode of hypoglycemia. We'll adjust insulin accordingly.    Plan: 1. Restart Toprol at 50 mg twice a day and then transition to 100 mg daily prehospital dose accordingly. Hold quinapril for now. 2. We'll add small dosing of diltiazem if needed. 3. Continue repletion of potassium chloride. 4. Increase Lantus to 17 units each bedtime. 5. Followup on EEG results. 6. Followup on recommendations by physical therapy, query skilled nursing facility placement versus assisted living facility placement. Query discharge tomorrow. 7. We'll add statin. We'll order fasting lipid  profile.   Time spent: 40 minutes.     [facility]  [facility] Pager [phone]. If 7PM-7AM, please contact night-coverage at [email or web address], password [facility] 08/06/2013, 9:32 AM  LOS: 3 days

## 2013-08-06 NOTE — Progress Notes (Signed)
Patient ID: Alicia Mason, female   DOB: 10/13/1922, 79 y.o.   MRN: [id number]  [facility] NEUROLOGY Alicia Mason A. [name], MD     [email or web address]          TJUANA Mason is an 79 y.o. female.   Assessment/Plan: 1. Acute right MCA cardio embolic infarct. 2. Acute confusion most multifactorial in nature including acute infarct. An EEG is pending.  3. Atrial fibrillation. The patient has been placed on appropriate anticoagulation.  The daughter complains that the patient seemed to be a little confused today over what she was on yesterday. However there appears to be no severe worsening of her overall condition. I did explain to the daughter that there can be fluctuations after stroke. The patient herself reports no new complaints.  GENERAL: She is in no acute distress.  HEENT: Neck is supple.   ABDOMEN: soft  EXTREMITIES: No edema   SKIN: Normal by inspection.    MENTAL STATUS: She is awake. She knows that she is not at home with sitting she does somebody's else's house. She is not oriented to year but is oriented to month. She follows commands well.  CRANIAL NERVES: Pupils are equal, round and reactive to light and accommodation; extra ocular movements are full, there is no significant nystagmus; visual fields are full although there may still be some extinction to double simultaneous stimulation on the left; upper and lower facial muscles are normal in strength and symmetric, there is no flattening of the nasolabial folds.  MOTOR: There appears to be mild weakness of deltoid on the left 4/5. Triceps are 5 and handgrip 5. The other extremities shows normal tone and bulk and strength.  COORDINATION: Left finger to nose is normal, right finger to nose is normal, No rest tremor; no intention tremor; no postural tremor; no bradykinesia.    Objective: Vital signs in last 24 hours: Temp:  [98.3 F (36.8 C)-99.1 F (37.3 C)] 99.1 F (37.3 C) (09/17 0726) Pulse Rate:  [66-95] 80  (09/17 0600) Resp:  [14-34] 17 (09/17 0600) BP: (87-171)/(26-69) 142/40 mmHg (09/17 0600) SpO2:  [84 %-99 %] 95 % (09/17 0600)  Intake/Output from previous day: 09/16 0701 - 09/17 0700 In: 1263 [I.V.:13; IV Piggyback:1250] Out: 2950 [Urine:2950] Intake/Output this shift:   Nutritional status: Dysphagia   Lab Results: Results for orders placed during the hospital encounter of 08/03/13 (from the past 48 hour(s))  GLUCOSE, CAPILLARY     Status: None   Collection Time    08/04/13  8:43 AM      Result Value Range   Glucose-Capillary 97  70 - 99 mg/dL   Comment 1 Documented in Chart     Comment 2 Notify RN    GLUCOSE, CAPILLARY     Status: None   Collection Time    08/04/13 10:23 AM      Result Value Range   Glucose-Capillary 83  70 - 99 mg/dL  GLUCOSE, CAPILLARY     Status: Abnormal   Collection Time    08/04/13  1:15 PM      Result Value Range   Glucose-Capillary 62 (*) 70 - 99 mg/dL   Comment 1 Notify RN    GLUCOSE, CAPILLARY     Status: None   Collection Time    08/04/13  2:57 PM      Result Value Range   Glucose-Capillary 70  70 - 99 mg/dL   Comment 1 Notify RN    GLUCOSE, CAPILLARY     Status:  Abnormal   Collection Time    08/04/13  4:27 PM      Result Value Range   Glucose-Capillary 68 (*) 70 - 99 mg/dL   Comment 1 Notify RN    GLUCOSE, CAPILLARY     Status: None   Collection Time    08/04/13  4:56 PM      Result Value Range   Glucose-Capillary 81  70 - 99 mg/dL  GLUCOSE, CAPILLARY     Status: Abnormal   Collection Time    08/04/13  7:51 PM      Result Value Range   Glucose-Capillary 118 (*) 70 - 99 mg/dL   Comment 1 Documented in Chart     Comment 2 Notify RN    GLUCOSE, CAPILLARY     Status: Abnormal   Collection Time    08/04/13  9:17 PM      Result Value Range   Glucose-Capillary 160 (*) 70 - 99 mg/dL   Comment 1 Documented in Chart     Comment 2 Notify RN    GLUCOSE, CAPILLARY     Status: Abnormal   Collection Time    08/04/13 10:19 PM       Result Value Range   Glucose-Capillary 189 (*) 70 - 99 mg/dL   Comment 1 Documented in Chart     Comment 2 Notify RN    BASIC METABOLIC PANEL     Status: Abnormal   Collection Time    08/04/13 10:40 PM      Result Value Range   Sodium 134 (*) 135 - 145 mEq/L   Potassium 2.8 (*) 3.5 - 5.1 mEq/L   Chloride 100  96 - 112 mEq/L   CO2 27  19 - 32 mEq/L   Glucose, Bld 186 (*) 70 - 99 mg/dL   BUN 14  6 - 23 mg/dL   Creatinine, Ser 1.61  0.50 - 1.10 mg/dL   Calcium 8.2 (*) 8.4 - 10.5 mg/dL   GFR calc non Af Amer 76 (*) >90 mL/min   GFR calc Af Amer 89 (*) >90 mL/min   Comment: (NOTE)     The eGFR has been calculated using the CKD EPI equation.     This calculation has not been validated in all clinical situations.     eGFR's persistently <90 mL/min signify possible Chronic Kidney     Disease.  MAGNESIUM     Status: None   Collection Time    08/04/13 10:40 PM      Result Value Range   Magnesium 1.8  1.5 - 2.5 mg/dL  GLUCOSE, CAPILLARY     Status: Abnormal   Collection Time    08/04/13 10:57 PM      Result Value Range   Glucose-Capillary 187 (*) 70 - 99 mg/dL   Comment 1 Documented in Chart     Comment 2 Notify RN    GLUCOSE, CAPILLARY     Status: Abnormal   Collection Time    08/05/13 12:11 AM      Result Value Range   Glucose-Capillary 233 (*) 70 - 99 mg/dL   Comment 1 Documented in Chart     Comment 2 Notify RN    GLUCOSE, CAPILLARY     Status: Abnormal   Collection Time    08/05/13  1:09 AM      Result Value Range   Glucose-Capillary 241 (*) 70 - 99 mg/dL  GLUCOSE, CAPILLARY     Status: Abnormal   Collection Time  08/05/13  2:04 AM      Result Value Range   Glucose-Capillary 260 (*) 70 - 99 mg/dL  GLUCOSE, CAPILLARY     Status: Abnormal   Collection Time    08/05/13  2:56 AM      Result Value Range   Glucose-Capillary 252 (*) 70 - 99 mg/dL  GLUCOSE, CAPILLARY     Status: Abnormal   Collection Time    08/05/13  3:57 AM      Result Value Range    Glucose-Capillary 263 (*) 70 - 99 mg/dL   Comment 1 Documented in Chart     Comment 2 Notify RN    BASIC METABOLIC PANEL     Status: Abnormal   Collection Time    08/05/13  4:22 AM      Result Value Range   Sodium 132 (*) 135 - 145 mEq/L   Potassium 3.3 (*) 3.5 - 5.1 mEq/L   Chloride 100  96 - 112 mEq/L   CO2 26  19 - 32 mEq/L   Glucose, Bld 252 (*) 70 - 99 mg/dL   BUN 11  6 - 23 mg/dL   Creatinine, Ser 4.09  0.50 - 1.10 mg/dL   Calcium 8.3 (*) 8.4 - 10.5 mg/dL   GFR calc non Af Amer 76 (*) >90 mL/min   GFR calc Af Amer 88 (*) >90 mL/min   Comment: (NOTE)     The eGFR has been calculated using the CKD EPI equation.     This calculation has not been validated in all clinical situations.     eGFR's persistently <90 mL/min signify possible Chronic Kidney     Disease.  CBC     Status: Abnormal   Collection Time    08/05/13  4:22 AM      Result Value Range   WBC 8.6  4.0 - 10.5 K/uL   RBC 3.19 (*) 3.87 - 5.11 MIL/uL   Hemoglobin 10.3 (*) 12.0 - 15.0 g/dL   HCT 81.1 (*) 91.4 - 78.2 %   MCV 94.7  78.0 - 100.0 fL   MCH 32.3  26.0 - 34.0 pg   MCHC 34.1  30.0 - 36.0 g/dL   RDW 95.6  21.3 - 08.6 %   Platelets 150  150 - 400 K/uL  GLUCOSE, CAPILLARY     Status: Abnormal   Collection Time    08/05/13  5:02 AM      Result Value Range   Glucose-Capillary 233 (*) 70 - 99 mg/dL   Comment 1 Documented in Chart     Comment 2 Notify RN    GLUCOSE, CAPILLARY     Status: Abnormal   Collection Time    08/05/13  6:19 AM      Result Value Range   Glucose-Capillary 265 (*) 70 - 99 mg/dL   Comment 1 Documented in Chart     Comment 2 Notify RN    GLUCOSE, CAPILLARY     Status: Abnormal   Collection Time    08/05/13  6:54 AM      Result Value Range   Glucose-Capillary 231 (*) 70 - 99 mg/dL   Comment 1 Documented in Chart     Comment 2 Notify RN    GLUCOSE, CAPILLARY     Status: Abnormal   Collection Time    08/05/13  7:52 AM      Result Value Range   Glucose-Capillary 209 (*) 70 - 99  mg/dL  GLUCOSE, CAPILLARY  Status: Abnormal   Collection Time    08/05/13 11:17 AM      Result Value Range   Glucose-Capillary 251 (*) 70 - 99 mg/dL  GLUCOSE, CAPILLARY     Status: Abnormal   Collection Time    08/05/13  4:56 PM      Result Value Range   Glucose-Capillary 197 (*) 70 - 99 mg/dL  GLUCOSE, CAPILLARY     Status: Abnormal   Collection Time    08/05/13  9:18 PM      Result Value Range   Glucose-Capillary 191 (*) 70 - 99 mg/dL   Comment 1 Documented in Chart     Comment 2 Notify RN    GLUCOSE, CAPILLARY     Status: Abnormal   Collection Time    08/06/13  7:24 AM      Result Value Range   Glucose-Capillary 284 (*) 70 - 99 mg/dL   Comment 1 Documented in Chart     Comment 2 Notify RN      Lipid Panel No results found for this basename: CHOL, TRIG, HDL, CHOLHDL, VLDL, LDLCALC,  in the last 72 hours  Studies/Results: Mr Brain Wo Contrast  08/04/2013   CLINICAL DATA:  Altered mental status. Vomiting.  EXAM: MRI HEAD WITHOUT CONTRAST  TECHNIQUE: Multiplanar, multisequence MR imaging was performed. No intravenous contrast was administered.  COMPARISON:  Head CT 08/03/2013  FINDINGS: Diffusion imaging shows scattered foci of restricted diffusion in the right middle cerebral artery territory consistent with embolic infarctions. The largest region is a 2.5 cm area in the posterior temporal lobe. The other areas are less than a cm in size and present in the right temporal and frontoparietal regions. No mass lesion, hemorrhage, hydrocephalus or extra-axial collection. There chronic small vessel changes in the pons and in the cerebral hemispheric white matter. There is an old right parietal cortical infarction at the vertex. No pituitary mass. No inflammatory sinus disease. No skull or skullbase lesion. Flow void is present in the major vessels at the base of the brain.  IMPRESSION: Background pattern of chronic small vessel disease. Areas of acute infarction in the right middle  cerebral artery territory consistent with embolic disease. The largest area is a 2.5 cm region in the posterior temporal lobe. No hemorrhage or mass effect.   Electronically Signed   By: Paulina Fusi M.D.   On: 08/04/2013 12:40   US Carotid Duplex Bilateral  08/04/2013   *RADIOLOGY REPORT*  Clinical Data: Acute right MCA territory small infarctions compatible with embolic event.  BILATERAL CAROTID DUPLEX ULTRASOUND  Technique: Wallace Cullens scale imaging, color Doppler and duplex ultrasound were performed of bilateral carotid and vertebral arteries in the neck.  Comparison:  08/04/2013 brain MRI  Criteria:  Quantification of carotid stenosis is based on velocity parameters that correlate the residual internal carotid diameter with NASCET-based stenosis levels, using the diameter of the distal internal carotid lumen as the denominator for stenosis measurement.  The following velocity measurements were obtained:                   PEAK SYSTOLIC/END DIASTOLIC RIGHT ICA:                        107/19cm/sec CCA:                        63/11cm/sec SYSTOLIC ICA/CCA RATIO:     1.7 DIASTOLIC ICA/CCA RATIO:    1.78  ECA:                        177cm/sec  LEFT ICA:                        79/12cm/sec CCA:                        62/10cm/sec SYSTOLIC ICA/CCA RATIO:     1.27 DIASTOLIC ICA/CCA RATIO:    1.19 ECA:                        152cm/sec  Findings:  RIGHT CAROTID ARTERY: Mild bifurcation heterogeneous plaque formation with echogenic shadowing.  No hemodynamically significant right ICA stenosis, velocity elevation, or turbulent flow.  Degree of narrowing less than 50%.  RIGHT VERTEBRAL ARTERY:  Antegrade  LEFT CAROTID ARTERY: Similar minor plaque formation in the bifurcation.  No hemodynamically significant left ICA stenosis, velocity elevation, or turbulent flow. Degree of narrowing also less than 50%.  LEFT VERTEBRAL ARTERY:  Antegrade  IMPRESSION: Minor carotid atherosclerosis.  No hemodynamically significant ICA stenosis by  ultrasound.  Degree of narrowing less than 50% bilaterally.   Original Report Authenticated By: Judie Petit. Miles Costain, M.D.   Dg Chest Port 1 View  08/04/2013   *RADIOLOGY REPORT*  Clinical Data: PICC line placement  PORTABLE CHEST - 1 VIEW  Comparison: 08/03/2013  Findings: Right-sided PICC line terminates 8.5 cm below the carina, over the right atrium.  No pneumothorax.  Evidence of CABG re- identified mild cardiomegaly.  Right humeral deformity re- identified.  The lungs are clear.  IMPRESSION: Right-sided PICC line tip over right atrium.  This could be pulled back 3.5 cm if cavoatrial positioning is desired.   Original Report Authenticated By: Christiana Pellant, M.D.   Dg Fluoro Guide Lumbar Puncture  08/04/2013   *RADIOLOGY REPORT*  Clinical Data:Altered mental status and fever.  Unsuccessful lumbar puncture, a performance under fluoroscopy was requested.  LUMBAR PUNCTURE FLUORO GUIDE  Fluoroscopy Time: 54 seconds  Comparison: No similar prior study is available for comparison.  Findings: Despite multiple attempts, lumbar puncture could not be successfully performed.  Standard sterile technique and local 1% lidocaine anesthesia was utilized.  IMPRESSION: Unsuccessful attempted lumbar puncture.   Original Report Authenticated By: Christiana Pellant, M.D.    Medications:  Scheduled Meds: . antiseptic oral rinse  15 mL Mouth Rinse QID  . chlorhexidine  15 mL Mouth/Throat BID  . diltiazem  15 mg Intravenous STAT  . insulin aspart  0-9 Units Subcutaneous TID WC  . insulin glargine  10 Units Subcutaneous QHS  . meropenem (MERREM) IV  2 g Intravenous Q12H  . rivaroxaban  15 mg Oral QAC breakfast  . sodium chloride  10-40 mL Intracatheter Q12H  . sodium chloride  3 mL Intravenous Q12H  . thiamine  100 mg Intravenous Daily  . vancomycin  1,000 mg Intravenous Q12H   Continuous Infusions:  PRN Meds:.acetaminophen, acetaminophen, dextrose, glucose-Vitamin C, ondansetron (ZOFRAN) IV, ondansetron, sodium chloride      LOS: 3 days   Mariyam Remington A. Gerilyn Mason, M.D.  Diplomate, Biomedical engineer of Psychiatry and Neurology ( Neurology).

## 2013-08-06 NOTE — Progress Notes (Signed)
EEG Completed; Results Pending  

## 2013-08-06 NOTE — Progress Notes (Signed)
PT Cancellation Note  Patient Details Name: Alicia Mason MRN: [id number] DOB: [birth year]   Cancelled Treatment:    Reason Eval/Treat Not Completed: Patient declined, no reason specified   Lurena Nida, PTA/CLT 08/06/2013, 3:10 PM

## 2013-08-06 NOTE — Clinical Social Work Note (Signed)
CSW gave family and patient bed offer received ([facility]).   Family wants [facility], CSW will talk to admissions to see if they can offer bed and keep family and patient advised.  Santa Genera, LCSW Clinical Social Worker [phone])

## 2013-08-06 NOTE — Progress Notes (Signed)
Inpatient Diabetes Program Recommendations  AACE/ADA: New Consensus Statement on Inpatient Glycemic Control (2013)  Target Ranges:  Prepandial:   less than 140 mg/dL      Peak postprandial:   less than 180 mg/dL (1-2 hours)      Critically ill patients:  140 - 180 mg/dL  Results for Alicia Mason, Alicia Mason (MRN [id number]) as of 08/06/2013 09:51  Ref. Range 08/05/2013 07:52 08/05/2013 11:17 08/05/2013 16:56 08/05/2013 21:18 08/06/2013 07:24  Glucose-Capillary Latest Range: 70-99 mg/dL 409 (H) 811 (H) 914 (H) 191 (H) 284 (H)     Inpatient Diabetes Program Recommendations Insulin - Basal: Please consider increasing Lantus to 15 units QHS.  Note: Noted Lantus 10 units QHS was started yesterday and patient received last night.  However, fasting blood glucose was 284 mg/dl this morning.  Please consider increasing Lantus to 15 units QHS.  Thanks, Orlando Penner, RN, MSN, CCRN Diabetes Coordinator Inpatient Diabetes Program [phone] (Team Pager) [phone] ([place] office) [phone] [facility] office)

## 2013-08-07 ENCOUNTER — Inpatient Hospital Stay
Admission: RE | Admit: 2013-08-07 | Discharge: 2013-10-22 | Disposition: A | Discharge status: Home / Self Care | Payer: Medicare Other | Source: Ambulatory Visit | Attending: Internal Medicine | Admitting: Internal Medicine

## 2013-08-07 ENCOUNTER — Other Ambulatory Visit: Payer: Self-pay | Admitting: *Deleted

## 2013-08-07 ENCOUNTER — Encounter (HOSPITAL_COMMUNITY): Payer: Self-pay | Admitting: Internal Medicine

## 2013-08-07 DIAGNOSIS — E785 Hyperlipidemia, unspecified: Secondary | ICD-10-CM | POA: Diagnosis present

## 2013-08-07 DIAGNOSIS — E119 Type 2 diabetes mellitus without complications: Secondary | ICD-10-CM

## 2013-08-07 LAB — CBC
HCT: 35.3 % — ABNORMAL LOW (ref 36.0–46.0)
Hemoglobin: 11.9 g/dL — ABNORMAL LOW (ref 12.0–15.0)
MCV: 93.9 fL (ref 78.0–100.0)
RDW: 12.6 % (ref 11.5–15.5)
WBC: 8.8 10*3/uL (ref 4.0–10.5)

## 2013-08-07 LAB — BASIC METABOLIC PANEL
BUN: 5 mg/dL — ABNORMAL LOW (ref 6–23)
CO2: 33 mEq/L — ABNORMAL HIGH (ref 19–32)
Chloride: 101 mEq/L (ref 96–112)
Creatinine, Ser: 0.58 mg/dL (ref 0.50–1.10)
Potassium: 3.2 mEq/L — ABNORMAL LOW (ref 3.5–5.1)

## 2013-08-07 LAB — LIPID PANEL
Cholesterol: 108 mg/dL (ref 0–200)
LDL Cholesterol: 51 mg/dL (ref 0–99)
Triglycerides: 118 mg/dL (ref <150)

## 2013-08-07 LAB — GLUCOSE, CAPILLARY: Glucose-Capillary: 71 mg/dL (ref 70–99)

## 2013-08-07 MED ORDER — DEXTROSE 50 % IV SOLN
0.5000 | INTRAVENOUS | Status: AC
  Administered 2013-08-07: 25 mL via INTRAVENOUS

## 2013-08-07 MED ORDER — ALPRAZOLAM 0.5 MG PO TABS: 0.5000 mg | ORAL_TABLET | Freq: Three times a day (TID) | ORAL | Status: DC | PRN

## 2013-08-07 MED ORDER — POTASSIUM CHLORIDE CRYS ER 20 MEQ PO TBCR
30.0000 meq | EXTENDED_RELEASE_TABLET | Freq: Two times a day (BID) | ORAL | Status: DC
  Administered 2013-08-07: 10:00:00 30 meq via ORAL
  Filled 2013-08-07 (×2): qty 1

## 2013-08-07 MED ORDER — POTASSIUM CHLORIDE CRYS ER 20 MEQ PO TBCR: 20.0000 meq | EXTENDED_RELEASE_TABLET | Freq: Every day | ORAL | Status: DC

## 2013-08-07 MED ORDER — QUINAPRIL HCL 20 MG PO TABS: 20.0000 mg | ORAL_TABLET | Freq: Every day | ORAL | Status: DC

## 2013-08-07 MED ORDER — DEXTROSE 50 % IV SOLN
INTRAVENOUS | Status: AC
  Filled 2013-08-07: qty 50

## 2013-08-07 MED ORDER — ALPRAZOLAM 0.5 MG PO TABS: ORAL_TABLET | ORAL | Status: DC

## 2013-08-07 MED ORDER — MIRABEGRON ER 25 MG PO TB24: 25.0000 mg | ORAL_TABLET | Freq: Every day | ORAL | Status: DC

## 2013-08-07 MED ORDER — INSULIN GLARGINE 100 UNIT/ML ~~LOC~~ SOLN: 10.0000 [IU] | Freq: Two times a day (BID) | SUBCUTANEOUS | Status: DC

## 2013-08-07 MED ORDER — RIVAROXABAN 15 MG PO TABS: 15.0000 mg | ORAL_TABLET | Freq: Every day | ORAL | Status: DC

## 2013-08-07 MED ORDER — METOPROLOL SUCCINATE ER 50 MG PO TB24: 50.0000 mg | ORAL_TABLET | Freq: Two times a day (BID) | ORAL | Status: DC

## 2013-08-07 MED ORDER — DEXTROSE 50 % IV SOLN: 25.0000 mL | INTRAVENOUS | Status: DC | PRN

## 2013-08-07 NOTE — Progress Notes (Signed)
Inpatient Diabetes Program Recommendations  AACE/ADA: New Consensus Statement on Inpatient Glycemic Control (2013)  Target Ranges:  Prepandial:   less than 140 mg/dL      Peak postprandial:   less than 180 mg/dL (1-2 hours)      Critically ill patients:  140 - 180 mg/dL   Results for TANAY, MASSIAH (MRN [id number]) as of 08/07/2013 10:23  Ref. Range 08/06/2013 07:24 08/06/2013 10:48 08/06/2013 16:38 08/06/2013 21:13 08/07/2013 07:42  Glucose-Capillary Latest Range: 70-99 mg/dL 409 (H) 811 (H) 914 (H) 143 (H) 71    Inpatient Diabetes Program Recommendations Insulin - Basal: Please consider decreasing Lantus to 15 units QHS.  Note: Noted Lantus was increased last night to 17 units and fasting blood glucose this morning was 71 mg/dl.  Please consider decreasing Lantus to 15 units QHS.  Will continue to follow.    Thanks, Orlando Penner, RN, MSN, CCRN Diabetes Coordinator Inpatient Diabetes Program [phone] (Team Pager) [phone] ([place] office) [phone] [facility] office)

## 2013-08-07 NOTE — Discharge Summary (Signed)
Physician Discharge Summary  Alicia Mason:[id number] DOB: 08-Jan-1922 DOA: 08/03/2013  PCP: Cassell Smiles., MD  Admit date: 08/03/2013 Discharge date: 08/07/2013  Time spent: Greater than 30 minutes  Recommendations for Outpatient Follow-up:  1. Toprol-XL was changed to 100 mg daily to 50 mg twice a day to avoid significant decrease in her blood pressure in the setting of acute stroke. It can be readjusted to 100 mg daily next week. 2. Quinapril was decreased from 40 mg to 20 mg daily to avoid significant decrease in her blood pressure in the setting of acute stroke. It can be increased back to 40 mg in the next 5-7 days. 3. Lantus was decreased from 25 mg each bedtime to 10 mg twice a day. Hold for hypoglycemia. Recommend checking blood glucose twice daily. 4. Recheck the patient's serum potassium in 5-7 days. 5. Free T4 pending. We'll need to follow the result when available.  Discharge Diagnoses:   1. Acute right MCA stroke, cardioembolic from paroxysmal atrial fibrillation. 2. Mild residual encephalopathy and mild residual left-sided weakness secondary to the acute stroke. Unresponsiveness/acute encephalopathy on admission secondary to hypoglycemia, fever, and stroke. 3. Paroxysmal atrial fibrillation. Now on chronic anticoagulation with Xarelto. 4. Fever. Possibly secondary to residual urinary tract infection. Resolved. 5. Klebsiella urinary tract infection, diagnosed during previous hospitalization. 6. Coronary artery disease, history of CABG, known ischemic cardiomyopathy. Ejection fraction 45-50% per 2-D echocardiogram during this hospitalization. 7. Dyslipidemia. 8. Type 2 diabetes mellitus with hypoglycemia. 9. Hypertension. 10. Normocytic anemia. Hemoglobin 11.9 prior to discharge. 11. Hypokalemia. Serum potassium 3.2 prior to discharge.  Discharge Condition: Improved.  Diet recommendation: Heart healthy/carbohydrate modified.  Filed Weights   08/03/13 2130 08/07/13  0500  Weight: 67 kg (147 lb 11.3 oz) 69.3 kg (152 lb 12.5 oz)    History of present illness:  The patient is an 79 year old woman with a history of coronary artery disease, diabetes mellitus, and ischemic cardiomyopathy, who presented to the emergency department on 08/03/2013 with a report of unresponsiveness, fever, and low blood sugar. She was recently hospitalized for treatment of a urinary tract infection. She was discharged on Cipro. In the emergency department, she was febrile with a temperature of 103.1, and otherwise hemodynamically stable. Her urinalysis was unremarkable. Her ABG revealed a pH of 7.5, PCO2 of 30, and PO2 of 87. Her blood glucose was 90, those reported as 40 at home. She was admitted for further evaluation and management.  Hospital Course:   1. Fever/acute encephalopathy. There was concern for meningitis. Therefore, an LP was attempted by the ED physician. After 3 attempts, it was unsuccessful. Interventional radiology also attempted the LP, but it was unsuccessful. Noncontrasted CT of her head revealed diffuse cortical atrophy and old lacunar infarcts in the right basal ganglia, but no acute abnormalities. The patient had anatomy that may the procedure difficult. She was started on broad-spectrum antibiotic treatment with meropenem and vancomycin. Her neurological status was monitored. A cooling blanket was ordered. She was given antipyretics as needed. Blood cultures were ordered. They remained negative during hospitalization. The patient's fever resolved. The etiology of her fever was not clearly elucidated, although, she was recently diagnosed with Klebsiella urinary tract infection. Her followup urinalysis is unremarkable. She improved clinically. She received 5 days of IV antibiotics during hospitalization and several days of antibiotics prior to hospital admission. All antibiotics were discontinued upon discharge.  2. Acute right MCA stroke. For further evaluation of her  encephalopathy, MRI of her brain was ordered. It revealed  areas of acute infarction in the right middle cerebral artery territory. She was found to have atrial fibrillation on the EKG. She had a remote history of atrial fibrillation following CABG operation. Given the presence of atrial fibrillation and acute stroke, anticoagulation was started with Xarelto. Neurologist Dr. [name] evaluated the patient and agreed. Additional studies were ordered. Bilateral carotid ultrasound revealed no significant ICA stenosis. 2-D echocardiogram revealed known cardiomyopathy with an ejection fraction of 45-50%. EEG revealed a normal study. She was evaluated by the therapists. Short-term skilled nursing facility placement was recommended for strengthening and rehabilitation. She and her family agreed. The patient has mild left-sided weakness and mild residual confusion, but her acute encephalopathy resolved.  3. Paroxysmal atrial fibrillation. The patient had rapid ventricular response periodically during hospitalization. However, this was thought to be secondary to holding Toprol-XL. As above pressure increased, Toprol-XL was restarted. Her heart rate improved. Xarelto was started and was maintained.  4. CAD/ischemic cardiomyopathy. The patient's 2-D echocardiogram revealed an ejection fraction of 45-50% and grade 1 diastolic dysfunction. She is on an ACE inhibitor chronically, but was temporarily withheld to avoid hypotension in the setting of an acute stroke. Eventually, she was restarted on Toprol-XL and quinapril at half the dose. Quinapril can be titrated back up to 40 mg in the next few days. There were no signs of cardiac decompensation during hospitalization.  5. Hypertension. As stated above, Toprol-XL and quinapril were eventually restarted with modifications. Toprol-XL can be transitioned to 100 mg daily and quinapril can be transitioned to 40 mg daily in the next few days.  6. Klebsiella urinary tract  infection. She was treated accordingly with antibiotics.  7. Abnormal thyroid function tests. Her TSH was low. Free T4 was ordered and the result was pending at the time of discharge.  8. Hypokalemia and hypomagnesemia. Both were repleted orally and intravenously. Her serum potassium was still low at the time of discharge, but this is likely secondary to quinapril being held. She was discharged on potassium chloride supplementation. With the restart of quinapril, her serum potassium should be improved.  9. Type 2 diabetes mellitus with hypoglycemia. The patient was treated for hypoglycemia in the emergency department appropriately. When her blood glucose improved, Lantus and sliding-scale NovoLog were restarted.  Adjustments were made as the patient had an asymptomatic episode of hypoglycemia with a blood glucose of 66. Lantus was decreased to 10 mg twice a day. Her capillary blood glucose will need to be monitored closely.    Procedures:  Right upper extremity PICC and removal  EEG -D echocardiogram 08/05/2013  - Left ventricle: The cavity size was normal. There was moderate basal hypertrophy of the septum. Systolic function was mildly reduced. The estimated ejection fraction was in the range of 45% to 50%. There is hypokinesis of the basal-midinferolateral and inferior myocardium. Doppler parameters are consistent with abnormal left ventricular relaxation (grade 1 diastolic dysfunction). Doppler parameters are consistent with high ventricular filling pressure. - Aortic valve: Mildly calcified annulus. Trileaflet; mildly calcified leaflets. Mild regurgitation. - Mitral valve: Calcified annulus. Mild regurgitation - appears moderate in apical 2 chamber view. - Left atrium: The atrium was mildly dilated. - Right atrium: Central venous pressure: 8mm Hg (est). - Atrial septum: No defect or patent foramen ovale was identified. - Tricuspid valve: Trivial regurgitation. - Pulmonary  arteries: PA peak pressure: 41mm Hg (S). Impressions:  - Comparison to prior study August 2012. Moderate basal septal hypertrophy with LVEF 45-50%, inferolateral hypokinesis, grade 1 diastolic dysfunction with increased  filling pressures. Mild left atrial enlargement. MAC with mild to moderate mitral regurgitation. Aortic sclerosis with mild aortic regurgitation.PASP 41 mmHg. No PFO or ASD.   Consultations:  Neurologist, Dr. [name]  Discharge Exam: Filed Vitals:   08/07/13 1100  BP:   Pulse: 65  Temp:   Resp: 22   blood pressure 156/50 temperature 97.5 oxygen saturation 99% on room air  General: Pleasant alert 79 year old woman sitting up in bed, in no acute distress. Cardiovascular: S1, S2, with ectopic beats versus irregular irregular. Respiratory: Clear to auscultation bilaterally. Neurologic: She is alert and oriented to herself and hospital. No significant cranial nerve deficits. 4+ -5 minus over 5 left upper extremity and left lower extremity strength. Strength on the right is 5 over 5.  Discharge Instructions  Discharge Orders   Future Orders Complete By Expires   Diet - low sodium heart healthy  As directed    Diet Carb Modified  As directed    Increase activity slowly  As directed        Medication List    STOP taking these medications       ciprofloxacin 500 MG tablet  Commonly known as:  CIPRO     meclizine 12.5 MG tablet  Commonly known as:  ANTIVERT      TAKE these medications       ALPRAZolam 0.5 MG tablet  Commonly known as:  XANAX  Take 1 tablet (0.5 mg total) by mouth every 8 (eight) hours as needed for anxiety.     insulin glargine 100 UNIT/ML injection  Commonly known as:  LANTUS  Inject 0.1 mLs (10 Units total) into the skin 2 (two) times daily.     metoprolol succinate 50 MG 24 hr tablet  Commonly known as:  TOPROL-XL  Take 1 tablet (50 mg total) by mouth 2 (two) times daily. Take with or immediately following a meal.      mirabegron ER 25 MG Tb24 tablet  Commonly known as:  MYRBETRIQ  Take 1 tablet (25 mg total) by mouth daily.     potassium chloride SA 20 MEQ tablet  Commonly known as:  K-DUR,KLOR-CON  Take 1 tablet (20 mEq total) by mouth daily.     pravastatin 10 MG tablet  Commonly known as:  PRAVACHOL  Take 10 mg by mouth daily.     quinapril 20 MG tablet  Commonly known as:  ACCUPRIL  Take 1 tablet (20 mg total) by mouth daily.     Rivaroxaban 15 MG Tabs tablet  Commonly known as:  XARELTO  Take 1 tablet (15 mg total) by mouth daily before breakfast.       Allergies  Allergen Reactions  . Cephalexin     NOT SURE OF THE REACTION, BUT WAS TOLD NOT TO TAKE IT. Granddaughter mentioned that patient had hives, rash. Denies anaphylactic reaction.      The results of significant diagnostics from this hospitalization (including imaging, microbiology, ancillary and laboratory) are listed below for reference.    Significant Diagnostic Studies: Dg Chest 1 View  08/03/2013   CLINICAL DATA:  Altered mental status  EXAM: CHEST - 1 VIEW  COMPARISON:  None.  FINDINGS: The heart size and mediastinal contours are within normal limits. Status post CABG. No acute infiltrate or pulmonary edema. Bony thorax is stable.  IMPRESSION: No active disease.  Status post CABG again noted.   Electronically Signed   By: Natasha Mead   On: 08/03/2013 17:14   Dg Chest 2 View  07/29/2013   *RADIOLOGY REPORT*  Clinical Data: Altered mental status  CHEST - 2 VIEW  Comparison: July 11, 2011.  Findings: Status post coronary artery bypass graft. Cardiomediastinal silhouette appears normal.  No pneumothorax or pleural effusion is noted.  No acute pulmonary disease is noted.  IMPRESSION: No acute cardiopulmonary abnormality seen.   Original Report Authenticated By: Lupita Raider.,  M.D.   Ct Head Wo Contrast  08/03/2013   CLINICAL DATA:  Altered level of consciousness.  EXAM: CT HEAD WITHOUT CONTRAST  TECHNIQUE: Contiguous axial  images were obtained from the base of the skull through the vertex without intravenous contrast.  COMPARISON:  07/29/2013.  FINDINGS: Age related cerebral atrophy, ventriculomegaly and periventricular white matter disease. There are also remote lacunar-type basal ganglia infarcts.  No extra-axial fluid collections or CT findings for hemispheric infarction an or intracranial hemorrhage.  There is a 2 cm left-sided posterior fossa mass which is slightly hyperdense and is most likely a benign meningioma. It has enlarged since 2005. Given the patient's age this is probably inconsequential and does not require any further imaging evaluation.  The bony structures are intact. The paranasal sinuses and mastoid air cells are clear. The globes are intact.  IMPRESSION: Age related cerebral atrophy, ventriculomegaly and periventricular white matter disease.  No acute intracranial findings.  2 cm left-sided posterior fossa mass adjacent to the left temporal bone is most likely a benign meningioma.   Electronically Signed   By: Loralie Champagne M.D.   On: 08/03/2013 17:19   Ct Head Wo Contrast  07/29/2013   *RADIOLOGY REPORT*  Clinical Data: Altered mental status  CT HEAD WITHOUT CONTRAST  Technique:  Contiguous axial images were obtained from the base of the skull through the vertex without contrast.  Comparison: CT scan of July 11, 2011.  Findings: Bony calvarium appears intact.  Diffuse cortical atrophy is noted.  No mass effect or midline shift is noted.  Ventricular size is within normal limits.  Old lacunar infarctions are noted in the right basal ganglia.  There is no evidence of mass lesion, hemorrhage or acute infarction.  IMPRESSION: Diffuse cortical atrophy.  Old lacunar infarctions in right basal ganglia.  No acute intracranial abnormality seen.   Original Report Authenticated By: Lupita Raider.,  M.D.   Mr Brain Wo Contrast  08/04/2013   CLINICAL DATA:  Altered mental status. Vomiting.  EXAM: MRI HEAD WITHOUT  CONTRAST  TECHNIQUE: Multiplanar, multisequence MR imaging was performed. No intravenous contrast was administered.  COMPARISON:  Head CT 08/03/2013  FINDINGS: Diffusion imaging shows scattered foci of restricted diffusion in the right middle cerebral artery territory consistent with embolic infarctions. The largest region is a 2.5 cm area in the posterior temporal lobe. The other areas are less than a cm in size and present in the right temporal and frontoparietal regions. No mass lesion, hemorrhage, hydrocephalus or extra-axial collection. There chronic small vessel changes in the pons and in the cerebral hemispheric white matter. There is an old right parietal cortical infarction at the vertex. No pituitary mass. No inflammatory sinus disease. No skull or skullbase lesion. Flow void is present in the major vessels at the base of the brain.  IMPRESSION: Background pattern of chronic small vessel disease. Areas of acute infarction in the right middle cerebral artery territory consistent with embolic disease. The largest area is a 2.5 cm region in the posterior temporal lobe. No hemorrhage or mass effect.   Electronically Signed   By: Paulina Fusi  M.D.   On: 08/04/2013 12:40   US Carotid Duplex Bilateral  08/04/2013   *RADIOLOGY REPORT*  Clinical Data: Acute right MCA territory small infarctions compatible with embolic event.  BILATERAL CAROTID DUPLEX ULTRASOUND  Technique: Wallace Cullens scale imaging, color Doppler and duplex ultrasound were performed of bilateral carotid and vertebral arteries in the neck.  Comparison:  08/04/2013 brain MRI  Criteria:  Quantification of carotid stenosis is based on velocity parameters that correlate the residual internal carotid diameter with NASCET-based stenosis levels, using the diameter of the distal internal carotid lumen as the denominator for stenosis measurement.  The following velocity measurements were obtained:                   PEAK SYSTOLIC/END DIASTOLIC RIGHT ICA:                         107/19cm/sec CCA:                        63/11cm/sec SYSTOLIC ICA/CCA RATIO:     1.7 DIASTOLIC ICA/CCA RATIO:    1.78 ECA:                        177cm/sec  LEFT ICA:                        79/12cm/sec CCA:                        62/10cm/sec SYSTOLIC ICA/CCA RATIO:     1.27 DIASTOLIC ICA/CCA RATIO:    1.19 ECA:                        152cm/sec  Findings:  RIGHT CAROTID ARTERY: Mild bifurcation heterogeneous plaque formation with echogenic shadowing.  No hemodynamically significant right ICA stenosis, velocity elevation, or turbulent flow.  Degree of narrowing less than 50%.  RIGHT VERTEBRAL ARTERY:  Antegrade  LEFT CAROTID ARTERY: Similar minor plaque formation in the bifurcation.  No hemodynamically significant left ICA stenosis, velocity elevation, or turbulent flow. Degree of narrowing also less than 50%.  LEFT VERTEBRAL ARTERY:  Antegrade  IMPRESSION: Minor carotid atherosclerosis.  No hemodynamically significant ICA stenosis by ultrasound.  Degree of narrowing less than 50% bilaterally.   Original Report Authenticated By: Judie Petit. Miles Costain, M.D.   Dg Chest Port 1 View  08/04/2013   *RADIOLOGY REPORT*  Clinical Data: PICC line placement  PORTABLE CHEST - 1 VIEW  Comparison: 08/03/2013  Findings: Right-sided PICC line terminates 8.5 cm below the carina, over the right atrium.  No pneumothorax.  Evidence of CABG re- identified mild cardiomegaly.  Right humeral deformity re- identified.  The lungs are clear.  IMPRESSION: Right-sided PICC line tip over right atrium.  This could be pulled back 3.5 cm if cavoatrial positioning is desired.   Original Report Authenticated By: Christiana Pellant, M.D.   Dg Fluoro Guide Lumbar Puncture  08/04/2013   *RADIOLOGY REPORT*  Clinical Data:Altered mental status and fever.  Unsuccessful lumbar puncture, a performance under fluoroscopy was requested.  LUMBAR PUNCTURE FLUORO GUIDE  Fluoroscopy Time: 54 seconds  Comparison: No similar prior study is available for comparison.   Findings: Despite multiple attempts, lumbar puncture could not be successfully performed.  Standard sterile technique and local 1% lidocaine anesthesia was utilized.  IMPRESSION: Unsuccessful attempted lumbar puncture.   Original Report Authenticated By:  Christiana Pellant, M.D.    Microbiology: Recent Results (from the past 240 hour(s))  URINE CULTURE     Status: None   Collection Time    07/29/13  9:16 PM      Result Value Range Status   Specimen Description URINE, CLEAN CATCH   Final   Special Requests NONE   Final   Culture  Setup Time     Final   Value: 07/29/2013 22:30     Performed at [facility] Count     Final   Value: >=100,000 COLONIES/ML     Performed at [facility]   Culture     Final   Value: KLEBSIELLA PNEUMONIAE     Note: Previous Result = 60 000 COLONIES/ML CORRECTED RESULTS CALLED TO: CINDY MORRIS ON 07/31/2013 AT 9:32AM HAJAM     Performed at [facility]   Report Status 07/31/2013 FINAL   Final   Organism ID, Bacteria KLEBSIELLA PNEUMONIAE   Final  CULTURE, BLOOD (SINGLE)     Status: None   Collection Time    08/03/13  5:52 PM      Result Value Range Status   Specimen Description BLOOD FEMORAL ARTERY COLLECTED BY Ardeen Fillers ZOXWR   Final   Special Requests BOTTLES DRAWN AEROBIC AND ANAEROBIC 8CC EACH   Final   Culture NO GROWTH 4 DAYS   Final   Report Status PENDING   Incomplete  URINE CULTURE     Status: None   Collection Time    08/03/13  6:00 PM      Result Value Range Status   Specimen Description URINE, CATHETERIZED   Final   Special Requests NONE   Final   Culture  Setup Time     Final   Value: 08/03/2013 22:00     Performed at [facility] Count     Final   Value: NO GROWTH     Performed at [facility]   Culture     Final   Value: NO GROWTH     Performed at [facility]   Report Status 08/04/2013 FINAL   Final  MRSA PCR SCREENING     Status: None   Collection Time    08/03/13  9:20  PM      Result Value Range Status   MRSA by PCR NEGATIVE  NEGATIVE Final   Comment:            The GeneXpert MRSA Assay (FDA     approved for NASAL specimens     only), is one component of a     comprehensive MRSA colonization     surveillance program. It is not     intended to diagnose MRSA     infection nor to guide or     monitor treatment for     MRSA infections.     Labs: Basic Metabolic Panel:  Recent Labs Lab 08/04/13 0437 08/04/13 2240 08/05/13 0422 08/06/13 0813 08/07/13 0537  NA 137 134* 132* 138 140  K 3.1* 2.8* 3.3* 3.5 3.2*  CL 103 100 100 101 101  CO2 25 27 26  32 33*  GLUCOSE 108* 186* 252* 218* 66*  BUN 20 14 11 7  5*  CREATININE 0.68 0.65 0.66 0.66 0.58  CALCIUM 8.6 8.2* 8.3* 8.1* 8.6  MG  --  1.8  --  2.2  --    Liver Function Tests:  Recent Labs Lab 08/04/13 0437  AST  52*  ALT 25  ALKPHOS 67  BILITOT 0.6  PROT 5.6*  ALBUMIN 2.8*   No results found for this basename: LIPASE, AMYLASE,  in the last 168 hours No results found for this basename: AMMONIA,  in the last 168 hours CBC:  Recent Labs Lab 08/03/13 1752 08/04/13 0437 08/05/13 0422 08/07/13 0537  WBC 14.1* 12.0* 8.6 8.8  NEUTROABS 12.4*  --   --   --   HGB 13.3 11.3* 10.3* 11.9*  HCT 37.4 32.7* 30.2* 35.3*  MCV 91.4 93.2 94.7 93.9  PLT 242 193 150 190   Cardiac Enzymes:  Recent Labs Lab 08/03/13 2222  TROPONINI <0.30   BNP: BNP (last 3 results) No results found for this basename: PROBNP,  in the last 8760 hours CBG:  Recent Labs Lab 08/06/13 1048 08/06/13 1638 08/06/13 2113 08/07/13 0742 08/07/13 1156  GLUCAP 242* 164* 143* 71 255*       Signed:  Hollye Pritt  [facility] 08/07/2013, 12:39 PM

## 2013-08-07 NOTE — Progress Notes (Signed)
UR chart review completed.  

## 2013-08-07 NOTE — Progress Notes (Signed)
Patient out via wheelchair with Talbert Cage, NT for transfer to the [facility] via the tunnel.  Patient accompanied by family.  Patient doing well and no acute distress noted.

## 2013-08-07 NOTE — Clinical Social Work Note (Signed)
Pt d/c to [facility] today. Pt and facility are aware and agreeable. D/c summary faxed. Pt to transfer with RN.   Cameron Ali SWK Intern

## 2013-08-07 NOTE — Procedures (Signed)
[facility] Lilac Hoff A. [name], MD     [email or web address]          NAMEOTILLIA, CORDONE               ACCOUNT NO.:  [id number]  MEDICAL RECORD NO.:  [id number]  LOCATION:  EE                           FACILITY:  [facility]  PHYSICIAN:  Zianne Schubring A. [name], M.D. DATE OF BIRTH:  10/28/1922  DATE OF PROCEDURE: DATE OF DISCHARGE:  08/06/2013                             EEG INTERPRETATION   HISTORY:  This is a 79 year old lady who presents with altered mental status and confusion.  The study is being done to evaluate for nonconvulsive seizures.  MEDICATIONS:  Cardizem, insulin, Meropenem, Zofran, vancomycin.  ANALYSIS:  A 16-channel recording using standard October 10/20 measurement is carried out for 24 minutes.  There is a posterior dominant rhythm of 8 hertz, which attenuates with eye opening.  Photic stimulation and hyperventilation were not carried out.  There is no focal or lateralized slowing.  There is no epileptiform activity observed.  IMPRESSION:  Normal recording of awake and drowsy states.     Marvelle Caudill A. [name], M.D.     KAD/MEDQ  D:  08/07/2013  T:  08/07/2013  Job:  [id number]

## 2013-08-07 NOTE — Care Management Note (Signed)
    Page 1 of 1   08/07/2013     12:54:22 PM   CARE MANAGEMENT NOTE 08/07/2013  Patient:  ALANNY, RIVERS   Account Number:  [id number]  Date Initiated:  08/07/2013  Documentation initiated by:  Sharrie Rothman  Subjective/Objective Assessment:   Pt admitted from home with CVA. Pt discharging to SNF at discharge.     Action/Plan:   Pt discharged today to [facility]. CSW to arrange discharge to facility.   Anticipated DC Date:  08/07/2013   Anticipated DC Plan:  SKILLED NURSING FACILITY  In-house referral  Clinical Social Worker      DC Planning Services  CM consult      Choice offered to / List presented to:             Status of service:  Completed, signed off Medicare Important Message given?  YES (If response is "NO", the following Medicare IM given date fields will be blank) Date Medicare IM given:  08/07/2013 Date Additional Medicare IM given:    Discharge Disposition:  SKILLED NURSING FACILITY  Per UR Regulation:    If discussed at Long Length of Stay Meetings, dates discussed:    Comments:  08/07/13 1255 Arlyss Queen, RN BSN CM

## 2013-08-07 NOTE — Progress Notes (Signed)
Physical Therapy Treatment Patient Details Name: TIMOTHEA BODENHEIMER MRN: [id number] DOB: February 23, 1922 Today's Date: 08/07/2013 Time: 4098-1191 PT Time Calculation (min): 31 min  PT Assessment / Plan / Recommendation  History of Present Illness     PT Comments   Pt is progressing slowly with increased gait distance.  She continues with general weakness and deconditioning.  She plans to go to SNF from here.  Follow Up Recommendations        Does the patient have the potential to tolerate intense rehabilitation     Barriers to Discharge        Equipment Recommendations       Recommendations for Other Services    Frequency     Progress towards PT Goals    Plan Current plan remains appropriate    Precautions / Restrictions     Pertinent Vitals/Pain     Mobility  Bed Mobility Supine to Sit: 3: Mod assist Transfers Sit to Stand: 4: Min assist;With upper extremity assist Stand to Sit: 4: Min assist;With upper extremity assist;To chair/3-in-1 Ambulation/Gait Ambulation/Gait Assistance: 4: Min guard Ambulation Distance (Feet): 40 Feet Assistive device: Rolling walker Gait Pattern: Within Functional Limits;Trunk flexed Gait velocity: WNL General Gait Details: instruction for increased thoracic extension Stairs: No    Exercises General Exercises - Lower Extremity Straight Leg Raises: AROM;Both;10 reps;Supine Other Exercises Other Exercises: bridgein, 10 repetitions   PT Diagnosis:    PT Problem List:   PT Treatment Interventions:     PT Goals (current goals can now be found in the care plan section)    Visit Information  Last PT Received On: 08/07/13    Subjective Data      Cognition       Balance     End of Session PT - End of Session Equipment Utilized During Treatment: Gait belt Activity Tolerance: Patient tolerated treatment well Patient left: in chair;with call bell/phone within reach;with chair alarm set Nurse Communication: Mobility status   GP      Konrad Penta 08/07/2013, 1:26 PM

## 2013-08-07 NOTE — Progress Notes (Signed)
Patient ID: Alicia Mason, female   DOB: [birth year], 79 y.o.   MRN: [id number]  [facility]UR[facility] Helayna Dun A. [name], MD     [email or web address]          Alicia Mason is an 79 y.o. female.   Assessment/Plan: 1. Acute right hemispheric infarct which is suspected of being cardioembolic based upon the MRI findings and new onset atrial fibrillation. The patient has been placed appropriately on anticoagulation. She is being set up to go to rehabilitation.  The patient does not have any complaints today.  She is awake and alert. She is more responsive and appropriate today. She knows she is in the hospital and is on her way to go to rehabilitation. Visual field seems full although there may be some extinction on double simultaneous stimulation on the left side. Facial muscle strength is symmetric pupils are reactive. Left upper extremity: Triceps and biceps 4/5. Left lower extremity shows normal tone, bulk and strength. The right side is normal.    Objective: Vital signs in last 24 hours: Temp:  [97.5 F (36.4 C)-98.9 F (37.2 C)] 97.5 F (36.4 C) (09/18 0748) Pulse Rate:  [63-117] 63 (09/18 0600) Resp:  [13-36] 20 (09/18 0600) BP: (97-189)/(36-138) 142/53 mmHg (09/18 0600) SpO2:  [95 %-99 %] 97 % (09/18 0600) Weight:  [69.3 kg (152 lb 12.5 oz)] 69.3 kg (152 lb 12.5 oz) (09/18 0500)  Intake/Output from previous day: 09/17 0701 - 09/18 0700 In: 1440 [P.O.:840; IV Piggyback:600] Out: 4900 [Urine:4900] Intake/Output this shift: Total I/O In: 300 [P.O.:300] Out: -  Nutritional status: Carb Control   Lab Results: Results for orders placed during the hospital encounter of 08/03/13 (from the past 48 hour(s))  GLUCOSE, CAPILLARY     Status: Abnormal   Collection Time    08/05/13 11:17 AM      Result Value Range   Glucose-Capillary 251 (*) 70 - 99 mg/dL  GLUCOSE, CAPILLARY     Status: Abnormal   Collection Time    08/05/13  4:56 PM      Result Value Range   Glucose-Capillary 197 (*) 70 - 99 mg/dL  GLUCOSE, CAPILLARY     Status: Abnormal   Collection Time    08/05/13  9:18 PM      Result Value Range   Glucose-Capillary 191 (*) 70 - 99 mg/dL   Comment 1 Documented in Chart     Comment 2 Notify RN    GLUCOSE, CAPILLARY     Status: Abnormal   Collection Time    08/06/13  7:24 AM      Result Value Range   Glucose-Capillary 284 (*) 70 - 99 mg/dL   Comment 1 Documented in Chart     Comment 2 Notify RN    BASIC METABOLIC PANEL     Status: Abnormal   Collection Time    08/06/13  8:13 AM      Result Value Range   Sodium 138  135 - 145 mEq/L   Potassium 3.5  3.5 - 5.1 mEq/L   Chloride 101  96 - 112 mEq/L   CO2 32  19 - 32 mEq/L   Glucose, Bld 218 (*) 70 - 99 mg/dL   BUN 7  6 - 23 mg/dL   Creatinine, Ser 4.09  0.50 - 1.10 mg/dL   Calcium 8.1 (*) 8.4 - 10.5 mg/dL   GFR calc non Af Amer 76 (*) >90 mL/min   GFR calc Af Amer 88 (*) >90 mL/min   Comment: (  NOTE)     The eGFR has been calculated using the CKD EPI equation.     This calculation has not been validated in all clinical situations.     eGFR's persistently <90 mL/min signify possible Chronic Kidney     Disease.  MAGNESIUM     Status: None   Collection Time    08/06/13  8:13 AM      Result Value Range   Magnesium 2.2  1.5 - 2.5 mg/dL  GLUCOSE, CAPILLARY     Status: Abnormal   Collection Time    08/06/13 10:48 AM      Result Value Range   Glucose-Capillary 242 (*) 70 - 99 mg/dL   Comment 1 Documented in Chart     Comment 2 Notify RN    GLUCOSE, CAPILLARY     Status: Abnormal   Collection Time    08/06/13  4:38 PM      Result Value Range   Glucose-Capillary 164 (*) 70 - 99 mg/dL  GLUCOSE, CAPILLARY     Status: Abnormal   Collection Time    08/06/13  9:13 PM      Result Value Range   Glucose-Capillary 143 (*) 70 - 99 mg/dL   Comment 1 Documented in Chart     Comment 2 Notify RN    LIPID PANEL     Status: Abnormal   Collection Time    08/07/13  5:37 AM      Result Value  Range   Cholesterol 108  0 - 200 mg/dL   Triglycerides 956  <213 mg/dL   HDL 33 (*) >08 mg/dL   Total CHOL/HDL Ratio 3.3     VLDL 24  0 - 40 mg/dL   LDL Cholesterol 51  0 - 99 mg/dL   Comment:            Total Cholesterol/HDL:CHD Risk     Coronary Heart Disease Risk Table                         Men   Women      1/2 Average Risk   3.4   3.3      Average Risk       5.0   4.4      2 X Average Risk   9.6   7.1      3 X Average Risk  23.4   11.0                Use the calculated Patient Ratio     above and the CHD Risk Table     to determine the patient's CHD Risk.                ATP III CLASSIFICATION (LDL):      <100     mg/dL   Optimal      657-846  mg/dL   Near or Above                        Optimal      130-159  mg/dL   Borderline      962-952  mg/dL   High      >841     mg/dL   Very High  BASIC METABOLIC PANEL     Status: Abnormal   Collection Time    08/07/13  5:37 AM      Result Value Range   Sodium 140  135 - 145 mEq/L   Potassium 3.2 (*) 3.5 - 5.1 mEq/L   Chloride 101  96 - 112 mEq/L   CO2 33 (*) 19 - 32 mEq/L   Glucose, Bld 66 (*) 70 - 99 mg/dL   BUN 5 (*) 6 - 23 mg/dL   Creatinine, Ser 6.21  0.50 - 1.10 mg/dL   Calcium 8.6  8.4 - 30.8 mg/dL   GFR calc non Af Amer 79 (*) >90 mL/min   GFR calc Af Amer >90  >90 mL/min   Comment: (NOTE)     The eGFR has been calculated using the CKD EPI equation.     This calculation has not been validated in all clinical situations.     eGFR's persistently <90 mL/min signify possible Chronic Kidney     Disease.  CBC     Status: Abnormal   Collection Time    08/07/13  5:37 AM      Result Value Range   WBC 8.8  4.0 - 10.5 K/uL   RBC 3.76 (*) 3.87 - 5.11 MIL/uL   Hemoglobin 11.9 (*) 12.0 - 15.0 g/dL   HCT 65.7 (*) 84.6 - 96.2 %   MCV 93.9  78.0 - 100.0 fL   MCH 31.6  26.0 - 34.0 pg   MCHC 33.7  30.0 - 36.0 g/dL   RDW 95.2  84.1 - 32.4 %   Platelets 190  150 - 400 K/uL  GLUCOSE, CAPILLARY     Status: None   Collection  Time    08/07/13  7:42 AM      Result Value Range   Glucose-Capillary 71  70 - 99 mg/dL   Comment 1 Documented in Chart     Comment 2 Notify RN      Lipid Panel  Recent Labs  08/07/13 0537  CHOL 108  TRIG 118  HDL 33*  CHOLHDL 3.3  VLDL 24  LDLCALC 51    Studies/Results: No results found.  Medications:  Scheduled Meds: . antiseptic oral rinse  15 mL Mouth Rinse QID  . atorvastatin  10 mg Oral q1800  . chlorhexidine  15 mL Mouth/Throat BID  . dextrose  0.5 ampule Intravenous NOW  . insulin aspart  0-9 Units Subcutaneous TID WC  . insulin glargine  17 Units Subcutaneous QHS  . meropenem (MERREM) IV  2 g Intravenous Q12H  . metoprolol succinate  50 mg Oral BID  . potassium chloride  30 mEq Oral BID  . rivaroxaban  15 mg Oral QAC breakfast  . sodium chloride  3 mL Intravenous Q12H  . thiamine  100 mg Intravenous Daily  . vancomycin  1,000 mg Intravenous Q12H   Continuous Infusions:  PRN Meds:.acetaminophen, acetaminophen, dextrose, glucose-Vitamin C, ondansetron (ZOFRAN) IV, ondansetron    LOS: 4 days   Aryanah Enslow A. [name], M.D.  Diplomate, Biomedical engineer of Psychiatry and Neurology ( Neurology).

## 2013-08-07 NOTE — Progress Notes (Signed)
Report called to [facility] at [facility].  Patient is ready for transfer to [facility] when her daughter returns with her clothing.  No acute distress noted.  Patient has voided since removal of foley catheter.  Granddaughter at the bedside.

## 2013-08-08 ENCOUNTER — Non-Acute Institutional Stay (SKILLED_NURSING_FACILITY): Payer: Medicare Other | Admitting: Internal Medicine

## 2013-08-08 DIAGNOSIS — I2589 Other forms of chronic ischemic heart disease: Secondary | ICD-10-CM

## 2013-08-08 DIAGNOSIS — E876 Hypokalemia: Secondary | ICD-10-CM

## 2013-08-08 DIAGNOSIS — I4891 Unspecified atrial fibrillation: Secondary | ICD-10-CM

## 2013-08-08 DIAGNOSIS — I255 Ischemic cardiomyopathy: Secondary | ICD-10-CM

## 2013-08-08 DIAGNOSIS — I2510 Atherosclerotic heart disease of native coronary artery without angina pectoris: Secondary | ICD-10-CM

## 2013-08-08 DIAGNOSIS — E119 Type 2 diabetes mellitus without complications: Secondary | ICD-10-CM

## 2013-08-08 DIAGNOSIS — I10 Essential (primary) hypertension: Secondary | ICD-10-CM

## 2013-08-08 DIAGNOSIS — I679 Cerebrovascular disease, unspecified: Secondary | ICD-10-CM

## 2013-08-08 LAB — GLUCOSE, CAPILLARY
Glucose-Capillary: 218 mg/dL — ABNORMAL HIGH (ref 70–99)
Glucose-Capillary: 196 mg/dL — ABNORMAL HIGH (ref 70–99)
Glucose-Capillary: 173 mg/dL — ABNORMAL HIGH (ref 70–99)

## 2013-08-08 LAB — CULTURE, BLOOD (SINGLE): Culture: NO GROWTH

## 2013-08-08 NOTE — Progress Notes (Signed)
Patient ID: Alicia Mason, female   DOB: 01/07/1922, 79 y.o.   MRN: [id number]  This is an acute visit.  Level of care skilled.  Facility [facility].  Chief complaint-acute visit status post hospitalization for CVA.  History of present illness.  Patient is a very pleasant 79 year old female with a history coronary artery disease diabetes and ischemic cardiomyopathy.  She apparently presented to the emergency room with unresponsiveness fever and hypoglycemia.  Apparently her blood sugar was 40 at home it was 90 in the ER.  Regards to the fever with confusion there was concern for meningitis.  Attempted a lumbar puncture were not successful.  CT of the head revealed diffuse cortical atrophy and old lacunar infarcts in the right basal ganglia no acute abnormalities.  She was started on antibiotics and her neurologic status was monitored.  Her blood cultures were negative and the fever did resolve.  No clear reason for the fever although she was recently diagnosed with Klebsiella UTI.  An MRI of her brain was ordered because of the encephalopathy.  It showed acute infarct in the right middle cerebral artery.  She was also found to have atrial fibrillation on EKG.  Because of the atrial fibrillation and CVA anticoagulation was started with [place]--.  Bilateral carotid ultrasound did not reveal any significant ICA stenosis.  2-D echo did show cardiomyopathy with an ejection fraction of 45-50%.  The EEG was a normal study.  Therapy recommended short-term skilled nursing placement for strengthening and rehabilitation and family did agree.  She apparently had some mild left-sided weakness but the acute encephalopathy resolved.  Apparently last evening there was also some confusion but she appears to be more bright and alert this morning very pleasant and interactive.  Regards to her atrial fibrillation this was thought secondary to holding Toprol-XL.  This has been restarted her  heart rate improved again she is on Xeralto.  She did have initial hypotension her ACE inhibitor was held as well as beta blocker then she Toprol was restarted as well as the quinapril at half the original dose.  .  Patient did have an EPS TSH however free T4 was ordered and was within normal range.  She also has some history of hypokalemia in fact on discharge yesterday 3.2 and magnesium was low as well they were replenished she is on potassium supplementation it looks like her magnesium also has normalized.  She is also a type II diabetic with history of hypoglycemia her Lantus and NovoLog were restarted when her mental status improved.  Lantus was  re started at a reduced dose of 10 mg twice a day Previous medical history.  Acute right CVA cardio emboli the room afebrile.  Encephalopathy and mild residual left-sided weakness secondary to CVA-this appears to have significantly improved.  Atrial fibrillation.  Fever thought possibly secondary to UTI.  Coronary artery disease with history of CABG and known ischemic cardiomyopathy-ejection fraction 45-50%.  Dyslipidemia.  Diabetes type 2.  Hypertension.  Normocytic anemia.  Hypokalemia this is being supplemented.  Previous surgical history.  History of CABG hysterectomy as well as cataract surgery.  Social history denies any smoking history or alcohol or illicit drug use.  She did live by herself outside the city of [place] and apparently was pretty independent with her daily activities.  Family history.  History of pneumonia in her mother.  She did have a sister died of an MI at age 55.  Marland Kitchen  Hospital studies.  08/05/2013.  2-D echo-initial ejection  fraction 45-50%.  MRI-did show acute infarction in the right middle cerebral artery territory.  EEG-was within normal limits.  Bilateral carotid ultrasound-no significant ICA stenosis.  Occasions.  Xanax 0.5 mg every 8 hours when necessary.  Lantus 10  units twice a day.  Toprol-XL 50 mg twice a day.  Mirabegron ER 25 mg daily.  Potassium chloride 20 mEq daily.  Pravachol 10 mg daily.  Accupril 20 mg daily.  Xeralto 50 mg daily  Review of systems.  General denies any fever chills she had complained of some nausea earlier but this has resolved.  Skin-does appear to have a significant amount of bruising over the lower legs and lower arms--apparently this is somewhat chronic.  Head ears eyes nose mouth and throat-does not complaining of any visual changes nasal discharge sore throat.  Respiratory-no complaints shortness breath or cough.  Cardiac-fairly extensive history as noted above which he did not find any chest pain or palpitation.  GI-had complained of nausea last night and this morning but this appears to have resolved she says she does have nausea at times-apparently had a episode of vomiting last night no further recurrence-does not complaining of abdominal pain diarrhea or constipation.  GU-recently treated for UTI but denies dysuria.  Muscle skeletal-does not complaining of joint pain does have some weakness status post CVA but this appears to be fairly minimal at this point.  Neurologic-history CVA with some mild left-sided weakness again this appears to be fairly minimal and does not complain of headache or dizziness.  Psych -- does not complaining of anxiety or depression-apparently there is some confusion at times I suspect this is more so at night--she is on Xanax when necessary for anxiety she does not appear to be anxious this afternoon   Physical exam.  Temperature 97.1 pulse 75 respirations 20 blood pressure 102/66.  In general this is a somewhat frail very pleasant elderly female in no distress.  Her skin is warm and dry she does have bruising which appears to be chronic of her lower arms and legs bilaterally she does have some clear dressing over what appears to be some small skin tears on her upper arms  bilaterally no sign of infection at this time.  Eyes does appear to have irregular pupils bilaterally visual acuity does appear intact sclera and conjunctiva are clear extraocular movements intact.  Oropharynx is clear mucous membranes are moist.  Chest is clear to auscultation without rhonchi rales or wheezes.  Heart is regular rate and rhythm without murmur gallop or rub.  Abdomen soft nontender nondistended with positive bowel sounds.  Muscle skeletal actually is able to stand although with assistance-I would say she has minimal left upper and lower extremity weakness.  Grip strength the appears to be intact bilaterally possibly slightly reduced on the left but not much  She did not appear to have significant lower extremity edema.  I do not note any deformities upper or lower extremities.  Neurologic-as stated above her cranial nerves appear to be intact her speech is clear tongue is midline.  She does have some mild left-sided weakness this is quite minimal however.  Psych-she is pleasant and appears alert and oriented --she to give me a fairly decent history where she lives and what her living situation is she did live by herself.  She was oriented to year or a week and date however it was actually on tablet in the room so not sure exactly if she was reading this are actually new.  Labs.  08/07/2013.  Sodium 140 potassium 3.2 BUN 5 creatinine 0.58.  WBC 8.8 hemoglobin 11.9 platelets 190.  T4-1 0.22.  TSH earlier in the week was 0.276.  Cholesterol 108-triglycerides 118-HDL 33-LDL 51.  08/04/2013.  Liver function tests within normal limits except albumin of 2.8.  Assessment and plan.  #1-CVA-she appears to have a minimal residual fact per exam today-she does continue on Xeralto-this was thought to be cardioembolic she does have a history of atrial fibrillation this appears to be rate controlled Toprol has been restarted her blood pressures appear to be stable  her vital signs today continue to monitor.--She is here for therapy-belly she had lived alone again this will have to be re- assessed as she nears her discharge date  #2-fever encephalopathy-this appears to be essentially resolved again unclear etiology nonetheless clinically this appears to be doing all right.  #3 has history of atrial fibrillation is on anticoagulation as noted above and also  on Toprol this appears to be rate controlled.  History of coronary artery disease ischemic cardiomyopathy-at this point she appears to be relatively asymptomatic her Toprol and ACE inhibitor have been restarted at lower doses.  #5-history of hypertension--. recent hypotension-Will continue to monitor again she is on beta blocker and ACE inhibitor.  #6-history of hypokalemia she is on supplementation per chart review she has some history of this in the hospital-Will update a metabolic panel tomorrow.  #7-diabetes type 2-she is on Lantus and Glucophage--minimal CBGs  So far--- but appear to run in the mid 100s to low 200s so far 137 this morning   #8 as hyperlipidemia-recent lipid panel appeared fairly satisfactory she is on a statin.--Liver function tests also appeared to be relatively unremarkable  Number  9-apparently some history of overactive bladder-she does continue on Mybetriq  #10-history of anxiety-he does have Xanax when necessary.  #11-nausea-this appears resolved will have to monitor may need a when necessary medication if this is persistent    [id number]-of note greater than 45 minutes spent assessing patient-and discussing her status with nursing staff as well patient-and coordinating and formulating a plan of care.  Of note greater than 50% of time spent coordinating plan of care        .

## 2013-08-09 LAB — GLUCOSE, CAPILLARY
Glucose-Capillary: 92 mg/dL (ref 70–99)
Glucose-Capillary: 123 mg/dL — ABNORMAL HIGH (ref 70–99)

## 2013-08-10 LAB — GLUCOSE, CAPILLARY
Glucose-Capillary: 83 mg/dL (ref 70–99)
Glucose-Capillary: 151 mg/dL — ABNORMAL HIGH (ref 70–99)

## 2013-08-11 LAB — GLUCOSE, CAPILLARY
Glucose-Capillary: 146 mg/dL — ABNORMAL HIGH (ref 70–99)
Glucose-Capillary: 176 mg/dL — ABNORMAL HIGH (ref 70–99)

## 2013-08-12 LAB — GLUCOSE, CAPILLARY
Glucose-Capillary: 264 mg/dL — ABNORMAL HIGH (ref 70–99)
Glucose-Capillary: 182 mg/dL — ABNORMAL HIGH (ref 70–99)

## 2013-08-13 ENCOUNTER — Non-Acute Institutional Stay (SKILLED_NURSING_FACILITY): Payer: Medicare Other | Admitting: Internal Medicine

## 2013-08-13 DIAGNOSIS — I4891 Unspecified atrial fibrillation: Secondary | ICD-10-CM

## 2013-08-13 DIAGNOSIS — I69959 Hemiplegia and hemiparesis following unspecified cerebrovascular disease affecting unspecified side: Secondary | ICD-10-CM

## 2013-08-13 DIAGNOSIS — I2589 Other forms of chronic ischemic heart disease: Secondary | ICD-10-CM

## 2013-08-13 DIAGNOSIS — I10 Essential (primary) hypertension: Secondary | ICD-10-CM

## 2013-08-13 DIAGNOSIS — I255 Ischemic cardiomyopathy: Secondary | ICD-10-CM

## 2013-08-13 LAB — GLUCOSE, CAPILLARY
Glucose-Capillary: 122 mg/dL — ABNORMAL HIGH (ref 70–99)
Glucose-Capillary: 243 mg/dL — ABNORMAL HIGH (ref 70–99)
Glucose-Capillary: 301 mg/dL — ABNORMAL HIGH (ref 70–99)
Glucose-Capillary: 219 mg/dL — ABNORMAL HIGH (ref 70–99)

## 2013-08-13 NOTE — Progress Notes (Signed)
Patient ID: Alicia Mason, female   DOB: December 12, 1921, 79 y.o.   MRN: [id number]  Facility; [facility] Chief complaint; admission to SNF post admit to [facility] from September 14 to September 18  History; this is a 79 year old woman who lives independently outside if [name]. She initially presented to the emergency room with a report of unresponsiveness fever and low blood sugar. She had recently been hospitalized for treatment of UTI and was discharged on Cipro. In the emergency department her temperature was 103.1. Her blood glucose had been reported to be 40 at home. In the hospital there was initially some concern for meningitis and LP was attempted but could not be done. She received 5 days of IV antibiotics during this hospitalization and several days of antibiotics prior to hospital admission. No additional culture information was obtained, the source of this fever was not clear. She was also found to have an acute right middle cerebral artery stroke. An MRI of the brain revealed areas of acute infarction in the right middle cerebral artery territory. She was found to have a call fibrillation on her EKG with a remote history of same. She was seen by neurology. A 2-D echo revealed a known cardiomyopathy with an ejection fraction of 45-50%. EEG revealed a normal study. Bilateral carotid ultrasounds revealed no significant ICA stenosis. She was started on xarelto for stroke prophylaxis. She was placed on Toprol-XL to control her heart rate to per in terms of her known coronary artery disease and ischemic cardiomyopathy, her quinapril was initially held due to low blood pressures and subsequently was put back at half the original dose of. There may be room to further increase this to the full dose that she was on previously.  Past Medical History  Diagnosis Date  . Essential hypertension, benign   . Type 2 diabetes mellitus   . Coronary atherosclerosis of native coronary artery     Multivessel  .  Closed fracture of right humerus     Managed conservatively 2011  . Cerebrovascular disease   . Seasonal allergies   . Arthritis   . Vertigo   . Ischemic cardiomyopathy     45-50% 07/2013/ EF 40% per Echo 06/2011.  Marland Kitchen Near syncope   . NSTEMI (non-ST elevated myocardial infarction)     06/2011.  . Diabetes mellitus   . Acute ischemic stroke 08/04/2013    Right MCA stroke  . Atrial fibrillation 05/28/2009    Paroxysmal    . Dyslipidemia 08/07/2013   Past Surgical History  Procedure Laterality Date  . Coronary artery bypass graft  1999    LIMA to LAD, SVG to OM1 and OM2, SVG to PDA  . Cataract surgery    . Abdominal hysterectomy      WITH BSO.   Medications Xanax 0.5 every 8 hours when necessary, Lantus 10 units 2 times daily, Toprolol XL 50 by mouth 2 times a day, Myrbetriq 25 daily, K-Dur 20 daily, Pravachol 10 daily, Accupril 20 daily, xarelto 15 daily,  Social history; patient lives alone and was previously independent. She has both a cane and walker at home but I am unclear whether she was really using them. The Independence was verified by family members were present  reports that she has never smoked. She has never used smokeless tobacco. She reports that she does not drink alcohol or use illicit drugs.  family history includes Arthritis in her other; CAD in her sister; Cancer in her other; Coronary artery disease in her other;  Diabetes in her other; Pneumonia in her mother.  Review of systems Respiratory; no clear shortness of breath Cardiac no exertional chest pain Neurologic; she really doesn't relate any current lateralizing complaints. States she is already able to stand and walk  Physical examination; GEN; remarkably well looking woman Vitals: Temperature 97.9 pulse 63 respirations 19 blood pressure 161/78 rechecked at 148/76 HEENT oral exam is normal Respiratory clear entry bilaterally Cardiac heart sounds fairly regular no gallops no murmurs and no carotid  bruits Abdomen somewhat distended no liver or spleen no tenderness GU bladder is not distended. Neurologic; cranial nerves are all intact; perhaps a mild pronator drift bilaterally. Lower extremity strength seems quite normal. Reflexes symmetric both toes downgoing Gait; she is able to bring herself to a sitting and then a standing position however her balance is poor. This will definitely need some work up. I suspect she may need a walker. Orthostatic blood pressures should probably be checked Mental status; I see no major issues here  Impression/plan 1 right middle cerebral artery stroke felt to be cardioembolic from paroxysmal atrial fibrillation. I see very little residual of this. She is made a good recovery #2 paroxysmal atrial fibrillation was felt to put her on twice a day Toprolol XL. Nevertheless she appears to be tolerating this is a. Not now on xarelto for stroke prophylaxis. Her heartrate is controlled #3 type 2 diabetes on insulin. She presented with hypoglycemia. She was giving herself her own insulin prior to admission. Not completely sure why she is felt to need twice a day Lantus. I think this could probably be changed to once a day #4 fever on presentation she had a recent Klebsiella UTI. The fever that she had on this presentation resolved. Not currently on any antibiotics. #5 hypokalemia discharge with a potassium of 3.2 this will need to be rechecked . #6 hyperlipidemia on Pravachol #7 hypertension her blood pressure today is 148/76. There is apparently no room to move on the Accupril however I think further monitoring is in order. #8 significant gait ataxia. She should have her orthostatic blood pressures checked. I have encouraged her to stay in the facility until she has the full benefit of physical and occupational therapy but

## 2013-08-15 LAB — GLUCOSE, CAPILLARY
Glucose-Capillary: 181 mg/dL — ABNORMAL HIGH (ref 70–99)
Glucose-Capillary: 130 mg/dL — ABNORMAL HIGH (ref 70–99)
Glucose-Capillary: 350 mg/dL — ABNORMAL HIGH (ref 70–99)

## 2013-08-16 LAB — GLUCOSE, CAPILLARY
Glucose-Capillary: 170 mg/dL — ABNORMAL HIGH (ref 70–99)
Glucose-Capillary: 120 mg/dL — ABNORMAL HIGH (ref 70–99)

## 2013-08-17 LAB — GLUCOSE, CAPILLARY
Glucose-Capillary: 113 mg/dL — ABNORMAL HIGH (ref 70–99)
Glucose-Capillary: 233 mg/dL — ABNORMAL HIGH (ref 70–99)
Glucose-Capillary: 253 mg/dL — ABNORMAL HIGH (ref 70–99)

## 2013-08-18 LAB — GLUCOSE, CAPILLARY
Glucose-Capillary: 201 mg/dL — ABNORMAL HIGH (ref 70–99)
Glucose-Capillary: 125 mg/dL — ABNORMAL HIGH (ref 70–99)

## 2013-08-19 LAB — GLUCOSE, CAPILLARY
Glucose-Capillary: 281 mg/dL — ABNORMAL HIGH (ref 70–99)
Glucose-Capillary: 151 mg/dL — ABNORMAL HIGH (ref 70–99)
Glucose-Capillary: 209 mg/dL — ABNORMAL HIGH (ref 70–99)

## 2013-08-20 LAB — GLUCOSE, CAPILLARY
Glucose-Capillary: 125 mg/dL — ABNORMAL HIGH (ref 70–99)
Glucose-Capillary: 267 mg/dL — ABNORMAL HIGH (ref 70–99)

## 2013-08-21 LAB — GLUCOSE, CAPILLARY
Glucose-Capillary: 227 mg/dL — ABNORMAL HIGH (ref 70–99)
Glucose-Capillary: 276 mg/dL — ABNORMAL HIGH (ref 70–99)
Glucose-Capillary: 234 mg/dL — ABNORMAL HIGH (ref 70–99)

## 2013-08-22 LAB — GLUCOSE, CAPILLARY
Glucose-Capillary: 236 mg/dL — ABNORMAL HIGH (ref 70–99)
Glucose-Capillary: 219 mg/dL — ABNORMAL HIGH (ref 70–99)
Glucose-Capillary: 230 mg/dL — ABNORMAL HIGH (ref 70–99)

## 2013-08-23 LAB — GLUCOSE, CAPILLARY
Glucose-Capillary: 177 mg/dL — ABNORMAL HIGH (ref 70–99)
Glucose-Capillary: 216 mg/dL — ABNORMAL HIGH (ref 70–99)

## 2013-08-24 LAB — GLUCOSE, CAPILLARY
Glucose-Capillary: 132 mg/dL — ABNORMAL HIGH (ref 70–99)
Glucose-Capillary: 185 mg/dL — ABNORMAL HIGH (ref 70–99)
Glucose-Capillary: 145 mg/dL — ABNORMAL HIGH (ref 70–99)

## 2013-08-25 LAB — GLUCOSE, CAPILLARY
Glucose-Capillary: 133 mg/dL — ABNORMAL HIGH (ref 70–99)
Glucose-Capillary: 104 mg/dL — ABNORMAL HIGH (ref 70–99)
Glucose-Capillary: 154 mg/dL — ABNORMAL HIGH (ref 70–99)

## 2013-08-26 LAB — GLUCOSE, CAPILLARY
Glucose-Capillary: 93 mg/dL (ref 70–99)
Glucose-Capillary: 153 mg/dL — ABNORMAL HIGH (ref 70–99)

## 2013-08-27 LAB — GLUCOSE, CAPILLARY
Glucose-Capillary: 208 mg/dL — ABNORMAL HIGH (ref 70–99)
Glucose-Capillary: 154 mg/dL — ABNORMAL HIGH (ref 70–99)
Glucose-Capillary: 143 mg/dL — ABNORMAL HIGH (ref 70–99)

## 2013-08-28 LAB — GLUCOSE, CAPILLARY
Glucose-Capillary: 108 mg/dL — ABNORMAL HIGH (ref 70–99)
Glucose-Capillary: 210 mg/dL — ABNORMAL HIGH (ref 70–99)
Glucose-Capillary: 104 mg/dL — ABNORMAL HIGH (ref 70–99)

## 2013-08-29 LAB — GLUCOSE, CAPILLARY
Glucose-Capillary: 176 mg/dL — ABNORMAL HIGH (ref 70–99)
Glucose-Capillary: 128 mg/dL — ABNORMAL HIGH (ref 70–99)

## 2013-08-30 LAB — GLUCOSE, CAPILLARY
Glucose-Capillary: 199 mg/dL — ABNORMAL HIGH (ref 70–99)
Glucose-Capillary: 169 mg/dL — ABNORMAL HIGH (ref 70–99)
Glucose-Capillary: 223 mg/dL — ABNORMAL HIGH (ref 70–99)

## 2013-08-31 LAB — GLUCOSE, CAPILLARY
Glucose-Capillary: 138 mg/dL — ABNORMAL HIGH (ref 70–99)
Glucose-Capillary: 143 mg/dL — ABNORMAL HIGH (ref 70–99)
Glucose-Capillary: 258 mg/dL — ABNORMAL HIGH (ref 70–99)

## 2013-09-01 LAB — GLUCOSE, CAPILLARY
Glucose-Capillary: 89 mg/dL (ref 70–99)
Glucose-Capillary: 119 mg/dL — ABNORMAL HIGH (ref 70–99)

## 2013-09-02 LAB — GLUCOSE, CAPILLARY
Glucose-Capillary: 101 mg/dL — ABNORMAL HIGH (ref 70–99)
Glucose-Capillary: 187 mg/dL — ABNORMAL HIGH (ref 70–99)
Glucose-Capillary: 182 mg/dL — ABNORMAL HIGH (ref 70–99)

## 2013-09-03 LAB — GLUCOSE, CAPILLARY
Glucose-Capillary: 124 mg/dL — ABNORMAL HIGH (ref 70–99)
Glucose-Capillary: 182 mg/dL — ABNORMAL HIGH (ref 70–99)

## 2013-09-04 LAB — GLUCOSE, CAPILLARY
Glucose-Capillary: 372 mg/dL — ABNORMAL HIGH (ref 70–99)
Glucose-Capillary: 179 mg/dL — ABNORMAL HIGH (ref 70–99)
Glucose-Capillary: 199 mg/dL — ABNORMAL HIGH (ref 70–99)

## 2013-09-05 LAB — GLUCOSE, CAPILLARY
Glucose-Capillary: 125 mg/dL — ABNORMAL HIGH (ref 70–99)
Glucose-Capillary: 334 mg/dL — ABNORMAL HIGH (ref 70–99)
Glucose-Capillary: 324 mg/dL — ABNORMAL HIGH (ref 70–99)
Glucose-Capillary: 240 mg/dL — ABNORMAL HIGH (ref 70–99)
Glucose-Capillary: 162 mg/dL — ABNORMAL HIGH (ref 70–99)

## 2013-09-06 LAB — GLUCOSE, CAPILLARY
Glucose-Capillary: 156 mg/dL — ABNORMAL HIGH (ref 70–99)
Glucose-Capillary: 249 mg/dL — ABNORMAL HIGH (ref 70–99)
Glucose-Capillary: 197 mg/dL — ABNORMAL HIGH (ref 70–99)

## 2013-09-07 LAB — GLUCOSE, CAPILLARY
Glucose-Capillary: 138 mg/dL — ABNORMAL HIGH (ref 70–99)
Glucose-Capillary: 177 mg/dL — ABNORMAL HIGH (ref 70–99)

## 2013-09-08 LAB — GLUCOSE, CAPILLARY
Glucose-Capillary: 127 mg/dL — ABNORMAL HIGH (ref 70–99)
Glucose-Capillary: 184 mg/dL — ABNORMAL HIGH (ref 70–99)
Glucose-Capillary: 255 mg/dL — ABNORMAL HIGH (ref 70–99)
Glucose-Capillary: 164 mg/dL — ABNORMAL HIGH (ref 70–99)

## 2013-09-09 ENCOUNTER — Non-Acute Institutional Stay (SKILLED_NURSING_FACILITY): Payer: Medicare Other | Admitting: Internal Medicine

## 2013-09-09 DIAGNOSIS — I10 Essential (primary) hypertension: Secondary | ICD-10-CM

## 2013-09-09 DIAGNOSIS — N390 Urinary tract infection, site not specified: Secondary | ICD-10-CM

## 2013-09-09 DIAGNOSIS — I2510 Atherosclerotic heart disease of native coronary artery without angina pectoris: Secondary | ICD-10-CM

## 2013-09-09 DIAGNOSIS — I679 Cerebrovascular disease, unspecified: Secondary | ICD-10-CM

## 2013-09-09 DIAGNOSIS — E785 Hyperlipidemia, unspecified: Secondary | ICD-10-CM

## 2013-09-09 DIAGNOSIS — I4891 Unspecified atrial fibrillation: Secondary | ICD-10-CM

## 2013-09-09 LAB — GLUCOSE, CAPILLARY
Glucose-Capillary: 140 mg/dL — ABNORMAL HIGH (ref 70–99)
Glucose-Capillary: 92 mg/dL (ref 70–99)

## 2013-09-09 NOTE — Progress Notes (Signed)
Patient ID: Alicia Mason, female   DOB: 11/01/1922, 79 y.o.   MRN: [id number] This is an acute visit.  Level of care skilled.  Facility [facility].   Chief complaint-medical management of atrial fibrillation-CVA-coronary artery disease-hypertension-diabetes type 2-hyperlipidemia-hypokalemia--acute visit secondary UTI.    History of present illness.  Patient is a very pleasant 79 year old female with a history coronary artery disease diabetes and ischemic cardiomyopathy.  She apparently presented to the emergency room with unresponsiveness fever and hypoglycemia.  Apparently her blood sugar was 40 at home it was 90 in the ER.  Regards to the fever with confusion there was concern for meningitis.  Attempted a lumbar puncture were not successful.  CT of the head revealed diffuse cortical atrophy and old lacunar infarcts in the right basal ganglia no acute abnormalities.  She was started on antibiotics and her neurologic status was monitored.  Her blood cultures were negative and the fever did resolve.  No clear reason for the fever although she was recently diagnosed with Klebsiella UTI.  An MRI of her brain was ordered because of the encephalopathy.  It showed acute infarct in the right middle cerebral artery.  She was also found to have atrial fibrillation on EKG.  Because of the atrial fibrillation and CVA anticoagulation was started with [place]amp--.  Bilateral carotid ultrasound did not reveal any significant ICA stenosis.  2-D echo did show cardiomyopathy with an ejection fraction of 45-50%.  The EEG was a normal study.  Therapy recommended short-term skilled nursing placement for strengthening and rehabilitation and family did agree.--Orlan Leavens he is doing fairly well  She apparently had some mild left-sided weakness but the acute encephalopathy resolved--and this weakness now appears to be fairly minimal.   She apparently had increased confusion over the weekend a urine was done which has grown  out greater than 100,000 colonies of Escherichia coli this is pan sensitive-  Regards to her atrial fibrillation this was thought secondary to holding Toprol-XL.  This has been restarted her heart rate improved again she is on Xeralto.  She did have initial hypotension her ACE inhibitor was held as well as beta blocker then she Toprol was restarted as well as the quinapril at half the original dose.  .  Patient did have an EPS TSH however free T4 was ordered and was within normal range.  She also has some history of hypokalemia --most recent lab on September 22 showed this is normalized at 3.8 she is on supplementation  .  She is also a type II diabetic with history of hypoglycemia her Lantus and NovoLog were restarted when her mental status improved.  Lantus is currently 18 units a day  Also on   NovoLog 2 units before breakfast 4 units before lunch and dinner   Previous medical history .  Acute right CVA cardio emboli the room afebrile.  Encephalopathy and mild residual left-sided weakness secondary to CVA-this appears to have significantly improved.  Atrial fibrillation.  Fever thought possibly secondary to UTI.  Coronary artery disease with history of CABG and known ischemic cardiomyopathy-ejection fraction 45-50%.  Dyslipidemia.  Diabetes type 2.  Hypertension.  Normocytic anemia.  Hypokalemia this is being supplemented.  Previous surgical history.  History of CABG hysterectomy as well as cataract surgery.   Social history denies any smoking history or alcohol or illicit drug use.  She did live by herself outside the city of [place] and apparently was pretty independent with her daily activities.   Family history.  History of  pneumonia in her mother.  She did have a sister died of an MI at age 10.  Marland Kitchen  Hospital studies.  08/05/2013.  2-D echo-initial ejection fraction 45-50%.  MRI-did show acute infarction in the right middle cerebral artery territory.  EEG-was within  normal limits.  Bilateral carotid ultrasound-no significant ICA stenosis.   Medications have been reviewed per [facility]  Review of systems    General denies any fever chills.  Skin- She does have some history of chronic bruising-also a blister on her right heel and left great toe this is being treated with skin prep.  Head ears eyes nose mouth and throat-does not complaining of any visual changes nasal discharge sore throat.  Respiratory-no complaints shortness breath or cough.  Cardiac-fairly extensive history as noted above which he did not find any chest pain or palpitation.  GI-had complained of nausea last night and this morning but this appears to have resolved she says she does have nausea at times-apparently had a episode of vomiting last night no further recurrence-does not complaining of abdominal pain diarrhea or constipation.  GU-recently treated for UTI but denies dysuria.  Muscle skeletal-does not complaining of joint pain does have some weakness status post CVA but this appears to be fairly minimal at this point.  Neurologic-history CVA with some mild left-sided weakness again this appears to be fairly minimal and does not complain of headache or dizziness.  Psych -- does not complaining of anxiety or depression-apparently there is some confusion at times I suspect this is more so at night--she is on Xanax when necessary for anxiety she does not appear to be anxious this afternoon   Physical exam.  Temperature is 98.6 pulse 64 respirations 20 blood pressure 159/76 130/76 in this range.  In general this is a pleasant elderly female in no distress lying comfortably in bed.  Her skin is warm and dry she does have some chronic bruising of her extremities she also has a blister on her right heel this does not appear to be infected appears to be fluid filled there is no surrounding erythema or drainage.  Also smaller area likewise on her left great toe and this does not appear to be  infected.  Her eyes she has prescription lenses --visual acuity appears intact.  Oropharynx clear mucous membranes moist.  I   Chest is clear to auscultation without rhonchi rales or wheezes.  Heart is regular rate and rhythm without murmur gallop or rub.  Abdomen soft nontender nondistended with positive bowel sounds GU-appears to have some suprapubic tenderness-there is no discharge or drainage from the vaginal area.  Muscle skeletal --is able to move all extremities I do not note any deformities I do not really see any focal deficits there.  Grip strength the appears to be intact bilaterally   She did not appear to have significant lower extremity edema. .  Neurologic-as stated above her cranial nerves appear to be intact her speech is clear tongue is midline.Marland Kitchen  Psych-she is pleasant and and conversant-she is oriented to self and to  Day of week however stated the month was November she does know  the president.     Labs.  08/11/2013.  Sodium 136 potassium 3.8 BUN 13 creatinine 0.69   08/07/2013.  Sodium 140 potassium 3.2 BUN 5 creatinine 0.58.  WBC 8.8 hemoglobin 11.9 platelets 190.  T4-1 0.22.  TSH e was 0.276.  Cholesterol 108-triglycerides 118-HDL 33-LDL 51.  08/04/2013.  Liver function tests within normal limits except albumin  of 2.8 .  Assessment and plan.  #1-CVA-she appears to have a minimal residual fact per exam today-she does continue on Xeralto-this was thought to be cardioembolic she does have a history of atrial fibrillation this appears to be rate controlled Toprol has been restarted her blood pressures appear to be somewhat variable--we'll update CBC.  she had lived alone again this will have to be re- assessed as she nears her discharge date  #2- UTI-she does apparently have some increased confusion at times also suprapubic tenderness-Will start Cipro 250 twice a day for 7 days and probiotic twice a day for 7 days t.  #3 has history of atrial fibrillation is  on anticoagulation as noted above and also on Toprol this appears to be rate controlled.  History of coronary artery disease ischemic cardiomyopathy-at this point she appears to be relatively asymptomatic her Toprol and ACE inhibitor have.  #5-history of hypertension--. rWill continue to monitor again she is on beta blocker and ACE inhibitor.  #6-history of hypokalemia she is on supplementation per chart review she has some history of this in the hospital-Will update a metabolic panel  #7-diabetes type 2-she is on Lantus and Novolog before meals--she has somewhat variable sugars appear she runs from 89 to high 100s generally in the morning-high 100s to low 200s at noon --  81-to low 200s at 4 PM and at at bedtime averaging I would say in the mid 100s with again quite a bit of variability..--will check a hemoglobin A1c   #8  hyperlipidemia-recent lipid panel appeared fairly satisfactory she is on a statin.--Liver function tests also appeared to be relatively unremarkable  Number 9-apparently some history of overactive bladder-she does continue on Mybetriq  #10-history of anxiety-he does have Xanax when necessary  [id number].

## 2013-09-10 LAB — GLUCOSE, CAPILLARY
Glucose-Capillary: 243 mg/dL — ABNORMAL HIGH (ref 70–99)
Glucose-Capillary: 107 mg/dL — ABNORMAL HIGH (ref 70–99)
Glucose-Capillary: 110 mg/dL — ABNORMAL HIGH (ref 70–99)

## 2013-09-11 LAB — GLUCOSE, CAPILLARY
Glucose-Capillary: 235 mg/dL — ABNORMAL HIGH (ref 70–99)
Glucose-Capillary: 165 mg/dL — ABNORMAL HIGH (ref 70–99)
Glucose-Capillary: 179 mg/dL — ABNORMAL HIGH (ref 70–99)

## 2013-09-12 ENCOUNTER — Ambulatory Visit: Payer: Medicare Other | Admitting: Urology

## 2013-09-12 LAB — GLUCOSE, CAPILLARY
Glucose-Capillary: 256 mg/dL — ABNORMAL HIGH (ref 70–99)
Glucose-Capillary: 112 mg/dL — ABNORMAL HIGH (ref 70–99)
Glucose-Capillary: 215 mg/dL — ABNORMAL HIGH (ref 70–99)

## 2013-09-13 LAB — GLUCOSE, CAPILLARY
Glucose-Capillary: 137 mg/dL — ABNORMAL HIGH (ref 70–99)
Glucose-Capillary: 316 mg/dL — ABNORMAL HIGH (ref 70–99)
Glucose-Capillary: 235 mg/dL — ABNORMAL HIGH (ref 70–99)
Glucose-Capillary: 175 mg/dL — ABNORMAL HIGH (ref 70–99)

## 2013-09-14 LAB — GLUCOSE, CAPILLARY
Glucose-Capillary: 129 mg/dL — ABNORMAL HIGH (ref 70–99)
Glucose-Capillary: 165 mg/dL — ABNORMAL HIGH (ref 70–99)

## 2013-09-15 LAB — GLUCOSE, CAPILLARY
Glucose-Capillary: 128 mg/dL — ABNORMAL HIGH (ref 70–99)
Glucose-Capillary: 297 mg/dL — ABNORMAL HIGH (ref 70–99)
Glucose-Capillary: 200 mg/dL — ABNORMAL HIGH (ref 70–99)
Glucose-Capillary: 164 mg/dL — ABNORMAL HIGH (ref 70–99)

## 2013-09-16 LAB — GLUCOSE, CAPILLARY
Glucose-Capillary: 99 mg/dL (ref 70–99)
Glucose-Capillary: 190 mg/dL — ABNORMAL HIGH (ref 70–99)

## 2013-09-17 LAB — GLUCOSE, CAPILLARY
Glucose-Capillary: 89 mg/dL (ref 70–99)
Glucose-Capillary: 238 mg/dL — ABNORMAL HIGH (ref 70–99)
Glucose-Capillary: 235 mg/dL — ABNORMAL HIGH (ref 70–99)

## 2013-09-18 LAB — GLUCOSE, CAPILLARY
Glucose-Capillary: 146 mg/dL — ABNORMAL HIGH (ref 70–99)
Glucose-Capillary: 215 mg/dL — ABNORMAL HIGH (ref 70–99)
Glucose-Capillary: 227 mg/dL — ABNORMAL HIGH (ref 70–99)

## 2013-09-19 LAB — GLUCOSE, CAPILLARY

## 2013-09-20 LAB — GLUCOSE, CAPILLARY
Glucose-Capillary: 129 mg/dL — ABNORMAL HIGH (ref 70–99)
Glucose-Capillary: 387 mg/dL — ABNORMAL HIGH (ref 70–99)

## 2013-09-21 LAB — GLUCOSE, CAPILLARY
Glucose-Capillary: 132 mg/dL — ABNORMAL HIGH (ref 70–99)
Glucose-Capillary: 215 mg/dL — ABNORMAL HIGH (ref 70–99)
Glucose-Capillary: 144 mg/dL — ABNORMAL HIGH (ref 70–99)
Glucose-Capillary: 183 mg/dL — ABNORMAL HIGH (ref 70–99)

## 2013-09-22 LAB — GLUCOSE, CAPILLARY
Glucose-Capillary: 112 mg/dL — ABNORMAL HIGH (ref 70–99)
Glucose-Capillary: 174 mg/dL — ABNORMAL HIGH (ref 70–99)
Glucose-Capillary: 79 mg/dL (ref 70–99)

## 2013-09-23 ENCOUNTER — Non-Acute Institutional Stay (SKILLED_NURSING_FACILITY): Payer: Medicare Other | Admitting: Internal Medicine

## 2013-09-23 DIAGNOSIS — E876 Hypokalemia: Secondary | ICD-10-CM

## 2013-09-23 DIAGNOSIS — L259 Unspecified contact dermatitis, unspecified cause: Secondary | ICD-10-CM

## 2013-09-23 DIAGNOSIS — E119 Type 2 diabetes mellitus without complications: Secondary | ICD-10-CM

## 2013-09-23 DIAGNOSIS — F419 Anxiety disorder, unspecified: Secondary | ICD-10-CM

## 2013-09-23 DIAGNOSIS — F411 Generalized anxiety disorder: Secondary | ICD-10-CM

## 2013-09-23 DIAGNOSIS — L309 Dermatitis, unspecified: Secondary | ICD-10-CM

## 2013-09-23 LAB — GLUCOSE, CAPILLARY
Glucose-Capillary: 99 mg/dL (ref 70–99)
Glucose-Capillary: 156 mg/dL — ABNORMAL HIGH (ref 70–99)
Glucose-Capillary: 184 mg/dL — ABNORMAL HIGH (ref 70–99)
Glucose-Capillary: 143 mg/dL — ABNORMAL HIGH (ref 70–99)

## 2013-09-23 NOTE — Progress Notes (Signed)
Patient ID: Alicia Mason, female   DOB: 1922/02/09, 79 y.o.   MRN: [id number]  This is an acute visit.  Level of care skilled.  Facility [facility].  Chief complaint S. acute visit secondary to left toe issues followup UTI and anxiety.  History of present illness.  Patient is a pleasant 79 year old female here for rehabilitation after increased confusion at home she was found to have an acute right middle cerebral artery CVA however she has minimal residual deficits-also thought to have a UTI this was treated originally with Klebsiella.  Subsequently here she did develop a UTI as well- positive for Escherichia coli she has finished a course of ciprofloxacin-she is not complaining of any dysuria tonight she has been afebrile.  Her nurse tonight did want me to look at her left great toe apparently there is some erythema here I am following up on this.  She also has a history of anxiety she is followed by psychiatric services here she does have Xanax 0.5 mg every 8 hours when necessary apparently requires this quite often at night  She also had hypoglycemia before hospital visit-this is all possibly caused by the infection in her other issues-blood sugars here been fairly stable it appears although there is some fluctuation ranging from 83-low 100s in the a.m.--123-low 200s at noon--128-350--at 4 PM--125-218 at at bedtime.--She is on Lantus 18 units daily as well as NovoLog 2 units before breakfast 4 units before lunch and dinner.  She also has a history of hypokalemia she is on potassium supplementation last metabolic I see back on Sept 22 potassium was normal at 3.8 we will need to update this   Family medical social history has been reviewed per previous progress note on 09/09/2013  Medications have been reviewed per [facility]  Review of systems.  General denies any fever or chills she appears to be somewhat anxious according to nursing staff this happens frequently at night.  Head ears eyes  nose mouth and throat-does not complaining of any sore throat or visual changes.  Skin-concerns about her left great toe she appears anxious about this although does not complaining of pain.  Respiratory no complaints of shortness of breath or cough.  Cardiac does not complaining of chest pain.  GI no complaints of abdominal pain nausea or vomiting.  Neurologic history CVA with minimal deficits does not complaining of headache or dizziness.  Psych-does have some history of anxiety as noted above.  Physical exam.  Temperature is 98.0 pulse 69 respirations 20 blood pressure 110/54.  In general this a frail elderly female in no distress although somewhat anxious gnash lying comfortably in bed.  Her skin is warm and dry. She has a midline surgical scar-I did assess the left great toe there is some erythema at the distal aspect but this is not warm to touch or acutely tender-- appears to have a small blood blister on the distal portion here I do not really see any sign of infection here . Chest is clear to auscultation with no labored breathing.  Heart is regular rate and rhythm without murmur gallop or rub.  Abdomen is soft nontender with active bowel sounds.  Muscle skeletal most all her extremities at baseline it appears although limited exam since she is in bed-I do not see any lower extremity edema.  Neurologic appears grossly intact speech is clear.  Psych appears to be anxious a little bit confused which apparently she gets more so at night.  Labs.  08/11/2013.  Sodium  136 potassium 3.8 BUN 13 creatinine 0.69.  08/05/2013.  Sodium 132 potassium 3.3 BUN 11 creatinine 0.66.  WBC 8.6 hemoglobin 10.3 platelets 150.  Assessment and plan.  #1-question left total cellulitis--dermatitis-I do not see any indication of this I did speak with nursing apparently they are applying Skin-Prep will have followup on this by the wound care nurse tomorrow but I do not see anything  acutely concerning here.  #2-diabetes type 2-she has variable blood sugars apparently hypoglycemia before her admission per review of previous progress notes-will check a hemoglobin A1c to give [place] a little better insight-but I do not see any really hypoglycemic readings recently.  #3-history of hypokalemia --somewhat low potassium in  September she is on potassium supplementation we will need to update a metabolic panel.  #4-anxiety-this may be contributing to her concerns tonight I suspect---- she does have when necessary Xanax and apparently this helped significantly-- if this becomes more of a concern certainly will need to be seen by psychiatric services expediently.  [id number] .    \

## 2013-09-24 LAB — GLUCOSE, CAPILLARY: Glucose-Capillary: 80 mg/dL (ref 70–99)

## 2013-09-25 LAB — GLUCOSE, CAPILLARY
Glucose-Capillary: 107 mg/dL — ABNORMAL HIGH (ref 70–99)
Glucose-Capillary: 110 mg/dL — ABNORMAL HIGH (ref 70–99)
Glucose-Capillary: 60 mg/dL — ABNORMAL LOW (ref 70–99)
Glucose-Capillary: 304 mg/dL — ABNORMAL HIGH (ref 70–99)

## 2013-09-26 LAB — GLUCOSE, CAPILLARY
Glucose-Capillary: 160 mg/dL — ABNORMAL HIGH (ref 70–99)
Glucose-Capillary: 172 mg/dL — ABNORMAL HIGH (ref 70–99)

## 2013-09-27 DIAGNOSIS — F419 Anxiety disorder, unspecified: Secondary | ICD-10-CM | POA: Insufficient documentation

## 2013-09-27 DIAGNOSIS — E876 Hypokalemia: Secondary | ICD-10-CM | POA: Insufficient documentation

## 2013-09-27 DIAGNOSIS — L309 Dermatitis, unspecified: Secondary | ICD-10-CM | POA: Insufficient documentation

## 2013-09-27 LAB — GLUCOSE, CAPILLARY
Glucose-Capillary: 138 mg/dL — ABNORMAL HIGH (ref 70–99)
Glucose-Capillary: 158 mg/dL — ABNORMAL HIGH (ref 70–99)
Glucose-Capillary: 228 mg/dL — ABNORMAL HIGH (ref 70–99)
Glucose-Capillary: 167 mg/dL — ABNORMAL HIGH (ref 70–99)

## 2013-09-28 LAB — GLUCOSE, CAPILLARY
Glucose-Capillary: 131 mg/dL — ABNORMAL HIGH (ref 70–99)
Glucose-Capillary: 221 mg/dL — ABNORMAL HIGH (ref 70–99)
Glucose-Capillary: 213 mg/dL — ABNORMAL HIGH (ref 70–99)

## 2013-09-29 LAB — GLUCOSE, CAPILLARY
Glucose-Capillary: 120 mg/dL — ABNORMAL HIGH (ref 70–99)
Glucose-Capillary: 319 mg/dL — ABNORMAL HIGH (ref 70–99)
Glucose-Capillary: 146 mg/dL — ABNORMAL HIGH (ref 70–99)
Glucose-Capillary: 172 mg/dL — ABNORMAL HIGH (ref 70–99)

## 2013-09-30 LAB — GLUCOSE, CAPILLARY
Glucose-Capillary: 231 mg/dL — ABNORMAL HIGH (ref 70–99)
Glucose-Capillary: 230 mg/dL — ABNORMAL HIGH (ref 70–99)
Glucose-Capillary: 197 mg/dL — ABNORMAL HIGH (ref 70–99)

## 2013-10-01 LAB — GLUCOSE, CAPILLARY
Glucose-Capillary: 98 mg/dL (ref 70–99)
Glucose-Capillary: 335 mg/dL — ABNORMAL HIGH (ref 70–99)
Glucose-Capillary: 229 mg/dL — ABNORMAL HIGH (ref 70–99)

## 2013-10-02 LAB — GLUCOSE, CAPILLARY: Glucose-Capillary: 124 mg/dL — ABNORMAL HIGH (ref 70–99)

## 2013-10-03 LAB — GLUCOSE, CAPILLARY
Glucose-Capillary: 150 mg/dL — ABNORMAL HIGH (ref 70–99)
Glucose-Capillary: 251 mg/dL — ABNORMAL HIGH (ref 70–99)

## 2013-10-04 LAB — GLUCOSE, CAPILLARY
Glucose-Capillary: 269 mg/dL — ABNORMAL HIGH (ref 70–99)
Glucose-Capillary: 203 mg/dL — ABNORMAL HIGH (ref 70–99)
Glucose-Capillary: 247 mg/dL — ABNORMAL HIGH (ref 70–99)

## 2013-10-05 LAB — GLUCOSE, CAPILLARY
Glucose-Capillary: 122 mg/dL — ABNORMAL HIGH (ref 70–99)
Glucose-Capillary: 120 mg/dL — ABNORMAL HIGH (ref 70–99)

## 2013-10-06 LAB — GLUCOSE, CAPILLARY: Glucose-Capillary: 222 mg/dL — ABNORMAL HIGH (ref 70–99)

## 2013-10-07 LAB — GLUCOSE, CAPILLARY
Glucose-Capillary: 103 mg/dL — ABNORMAL HIGH (ref 70–99)
Glucose-Capillary: 178 mg/dL — ABNORMAL HIGH (ref 70–99)
Glucose-Capillary: 108 mg/dL — ABNORMAL HIGH (ref 70–99)
Glucose-Capillary: 257 mg/dL — ABNORMAL HIGH (ref 70–99)
Glucose-Capillary: 104 mg/dL — ABNORMAL HIGH (ref 70–99)
Glucose-Capillary: 149 mg/dL — ABNORMAL HIGH (ref 70–99)

## 2013-10-08 LAB — GLUCOSE, CAPILLARY: Glucose-Capillary: 73 mg/dL (ref 70–99)

## 2013-10-09 LAB — GLUCOSE, CAPILLARY
Glucose-Capillary: 236 mg/dL — ABNORMAL HIGH (ref 70–99)
Glucose-Capillary: 141 mg/dL — ABNORMAL HIGH (ref 70–99)
Glucose-Capillary: 295 mg/dL — ABNORMAL HIGH (ref 70–99)
Glucose-Capillary: 189 mg/dL — ABNORMAL HIGH (ref 70–99)

## 2013-10-10 LAB — GLUCOSE, CAPILLARY
Glucose-Capillary: 118 mg/dL — ABNORMAL HIGH (ref 70–99)
Glucose-Capillary: 172 mg/dL — ABNORMAL HIGH (ref 70–99)
Glucose-Capillary: 246 mg/dL — ABNORMAL HIGH (ref 70–99)

## 2013-10-11 LAB — GLUCOSE, CAPILLARY
Glucose-Capillary: 150 mg/dL — ABNORMAL HIGH (ref 70–99)
Glucose-Capillary: 120 mg/dL — ABNORMAL HIGH (ref 70–99)

## 2013-10-12 LAB — GLUCOSE, CAPILLARY: Glucose-Capillary: 157 mg/dL — ABNORMAL HIGH (ref 70–99)

## 2013-10-13 LAB — GLUCOSE, CAPILLARY
Glucose-Capillary: 137 mg/dL — ABNORMAL HIGH (ref 70–99)
Glucose-Capillary: 293 mg/dL — ABNORMAL HIGH (ref 70–99)
Glucose-Capillary: 127 mg/dL — ABNORMAL HIGH (ref 70–99)
Glucose-Capillary: 136 mg/dL — ABNORMAL HIGH (ref 70–99)

## 2013-10-14 LAB — GLUCOSE, CAPILLARY
Glucose-Capillary: 96 mg/dL (ref 70–99)
Glucose-Capillary: 139 mg/dL — ABNORMAL HIGH (ref 70–99)

## 2013-10-15 LAB — GLUCOSE, CAPILLARY
Glucose-Capillary: 112 mg/dL — ABNORMAL HIGH (ref 70–99)
Glucose-Capillary: 161 mg/dL — ABNORMAL HIGH (ref 70–99)
Glucose-Capillary: 152 mg/dL — ABNORMAL HIGH (ref 70–99)
Glucose-Capillary: 141 mg/dL — ABNORMAL HIGH (ref 70–99)

## 2013-10-16 LAB — GLUCOSE, CAPILLARY
Glucose-Capillary: 98 mg/dL (ref 70–99)
Glucose-Capillary: 195 mg/dL — ABNORMAL HIGH (ref 70–99)
Glucose-Capillary: 176 mg/dL — ABNORMAL HIGH (ref 70–99)
Glucose-Capillary: 137 mg/dL — ABNORMAL HIGH (ref 70–99)

## 2013-10-17 LAB — GLUCOSE, CAPILLARY: Glucose-Capillary: 162 mg/dL — ABNORMAL HIGH (ref 70–99)

## 2013-10-18 LAB — GLUCOSE, CAPILLARY
Glucose-Capillary: 97 mg/dL (ref 70–99)
Glucose-Capillary: 130 mg/dL — ABNORMAL HIGH (ref 70–99)
Glucose-Capillary: 118 mg/dL — ABNORMAL HIGH (ref 70–99)

## 2013-10-19 LAB — GLUCOSE, CAPILLARY
Glucose-Capillary: 92 mg/dL (ref 70–99)
Glucose-Capillary: 170 mg/dL — ABNORMAL HIGH (ref 70–99)
Glucose-Capillary: 97 mg/dL (ref 70–99)

## 2013-10-20 LAB — GLUCOSE, CAPILLARY
Glucose-Capillary: 238 mg/dL — ABNORMAL HIGH (ref 70–99)
Glucose-Capillary: 229 mg/dL — ABNORMAL HIGH (ref 70–99)

## 2013-10-21 ENCOUNTER — Non-Acute Institutional Stay (SKILLED_NURSING_FACILITY): Payer: Medicare Other | Admitting: Internal Medicine

## 2013-10-21 DIAGNOSIS — E876 Hypokalemia: Secondary | ICD-10-CM

## 2013-10-21 DIAGNOSIS — F419 Anxiety disorder, unspecified: Secondary | ICD-10-CM

## 2013-10-21 DIAGNOSIS — I679 Cerebrovascular disease, unspecified: Secondary | ICD-10-CM

## 2013-10-21 DIAGNOSIS — I2510 Atherosclerotic heart disease of native coronary artery without angina pectoris: Secondary | ICD-10-CM

## 2013-10-21 DIAGNOSIS — I10 Essential (primary) hypertension: Secondary | ICD-10-CM

## 2013-10-21 DIAGNOSIS — D649 Anemia, unspecified: Secondary | ICD-10-CM

## 2013-10-21 DIAGNOSIS — F411 Generalized anxiety disorder: Secondary | ICD-10-CM

## 2013-10-21 DIAGNOSIS — E119 Type 2 diabetes mellitus without complications: Secondary | ICD-10-CM

## 2013-10-21 DIAGNOSIS — I4891 Unspecified atrial fibrillation: Secondary | ICD-10-CM

## 2013-10-21 DIAGNOSIS — E785 Hyperlipidemia, unspecified: Secondary | ICD-10-CM

## 2013-10-21 LAB — GLUCOSE, CAPILLARY
Glucose-Capillary: 110 mg/dL — ABNORMAL HIGH (ref 70–99)
Glucose-Capillary: 164 mg/dL — ABNORMAL HIGH (ref 70–99)
Glucose-Capillary: 76 mg/dL (ref 70–99)
Glucose-Capillary: 207 mg/dL — ABNORMAL HIGH (ref 70–99)

## 2013-10-21 NOTE — Progress Notes (Signed)
Patient ID: Alicia Mason, female   DOB: 12-Sep-1922, 79 y.o.   MRN: [id number] This is a discharge note.  Level of care skilled.  Facility [facility].    Chief complaint--discharge note .  History of present illness.  Patient is a very pleasant 79 year old female with a history coronary artery disease diabetes and ischemic cardiomyopathy.  She apparently presented to the emergency room with unresponsiveness fever and hypoglycemia.  Apparently her blood sugar was 40 at home it was 90 in the ER.  Regards to the fever with confusion there was concern for meningitis.  Attempted a lumbar puncture were not successful.  CT of the head revealed diffuse cortical atrophy and old lacunar infarcts in the right basal ganglia no acute abnormalities.  She was started on antibiotics and her neurologic status was monitored.  Her blood cultures were negative and the fever did resolve.  No clear reason for the fever although she was earlier diagnosed with Klebsiella UTI.  An MRI of her brain was ordered because of the encephalopathy.  It showed acute infarct in the right middle cerebral artery.  She was also found to have atrial fibrillation on EKG.  Because of the atrial fibrillation and CVA anticoagulation was started with [place]--.  Bilateral carotid ultrasound did not reveal any significant ICA stenosis.  2-D echo did show cardiomyopathy with an ejection fraction of 45-50%.  The EEG was a normal study.  Therapy recommended short-term skilled nursing placement for strengthening and rehabilitation and family did agree.-she has done fairly well here-actually most recent acute issue was some anxiety-she is on when necessary Xanax but apparently is not really taking this any more which is encouraging  She apparently had some mild left-sided weakness but the acute encephalopathy resolved--and this weakness now appears to be fairly minimal.    Regards to her atrial fibrillation this was thought secondary to holding  Toprol-XL.  This has been restarted her heart rate improved again she is on Xeralto.  She did have initial hypotension her ACE inhibitor was held as well as beta blocker then the Toprol was restarted as well as the quinapril at half the original dose--her blood pressures appear to be stable most recently noted to be 120/68-124/60-135/75-her pulses appear to run largely in the 60s.  .  Patient did have an elevated TSH however free T4 was ordered and was within normal range.  She also has some history of hypokalemia --most recent lab on Nov. 5 showed this is normalized at 4.1 she is on supplementation  .  She is also a type II diabetic with history of hypoglycemia her Lantus and NovoLog were restarted when her mental status improved.  Lantus is currently 18 units a day Also on NovoLog 2 units before breakfast 4 units before lunch and dinner--CBGs appear to be largely stable ranging from the 90s-mid 100s a.m. more so mid 100s at noon and at 4 PM although there is some variability in the 90s to 200s at times--- at bedtime readings appear to be in the mid 100s generally as well  Hemoglobin A1c on November 5 was 6.4  Patient is very motivated to go home she will have 24-hour care-she will need a walker as well as home health PT OT and nursing support-she does continue on Xeralto for anticoagulation  Previous medical history  .  Acute right CVA cardio emboli the room afebrile.  Encephalopathy and mild residual left-sided weakness secondary to CVA-this appears to have significantly improved.  Atrial fibrillation.  Fever  thought possibly secondary to UTI.  Coronary artery disease with history of CABG and known ischemic cardiomyopathy-ejection fraction 45-50%.  Dyslipidemia.  Diabetes type 2.  Hypertension.  Normocytic anemia.  Hypokalemia this is being supplemented.  Previous surgical history.  History of CABG hysterectomy as well as cataract surgery.  Social history denies any smoking history or  alcohol or illicit drug use.  She did live by herself outside the city of [place] and apparently was pretty independent with her daily activities.  Family history.  History of pneumonia in her mother.  She did have a sister died of an MI at age 50.  Marland Kitchen  Hospital studies.  08/05/2013.  2-D echo-initial ejection fraction 45-50%.  MRI-did show acute infarction in the right middle cerebral artery territory.  EEG-was within normal limits.  Bilateral carotid ultrasound-no significant ICA stenosis.  Medications have been reviewed per [facility] [place]   Review of systems  General denies any fever chills.  Skin-  She does have some history of chronic bruising-also left great toe appeared to be somewhat reddened this is not infected per assessment today.  Head ears eyes nose mouth and throat-does not complaining of any visual changes nasal discharge sore throat.  Respiratory-no complaints shortness breath or cough.  Cardiac-fairly extensive history as noted above which he did not find any chest pain or palpitation.  GI- --Does not complain of any abdominal pain nausea vomiting diarrhea constipation although there apparently had been nausea earlier in her stay.  GU-recently treated for UTI but denies dysuria.  Muscle skeletal-does not complaining of joint pain does have some weakness status post CVA but this appears to be fairly minimal at this point.  Neurologic-history CVA with some mild left-sided weakness again this appears to be fairly minimal and does not complain of headache or dizziness.  Psych -- does not complaining of anxiety or depression-appears to be in good spirits she is not really using her Xanax anymore   Physical exam.  Temperature 97.6 pulse 68 respirations 20 blood pressure 120/68 weight is 135.4 this is relatively stable  In general this is a pleasant elderly female in no distress sitting comfortably in her wheelchair  Her skin is warm and dry she does have some chronic bruising of her  extremities she also has some mild erythema of her right great toe this is cool to touch nonerythematous no drainage nontender .  Her eyes she has prescription lenses --visual acuity appears intact.  Oropharynx clear mucous membranes moist.  I  Chest is clear to auscultation without rhonchi rales or wheezes.  Heart is regular rate and rhythm without murmur gallop or rub.  Abdomen soft nontender nondistended with positive bowel sounds   .  Muscle skeletal --is able to move all extremities I do not note any deformities I do not really see any focal deficits there--she is ambulating with a walker.  Grip strength the appears to be intact bilaterally  She did not appear to have significant lower extremity edema. .  Neurologic-as stated above her cranial nerves appear to be intact her speech is clear tongue is midline.Marland Kitchen  Psych-she is pleasant and and conversant- .   Labs 10/13/2013.  WBC 7.4 hemoglobin 10.2 platelets 216.  Cholesterol 100 triglycerides 94 LDL 50 HDL 31.  09/24/2013.  Hemoglobin A1c-6.4.  09/24/2013.  WBC 6.0 hemoglobin 11.6 platelets 197.  Sodium 139 potassium 4.1 BUN 21 creatinine 0.9.  Marland Kitchen  08/11/2013.  Sodium 136 potassium 3.8 BUN 13 creatinine 0.69  08/07/2013.  Sodium 140 potassium  3.2 BUN 5 creatinine 0.58.  WBC 8.8 hemoglobin 11.9 platelets 190.  T4-1 0.22.  TSH e was 0.276.  Cholesterol 108-triglycerides 118-HDL 33-LDL 51.  08/04/2013.  Liver function tests within normal limits except albumin of 2.8  .  Assessment and plan.  #1-CVA-she appears to have a minimal residual fact per exam today-she does continue on Xeralto-this was thought to be cardioembolic she does have a history of atrial fibrillation this appears to be rate controlled Toprol has been restarted--blood pressures appear stable.    .  #2 has history of atrial fibrillation is on anticoagulation as noted above and also on Toprol this appears to be rate controlled  #3.  History of  coronary artery disease ischemic cardiomyopathy-at this point she appears to be relatively asymptomatic her Toprol and ACE inhibitor havebeen restarted.  #4-history of hypertension--. rthis appears stable   she is on beta blocker and ACE inhibitor.  #5-history of hypokalemia she is on supplementation per chart review she has some history of this in the hospital-Will update a metabolic panel  #6-diabetes type 2-she is on Lantus and Novolog before meals--at this point appears to be stable hemoglobin A1c was satisfactory I do not see any consistently low or high blood sugars --   c  #7 hyperlipidemia-recent lipid panel appeared fairly satisfactory she is on a statin.--Liver function tests also appeared to be relatively unremarkable  Number 8apparently some history of overactive bladder-she does continue on Mybetriq  #9-history of anxiety-she is not using her Xanax anymore #10-anemia-I do note per lab done last week her hemoglobin dropped a bit from the 11's  to the tens I -we'll update this before discharge-may warrent follow up by primary care provider   Concerning discharge-apparently she had lived alone previously but now will have 24-hour care-again will need continued therapy as well as a walker her medical issues appear to be relatively stable will need continued followup by primary care provider  [phone] note greater than 30 minutes spent preparing this discharge summary   .

## 2013-10-22 LAB — GLUCOSE, CAPILLARY: Glucose-Capillary: 203 mg/dL — ABNORMAL HIGH (ref 70–99)

## 2013-11-07 ENCOUNTER — Other Ambulatory Visit (HOSPITAL_BASED_OUTPATIENT_CLINIC_OR_DEPARTMENT_OTHER): Payer: Self-pay | Admitting: Internal Medicine

## 2013-11-16 ENCOUNTER — Other Ambulatory Visit (HOSPITAL_BASED_OUTPATIENT_CLINIC_OR_DEPARTMENT_OTHER): Payer: Self-pay | Admitting: Internal Medicine

## 2013-11-18 ENCOUNTER — Other Ambulatory Visit (HOSPITAL_BASED_OUTPATIENT_CLINIC_OR_DEPARTMENT_OTHER): Payer: Self-pay | Admitting: Internal Medicine

## 2014-03-26 ENCOUNTER — Encounter (HOSPITAL_COMMUNITY): Payer: Self-pay | Admitting: Emergency Medicine

## 2014-03-26 ENCOUNTER — Emergency Department (HOSPITAL_COMMUNITY)
Admission: EM | Admit: 2014-03-26 | Discharge: 2014-03-26 | Disposition: A | Discharge status: Home / Self Care | Payer: Medicare Other | Attending: Emergency Medicine | Admitting: Emergency Medicine

## 2014-03-26 ENCOUNTER — Emergency Department (HOSPITAL_COMMUNITY): Payer: Medicare Other

## 2014-03-26 DIAGNOSIS — E119 Type 2 diabetes mellitus without complications: Secondary | ICD-10-CM | POA: Insufficient documentation

## 2014-03-26 DIAGNOSIS — I10 Essential (primary) hypertension: Secondary | ICD-10-CM | POA: Insufficient documentation

## 2014-03-26 DIAGNOSIS — S79919A Unspecified injury of unspecified hip, initial encounter: Secondary | ICD-10-CM | POA: Insufficient documentation

## 2014-03-26 DIAGNOSIS — Z87828 Personal history of other (healed) physical injury and trauma: Secondary | ICD-10-CM | POA: Insufficient documentation

## 2014-03-26 DIAGNOSIS — Z7901 Long term (current) use of anticoagulants: Secondary | ICD-10-CM | POA: Insufficient documentation

## 2014-03-26 DIAGNOSIS — E785 Hyperlipidemia, unspecified: Secondary | ICD-10-CM | POA: Insufficient documentation

## 2014-03-26 DIAGNOSIS — Z8739 Personal history of other diseases of the musculoskeletal system and connective tissue: Secondary | ICD-10-CM | POA: Insufficient documentation

## 2014-03-26 DIAGNOSIS — Z9109 Other allergy status, other than to drugs and biological substances: Secondary | ICD-10-CM | POA: Insufficient documentation

## 2014-03-26 DIAGNOSIS — W19XXXA Unspecified fall, initial encounter: Secondary | ICD-10-CM

## 2014-03-26 DIAGNOSIS — I4891 Unspecified atrial fibrillation: Secondary | ICD-10-CM | POA: Insufficient documentation

## 2014-03-26 DIAGNOSIS — Y9389 Activity, other specified: Secondary | ICD-10-CM | POA: Insufficient documentation

## 2014-03-26 DIAGNOSIS — R4789 Other speech disturbances: Secondary | ICD-10-CM | POA: Insufficient documentation

## 2014-03-26 DIAGNOSIS — R5381 Other malaise: Secondary | ICD-10-CM | POA: Insufficient documentation

## 2014-03-26 DIAGNOSIS — I252 Old myocardial infarction: Secondary | ICD-10-CM | POA: Insufficient documentation

## 2014-03-26 DIAGNOSIS — R5383 Other fatigue: Secondary | ICD-10-CM

## 2014-03-26 DIAGNOSIS — Z794 Long term (current) use of insulin: Secondary | ICD-10-CM | POA: Insufficient documentation

## 2014-03-26 DIAGNOSIS — E860 Dehydration: Secondary | ICD-10-CM | POA: Insufficient documentation

## 2014-03-26 DIAGNOSIS — Y9269 Other specified industrial and construction area as the place of occurrence of the external cause: Secondary | ICD-10-CM | POA: Insufficient documentation

## 2014-03-26 DIAGNOSIS — W010XXA Fall on same level from slipping, tripping and stumbling without subsequent striking against object, initial encounter: Secondary | ICD-10-CM | POA: Insufficient documentation

## 2014-03-26 DIAGNOSIS — Z951 Presence of aortocoronary bypass graft: Secondary | ICD-10-CM | POA: Insufficient documentation

## 2014-03-26 DIAGNOSIS — Z8673 Personal history of transient ischemic attack (TIA), and cerebral infarction without residual deficits: Secondary | ICD-10-CM | POA: Insufficient documentation

## 2014-03-26 DIAGNOSIS — Z79899 Other long term (current) drug therapy: Secondary | ICD-10-CM | POA: Insufficient documentation

## 2014-03-26 DIAGNOSIS — S79929A Unspecified injury of unspecified thigh, initial encounter: Principal | ICD-10-CM

## 2014-03-26 DIAGNOSIS — I2510 Atherosclerotic heart disease of native coronary artery without angina pectoris: Secondary | ICD-10-CM | POA: Insufficient documentation

## 2014-03-26 LAB — CBC WITH DIFFERENTIAL/PLATELET
Basophils Absolute: 0 10*3/uL (ref 0.0–0.1)
Basophils Relative: 0 % (ref 0–1)
Eosinophils Absolute: 0 10*3/uL (ref 0.0–0.7)
Eosinophils Relative: 0 % (ref 0–5)
HCT: 31.1 % — ABNORMAL LOW (ref 36.0–46.0)
Hemoglobin: 11 g/dL — ABNORMAL LOW (ref 12.0–15.0)
LYMPHS PCT: 12 % (ref 12–46)
Lymphs Abs: 1.1 10*3/uL (ref 0.7–4.0)
MCH: 31.9 pg (ref 26.0–34.0)
MCHC: 35.4 g/dL (ref 30.0–36.0)
MCV: 90.1 fL (ref 78.0–100.0)
Monocytes Absolute: 0.4 10*3/uL (ref 0.1–1.0)
Monocytes Relative: 5 % (ref 3–12)
Neutro Abs: 7.1 10*3/uL (ref 1.7–7.7)
Neutrophils Relative %: 83 % — ABNORMAL HIGH (ref 43–77)
PLATELETS: 171 10*3/uL (ref 150–400)
RBC: 3.45 MIL/uL — ABNORMAL LOW (ref 3.87–5.11)
RDW: 13.7 % (ref 11.5–15.5)
WBC: 8.6 10*3/uL (ref 4.0–10.5)

## 2014-03-26 LAB — COMPREHENSIVE METABOLIC PANEL
ALT: 11 U/L (ref 0–35)
AST: 20 U/L (ref 0–37)
Albumin: 3.4 g/dL — ABNORMAL LOW (ref 3.5–5.2)
Alkaline Phosphatase: 68 U/L (ref 39–117)
BILIRUBIN TOTAL: 0.5 mg/dL (ref 0.3–1.2)
BUN: 22 mg/dL (ref 6–23)
CALCIUM: 8.8 mg/dL (ref 8.4–10.5)
CHLORIDE: 104 meq/L (ref 96–112)
CO2: 23 mEq/L (ref 19–32)
Creatinine, Ser: 1.27 mg/dL — ABNORMAL HIGH (ref 0.50–1.10)
GFR calc Af Amer: 42 mL/min — ABNORMAL LOW (ref >90)
GFR, EST NON AFRICAN AMERICAN: 36 mL/min — AB (ref >90)
Glucose, Bld: 254 mg/dL — ABNORMAL HIGH (ref 70–99)
Potassium: 4.2 mEq/L (ref 3.7–5.3)
SODIUM: 139 meq/L (ref 137–147)
Total Protein: 6.3 g/dL (ref 6.0–8.3)

## 2014-03-26 LAB — URINE MICROSCOPIC-ADD ON

## 2014-03-26 LAB — CK: Total CK: 283 U/L — ABNORMAL HIGH (ref 7–177)

## 2014-03-26 LAB — URINALYSIS, ROUTINE W REFLEX MICROSCOPIC
Bilirubin Urine: NEGATIVE
GLUCOSE, UA: 500 mg/dL — AB
Ketones, ur: NEGATIVE mg/dL
Nitrite: NEGATIVE
PH: 6 (ref 5.0–8.0)
Protein, ur: NEGATIVE mg/dL
Specific Gravity, Urine: 1.025 (ref 1.005–1.030)
Urobilinogen, UA: 0.2 mg/dL (ref 0.0–1.0)

## 2014-03-26 MED ORDER — SODIUM CHLORIDE 0.9 % IV SOLN
Freq: Once | INTRAVENOUS | Status: AC
  Administered 2014-03-26: 1000 mL via INTRAVENOUS

## 2014-03-26 NOTE — ED Notes (Signed)
Pt states that she tripped and fell while working in her yard this afternoon. Pt reports that she was unable to get up by herself and was down for 3 hours until passers by found her and called EMS. Pt's had hypotension en route (80/50). Her reported baseline is 150/80.  EMS gave 500cc NS. Pt denies LOC. Pt is alert and orient x 4. EMS reported skin tear on patient's left forearm. No other injuries noted.

## 2014-03-26 NOTE — ED Provider Notes (Signed)
CSN: [id number]     Arrival date & time 03/26/14  1552 History  This chart was scribed for Carmin Muskrat, MD by Rolanda Lundborg, ED Scribe. This patient was seen in room APA14/APA14 and the patient's care was started at 4:03 PM.    Chief Complaint  Patient presents with  . Fall   The history is provided by the patient. No language interpreter was used.   HPI Comments: Alicia Mason is a 80 y.o. female who presents to the Emergency Department complaining of an un-witnessed fall this morning. Pt reports she was unable to get up for 3 hours until found by passersby who called EMS.   Pt reports hypotension en route by EMS of 90/50. States her baseline is 150/80. EMS gave 500cc NS. The patient is a very poor historian, but it seems as though she had a mechanical fall.  She has been upright, though not ambulatory such as at baseline. She denies head trauma, subsequent confusion, disorientation, nausea, vomiting, incontinence. She states that she always has discomfort in her hips, though she seems to have greater discomfort in her right hip. She also complains of pain in the left forearm.    Past Medical History  Diagnosis Date  . Essential hypertension, benign   . Type 2 diabetes mellitus   . Coronary atherosclerosis of native coronary artery     Multivessel  . Closed fracture of right humerus     Managed conservatively 2011  . Cerebrovascular disease   . Seasonal allergies   . Arthritis   . Vertigo   . Ischemic cardiomyopathy     45-50% 07/2013/ EF 40% per Echo 06/2011.  Marland Kitchen Near syncope   . NSTEMI (non-ST elevated myocardial infarction)     06/2011.  . Diabetes mellitus   . Acute ischemic stroke 08/04/2013    Right MCA stroke  . Atrial fibrillation 05/28/2009    Paroxysmal    . Dyslipidemia 08/07/2013   Past Surgical History  Procedure Laterality Date  . Coronary artery bypass graft  1999    LIMA to LAD, SVG to OM1 and OM2, SVG to PDA  . Cataract surgery    . Abdominal  hysterectomy      WITH BSO.   Family History  Problem Relation Age of Onset  . CAD Sister     Died with MI age 29  . Pneumonia Mother   . Arthritis Other   . Coronary artery disease Other   . Diabetes Other   . Cancer Other    History  Substance Use Topics  . Smoking status: Never Smoker   . Smokeless tobacco: Never Used  . Alcohol Use: No   OB History   Grav Para Term Preterm Abortions TAB SAB Ect Mult Living                 Review of Systems  Constitutional:       Per HPI, otherwise negative  HENT:       Per HPI, otherwise negative  Respiratory:       Per HPI, otherwise negative  Cardiovascular:       Per HPI, otherwise negative  Gastrointestinal: Negative for vomiting.  Endocrine:       Negative aside from HPI  Genitourinary:       Neg aside from HPI   Musculoskeletal:       Per HPI, otherwise negative  Skin: Negative.   Neurological: Positive for weakness. Negative for syncope.      Allergies  Cephalexin  Home Medications   Prior to Admission medications   Medication Sig Start Date End Date Taking? Authorizing Provider  ALPRAZolam Duanne Moron) 0.5 MG tablet Take one tablet by mouth every 8 hours as needed for anxiety 08/07/13   Tiffany L Reed, DO  insulin glargine (LANTUS) 100 UNIT/ML injection Inject 0.1 mLs (10 Units total) into the skin 2 (two) times daily. 08/07/13   Rexene Alberts, MD  metoprolol succinate (TOPROL-XL) 50 MG 24 hr tablet Take 1 tablet (50 mg total) by mouth 2 (two) times daily. Take with or immediately following a meal. 08/07/13   Rexene Alberts, MD  mirabegron ER (MYRBETRIQ) 25 MG TB24 tablet Take 1 tablet (25 mg total) by mouth daily. 08/07/13   Rexene Alberts, MD  potassium chloride (K-DUR,KLOR-CON) 20 MEQ tablet Take 1 tablet (20 mEq total) by mouth daily. 08/07/13   Rexene Alberts, MD  pravastatin (PRAVACHOL) 10 MG tablet Take 10 mg by mouth daily.      Historical Provider, MD  quinapril (ACCUPRIL) 20 MG tablet Take 1 tablet (20 mg total) by  mouth daily. 08/07/13   Rexene Alberts, MD  Rivaroxaban (XARELTO) 15 MG TABS tablet Take 1 tablet (15 mg total) by mouth daily before breakfast. 08/07/13   Rexene Alberts, MD   BP 85/43  Pulse 66  Temp(Src) 97.5 F (36.4 C) (Oral)  Resp 19  SpO2 99% Physical Exam  Nursing note and vitals reviewed. Constitutional: She is oriented to person, place, and time. She appears well-developed and well-nourished. No distress.  HENT:  Head: Normocephalic and atraumatic.  Eyes: Conjunctivae and EOM are normal.  Cardiovascular: Normal rate and regular rhythm.   Pulmonary/Chest: Effort normal and breath sounds normal. No stridor. No respiratory distress.  Abdominal: She exhibits no distension.  Musculoskeletal: She exhibits no edema.       Right hip: She exhibits decreased strength, tenderness and bony tenderness.       Left hip: Normal.       Legs: Neurological: She is alert and oriented to person, place, and time. No cranial nerve deficit.  Skin: Skin is warm and dry.     Psychiatric: She has a normal mood and affect. Her behavior is normal. Her speech is tangential. Cognition and memory are impaired.    ED Course  Procedures (including critical care time) Medications  0.9 %  sodium chloride infusion (not administered)    DIAGNOSTIC STUDIES: Oxygen Saturation is 99% on [place], normal by my interpretation.    COORDINATION OF CARE: 4:17 PM- Discussed treatment plan with pt. Pt agrees to plan.    Labs Review Labs Reviewed  CBC WITH DIFFERENTIAL - Abnormal; Notable for the following:    RBC 3.45 (*)    Hemoglobin 11.0 (*)    HCT 31.1 (*)    Neutrophils Relative % 83 (*)    All other components within normal limits  COMPREHENSIVE METABOLIC PANEL - Abnormal; Notable for the following:    Glucose, Bld 254 (*)    Creatinine, Ser 1.27 (*)    Albumin 3.4 (*)    GFR calc non Af Amer 36 (*)    GFR calc Af Amer 42 (*)    All other components within normal limits  CK - Abnormal; Notable for  the following:    Total CK 283 (*)    All other components within normal limits  URINALYSIS, ROUTINE W REFLEX MICROSCOPIC - Abnormal; Notable for the following:    Glucose, UA 500 (*)    Hgb urine dipstick  TRACE (*)    Leukocytes, UA SMALL (*)    All other components within normal limits  URINE MICROSCOPIC-ADD ON - Abnormal; Notable for the following:    Squamous Epithelial / LPF MANY (*)    Bacteria, UA FEW (*)    All other components within normal limits    Imaging Review Dg Forearm Left  03/26/2014   CLINICAL DATA:  Left forearm pain secondary to a fall today.  EXAM: LEFT FOREARM - 2 VIEW  COMPARISON:  None.  FINDINGS: There is no fracture or dislocation. There are degenerative changes in the carpal bones and at the first carpal metacarpal joint. Slight degenerative changes at the elbow joint.  IMPRESSION: No acute osseous abnormality.   Electronically Signed   By: Rozetta Nunnery M.D.   On: 03/26/2014 17:09   Dg Hip Complete Right  03/26/2014   CLINICAL DATA:  Fall with right hip pain.  EXAM: RIGHT HIP - COMPLETE 2+ VIEW  COMPARISON:  None.  FINDINGS: The bones are osteopenic in appearance. There are mild degenerative changes of the right hip. The right hip is located. The proximal right femur and bony pelvis are intact. No acute fracture is identified. Degenerative changes of the lower lumbar spine. Extensive atherosclerotic vascular calcifications of the femoral arteries noted. Calcified phleboliths bilaterally.  IMPRESSION: Osteopenia. No acute fracture identified. If there is continued clinical concern for fracture, consider further evaluation with pelvic MRI (preferred if possible) or pelvic CT.   Electronically Signed   By: Curlene Dolphin M.D.   On: 03/26/2014 17:12    Following fluid resuscitation the patient's blood pressure improved from 80/40 to 110/60.  6:43 PM Patient feeling better.  With e/o dehydration IVF will continue and she will be reassessed.  8:50 PM Patient sitting  upright, no complaints.  She is eating and drinking.   MDM   This patient presents after a fall, and time spent on the ground.  When patient does have a skin tear, but her evaluation is otherwise largely reassuring.  Patient had mild hypotension on arrival, but is improved with fluids, and 50 presentation of dehydration.  With her improvement, the absence of any ongoing complaints, patient was discharged to follow up with primary care.  Carmin Muskrat, MD 03/26/14 2051

## 2014-03-26 NOTE — ED Notes (Signed)
Pt alert & oriented x4. Patient given discharge instructions, paperwork & prescription(s). Patient verbalized understanding. Pt left department w/ no further questions.  

## 2014-03-26 NOTE — Discharge Instructions (Signed)
As discussed, it is important that you monitor your condition carefully, and follow up with her primary care physician.  Return here for concerning changes in your condition.

## 2014-05-28 ENCOUNTER — Encounter (HOSPITAL_COMMUNITY): Payer: Self-pay | Admitting: Emergency Medicine

## 2014-05-28 ENCOUNTER — Inpatient Hospital Stay (HOSPITAL_COMMUNITY)
Admission: EM | Admit: 2014-05-28 | Discharge: 2014-06-01 | DRG: 480 | Disposition: A | Discharge status: Skilled Nursing Facility | Payer: Medicare Other | Attending: Family Medicine | Admitting: Family Medicine

## 2014-05-28 ENCOUNTER — Emergency Department (HOSPITAL_COMMUNITY): Payer: Medicare Other

## 2014-05-28 DIAGNOSIS — Y92009 Unspecified place in unspecified non-institutional (private) residence as the place of occurrence of the external cause: Secondary | ICD-10-CM

## 2014-05-28 DIAGNOSIS — S72143A Displaced intertrochanteric fracture of unspecified femur, initial encounter for closed fracture: Principal | ICD-10-CM | POA: Diagnosis present

## 2014-05-28 DIAGNOSIS — I214 Non-ST elevation (NSTEMI) myocardial infarction: Secondary | ICD-10-CM | POA: Diagnosis present

## 2014-05-28 DIAGNOSIS — Z8673 Personal history of transient ischemic attack (TIA), and cerebral infarction without residual deficits: Secondary | ICD-10-CM

## 2014-05-28 DIAGNOSIS — R001 Bradycardia, unspecified: Secondary | ICD-10-CM | POA: Diagnosis present

## 2014-05-28 DIAGNOSIS — G929 Unspecified toxic encephalopathy: Secondary | ICD-10-CM | POA: Diagnosis not present

## 2014-05-28 DIAGNOSIS — S72009A Fracture of unspecified part of neck of unspecified femur, initial encounter for closed fracture: Secondary | ICD-10-CM

## 2014-05-28 DIAGNOSIS — T426X5A Adverse effect of other antiepileptic and sedative-hypnotic drugs, initial encounter: Secondary | ICD-10-CM | POA: Diagnosis not present

## 2014-05-28 DIAGNOSIS — E785 Hyperlipidemia, unspecified: Secondary | ICD-10-CM | POA: Diagnosis present

## 2014-05-28 DIAGNOSIS — Z951 Presence of aortocoronary bypass graft: Secondary | ICD-10-CM

## 2014-05-28 DIAGNOSIS — G92 Toxic encephalopathy: Secondary | ICD-10-CM | POA: Diagnosis not present

## 2014-05-28 DIAGNOSIS — Z7901 Long term (current) use of anticoagulants: Secondary | ICD-10-CM

## 2014-05-28 DIAGNOSIS — S72002A Fracture of unspecified part of neck of left femur, initial encounter for closed fracture: Secondary | ICD-10-CM

## 2014-05-28 DIAGNOSIS — E43 Unspecified severe protein-calorie malnutrition: Secondary | ICD-10-CM | POA: Insufficient documentation

## 2014-05-28 DIAGNOSIS — R7989 Other specified abnormal findings of blood chemistry: Secondary | ICD-10-CM | POA: Diagnosis present

## 2014-05-28 DIAGNOSIS — I10 Essential (primary) hypertension: Secondary | ICD-10-CM | POA: Diagnosis present

## 2014-05-28 DIAGNOSIS — Z794 Long term (current) use of insulin: Secondary | ICD-10-CM

## 2014-05-28 DIAGNOSIS — I498 Other specified cardiac arrhythmias: Secondary | ICD-10-CM | POA: Diagnosis present

## 2014-05-28 DIAGNOSIS — Z833 Family history of diabetes mellitus: Secondary | ICD-10-CM

## 2014-05-28 DIAGNOSIS — R509 Fever, unspecified: Secondary | ICD-10-CM | POA: Diagnosis present

## 2014-05-28 DIAGNOSIS — I2589 Other forms of chronic ischemic heart disease: Secondary | ICD-10-CM | POA: Diagnosis present

## 2014-05-28 DIAGNOSIS — W010XXA Fall on same level from slipping, tripping and stumbling without subsequent striking against object, initial encounter: Secondary | ICD-10-CM | POA: Diagnosis present

## 2014-05-28 DIAGNOSIS — IMO0002 Reserved for concepts with insufficient information to code with codable children: Secondary | ICD-10-CM

## 2014-05-28 DIAGNOSIS — L309 Dermatitis, unspecified: Secondary | ICD-10-CM | POA: Diagnosis present

## 2014-05-28 DIAGNOSIS — M810 Age-related osteoporosis without current pathological fracture: Secondary | ICD-10-CM | POA: Diagnosis present

## 2014-05-28 DIAGNOSIS — L259 Unspecified contact dermatitis, unspecified cause: Secondary | ICD-10-CM | POA: Diagnosis present

## 2014-05-28 DIAGNOSIS — I4891 Unspecified atrial fibrillation: Secondary | ICD-10-CM | POA: Diagnosis present

## 2014-05-28 DIAGNOSIS — E119 Type 2 diabetes mellitus without complications: Secondary | ICD-10-CM | POA: Diagnosis present

## 2014-05-28 DIAGNOSIS — I2510 Atherosclerotic heart disease of native coronary artery without angina pectoris: Secondary | ICD-10-CM | POA: Diagnosis present

## 2014-05-28 DIAGNOSIS — Z8249 Family history of ischemic heart disease and other diseases of the circulatory system: Secondary | ICD-10-CM

## 2014-05-28 DIAGNOSIS — T4275XA Adverse effect of unspecified antiepileptic and sedative-hypnotic drugs, initial encounter: Secondary | ICD-10-CM

## 2014-05-28 DIAGNOSIS — I252 Old myocardial infarction: Secondary | ICD-10-CM

## 2014-05-28 LAB — CBC WITH DIFFERENTIAL/PLATELET
Basophils Absolute: 0 10*3/uL (ref 0.0–0.1)
Basophils Relative: 0 % (ref 0–1)
Eosinophils Absolute: 0.1 10*3/uL (ref 0.0–0.7)
Eosinophils Relative: 0 % (ref 0–5)
HCT: 35.3 % — ABNORMAL LOW (ref 36.0–46.0)
Hemoglobin: 12.3 g/dL (ref 12.0–15.0)
LYMPHS ABS: 1.1 10*3/uL (ref 0.7–4.0)
LYMPHS PCT: 10 % — AB (ref 12–46)
MCH: 31.6 pg (ref 26.0–34.0)
MCHC: 34.8 g/dL (ref 30.0–36.0)
MCV: 90.7 fL (ref 78.0–100.0)
Monocytes Absolute: 0.6 10*3/uL (ref 0.1–1.0)
Monocytes Relative: 5 % (ref 3–12)
NEUTROS PCT: 85 % — AB (ref 43–77)
Neutro Abs: 9.9 10*3/uL — ABNORMAL HIGH (ref 1.7–7.7)
PLATELETS: 164 10*3/uL (ref 150–400)
RBC: 3.89 MIL/uL (ref 3.87–5.11)
RDW: 12.7 % (ref 11.5–15.5)
WBC: 11.7 10*3/uL — AB (ref 4.0–10.5)

## 2014-05-28 LAB — URINALYSIS, ROUTINE W REFLEX MICROSCOPIC
Bilirubin Urine: NEGATIVE
Glucose, UA: 100 mg/dL — AB
Hgb urine dipstick: NEGATIVE
Ketones, ur: NEGATIVE mg/dL
LEUKOCYTES UA: NEGATIVE
Nitrite: NEGATIVE
PH: 5.5 (ref 5.0–8.0)
Protein, ur: NEGATIVE mg/dL
Specific Gravity, Urine: 1.01 (ref 1.005–1.030)
Urobilinogen, UA: 0.2 mg/dL (ref 0.0–1.0)

## 2014-05-28 LAB — BASIC METABOLIC PANEL
Anion gap: 11 (ref 5–15)
BUN: 24 mg/dL — ABNORMAL HIGH (ref 6–23)
CHLORIDE: 103 meq/L (ref 96–112)
CO2: 26 meq/L (ref 19–32)
Calcium: 9.1 mg/dL (ref 8.4–10.5)
Creatinine, Ser: 0.78 mg/dL (ref 0.50–1.10)
GFR calc Af Amer: 83 mL/min — ABNORMAL LOW (ref >90)
GFR calc non Af Amer: 71 mL/min — ABNORMAL LOW (ref >90)
GLUCOSE: 191 mg/dL — AB (ref 70–99)
POTASSIUM: 3.8 meq/L (ref 3.7–5.3)
SODIUM: 140 meq/L (ref 137–147)

## 2014-05-28 LAB — PROTIME-INR
INR: 1.46 (ref 0.00–1.49)
Prothrombin Time: 17.7 seconds — ABNORMAL HIGH (ref 11.6–15.2)

## 2014-05-28 LAB — ALBUMIN: Albumin: 3.7 g/dL (ref 3.5–5.2)

## 2014-05-28 MED ORDER — LISINOPRIL 10 MG PO TABS
20.0000 mg | ORAL_TABLET | Freq: Every day | ORAL | Status: DC
  Administered 2014-05-28 – 2014-06-01 (×5): 20 mg via ORAL
  Filled 2014-05-28 (×5): qty 2

## 2014-05-28 MED ORDER — SIMVASTATIN 20 MG PO TABS
40.0000 mg | ORAL_TABLET | Freq: Every day | ORAL | Status: DC
Start: 2014-05-28 — End: 2014-06-01
  Administered 2014-05-28 – 2014-05-31 (×4): 40 mg via ORAL
  Filled 2014-05-28 (×4): qty 2

## 2014-05-28 MED ORDER — ALPRAZOLAM 0.5 MG PO TABS
0.5000 mg | ORAL_TABLET | Freq: Three times a day (TID) | ORAL | Status: DC | PRN
  Administered 2014-05-31: 0.5 mg via ORAL
  Filled 2014-05-28: qty 1

## 2014-05-28 MED ORDER — ENOXAPARIN SODIUM 40 MG/0.4ML ~~LOC~~ SOLN
40.0000 mg | SUBCUTANEOUS | Status: DC
  Administered 2014-05-28 – 2014-05-29 (×2): 40 mg via SUBCUTANEOUS
  Filled 2014-05-28 (×2): qty 0.4

## 2014-05-28 MED ORDER — INSULIN GLARGINE 100 UNIT/ML ~~LOC~~ SOLN
10.0000 [IU] | Freq: Every day | SUBCUTANEOUS | Status: DC
  Administered 2014-05-30 – 2014-05-31 (×2): 10 [IU] via SUBCUTANEOUS
  Filled 2014-05-28 (×5): qty 0.1

## 2014-05-28 MED ORDER — MORPHINE SULFATE 2 MG/ML IJ SOLN: 0.5000 mg | INTRAMUSCULAR | Status: DC | PRN

## 2014-05-28 MED ORDER — HYDROCODONE-ACETAMINOPHEN 5-325 MG PO TABS
1.0000 | ORAL_TABLET | Freq: Four times a day (QID) | ORAL | Status: DC | PRN
  Administered 2014-05-29: 1 via ORAL
  Filled 2014-05-28: qty 1

## 2014-05-28 MED ORDER — MIRABEGRON ER 25 MG PO TB24
25.0000 mg | ORAL_TABLET | Freq: Every day | ORAL | Status: DC
  Administered 2014-05-28 – 2014-06-01 (×5): 25 mg via ORAL
  Filled 2014-05-28 (×7): qty 1

## 2014-05-28 MED ORDER — QUINAPRIL HCL 10 MG PO TABS: 20.0000 mg | ORAL_TABLET | Freq: Every day | ORAL | Status: DC

## 2014-05-28 MED ORDER — SENNA 8.6 MG PO TABS
1.0000 | ORAL_TABLET | Freq: Two times a day (BID) | ORAL | Status: DC
  Administered 2014-05-28 – 2014-06-01 (×8): 8.6 mg via ORAL
  Filled 2014-05-28 (×8): qty 1

## 2014-05-28 MED ORDER — METOPROLOL SUCCINATE ER 50 MG PO TB24
50.0000 mg | ORAL_TABLET | Freq: Two times a day (BID) | ORAL | Status: DC
  Administered 2014-05-28 – 2014-05-29 (×3): 50 mg via ORAL
  Filled 2014-05-28 (×3): qty 1

## 2014-05-28 MED ORDER — POTASSIUM CHLORIDE CRYS ER 10 MEQ PO TBCR
10.0000 meq | EXTENDED_RELEASE_TABLET | ORAL | Status: DC
  Administered 2014-05-28 – 2014-06-01 (×3): 10 meq via ORAL
  Filled 2014-05-28 (×4): qty 1

## 2014-05-28 MED ORDER — BISACODYL 10 MG RE SUPP
10.0000 mg | Freq: Every day | RECTAL | Status: DC | PRN
  Administered 2014-06-01: 10 mg via RECTAL
  Filled 2014-05-28: qty 1

## 2014-05-28 NOTE — Progress Notes (Signed)
Inpatient Diabetes Program Recommendations  AACE/ADA: New Consensus Statement on Inpatient Glycemic Control (2013)  Target Ranges:  Prepandial:   less than 140 mg/dL      Peak postprandial:   less than 180 mg/dL (1-2 hours)      Critically ill patients:  140 - 180 mg/dL    Results for REDITH, DRACH (MRN [id number]) as of 05/28/2014 11:52  Ref. Range 05/28/2014 06:02  Glucose Latest Range: 70-99 mg/dL 191 (H)   Diabetes history: DM Outpatient Diabetes medications: Lantus 10 units HS, Januvia 50 mg QAM Current orders for Inpatient glycemic control: Lantus 10 units QHS  Inpatient Diabetes Program Recommendations Correction (SSI): Please consider ordering CBGs with Novolog sensitive correction scale ACHS. Diet: Please consider changing diet from Reg to Carb Modified Diabetic diet.  Thanks, Barnie Alderman, RN, MSN, CCRN Diabetes Coordinator Inpatient Diabetes Program [phone] (Team Pager) [phone] (AP office) [phone] [facility] office)

## 2014-05-28 NOTE — H&P (Signed)
[facility] History and Physical  Alicia Mason YQI:[id number] DOB: [birth year] DOA: 05/28/2014  Referring physician: ED PCP: Glo Herring., MD  Specialists: Orthopedics-Dr Aline Brochure  Chief Complaint: Fall and hip pain  HPI:  80 y/o ?, known h/o R MCA CVA 07/07/13 [with prior cva as well], Paroxysmal Afib on Coumadin, H/o CABG 1999+ Isch CM, Ty2 DM + hypoglycemia, recurrent Pyelonephritis, prior R proximal humeral fracture 11/2009 operated on by Dr. Aline Brochure admitted from home 2?2 to accidental fall.  Was gettin up and going to RR with walking stick [usually feels 'scared' on taking this].  She missed touching the door and fell onto her L side and hit her face, Her L upper arm with bruises and fell onto her L side.  She was able to eventually get up.  She didn't have any dizziness on getting up, or prior to falling, any blurred vision or seizure or any ataxia or weakness on any one side of the body.  At [place] is active lady-stopped driving 1 year ago she currently gets personal care services 9 until 5 PM and the lady comes to cook and clean for her.  Workup in the emergency room revealed BUN 24/creatinine 0.78, glucose 191, WBC 11.7, hemoglobin 12.3, moderate at 5.3, PTT 17.7, INR 1.46, CT head without contrast 2.2 cm extra-axial well-circumscribed ovoid mass lateral to the left cerebellar hemisphere compatible with meningioma-chronic microvascular ischemic disease  chest x-ray one view stable cardiomegaly prior CABG  hip x-ray = acute intertrochanteric hip without displacement EKG = irregularly irregular rhythm left bundle pattern QRS axis left anterior fascicular block -30 no significant change from prior   Review of Systems:  Denies fever Denies chills Denies cough Denies call Denies sick contacts Denies headache Denies acute pain other than in hip when moved Denies dysuria  Denies blurred vision Denies double vision Denies unilateral weakness  Past Medical History   Diagnosis Date  . Essential hypertension, benign   . Type 2 diabetes mellitus   . Coronary atherosclerosis of native coronary artery     Multivessel  . Closed fracture of right humerus     Managed conservatively 2011  . Cerebrovascular disease   . Seasonal allergies   . Arthritis   . Vertigo   . Ischemic cardiomyopathy     45-50% 07/2013/ EF 40% per Echo 06/2011.  Marland Kitchen Near syncope   . NSTEMI (non-ST elevated myocardial infarction)     06/2011.  . Diabetes mellitus   . Acute ischemic stroke 08/04/2013    Right MCA stroke  . Atrial fibrillation 05/28/2009    Paroxysmal    . Dyslipidemia 08/07/2013   Past Surgical History  Procedure Laterality Date  . Coronary artery bypass graft  1999    LIMA to LAD, SVG to OM1 and OM2, SVG to PDA  . Cataract surgery    . Abdominal hysterectomy      WITH BSO.   Social History:  History   Social History Narrative  . No narrative on file    Allergies  Allergen Reactions  . Cephalexin     NOT SURE OF THE REACTION, BUT WAS TOLD NOT TO TAKE IT. Granddaughter mentioned that patient had hives, rash. Denies anaphylactic reaction.    Family History  Problem Relation Age of Onset  . CAD Sister     Died with MI age 46  . Pneumonia Mother   . Arthritis Other   . Coronary artery disease Other   . Diabetes Other   . Cancer Other  Prior to Admission medications   Medication Sig Start Date End Date Taking? Authorizing Provider  ALPRAZolam Duanne Moron) 0.5 MG tablet Take one tablet by mouth every 8 hours as needed for anxiety 08/07/13   Tiffany L Reed, DO  insulin glargine (LANTUS) 100 UNIT/ML injection Inject 10 Units into the skin at bedtime.    Historical Provider, MD  metoprolol succinate (TOPROL-XL) 50 MG 24 hr tablet Take 1 tablet (50 mg total) by mouth 2 (two) times daily. Take with or immediately following a meal. 08/07/13   Rexene Alberts, MD  mirabegron ER (MYRBETRIQ) 25 MG TB24 tablet Take 1 tablet (25 mg total) by mouth daily. 08/07/13    Rexene Alberts, MD  potassium chloride (K-DUR) 10 MEQ tablet Take 10 mEq by mouth every other day.    Historical Provider, MD  pravastatin (PRAVACHOL) 10 MG tablet Take 10 mg by mouth every morning.     Historical Provider, MD  quinapril (ACCUPRIL) 20 MG tablet Take 1 tablet (20 mg total) by mouth daily. 08/07/13   Rexene Alberts, MD  Rivaroxaban (XARELTO) 15 MG TABS tablet Take 1 tablet (15 mg total) by mouth daily before breakfast. 08/07/13   Rexene Alberts, MD  sitaGLIPtin (JANUVIA) 50 MG tablet Take 50 mg by mouth every morning.    Historical Provider, MD  UNKNOWN TO PATIENT Take 0.5 tablets by mouth every evening. ANTIBIOTIC prescribed by Tarri Glenn (Dermatologist).    Historical Provider, MD   Physical Exam: Filed Vitals:   05/28/14 0544  BP: 159/73  Pulse: 75  Temp: 98.1 F (36.7 C)  TempSrc: Oral  Resp: 18  Height: 5\' 5"  (1.651 m)  Weight: 58.968 kg (130 lb)  SpO2: 95%     General:  EOMI NCAT 3 cm scar/scab maxilla   Eyes: EOMI, no pallor noted is   ENT: Soft supple moderate dentition   Neck: No JVD verrucous skin   Cardiovascular: S1-S2 irregularly irregular displaced PMI no bruit   Respiratory: Clinically clear   Abdomen: Soft nontender nondistended   Skin: Bruising to left lower extremity , pulses intact , scab on left upper arm which is covered with dressing   Musculoskeletal: Range of motion intact   Psychiatric: Euthymic pleasant   Neurologic: Clinically no weakness bilaterally in major muscle groups, reflexes 2/3, finger-nose-finger normal, oriented x3  Labs on Admission:  Basic Metabolic Panel:  Recent Labs Lab 05/28/14 0602  NA 140  K 3.8  CL 103  CO2 26  GLUCOSE 191*  BUN 24*  CREATININE 0.78  CALCIUM 9.1   Liver Function Tests: No results found for this basename: AST, ALT, ALKPHOS, BILITOT, PROT, ALBUMIN,  in the last 168 hours No results found for this basename: LIPASE, AMYLASE,  in the last 168 hours No results found for this basename:  AMMONIA,  in the last 168 hours CBC:  Recent Labs Lab 05/28/14 0602  WBC 11.7*  NEUTROABS 9.9*  HGB 12.3  HCT 35.3*  MCV 90.7  PLT 164   Cardiac Enzymes: No results found for this basename: CKTOTAL, CKMB, CKMBINDEX, TROPONINI,  in the last 168 hours  BNP (last 3 results) No results found for this basename: PROBNP,  in the last 8760 hours CBG: No results found for this basename: GLUCAP,  in the last 168 hours  Radiological Exams on Admission: Dg Chest 1 View  05/28/2014   CLINICAL DATA:  FALL HIP PAIN Pre op  EXAM: CHEST - 1 VIEW  COMPARISON:  Prior radiograph from 08/04/2013  FINDINGS: Median sternotomy wires  of underlying CABG markers and surgical clips again noted, unchanged. Mild cardiomegaly is present. Atherosclerotic calcifications present within the aortic arch.  Lungs are normally inflated. No focal infiltrate, pulmonary edema, or pleural effusion. No pneumothorax.  No acute osseus abnormality. Degenerative osteoarthrosis noted at the right shoulder.  IMPRESSION: 1. No acute cardiopulmonary abnormality. 2. Stable cardiomegaly with sequelae of prior CABG.   Electronically Signed   By: Jeannine Boga M.D.   On: 05/28/2014 06:55   Dg Pelvis 1-2 Views  05/28/2014   CLINICAL DATA:  Left femur pain after a fall.  EXAM: PELVIS - 1-2 VIEW  COMPARISON:  03/26/2014  FINDINGS: Degenerative changes in the lower lumbar spine and in both hips. The pelvis appears intact without displaced fracture identified. There is angular linear lucency in the intertrochanteric/ sub trochanteric region of the left hip with slight cortical irregularity suggested. This probably represents nondisplaced intertrochanteric fracture of the left hip. Dedicated left hip views would be useful for better evaluation. Multiple calcified phleboliths in the pelvis. Vascular calcifications.  IMPRESSION: Probable nondisplaced intertrochanteric fracture of the left hip.   Electronically Signed   By: Lucienne Capers M.D.    On: 05/28/2014 06:53   Dg Femur Left  05/28/2014   CLINICAL DATA:  Left hip pain after a fall.  EXAM: LEFT FEMUR - 2 VIEW  COMPARISON:  11/19/2009  FINDINGS: Angular lucencies in the inter trochanteric/sub trochanteric region of the left proximal femur probably representing nondisplaced fracture. Femoral shaft and metaphysis seal region appear intact. Degenerative changes are present in the hip and knee. Vascular calcifications.  IMPRESSION: Probable acute intertrochanteric fracture left hip without displacement.   Electronically Signed   By: Lucienne Capers M.D.   On: 05/28/2014 06:55   Ct Head Wo Contrast  05/28/2014   CLINICAL DATA:  Headache, fall.  EXAM: CT HEAD WITHOUT CONTRAST  TECHNIQUE: Contiguous axial images were obtained from the base of the skull through the vertex without intravenous contrast.  COMPARISON:  Prior MRI from 08/04/2013 and CT 08/03/2014  FINDINGS: Age-related atrophy with chronic microvascular ischemic changes are seen, not significantly changed relative to prior studies.  No acute intracranial hemorrhage or large vessel territory infarct. No extra-axial fluid collection. No hydrocephalus or midline shift. 2.2 cm ovoid extra-axial lesion at the lateral right cerebellar hemisphere again seen, most compatible with a meningioma.  No calvarial fracture.  Scalp soft tissues within normal limits.  No acute abnormality seen about the orbits.  Visualized paranasal sinuses and mastoid air cells are clear.  IMPRESSION: 1. No acute intracranial process. 2. Advanced age-related atrophy with chronic microvascular ischemic disease. 3. 2.2 cm extra-axial well-circumscribed ovoid mass lateral to the left cerebellar hemisphere, most compatible with a meningioma.   Electronically Signed   By: Jeannine Boga M.D.   On: 05/28/2014 07:01    EKG: Independently reviewed. See above  Assessment/Plan Principal Problem:   Hip fracture-Dr. Aline Brochure of orthopedics has been consulted by EDP. She will  need at least 24-48 hours to wash out her Xarelto and does not need bridging to low molecular weight or regular heparin. we will get a vitamin D level although she presumably has osteoporosis and should be on replacement therapy  Active Problems:   HYPERTENSION-continue metoprolol 50 mg twice a day-she is on XL formulation at home and we will need to clarify if she is taking XL twice a day Continue quinapril 20 mg daily   Atrial fibrillation-Xarelto on hold. She's not had a stroke since 2014 so she  is at relatively low risk for thromboembolic phenomenon and should have some protection over 24-48 hours comes rather well this is washed out of her system. Continue metoprolol for rate control.   NSTEMI (non-ST elevated myocardial infarction)-currently stable at present time-not on antiplatelet agent. Discussion with cardiology needed as outpatient   Diabetes mellitus-continue Lantus 10 each bedtime that time-this will be adjusted during hospital stay. Januvia 50 mg has been placed on hold   Dyslipidemia-continue generic replacement for statin Zocor 40 mg daily   Dermatitis we will hold her ointment  Orthopedics Dr. Aline Brochure consulted by EDP   70 minutes  full CODE STATUS Next of kin = Ike Bene [facility] Pager [phone]   If 7PM-7AM, please contact night-coverage [email or web address] Password [email or web address] 05/28/2014, 7:21 AM

## 2014-05-28 NOTE — ED Provider Notes (Signed)
Discussed hip fracture with Dr. Nicolette Bang, MD 05/28/14 1027

## 2014-05-28 NOTE — ED Provider Notes (Signed)
CSN: [id number]     Arrival date & time 05/28/14  0539 History   First MD Initiated Contact with Patient 05/28/14 0542     Chief Complaint  Patient presents with  . Fall  . Hip Pain  . Arm Injury      HPI  Patient presents after a fall at home. She normally walks with a cane. At 1 AM she was getting up to walk to the bathroom. She tripped over her cane and fell. She has a skin tear over left arm. He has pain in her left leg. She has a device that she wears around her neck or she can call for help. She states "I mashed on it for about a half an hour". Upon arrival of paramedics she had gotten to her feet and was standing at the door just a short distance where she had fallen. She is complaining of left leg pain.  Past Medical History  Diagnosis Date  . Essential hypertension, benign   . Type 2 diabetes mellitus   . Coronary atherosclerosis of native coronary artery     Multivessel  . Closed fracture of right humerus     Managed conservatively 2011  . Cerebrovascular disease   . Seasonal allergies   . Arthritis   . Vertigo   . Ischemic cardiomyopathy     45-50% 07/2013/ EF 40% per Echo 06/2011.  Marland Kitchen Near syncope   . NSTEMI (non-ST elevated myocardial infarction)     06/2011.  . Diabetes mellitus   . Acute ischemic stroke 08/04/2013    Right MCA stroke  . Atrial fibrillation 05/28/2009    Paroxysmal    . Dyslipidemia 08/07/2013   Past Surgical History  Procedure Laterality Date  . Coronary artery bypass graft  1999    LIMA to LAD, SVG to OM1 and OM2, SVG to PDA  . Cataract surgery    . Abdominal hysterectomy      WITH BSO.   Family History  Problem Relation Age of Onset  . CAD Sister     Died with MI age 2  . Pneumonia Mother   . Arthritis Other   . Coronary artery disease Other   . Diabetes Other   . Cancer Other    History  Substance Use Topics  . Smoking status: Never Smoker   . Smokeless tobacco: Never Used  . Alcohol Use: No   OB History   Grav Para Term  Preterm Abortions TAB SAB Ect Mult Living                 Review of Systems  Constitutional: Negative for fever, chills, diaphoresis, appetite change and fatigue.  HENT: Negative for mouth sores, sore throat and trouble swallowing.   Eyes: Negative for visual disturbance.  Respiratory: Negative for cough, chest tightness, shortness of breath and wheezing.   Cardiovascular: Negative for chest pain.  Gastrointestinal: Negative for nausea, vomiting, abdominal pain, diarrhea and abdominal distention.  Endocrine: Negative for polydipsia, polyphagia and polyuria.  Genitourinary: Negative for dysuria, frequency and hematuria.  Musculoskeletal: Negative for gait problem.       Left leg and hip pain  Skin: Negative for color change, pallor and rash.       Skin tears to the left arm.  Neurological: Negative for dizziness, syncope, light-headedness and headaches.  Hematological: Does not bruise/bleed easily.  Psychiatric/Behavioral: Negative for behavioral problems and confusion.      Allergies  Cephalexin  Home Medications   Prior to Admission medications  Medication Sig Start Date End Date Taking? Authorizing Provider  ALPRAZolam Duanne Moron) 0.5 MG tablet Take one tablet by mouth every 8 hours as needed for anxiety 08/07/13   Tiffany L Reed, DO  insulin glargine (LANTUS) 100 UNIT/ML injection Inject 10 Units into the skin at bedtime.    Historical Provider, MD  metoprolol succinate (TOPROL-XL) 50 MG 24 hr tablet Take 1 tablet (50 mg total) by mouth 2 (two) times daily. Take with or immediately following a meal. 08/07/13   Rexene Alberts, MD  mirabegron ER (MYRBETRIQ) 25 MG TB24 tablet Take 1 tablet (25 mg total) by mouth daily. 08/07/13   Rexene Alberts, MD  potassium chloride (K-DUR) 10 MEQ tablet Take 10 mEq by mouth every other day.    Historical Provider, MD  pravastatin (PRAVACHOL) 10 MG tablet Take 10 mg by mouth every morning.     Historical Provider, MD  quinapril (ACCUPRIL) 20 MG tablet  Take 1 tablet (20 mg total) by mouth daily. 08/07/13   Rexene Alberts, MD  Rivaroxaban (XARELTO) 15 MG TABS tablet Take 1 tablet (15 mg total) by mouth daily before breakfast. 08/07/13   Rexene Alberts, MD  sitaGLIPtin (JANUVIA) 50 MG tablet Take 50 mg by mouth every morning.    Historical Provider, MD  UNKNOWN TO PATIENT Take 0.5 tablets by mouth every evening. ANTIBIOTIC prescribed by Tarri Glenn (Dermatologist).    Historical Provider, MD   BP 159/73  Pulse 75  Temp(Src) 98.1 F (36.7 C) (Oral)  Resp 18  Ht 5\' 5"  (1.651 m)  Wt 130 lb (58.968 kg)  BMI 21.63 kg/m2  SpO2 95% Physical Exam  Constitutional: She is oriented to person, place, and time. She appears well-developed and well-nourished. No distress.  HENT:  Head: Normocephalic.  Eyes: Conjunctivae are normal. Pupils are equal, round, and reactive to light. No scleral icterus.  Neck: Normal range of motion. Neck supple. No thyromegaly present.  Cardiovascular: Normal rate.  An irregular rhythm present. Exam reveals no gallop and no friction rub.   No murmur heard. Pulmonary/Chest: Effort normal and breath sounds normal. No respiratory distress. She has no wheezes. She has no rales.  Clear lungs  Abdominal: Soft. Bowel sounds are normal. She exhibits no distension. There is no tenderness. There is no rebound.  Musculoskeletal: Normal range of motion.       Legs: Tenderness to the left upper thigh. No foreshortening or rotation. She is able to lift it from the bed although she states it is painful.  Neurological: She is alert and oriented to person, place, and time.  Skin: Skin is warm and dry. No rash noted.     Psychiatric: She has a normal mood and affect. Her behavior is normal.    ED Course  Procedures (including critical care time) Labs Review Labs Reviewed  CBC WITH DIFFERENTIAL - Abnormal; Notable for the following:    WBC 11.7 (*)    HCT 35.3 (*)    Neutrophils Relative % 85 (*)    Neutro Abs 9.9 (*)    Lymphocytes  Relative 10 (*)    All other components within normal limits  BASIC METABOLIC PANEL - Abnormal; Notable for the following:    Glucose, Bld 191 (*)    BUN 24 (*)    GFR calc non Af Amer 71 (*)    GFR calc Af Amer 83 (*)    All other components within normal limits  URINALYSIS, ROUTINE W REFLEX MICROSCOPIC  PROTIME-INR  TYPE AND SCREEN  Imaging Review Dg Chest 1 View  05/28/2014   CLINICAL DATA:  FALL HIP PAIN Pre op  EXAM: CHEST - 1 VIEW  COMPARISON:  Prior radiograph from 08/04/2013  FINDINGS: Median sternotomy wires of underlying CABG markers and surgical clips again noted, unchanged. Mild cardiomegaly is present. Atherosclerotic calcifications present within the aortic arch.  Lungs are normally inflated. No focal infiltrate, pulmonary edema, or pleural effusion. No pneumothorax.  No acute osseus abnormality. Degenerative osteoarthrosis noted at the right shoulder.  IMPRESSION: 1. No acute cardiopulmonary abnormality. 2. Stable cardiomegaly with sequelae of prior CABG.   Electronically Signed   By: Jeannine Boga M.D.   On: 05/28/2014 06:55   Dg Pelvis 1-2 Views  05/28/2014   CLINICAL DATA:  Left femur pain after a fall.  EXAM: PELVIS - 1-2 VIEW  COMPARISON:  03/26/2014  FINDINGS: Degenerative changes in the lower lumbar spine and in both hips. The pelvis appears intact without displaced fracture identified. There is angular linear lucency in the intertrochanteric/ sub trochanteric region of the left hip with slight cortical irregularity suggested. This probably represents nondisplaced intertrochanteric fracture of the left hip. Dedicated left hip views would be useful for better evaluation. Multiple calcified phleboliths in the pelvis. Vascular calcifications.  IMPRESSION: Probable nondisplaced intertrochanteric fracture of the left hip.   Electronically Signed   By: Lucienne Capers M.D.   On: 05/28/2014 06:53   Dg Femur Left  05/28/2014   CLINICAL DATA:  Left hip pain after a fall.   EXAM: LEFT FEMUR - 2 VIEW  COMPARISON:  11/19/2009  FINDINGS: Angular lucencies in the inter trochanteric/sub trochanteric region of the left proximal femur probably representing nondisplaced fracture. Femoral shaft and metaphysis seal region appear intact. Degenerative changes are present in the hip and knee. Vascular calcifications.  IMPRESSION: Probable acute intertrochanteric fracture left hip without displacement.   Electronically Signed   By: Lucienne Capers M.D.   On: 05/28/2014 06:55   Ct Head Wo Contrast  05/28/2014   CLINICAL DATA:  Headache, fall.  EXAM: CT HEAD WITHOUT CONTRAST  TECHNIQUE: Contiguous axial images were obtained from the base of the skull through the vertex without intravenous contrast.  COMPARISON:  Prior MRI from 08/04/2013 and CT 08/03/2014  FINDINGS: Age-related atrophy with chronic microvascular ischemic changes are seen, not significantly changed relative to prior studies.  No acute intracranial hemorrhage or large vessel territory infarct. No extra-axial fluid collection. No hydrocephalus or midline shift. 2.2 cm ovoid extra-axial lesion at the lateral right cerebellar hemisphere again seen, most compatible with a meningioma.  No calvarial fracture.  Scalp soft tissues within normal limits.  No acute abnormality seen about the orbits.  Visualized paranasal sinuses and mastoid air cells are clear.  IMPRESSION: 1. No acute intracranial process. 2. Advanced age-related atrophy with chronic microvascular ischemic disease. 3. 2.2 cm extra-axial well-circumscribed ovoid mass lateral to the left cerebellar hemisphere, most compatible with a meningioma.   Electronically Signed   By: Jeannine Boga M.D.   On: 05/28/2014 07:01     EKG Interpretation None      MDM   Final diagnoses:  Hip fracture, left, closed, initial encounter    CT shows no acute processes. X-ray of the pelvis and femur show a fracture involving the greater trochanter and probable intertrochanteric  hip fracture. Also placed internal medicine regarding admission and orthopedics regarding consultation.    Tanna Furry, MD 05/28/14 [phone]

## 2014-05-28 NOTE — Progress Notes (Addendum)
INITIAL NUTRITION ASSESSMENT  DOCUMENTATION CODES Per approved criteria  -Severe malnutrition in the context of chronic illness   INTERVENTION:  Milk with all meals  Ensure Complete po BID, each supplement provides 350 kcal and 13 grams of protein   NUTRITION DIAGNOSIS: Inadequate oral intake related to limited food/beverage intake over time as evidenced by muscle wasting and 22% unplanned wt loss over 10 months.   Goal: Pt to meet >/= 90% of their estimated nutrition needs    Monitor:  Po intake, labs and wt trends   Reason for Assessment: Assess nutrition status/ requirements  80 y.o. female  Admitting Dx: Hip fracture, left  ASSESSMENT:  Pt is very pleasant 80 yo who lives at home. She is able to feed herself and has caregiver.  Hx includes CAD, DM, and ischemic cardiomyopathy. Admission last September due to unresponsive, with fever, UTI and hypoglycemia. 08/04/2013 RADIOLOGY REPORT: Acute right MCA territory small infarctions compatible with embolic event.  She was discharged to [facility] for rehab. Then discharge from [facility] 10/21/13 and has been living at home.She fall while doing yard work (03/26/14) see was see in ED and released.   Pt presented to ED (7/9) after falling over her cane. Dg-Pelvis results: probable nondisplaced intertrochanteric fracture of the left hip.  Pt has experienced unplanned weight loss since last September of 22% which is significant. Question related to aging and functional decline? Suspect chronic undernutrition and inflammtory process.  She drinks milk at home and says she was told not to drink Ensure she didn't need it. Pt was strongly encouraged to drink Ensure two times daily going forward to prevent further weight loss and help her meet macro/micro nutrient needs.  Nutrition Focused Physical Exam:  Subcutaneous Fat:  Orbital Region: mild wasting Upper Arm Region: mild wasting Thoracic and Lumbar Region: not assessed  Muscle:   Temple Region: mild wasting Clavicle Bone Region: mild-moderate wasting Clavicle and Acromion Bone Region: mild wasting Scapular Bone Region: not assessed Dorsal Hand: mild wasting Patellar Region: moderate wasting Anterior Thigh Region: moderate wasting Posterior Calf Region: mild-moderate wasting  Edema: none   Pt meets criteria for severe MALNUTRITION in the context of chronic illness as evidenced by 22% weight loss and mild-moderate muscle wasting.   Height: Ht Readings from Last 1 Encounters:  05/28/14 5\' 5"  (1.651 m)    Weight: Wt Readings from Last 1 Encounters:  05/28/14 119 lb 11.4 oz (54.3 kg)    Ideal Body Weight: 125#  % Ideal Body Weight: 96%  Wt Readings from Last 10 Encounters:  05/28/14 119 lb 11.4 oz (54.3 kg)  08/07/13 152 lb 12.5 oz (69.3 kg)  07/31/13 143 lb 9.6 oz (65.137 kg)  07/31/11 157 lb (71.215 kg)  07/12/11 152 lb 1.9 oz (69 kg)    Usual Body Weight: 140-150#  % Usual Body Weight: 86%  BMI:  Body mass index is 19.92 kg/(m^2). normal range  Estimated Nutritional Needs: Kcal: 1500-1600 Protein: 65-75 gr Fluid: >1500 ml daily  Skin: multiple skin tears  Diet Order: General  EDUCATION NEEDS: -No education needs identified at this time  No intake or output data in the 24 hours ending 05/28/14 1407  Last BM: 05/27/14  Labs:   Recent Labs Lab 05/28/14 0602  NA 140  K 3.8  CL 103  CO2 26  BUN 24*  CREATININE 0.78  CALCIUM 9.1  GLUCOSE 191*    CBG (last 3)  No results found for this basename: GLUCAP,  in the  last 72 hours  Scheduled Meds: . enoxaparin (LOVENOX) injection  40 mg Subcutaneous Q24H  . insulin glargine  10 Units Subcutaneous QHS  . lisinopril  20 mg Oral Daily  . metoprolol succinate  50 mg Oral BID  . mirabegron ER  25 mg Oral Daily  . potassium chloride  10 mEq Oral QODAY  . senna  1 tablet Oral BID  . simvastatin  40 mg Oral q1800    Continuous Infusions:   Past Medical History  Diagnosis Date   . Essential hypertension, benign   . Type 2 diabetes mellitus   . Coronary atherosclerosis of native coronary artery     Multivessel  . Closed fracture of right humerus     Managed conservatively 2011  . Cerebrovascular disease   . Seasonal allergies   . Arthritis   . Vertigo   . Ischemic cardiomyopathy     45-50% 07/2013/ EF 40% per Echo 06/2011.  Marland Kitchen Near syncope   . NSTEMI (non-ST elevated myocardial infarction)     06/2011.  . Diabetes mellitus   . Acute ischemic stroke 08/04/2013    Right MCA stroke  . Atrial fibrillation 05/28/2009    Paroxysmal    . Dyslipidemia 08/07/2013    Past Surgical History  Procedure Laterality Date  . Coronary artery bypass graft  1999    LIMA to LAD, SVG to OM1 and OM2, SVG to PDA  . Cataract surgery    . Abdominal hysterectomy      WITH BSO.    Colman Cater MS,RD,CSG,LDN Office: [phone] Pager: [phone]

## 2014-05-28 NOTE — ED Notes (Addendum)
Patient from home, states she fell over her cane at approximately 0100 this morning. Complaining of left hip pain. Patient has skin tear to upper left arm. EMS states patient was standing at door when they arrived to home.

## 2014-05-28 NOTE — Progress Notes (Signed)
Patient ID: Alicia Mason, female   DOB: [birth year], 80 y.o.   MRN: [id number]  Aware of consultation regarding left hip fracture. Patient is on xarelto;  will have to be stopped. It is okay to anticoagulate her today up until midnight. I will be out of town but that time her anticoagulants wear off. I will discuss the case with Dr. Luna Glasgow and will speak with the medical physician to determine treatment

## 2014-05-28 NOTE — ED Notes (Signed)
Hospital MD at bedside

## 2014-05-29 DIAGNOSIS — E43 Unspecified severe protein-calorie malnutrition: Secondary | ICD-10-CM | POA: Insufficient documentation

## 2014-05-29 LAB — GLUCOSE, CAPILLARY
Glucose-Capillary: 224 mg/dL — ABNORMAL HIGH (ref 70–99)
Glucose-Capillary: 110 mg/dL — ABNORMAL HIGH (ref 70–99)
Glucose-Capillary: 262 mg/dL — ABNORMAL HIGH (ref 70–99)

## 2014-05-29 LAB — CBC WITH DIFFERENTIAL/PLATELET
BASOS ABS: 0 10*3/uL (ref 0.0–0.1)
BASOS PCT: 0 % (ref 0–1)
EOS PCT: 1 % (ref 0–5)
Eosinophils Absolute: 0.1 10*3/uL (ref 0.0–0.7)
HEMATOCRIT: 32.9 % — AB (ref 36.0–46.0)
Hemoglobin: 11.6 g/dL — ABNORMAL LOW (ref 12.0–15.0)
LYMPHS PCT: 15 % (ref 12–46)
Lymphs Abs: 1.2 10*3/uL (ref 0.7–4.0)
MCH: 31.8 pg (ref 26.0–34.0)
MCHC: 35.3 g/dL (ref 30.0–36.0)
MCV: 90.1 fL (ref 78.0–100.0)
MONO ABS: 0.4 10*3/uL (ref 0.1–1.0)
Monocytes Relative: 5 % (ref 3–12)
Neutro Abs: 6.2 10*3/uL (ref 1.7–7.7)
Neutrophils Relative %: 79 % — ABNORMAL HIGH (ref 43–77)
PLATELETS: 140 10*3/uL — AB (ref 150–400)
RBC: 3.65 MIL/uL — ABNORMAL LOW (ref 3.87–5.11)
RDW: 12.6 % (ref 11.5–15.5)
WBC: 7.9 10*3/uL (ref 4.0–10.5)

## 2014-05-29 LAB — BASIC METABOLIC PANEL
ANION GAP: 10 (ref 5–15)
BUN: 17 mg/dL (ref 6–23)
CALCIUM: 8.5 mg/dL (ref 8.4–10.5)
CO2: 26 mEq/L (ref 19–32)
Chloride: 99 mEq/L (ref 96–112)
Creatinine, Ser: 0.71 mg/dL (ref 0.50–1.10)
GFR calc Af Amer: 85 mL/min — ABNORMAL LOW (ref >90)
GFR calc non Af Amer: 74 mL/min — ABNORMAL LOW (ref >90)
Glucose, Bld: 195 mg/dL — ABNORMAL HIGH (ref 70–99)
Potassium: 3.7 mEq/L (ref 3.7–5.3)
SODIUM: 135 meq/L — AB (ref 137–147)

## 2014-05-29 LAB — PROTIME-INR
INR: 1.19 (ref 0.00–1.49)
Prothrombin Time: 15.1 seconds (ref 11.6–15.2)

## 2014-05-29 LAB — VITAMIN D 25 HYDROXY (VIT D DEFICIENCY, FRACTURES): Vit D, 25-Hydroxy: 41 ng/mL (ref 30–89)

## 2014-05-29 LAB — PREPARE RBC (CROSSMATCH)

## 2014-05-29 LAB — ABO/RH: ABO/RH(D): O NEG

## 2014-05-29 MED ORDER — VANCOMYCIN HCL IN DEXTROSE 1-5 GM/200ML-% IV SOLN
1000.0000 mg | Freq: Once | INTRAVENOUS | Status: AC
  Administered 2014-05-30: 1000 mg via INTRAVENOUS
  Filled 2014-05-29 (×2): qty 200

## 2014-05-29 MED ORDER — SODIUM CHLORIDE 0.9 % IJ SOLN: 3.0000 mL | INTRAMUSCULAR | Status: DC | PRN

## 2014-05-29 MED ORDER — METOPROLOL TARTRATE 50 MG PO TABS
50.0000 mg | ORAL_TABLET | Freq: Two times a day (BID) | ORAL | Status: DC
  Administered 2014-05-29 – 2014-06-01 (×5): 50 mg via ORAL
  Filled 2014-05-29 (×6): qty 1

## 2014-05-29 MED ORDER — INSULIN ASPART 100 UNIT/ML ~~LOC~~ SOLN
3.0000 [IU] | Freq: Three times a day (TID) | SUBCUTANEOUS | Status: DC
  Administered 2014-05-29 – 2014-05-31 (×3): 3 [IU] via SUBCUTANEOUS

## 2014-05-29 MED ORDER — ENSURE COMPLETE PO LIQD
237.0000 mL | Freq: Two times a day (BID) | ORAL | Status: DC
  Administered 2014-05-29 – 2014-06-01 (×5): 237 mL via ORAL

## 2014-05-29 MED ORDER — INSULIN ASPART 100 UNIT/ML ~~LOC~~ SOLN
0.0000 [IU] | Freq: Three times a day (TID) | SUBCUTANEOUS | Status: DC
  Administered 2014-05-29: 5 [IU] via SUBCUTANEOUS
  Administered 2014-05-30: 3 [IU] via SUBCUTANEOUS
  Administered 2014-05-30: 5 [IU] via SUBCUTANEOUS
  Administered 2014-05-31: 11 [IU] via SUBCUTANEOUS
  Administered 2014-05-31: 5 [IU] via SUBCUTANEOUS
  Administered 2014-05-31: 3 [IU] via SUBCUTANEOUS
  Administered 2014-06-01: 11 [IU] via SUBCUTANEOUS
  Administered 2014-06-01: 3 [IU] via SUBCUTANEOUS

## 2014-05-29 MED ORDER — POVIDONE-IODINE 10 % EX SOLN
Freq: Once | CUTANEOUS | Status: AC
Start: 2014-05-29 — End: 2014-05-29
  Administered 2014-05-29: 22:00:00 via TOPICAL
  Filled 2014-05-29: qty 118

## 2014-05-29 MED ORDER — PROMETHAZINE HCL 25 MG/ML IJ SOLN: 12.5000 mg | Freq: Four times a day (QID) | INTRAMUSCULAR | Status: DC | PRN

## 2014-05-29 MED ORDER — SODIUM CHLORIDE 0.9 % IJ SOLN
3.0000 mL | Freq: Two times a day (BID) | INTRAMUSCULAR | Status: DC
  Administered 2014-05-29 – 2014-06-01 (×6): 3 mL via INTRAVENOUS

## 2014-05-29 MED ORDER — SODIUM CHLORIDE 0.9 % IV SOLN: 250.0000 mL | INTRAVENOUS | Status: DC | PRN

## 2014-05-29 MED ORDER — TRAMADOL HCL 50 MG PO TABS
50.0000 mg | ORAL_TABLET | Freq: Four times a day (QID) | ORAL | Status: DC | PRN
  Administered 2014-05-30 – 2014-05-31 (×2): 50 mg via ORAL
  Filled 2014-05-29 (×3): qty 1

## 2014-05-29 NOTE — Progress Notes (Signed)
Note: This document was prepared with digital dictation and possible smart phrase technology. Any transcriptional errors that result from this process are unintentional.   TAYLI BUCH [id number]:[id number] DOB: 12-04-1921 DOA: 05/28/2014 PCP: Glo Herring., MD  Brief narrative:  80 y/o ?, known h/o R MCA CVA 07/07/13 [name] prior cva as well], Paroxysmal Afib on Coumadin, H/o CABG 1999+ Isch CM, Ty2 DM + hypoglycemia, recurrent Pyelonephritis, prior R proximal humeral fracture 11/2009 operated on by Dr. Aline Brochure admitted from home 2?2 to accidental fall   Past medical history-As per Problem list Chart reviewed as below- none  Consultants:  Orthopedics  Procedures:  None yet  Antibiotics:  none   Subjective  Felt hot and had stomach upset with vomit x 2 after pain emdicine Still feels uncomfortable No cp or sob  No fever or chills or cough   Objective    Interim History:   Telemetry: nsr   Objective: Filed Vitals:   05/29/14 0400 05/29/14 0417 05/29/14 0840 05/29/14 1153  BP:  149/67 126/72   Pulse:  66 69   Temp:  98.2 F (36.8 C) 98.2 F (36.8 C)   TempSrc:  Oral Oral   Resp: 20 20 18 20   Height:      Weight:      SpO2: 95% 95% 98% 98%    Intake/Output Summary (Last 24 hours) at 05/29/14 1309 Last data filed at 05/29/14 1300  Gross per 24 hour  Intake    463 ml  Output   1000 ml  Net   -537 ml    Exam:  General: looks fair, in nad currently Cardiovascular: s1 s2 slightly trachycardic Respiratory: clear, no added sound Abdomen: soft, NT/ND no rebound or gaurding Skinno le edema Neuro intact  Data Reviewed: Basic Metabolic Panel:  Recent Labs Lab 05/28/14 0602 05/29/14 0753  NA 140 135*  K 3.8 3.7  CL 103 99  CO2 26 26  GLUCOSE 191* 195*  BUN 24* 17  CREATININE 0.78 0.71  CALCIUM 9.1 8.5   Liver Function Tests:  Recent Labs Lab 05/28/14 0650  ALBUMIN 3.7   No results found for this basename: LIPASE, AMYLASE,  in the last  168 hours No results found for this basename: AMMONIA,  in the last 168 hours CBC:  Recent Labs Lab 05/28/14 0602 05/29/14 0753  WBC 11.7* 7.9  NEUTROABS 9.9* 6.2  HGB 12.3 11.6*  HCT 35.3* 32.9*  MCV 90.7 90.1  PLT 164 140*   Cardiac Enzymes: No results found for this basename: CKTOTAL, CKMB, CKMBINDEX, TROPONINI,  in the last 168 hours BNP: No components found with this basename: POCBNP,  CBG:  Recent Labs Lab 05/29/14 1303  GLUCAP 224*    No results found for this or any previous visit (from the past 240 hour(s)).   Studies:              All Imaging reviewed and is as per above notation   Scheduled Meds: . enoxaparin (LOVENOX) injection  40 mg Subcutaneous Q24H  . feeding supplement (ENSURE COMPLETE)  237 mL Oral BID BM  . insulin aspart  0-15 Units Subcutaneous TID WC  . insulin aspart  3 Units Subcutaneous TID WC  . insulin glargine  10 Units Subcutaneous QHS  . lisinopril  20 mg Oral Daily  . metoprolol succinate  50 mg Oral BID  . mirabegron ER  25 mg Oral Daily  . potassium chloride  10 mEq Oral QODAY  . senna  1 tablet  Oral BID  . simvastatin  40 mg Oral q1800  . sodium chloride  3 mL Intravenous Q12H   Continuous Infusions:    Assessment/Plan: Hip fracture-Dr. Aline Brochure of orthopedics has been consulted-plan for surgery soon.  D/c morphine and percocet as n/v with pain meds.  Likely xarelto s/p surgery  HYPERTENSION-continue metoprolol 50 mg twice a day-change to regular formulationContinue quinapril 20 mg daily  Atrial fibrillation-Xarelto on hold. She's not had a stroke since 2014 so she is at relatively low risk for thromboembolic phenomenon and should have some protection over 24-48 hours comes rather well this is washed out of her system. Continue metoprolol for rate control.  H/o NSTEMI (non-ST elevated myocardial infarction)-currently stable at present time-not on antiplatelet agent. Discussion with cardiology needed as outpatient  Diabetes  mellitus-continue Lantus 10 each bedtime that time-blood sugars elevated and wasn't given lantus last pm Januvia 50 mg has been placed on hold-ssi ordered Dyslipidemia-continue generic replacement for statin Zocor 40 mg daily  Dermatitis we will hold her ointment   Code Status: full Family Communication:  None bedside Disposition Plan: inpatient   Verneita Griffes, MD  [facility] Pager [phone] 05/29/2014, 1:09 PM    LOS: 1 day

## 2014-05-29 NOTE — Care Management Note (Addendum)
    Page 1 of 1   06/01/2014     10:26:49 AM CARE MANAGEMENT NOTE 06/01/2014  Patient:  Alicia Mason, Alicia Mason   Account Number:  [id number]  Date Initiated:  05/29/2014  Documentation initiated by:  Theophilus Kinds  Subjective/Objective Assessment:   Pt admitted from home with hip fracture. Pt lives alone but has hired caregivers 7 days a week, from 9-9. Pt uses a cane and has a walker as well. Pt is agreeable to SNF if needed after surgery.     Action/Plan:   CSW is aware of possible rehab need. Pt for surgery next week. Pt needs to clear xarelto.   Anticipated DC Date:  06/04/2014   Anticipated DC Plan:  SKILLED NURSING FACILITY  In-house referral  Clinical Social Worker      DC Planning Services  CM consult      Choice offered to / List presented to:             Status of service:  Completed, signed off Medicare Important Message given?  YES (If response is "NO", the following Medicare IM given date fields will be blank) Date Medicare IM given:  06/01/2014 Medicare IM given by:  Theophilus Kinds Date Additional Medicare IM given:   Additional Medicare IM given by:    Discharge Disposition:    Per UR Regulation:    If discussed at Long Length of Stay Meetings, dates discussed:    Comments:  06/01/14 [place], RN BSN CM Pt discharged to [facility] today. CSW to arrange discharge to facility.  05/29/14 [place], RN BSN CM

## 2014-05-29 NOTE — Consult Note (Signed)
Reason for Consult:  Referring Physician: Medical staff hospitalist [facility] Alicia Mason is an 80 y.o. female.  HPI: HPI:   80 y/o ?, known h/o R MCA CVA 07/07/13 [with prior cva as well], Paroxysmal Afib on Coumadin, H/o CABG 1999+ Isch CM, Ty2 DM + hypoglycemia, recurrent Pyelonephritis, prior R proximal humeral fracture 11/2009 operated on by Dr. Aline Brochure admitted from home 2?2 to accidental fall.  Was gettin up and going to RR with walking stick [usually feels 'scared' on taking this].  She missed touching the door and fell onto her L side and hit her face, Her L upper arm with bruises and fell onto her L side.  She was able to eventually get up.  She didn't have any dizziness on getting up, or prior to falling, any blurred vision or seizure or any ataxia or weakness on any one side of the body.   At [place] is active lady-stopped driving 1 year ago she currently gets personal care services 9 until 5 PM and the lady comes to cook and clean for her.  Workup in the emergency room revealed BUN 24/creatinine 0.78, glucose 191, WBC 11.7, hemoglobin 12.3, moderate at 5.3, PTT 17.7, INR 1.46, CT head without contrast 2.2 cm extra-axial well-circumscribed ovoid mass lateral to the left cerebellar hemisphere compatible with meningioma-chronic microvascular ischemic disease   chest x-ray one view stable cardiomegaly prior CABG   hip x-ray = acute intertrochanteric hip without displacement EKG = irregularly irregular rhythm left bundle pattern QRS axis left anterior fascicular block -30 no significant change from prior   Past Medical History  Diagnosis Date  . Essential hypertension, benign   . Type 2 diabetes mellitus   . Coronary atherosclerosis of native coronary artery     Multivessel  . Closed fracture of right humerus     Managed conservatively 2011  . Cerebrovascular disease   . Seasonal allergies   . Arthritis   . Vertigo   . Ischemic cardiomyopathy     45-50% 07/2013/ EF 40% per Echo  06/2011.  Marland Kitchen Near syncope   . NSTEMI (non-ST elevated myocardial infarction)     06/2011.  . Diabetes mellitus   . Acute ischemic stroke 08/04/2013    Right MCA stroke  . Atrial fibrillation 05/28/2009    Paroxysmal    . Dyslipidemia 08/07/2013    Past Surgical History  Procedure Laterality Date  . Coronary artery bypass graft  1999    LIMA to LAD, SVG to OM1 and OM2, SVG to PDA  . Cataract surgery    . Abdominal hysterectomy      WITH BSO.    Family History  Problem Relation Age of Onset  . CAD Sister     Died with MI age 40  . Pneumonia Mother   . Arthritis Other   . Coronary artery disease Other   . Diabetes Other   . Cancer Other     Social History:  reports that she has never smoked. She has never used smokeless tobacco. She reports that she does not drink alcohol or use illicit drugs.  Allergies:  Allergies  Allergen Reactions  . Cephalexin     NOT SURE OF THE REACTION, BUT WAS TOLD NOT TO TAKE IT. Granddaughter mentioned that patient had hives, rash. Denies anaphylactic reaction.    Medications: I have reviewed the patient's current medications.  Results for orders placed during the hospital encounter of 05/28/14 (from the past 48 hour(s))  CBC WITH DIFFERENTIAL  Status: Abnormal   Collection Time    05/28/14  6:02 AM      Result Value Ref Range   WBC 11.7 (*) 4.0 - 10.5 K/uL   RBC 3.89  3.87 - 5.11 MIL/uL   Hemoglobin 12.3  12.0 - 15.0 g/dL   HCT 35.3 (*) 36.0 - 46.0 %   MCV 90.7  78.0 - 100.0 fL   MCH 31.6  26.0 - 34.0 pg   MCHC 34.8  30.0 - 36.0 g/dL   RDW 12.7  11.5 - 15.5 %   Platelets 164  150 - 400 K/uL   Neutrophils Relative % 85 (*) 43 - 77 %   Neutro Abs 9.9 (*) 1.7 - 7.7 K/uL   Lymphocytes Relative 10 (*) 12 - 46 %   Lymphs Abs 1.1  0.7 - 4.0 K/uL   Monocytes Relative 5  3 - 12 %   Monocytes Absolute 0.6  0.1 - 1.0 K/uL   Eosinophils Relative 0  0 - 5 %   Eosinophils Absolute 0.1  0.0 - 0.7 K/uL   Basophils Relative 0  0 - 1 %    Basophils Absolute 0.0  0.0 - 0.1 K/uL  BASIC METABOLIC PANEL     Status: Abnormal   Collection Time    05/28/14  6:02 AM      Result Value Ref Range   Sodium 140  137 - 147 mEq/L   Potassium 3.8  3.7 - 5.3 mEq/L   Chloride 103  96 - 112 mEq/L   CO2 26  19 - 32 mEq/L   Glucose, Bld 191 (*) 70 - 99 mg/dL   BUN 24 (*) 6 - 23 mg/dL   Creatinine, Ser 0.78  0.50 - 1.10 mg/dL   Calcium 9.1  8.4 - 10.5 mg/dL   GFR calc non Af Amer 71 (*) >90 mL/min   GFR calc Af Amer 83 (*) >90 mL/min   Comment: (NOTE)     The eGFR has been calculated using the CKD EPI equation.     This calculation has not been validated in all clinical situations.     eGFR's persistently <90 mL/min signify possible Chronic Kidney     Disease.   Anion gap 11  5 - 15  ALBUMIN     Status: None   Collection Time    05/28/14  6:50 AM      Result Value Ref Range   Albumin 3.7  3.5 - 5.2 g/dL  VITAMIN D 25 HYDROXY     Status: None   Collection Time    05/28/14  6:50 AM      Result Value Ref Range   Vit D, 25-Hydroxy 41  30 - 89 ng/mL   Comment: (NOTE)     This assay accurately quantifies Vitamin D, which is the sum of the     25-Hydroxy forms of Vitamin D2 and D3.  Studies have shown that the     optimum concentration of 25-Hydroxy Vitamin D is 30 ng/mL or higher.      Concentrations of Vitamin D between 20 and 29 ng/mL are considered to     be insufficient and concentrations less than 20 ng/mL are considered     to be deficient for Vitamin D.     Performed at [facility]     Status: None   Collection Time    05/28/14  7:02 AM      Result Value Ref Range  ABO/RH(D) O NEG     Antibody Screen NEG     Sample Expiration 05/31/2014    PROTIME-INR     Status: Abnormal   Collection Time    05/28/14  7:02 AM      Result Value Ref Range   Prothrombin Time 17.7 (*) 11.6 - 15.2 seconds   INR 1.46  0.00 - 1.49  URINALYSIS, ROUTINE W REFLEX MICROSCOPIC     Status: Abnormal   Collection Time     05/28/14  7:05 AM      Result Value Ref Range   Color, Urine YELLOW  YELLOW   APPearance CLEAR  CLEAR   Specific Gravity, Urine 1.010  1.005 - 1.030   pH 5.5  5.0 - 8.0   Glucose, UA 100 (*) NEGATIVE mg/dL   Hgb urine dipstick NEGATIVE  NEGATIVE   Bilirubin Urine NEGATIVE  NEGATIVE   Ketones, ur NEGATIVE  NEGATIVE mg/dL   Protein, ur NEGATIVE  NEGATIVE mg/dL   Urobilinogen, UA 0.2  0.0 - 1.0 mg/dL   Nitrite NEGATIVE  NEGATIVE   Leukocytes, UA NEGATIVE  NEGATIVE   Comment: MICROSCOPIC NOT DONE ON URINES WITH NEGATIVE PROTEIN, BLOOD, LEUKOCYTES, NITRITE, OR GLUCOSE <1000 mg/dL.  CBC WITH DIFFERENTIAL     Status: Abnormal   Collection Time    05/29/14  7:53 AM      Result Value Ref Range   WBC 7.9  4.0 - 10.5 K/uL   RBC 3.65 (*) 3.87 - 5.11 MIL/uL   Hemoglobin 11.6 (*) 12.0 - 15.0 g/dL   HCT 32.9 (*) 36.0 - 46.0 %   MCV 90.1  78.0 - 100.0 fL   MCH 31.8  26.0 - 34.0 pg   MCHC 35.3  30.0 - 36.0 g/dL   RDW 12.6  11.5 - 15.5 %   Platelets 140 (*) 150 - 400 K/uL   Neutrophils Relative % 79 (*) 43 - 77 %   Neutro Abs 6.2  1.7 - 7.7 K/uL   Lymphocytes Relative 15  12 - 46 %   Lymphs Abs 1.2  0.7 - 4.0 K/uL   Monocytes Relative 5  3 - 12 %   Monocytes Absolute 0.4  0.1 - 1.0 K/uL   Eosinophils Relative 1  0 - 5 %   Eosinophils Absolute 0.1  0.0 - 0.7 K/uL   Basophils Relative 0  0 - 1 %   Basophils Absolute 0.0  0.0 - 0.1 K/uL  BASIC METABOLIC PANEL     Status: Abnormal   Collection Time    05/29/14  7:53 AM      Result Value Ref Range   Sodium 135 (*) 137 - 147 mEq/L   Potassium 3.7  3.7 - 5.3 mEq/L   Chloride 99  96 - 112 mEq/L   CO2 26  19 - 32 mEq/L   Glucose, Bld 195 (*) 70 - 99 mg/dL   BUN 17  6 - 23 mg/dL   Creatinine, Ser 0.71  0.50 - 1.10 mg/dL   Calcium 8.5  8.4 - 10.5 mg/dL   GFR calc non Af Amer 74 (*) >90 mL/min   GFR calc Af Amer 85 (*) >90 mL/min   Comment: (NOTE)     The eGFR has been calculated using the CKD EPI equation.     This calculation has not been  validated in all clinical situations.     eGFR's persistently <90 mL/min signify possible Chronic Kidney     Disease.   Anion gap  10  5 - 15  PROTIME-INR     Status: None   Collection Time    05/29/14  7:53 AM      Result Value Ref Range   Prothrombin Time 15.1  11.6 - 15.2 seconds   INR 1.19  0.00 - 1.49  GLUCOSE, CAPILLARY     Status: Abnormal   Collection Time    05/29/14  1:03 PM      Result Value Ref Range   Glucose-Capillary 224 (*) 70 - 99 mg/dL    Dg Chest 1 View  05/28/2014   CLINICAL DATA:  FALL HIP PAIN Pre op  EXAM: CHEST - 1 VIEW  COMPARISON:  Prior radiograph from 08/04/2013  FINDINGS: Median sternotomy wires of underlying CABG markers and surgical clips again noted, unchanged. Mild cardiomegaly is present. Atherosclerotic calcifications present within the aortic arch.  Lungs are normally inflated. No focal infiltrate, pulmonary edema, or pleural effusion. No pneumothorax.  No acute osseus abnormality. Degenerative osteoarthrosis noted at the right shoulder.  IMPRESSION: 1. No acute cardiopulmonary abnormality. 2. Stable cardiomegaly with sequelae of prior CABG.   Electronically Signed   By: Jeannine Boga M.D.   On: 05/28/2014 06:55   Dg Pelvis 1-2 Views  05/28/2014   CLINICAL DATA:  Left femur pain after a fall.  EXAM: PELVIS - 1-2 VIEW  COMPARISON:  03/26/2014  FINDINGS: Degenerative changes in the lower lumbar spine and in both hips. The pelvis appears intact without displaced fracture identified. There is angular linear lucency in the intertrochanteric/ sub trochanteric region of the left hip with slight cortical irregularity suggested. This probably represents nondisplaced intertrochanteric fracture of the left hip. Dedicated left hip views would be useful for better evaluation. Multiple calcified phleboliths in the pelvis. Vascular calcifications.  IMPRESSION: Probable nondisplaced intertrochanteric fracture of the left hip.   Electronically Signed   By: Lucienne Capers  M.D.   On: 05/28/2014 06:53   Dg Femur Left  05/28/2014   CLINICAL DATA:  Left hip pain after a fall.  EXAM: LEFT FEMUR - 2 VIEW  COMPARISON:  11/19/2009  FINDINGS: Angular lucencies in the inter trochanteric/sub trochanteric region of the left proximal femur probably representing nondisplaced fracture. Femoral shaft and metaphysis seal region appear intact. Degenerative changes are present in the hip and knee. Vascular calcifications.  IMPRESSION: Probable acute intertrochanteric fracture left hip without displacement.   Electronically Signed   By: Lucienne Capers M.D.   On: 05/28/2014 06:55   Ct Head Wo Contrast  05/28/2014   CLINICAL DATA:  Headache, fall.  EXAM: CT HEAD WITHOUT CONTRAST  TECHNIQUE: Contiguous axial images were obtained from the base of the skull through the vertex without intravenous contrast.  COMPARISON:  Prior MRI from 08/04/2013 and CT 08/03/2014  FINDINGS: Age-related atrophy with chronic microvascular ischemic changes are seen, not significantly changed relative to prior studies.  No acute intracranial hemorrhage or large vessel territory infarct. No extra-axial fluid collection. No hydrocephalus or midline shift. 2.2 cm ovoid extra-axial lesion at the lateral right cerebellar hemisphere again seen, most compatible with a meningioma.  No calvarial fracture.  Scalp soft tissues within normal limits.  No acute abnormality seen about the orbits.  Visualized paranasal sinuses and mastoid air cells are clear.  IMPRESSION: 1. No acute intracranial process. 2. Advanced age-related atrophy with chronic microvascular ischemic disease. 3. 2.2 cm extra-axial well-circumscribed ovoid mass lateral to the left cerebellar hemisphere, most compatible with a meningioma.   Electronically Signed   By: Marland Kitchen  Jeannine Boga M.D.   On: 05/28/2014 07:01    ROS Blood pressure 126/72, pulse 69, temperature 98.2 F (36.8 C), temperature source Oral, resp. rate 20, height 5' 5"  (1.651 m), weight 119 lb  11.4 oz (54.3 kg), SpO2 98.00%. Physical Exam  Assessment/Plan: The patient has a nondisplaced peritrochanteric fracture left hip. She was on xarelto has now been off of it for 24 hours however her glucose is now elevated.  Most likely the fracture will have to be stabilized.  I will sign the patient off to Dr. Luna Glasgow as I will be out of town.   Arther Abbott 05/29/2014, 1:43 PM

## 2014-05-29 NOTE — Progress Notes (Signed)
Dr. Aline Brochure has seen this patient earlier today.  He is going out of town.  He contacted me this evening.  I have reviewed his notes and have talked to the patient.  She is confused.  She has a fracture of the hip on the left nondisplaced.  She will need a hip compression screw fixation.  She had been on Coumadin and her protime had fallen to near normal value today and should be normal tomorrow morning.  I will plan to do surgery on the left hip tomorrow.  Her family is to give consent.  She is at high risk secondary to her age and confusion.  She will need to use a walker and plan for skilled nursing facility post operative.  Nursing Supervisor to contact family and get consent.  I have written orders.

## 2014-05-29 NOTE — Clinical Documentation Improvement (Signed)
Abnormal findings (laboratory, x-ray, CT scans) are not coded and reported unless the physician indicates their clinical significance. On admission there was noted to be abnormal EKG. If possible, please help by clarifying the clinical significance of the abnormal EKG.   Possible Clinical Conditions?   Left Bundle Branch Block                              Other Condition___________________                 Not Clinically Significant_________   Supporting Information: EKG 05/28/14: Unknown rhythm, irregular rate Borderline prolonged PR interval.Left bundle branch block  Thank You, Hartley Barefoot ,RN Clinical Documentation Specialist:  [place] [facility]

## 2014-05-29 NOTE — Clinical Social Work Psychosocial (Signed)
Clinical Social Work Department BRIEF PSYCHOSOCIAL ASSESSMENT 05/29/2014  Patient:  Alicia Mason, Alicia Mason     Account Number:  [id number]     Admit date:  05/28/2014  Clinical Social Worker:  Alicia Mason Date/Time:  05/29/2014 02:25 PM  Referred by:  Physician  Date Referred:  05/29/2014 Referred for  SNF Placement   Other Referral:   Interview type:  Patient Other interview type:   caregiver- Alicia Mason    PSYCHOSOCIAL DATA Living Status:  ALONE Admitted from facility:   Level of care:   Primary support name:  Alicia Mason Primary support relationship to patient:  FAMILY Degree of support available:   2 granddaughters live out of state    CURRENT CONCERNS Current Concerns  Post-Acute Placement   Other Concerns:    SOCIAL WORK ASSESSMENT / PLAN CSW met with pt and pt's caregiver Alicia Mason at bedside. Pt alert and oriented, but states she is feeling awful. Requests CSW to speak with Alicia Mason for assessment. Alicia Mason reports pt lives alone, but has caregivers from 9-5 seven days a week. Alicia Mason works with her M-F from 9-5 and has been with pt since December. Pt has 2 granddaughters who live in [place]. Pt admitted to hospital after pt fell, fracturing her hip. Alicia Mason states pt was alone and lost her balance trying to get to bathroom. She managed to call for help. Currently awaiting surgery. At baseline, pt is fairly independent with personal care. Caregivers provide assistance with meals and errands. Pt generally manages fine at home at night on her own. CSW discussed d/c plans. Pt desires to return home if at all possible and Alicia Mason states pt could have 24 hour assist if needed. Pt has been to SNF in the past and did not have a good experience. Alicia Mason requests to wait to initiate bed search until next week as pt is still waiting on surgery and definitely prefers to return home. CSW agreed to follow up on Monday to discuss further with pt as she does not feel well today.   Assessment/plan status:  Psychosocial  Support/Ongoing Assessment of Needs Other assessment/ plan:   Information/referral to community resources:   SNF list    PATIENT'S/FAMILY'S RESPONSE TO PLAN OF CARE: Pt did not participate much in conversation, deferring to Alicia Mason at this time. CSW will follow up.       Alicia Mason, Alicia Mason

## 2014-05-29 NOTE — Progress Notes (Signed)
Inpatient Diabetes Program Recommendations  AACE/ADA: New Consensus Statement on Inpatient Glycemic Control (2013)  Target Ranges:  Prepandial:   less than 140 mg/dL      Peak postprandial:   less than 180 mg/dL (1-2 hours)      Critically ill patients:  140 - 180 mg/dL   Results for TANISH, SINKLER (MRN [id number]) as of 05/29/2014 07:50  Ref. Range 05/28/2014 06:02  Glucose Latest Range: 70-99 mg/dL 191 (H)   Diabetes history: DM  Outpatient Diabetes medications: Lantus 10 units HS, Januvia 50 mg QAM  Current orders for Inpatient glycemic control: Lantus 10 units QHS  Inpatient Diabetes Program Recommendations Insulin - Basal: Noted Lantus 10 units QHS is ordered but Lantus was charted as Not Given last night.  Therefore, patient has not received any insulin since being admitted. Correction (SSI): Please consider ordering CBGs with Novolog sensitive correction scale ACHS. Diet: Please consider changing diet from Reg to Carb Modified Diabetic diet.  Note: There are not any orders for CBGs or Novolog correction.  The only glucose level in the chart is 191 mg/dl which is from 05/28/14 @ 6:02 am.  In reviewing the chart, noted that Lantus was not given last night (documented reason, order parameters not met).  Reviewed Lantus order and there are no parameters noted so not sure why Lantus was not given as ordered.  NURSING: Please be sure to administer basal insulin unless MD gives an order to hold. Recommend ordering CBGs with Novolog sensitive correction scale ACHS and change diet from Regular to Carb Modified Diabetic diet.    Thanks, Barnie Alderman, RN, MSN, CCRN Diabetes Coordinator Inpatient Diabetes Program [phone] (Team Pager) [phone] ([facility] office) [phone] [facility] office)

## 2014-05-29 NOTE — Progress Notes (Signed)
UR chart review completed.  

## 2014-05-30 ENCOUNTER — Encounter (HOSPITAL_COMMUNITY)
Admission: EM | Disposition: A | Discharge status: Skilled Nursing Facility | Payer: Self-pay | Source: Home / Self Care | Attending: Family Medicine

## 2014-05-30 ENCOUNTER — Encounter (HOSPITAL_COMMUNITY): Payer: Medicare Other | Admitting: Anesthesiology

## 2014-05-30 ENCOUNTER — Inpatient Hospital Stay (HOSPITAL_COMMUNITY): Payer: Medicare Other | Admitting: Anesthesiology

## 2014-05-30 ENCOUNTER — Inpatient Hospital Stay (HOSPITAL_COMMUNITY): Payer: Medicare Other

## 2014-05-30 HISTORY — PX: ORIF HIP FRACTURE: SHX2125

## 2014-05-30 LAB — GLUCOSE, CAPILLARY
GLUCOSE-CAPILLARY: 160 mg/dL — AB (ref 70–99)
Glucose-Capillary: 231 mg/dL — ABNORMAL HIGH (ref 70–99)
Glucose-Capillary: 164 mg/dL — ABNORMAL HIGH (ref 70–99)
Glucose-Capillary: 275 mg/dL — ABNORMAL HIGH (ref 70–99)

## 2014-05-30 LAB — CBC
HEMATOCRIT: 32.5 % — AB (ref 36.0–46.0)
Hemoglobin: 11.6 g/dL — ABNORMAL LOW (ref 12.0–15.0)
MCH: 32.2 pg (ref 26.0–34.0)
MCHC: 35.7 g/dL (ref 30.0–36.0)
MCV: 90.3 fL (ref 78.0–100.0)
Platelets: 145 10*3/uL — ABNORMAL LOW (ref 150–400)
RBC: 3.6 MIL/uL — ABNORMAL LOW (ref 3.87–5.11)
RDW: 12.6 % (ref 11.5–15.5)
WBC: 6.9 10*3/uL (ref 4.0–10.5)

## 2014-05-30 LAB — SURGICAL PCR SCREEN
MRSA, PCR: NEGATIVE
Staphylococcus aureus: NEGATIVE

## 2014-05-30 LAB — PROTIME-INR
INR: 1.08 (ref 0.00–1.49)
PROTHROMBIN TIME: 14 s (ref 11.6–15.2)

## 2014-05-30 SURGERY — OPEN REDUCTION INTERNAL FIXATION HIP
Anesthesia: Spinal | Site: Hip | Laterality: Left

## 2014-05-30 SURGERY — CANCELLED PROCEDURE

## 2014-05-30 MED ORDER — EPHEDRINE SULFATE 50 MG/ML IJ SOLN
INTRAMUSCULAR | Status: AC
  Filled 2014-05-30: qty 1

## 2014-05-30 MED ORDER — SODIUM CHLORIDE 0.9 % IR SOLN
Status: DC | PRN
  Administered 2014-05-30: 1000 mL

## 2014-05-30 MED ORDER — SODIUM CHLORIDE 0.9 % IV SOLN
INTRAVENOUS | Status: DC | PRN
  Administered 2014-05-30: 09:00:00 via INTRAVENOUS

## 2014-05-30 MED ORDER — PROPOFOL INFUSION 10 MG/ML OPTIME
INTRAVENOUS | Status: DC | PRN
  Administered 2014-05-30: 25 ug/kg/min via INTRAVENOUS

## 2014-05-30 MED ORDER — FENTANYL CITRATE 0.05 MG/ML IJ SOLN
INTRAMUSCULAR | Status: DC | PRN
  Administered 2014-05-30: 25 ug via INTRAVENOUS
  Administered 2014-05-30: 12.5 ug via INTRAVENOUS
  Administered 2014-05-30 (×2): 25 ug via INTRAVENOUS

## 2014-05-30 MED ORDER — FENTANYL CITRATE 0.05 MG/ML IJ SOLN
INTRAMUSCULAR | Status: DC | PRN
  Administered 2014-05-30: 12.5 ug via INTRATHECAL

## 2014-05-30 MED ORDER — ONDANSETRON HCL 4 MG/2ML IJ SOLN
INTRAMUSCULAR | Status: AC
  Filled 2014-05-30: qty 2

## 2014-05-30 MED ORDER — MIDAZOLAM HCL 5 MG/5ML IJ SOLN
INTRAMUSCULAR | Status: DC | PRN
  Administered 2014-05-30 (×4): 0.5 mg via INTRAVENOUS

## 2014-05-30 MED ORDER — LIDOCAINE HCL (PF) 1 % IJ SOLN
INTRAMUSCULAR | Status: AC
  Filled 2014-05-30: qty 5

## 2014-05-30 MED ORDER — EPHEDRINE SULFATE 50 MG/ML IJ SOLN
INTRAMUSCULAR | Status: DC | PRN
  Administered 2014-05-30 (×6): 5 mg via INTRAVENOUS

## 2014-05-30 MED ORDER — PROPOFOL 10 MG/ML IV BOLUS
INTRAVENOUS | Status: AC
  Filled 2014-05-30: qty 20

## 2014-05-30 MED ORDER — ENOXAPARIN SODIUM 40 MG/0.4ML ~~LOC~~ SOLN
40.0000 mg | SUBCUTANEOUS | Status: DC
  Administered 2014-05-31: 40 mg via SUBCUTANEOUS
  Filled 2014-05-30: qty 0.4

## 2014-05-30 MED ORDER — ALBUTEROL SULFATE (2.5 MG/3ML) 0.083% IN NEBU
2.5000 mg | INHALATION_SOLUTION | Freq: Four times a day (QID) | RESPIRATORY_TRACT | Status: DC
  Administered 2014-05-30 – 2014-06-01 (×7): 2.5 mg via RESPIRATORY_TRACT
  Filled 2014-05-30 (×7): qty 3

## 2014-05-30 MED ORDER — SODIUM CHLORIDE 0.9 % IJ SOLN
INTRAMUSCULAR | Status: AC
  Filled 2014-05-30: qty 10

## 2014-05-30 MED ORDER — MIDAZOLAM HCL 2 MG/2ML IJ SOLN
INTRAMUSCULAR | Status: AC
  Filled 2014-05-30: qty 2

## 2014-05-30 MED ORDER — BUPIVACAINE IN DEXTROSE 0.75-8.25 % IT SOLN
INTRATHECAL | Status: DC | PRN
  Administered 2014-05-30: 15 mg via INTRATHECAL

## 2014-05-30 MED ORDER — ONDANSETRON HCL 4 MG/2ML IJ SOLN
INTRAMUSCULAR | Status: DC | PRN
  Administered 2014-05-30: 4 mg via INTRAVENOUS

## 2014-05-30 MED ORDER — HYDROGEN PEROXIDE 3 % EX SOLN
CUTANEOUS | Status: DC | PRN
  Administered 2014-05-30: 1 via TOPICAL

## 2014-05-30 MED ORDER — FENTANYL CITRATE 0.05 MG/ML IJ SOLN
INTRAMUSCULAR | Status: AC
  Filled 2014-05-30: qty 2

## 2014-05-30 SURGICAL SUPPLY — 57 items
BAG HAMPER (MISCELLANEOUS) ×3 IMPLANT
BIT DRILL TWIST 3.5MM (BIT) ×1 IMPLANT
BLADE 10 SAFETY STRL DISP (BLADE) ×6 IMPLANT
BLADE SURG SZ20 CARB STEEL (BLADE) ×3 IMPLANT
CLOTH BEACON ORANGE TIMEOUT ST (SAFETY) ×3 IMPLANT
COVER LIGHT HANDLE STERIS (MISCELLANEOUS) ×6 IMPLANT
COVER MAYO STAND XLG (DRAPE) ×3 IMPLANT
DRAPE STERI IOBAN 125X83 (DRAPES) ×3 IMPLANT
DRILL TWIST 3.5MM (BIT) ×3
ELECT REM PT RETURN 9FT ADLT (ELECTROSURGICAL) ×3
ELECTRODE REM PT RTRN 9FT ADLT (ELECTROSURGICAL) ×1 IMPLANT
EVACUATOR 3/16  PVC DRAIN (DRAIN) ×2
EVACUATOR 3/16 PVC DRAIN (DRAIN) ×1 IMPLANT
GAUZE SPONGE 4X4 12PLY STRL (GAUZE/BANDAGES/DRESSINGS) ×2 IMPLANT
GAUZE XEROFORM 5X9 LF (GAUZE/BANDAGES/DRESSINGS) ×3 IMPLANT
GLOVE BIO SURGEON STRL SZ7 (GLOVE) ×2 IMPLANT
GLOVE BIO SURGEON STRL SZ8 (GLOVE) ×3 IMPLANT
GLOVE BIO SURGEON STRL SZ8.5 (GLOVE) ×3 IMPLANT
GLOVE BIOGEL PI IND STRL 7.0 (GLOVE) ×3 IMPLANT
GLOVE BIOGEL PI IND STRL 7.5 (GLOVE) IMPLANT
GLOVE BIOGEL PI INDICATOR 7.0 (GLOVE) ×6
GLOVE BIOGEL PI INDICATOR 7.5 (GLOVE) ×2
GOWN STRL REUS W/TWL LRG LVL3 (GOWN DISPOSABLE) ×6 IMPLANT
GOWN STRL REUS W/TWL XL LVL3 (GOWN DISPOSABLE) ×3 IMPLANT
GUIDE PIN CALIBRATED (PIN) ×3 IMPLANT
GUIDE PIN CALIBRATED 2.4X23 (PIN) ×1 IMPLANT
INST SET MAJOR BONE (KITS) ×3 IMPLANT
KIT BLADEGUARD II DBL (SET/KITS/TRAYS/PACK) ×3 IMPLANT
KIT ROOM TURNOVER AP CYSTO (KITS) ×3 IMPLANT
MANIFOLD NEPTUNE II (INSTRUMENTS) ×3 IMPLANT
MARKER SKIN DUAL TIP RULER LAB (MISCELLANEOUS) ×3 IMPLANT
NS IRRIG 1000ML POUR BTL (IV SOLUTION) ×3 IMPLANT
PACK BASIC III (CUSTOM PROCEDURE TRAY) ×3
PACK SRG BSC III STRL LF ECLPS (CUSTOM PROCEDURE TRAY) ×1 IMPLANT
PAD ABD 5X9 TENDERSORB (GAUZE/BANDAGES/DRESSINGS) ×5 IMPLANT
PAD ARMBOARD 7.5X6 YLW CONV (MISCELLANEOUS) ×3 IMPLANT
PENCIL HANDSWITCHING (ELECTRODE) ×3 IMPLANT
PLATE SHORT BARREL 135X4 (Plate) ×3 IMPLANT
PLATE SHORT BARRELL 145X4 (Plate) ×2 IMPLANT
SCREW CORTICAL SFTP 4.5X38MM (Screw) ×6 IMPLANT
SCREW CORTICAL SFTP 4.5X40MM (Screw) ×2 IMPLANT
SCREW CORTICAL SFTP 4.5X42MM (Screw) ×3 IMPLANT
SCREW LAG 85MM (Screw) ×3 IMPLANT
SCREW LAG 90MM (Screw) ×3 IMPLANT
SCREW LAGSTD 85X21X12.7X9 (Screw) ×1 IMPLANT
SCREW LAGSTD 90X21X12.7X9 (Screw) IMPLANT
SET BASIN LINEN APH (SET/KITS/TRAYS/PACK) ×3 IMPLANT
SPONGE DRAIN TRACH 4X4 STRL 2S (GAUZE/BANDAGES/DRESSINGS) ×3 IMPLANT
SPONGE GAUZE 4X4 12PLY (GAUZE/BANDAGES/DRESSINGS) ×3 IMPLANT
SPONGE LAP 18X18 X RAY DECT (DISPOSABLE) ×6 IMPLANT
STAPLER VISISTAT 35W (STAPLE) ×3 IMPLANT
SUT BRALON NAB BRD #1 30IN (SUTURE) ×10 IMPLANT
SUT PLAIN 2 0 XLH (SUTURE) ×5 IMPLANT
SUT SILK 0 FSL (SUTURE) ×3 IMPLANT
SYR BULB IRRIGATION 50ML (SYRINGE) ×3 IMPLANT
TAPE MEDIFIX FOAM 3 (GAUZE/BANDAGES/DRESSINGS) ×3 IMPLANT
YANKAUER SUCT 12FT TUBE ARGYLE (SUCTIONS) ×3 IMPLANT

## 2014-05-30 SURGICAL SUPPLY — 41 items
BAG HAMPER (MISCELLANEOUS) ×4 IMPLANT
BIT DRILL 6.5 DISP STRL (BIT) ×3 IMPLANT
BLADE 10 SAFETY STRL DISP (BLADE) ×8 IMPLANT
BLADE SURG SZ20 CARB STEEL (BLADE) ×4 IMPLANT
CLOTH BEACON ORANGE TIMEOUT ST (SAFETY) ×3 IMPLANT
COVER LIGHT HANDLE STERIS (MISCELLANEOUS) ×6 IMPLANT
COVER MAYO STAND XLG (DRAPE) ×3 IMPLANT
DRAPE STERI IOBAN 125X83 (DRAPES) ×3 IMPLANT
DRILL OMEGA BONE (BIT) ×4 IMPLANT
ELECT REM PT RETURN 9FT ADLT (ELECTROSURGICAL) ×3
ELECTRODE REM PT RTRN 9FT ADLT (ELECTROSURGICAL) ×2 IMPLANT
EVACUATOR 3/16  PVC DRAIN (DRAIN) ×2
EVACUATOR 3/16 PVC DRAIN (DRAIN) ×1 IMPLANT
GAUZE SPONGE 4X4 12PLY STRL (GAUZE/BANDAGES/DRESSINGS) ×3 IMPLANT
GAUZE XEROFORM 5X9 LF (GAUZE/BANDAGES/DRESSINGS) ×4 IMPLANT
GLOVE BIO SURGEON STRL SZ8 (GLOVE) ×4 IMPLANT
GLOVE BIO SURGEON STRL SZ8.5 (GLOVE) ×4 IMPLANT
GOWN STRL REUS W/TWL LRG LVL3 (GOWN DISPOSABLE) ×12 IMPLANT
GOWN STRL REUS W/TWL XL LVL3 (GOWN DISPOSABLE) ×4 IMPLANT
GUIDE PIN CALIBRATED (PIN) ×3 IMPLANT
INST SET MAJOR BONE (KITS) ×4 IMPLANT
KIT BLADEGUARD II DBL (SET/KITS/TRAYS/PACK) ×3 IMPLANT
KIT ROOM TURNOVER AP CYSTO (KITS) ×4 IMPLANT
MANIFOLD NEPTUNE II (INSTRUMENTS) ×3 IMPLANT
MARKER SKIN DUAL TIP RULER LAB (MISCELLANEOUS) ×3 IMPLANT
NS IRRIG 1000ML POUR BTL (IV SOLUTION) ×4 IMPLANT
PACK BASIC III (CUSTOM PROCEDURE TRAY) ×3
PACK SRG BSC III STRL LF ECLPS (CUSTOM PROCEDURE TRAY) ×2 IMPLANT
PAD ABD 5X9 TENDERSORB (GAUZE/BANDAGES/DRESSINGS) ×4 IMPLANT
PAD ARMBOARD 7.5X6 YLW CONV (MISCELLANEOUS) ×3 IMPLANT
PENCIL HANDSWITCHING (ELECTRODE) ×3 IMPLANT
SET BASIN LINEN APH (SET/KITS/TRAYS/PACK) ×4 IMPLANT
SPONGE LAP 18X18 X RAY DECT (DISPOSABLE) ×8 IMPLANT
STAPLER VISISTAT 35W (STAPLE) ×4 IMPLANT
SUT BRALON NAB BRD #1 30IN (SUTURE) ×8 IMPLANT
SUT PLAIN 2 0 XLH (SUTURE) ×4 IMPLANT
SUT SILK 0 FSL (SUTURE) ×4 IMPLANT
SYR BULB IRRIGATION 50ML (SYRINGE) ×3 IMPLANT
TAPE MEDIFIX FOAM 3 (GAUZE/BANDAGES/DRESSINGS) ×3 IMPLANT
YANKAUER SUCT 12FT TUBE ARGYLE (SUCTIONS) ×4 IMPLANT
YANKAUER SUCT BULB TIP 10FT TU (MISCELLANEOUS) ×4 IMPLANT

## 2014-05-30 NOTE — Addendum Note (Signed)
Addendum created 05/30/14 1735 by Vista Deck, CRNA   Modules edited: Anesthesia Events

## 2014-05-30 NOTE — Anesthesia Postprocedure Evaluation (Signed)
  Anesthesia Post-op Note  Patient: Alicia Mason  Procedure(s) Performed: Procedure(s): COMPRESSION HIP SCREW LEFT HIP (Left)  Patient Location: PACU  Anesthesia Type:Spinal  Level of Consciousness: awake and patient cooperative  Airway and Oxygen Therapy: Patient Spontanous Breathing  Post-op Pain: none  Post-op Assessment: Post-op Vital signs reviewed, PATIENT'S CARDIOVASCULAR STATUS UNSTABLE, Respiratory Function Stable, Patent Airway and Pain level controlled  Post-op Vital Signs: Reviewed and stable  Last Vitals:  Filed Vitals:   05/30/14 1134  BP: 149/62  Pulse: 75  Temp: 36.5 C  Resp: 14    Complications: No apparent anesthesia complications Nausea resolved

## 2014-05-30 NOTE — Op Note (Signed)
Alicia Mason, Alicia Mason               ACCOUNT NO.:  [id number]  MEDICAL RECORD NO.:  [id number]  LOCATION:  [facility]PO                          FACILITY:  [facility]  PHYSICIAN:  J. Sanjuana Kava, M.D. DATE OF BIRTH:  [birth year]  DATE OF PROCEDURE: DATE OF DISCHARGE:                              OPERATIVE REPORT   PREOPERATIVE DIAGNOSIS:  Fracture of the left proximal femur, intertrochanteric type.  POSTOPERATIVE DIAGNOSIS:  Fracture of the left proximal femur, intertrochanteric type.  PROCEDURE:  Open treatment and internal reduction of left hip fracture using a Richards hip compression screw with 135 degree angle, short barrel 4 hole side plate, and an 80-mm compression screw.  Compression applied to the system.  SURGEON:  J. Sanjuana Kava, M.D.  DRAINS:  One large Hemovac.  ANESTHESIA:  Spinal.  INDICATIONS:  The patient is an elderly 80 year old female who fell and injured her left hip.  X-rays show an incomplete intertrochanteric fracture of left hip.  The patient is very confused.  She is at high risk for the procedure but medically stable.  DESCRIPTION OF PROCEDURE:  The patient was seen in the holding area. The left hip was identified as correct surgical site.  I placed a mark on the left hip area.  She was brought to the operating room, given spinal anesthesia and then positioned on the fracture table and the C- arm fluoroscopy image brought in.  Everyone had on lead aprons, lead shields, appropriate badges.  X-rays were taken which showed good position and alignment.  The patient was then prepped and draped in the usual manner.  We had a generalized time-out identifying the patient as Ms. Almond, everyone in the room knew each other.  All instrumentation was properly positioned and working, and the C-arm was working.  An incision was made through skin, subcutaneous tissue, tensor fascia lata, vastus lateralis.  Femoral shaft was identified.  Guide pin was placed.  This  looked good in AP and lateral views.  I selected a 90-mm compression screw.  Step drill was used.  In the process of trying to insert a 90 mm compression screw, I could not get it all the way up in the femoral head as I desired.  I did not know if it was a problem with the screw or not.  So, I selected an 85 mm compression screw and redrilled the femoral head area.  I applied an 85 mm compression screw which just got an area where it just would not turn and go anywhere.  I removed this.  I then used the tap over the compression screw, I put that in and I took bone fragments and material that was removed around the tap from the femoral head had an area that was very soft and then very hard.  I was concerned whether there could be some type of lesion that cannot be appreciated fully on the x-ray.  This has gone to Pathology.  I did get good purchase with 85 mm screw after using the tap.  A 135-degree side plate was used.  Originally had a 145, but changed to 135.  Screws were then inserted under compression, first one was 40 and 42,  and then two 38s distally.  Permanent x-rays taken looked good, position and alignment looked good in both AP and lateral views. Hemovac drain was placed, sewn in with 2-0 silk suture.  The vastus lateralis reapproximated using running #1 Surgilon suture with locking. The tensor fascia lata reapproximated using interrupted figure-of-eight sutures of #1 Bralon suture.  A 2-0 plain was used for subcutaneous tissue and skin was reapproximated with skin staples.  Sterile dressing applied.  The patient tolerated the procedure well.  She lost approximately 150-200 mL of blood.  None replaced. She will go back to the floor later after the postsurgical recovery.          ______________________________ Lenna Sciara. Sanjuana Kava, M.D.     JWK/MEDQ  D:  05/30/2014  T:  05/30/2014  Job:  [id number]

## 2014-05-30 NOTE — Anesthesia Procedure Notes (Addendum)
Procedure Name: MAC Date/Time: 05/30/2014 9:20 AM Performed by: Vista Deck Pre-anesthesia Checklist: Patient identified, Emergency Drugs available, Suction available, Timeout performed and Patient being monitored Patient Re-evaluated:Patient Re-evaluated prior to inductionOxygen Delivery Method: Non-rebreather mask    Spinal  Patient location during procedure: OR Start time: 05/30/2014 9:29 AM End time: 05/30/2014 9:36 AM Staffing CRNA/Resident: Drucie Opitz S Preanesthetic Checklist Completed: patient identified, site marked, surgical consent, pre-op evaluation, timeout performed, IV checked, risks and benefits discussed and monitors and equipment checked Spinal Block Patient position: left lateral decubitus Prep: Betadine Patient monitoring: heart rate, cardiac monitor, continuous pulse ox and blood pressure Approach: left paramedian Location: L3-4 Injection technique: single-shot Needle Needle type: Spinocan  Needle gauge: 22 G Needle length: 9 cm Assessment Sensory level: T6 Additional Notes Betadine prep x3 1% lidocaine skinwheal  Clear CSF pre and post injection  ATTEMPTS: 1 TRAY ID: [id number] TRAY EXPIRATION DATE: 2016-09

## 2014-05-30 NOTE — Anesthesia Preprocedure Evaluation (Addendum)
Anesthesia Evaluation  Patient identified by MRN, date of birth, ID band Patient awake and Patient confused    Reviewed: Allergy & Precautions, H&P , NPO status , Patient's Chart, lab work & pertinent test results, reviewed documented beta blocker date and time   Airway Mallampati: I TM Distance: >3 FB Neck ROM: Full    Dental  (+) Teeth Intact, Dental Advisory Given   Pulmonary  breath sounds clear to auscultation        Cardiovascular Exercise Tolerance: Poor hypertension, + CAD, + Past MI and + CABG Rhythm:Irregular  Afib   Neuro/Psych Anxiety 2014 CVA, No Residual Symptoms    GI/Hepatic GERD-  Controlled,  Endo/Other  diabetes, Type 2  Renal/GU      Musculoskeletal   Abdominal   Peds  Hematology  (+) anemia ,   Anesthesia Other Findings   Reproductive/Obstetrics                          Anesthesia Physical Anesthesia Plan  ASA: III and emergent  Anesthesia Plan: Spinal   Post-op Pain Management:    Induction:   Airway Management Planned: Simple Face Mask  Additional Equipment:   Intra-op Plan:   Post-operative Plan:   Informed Consent:   Dental advisory given  Plan Discussed with: Anesthesiologist and Surgeon  Anesthesia Plan Comments:         Anesthesia Quick Evaluation

## 2014-05-30 NOTE — Transfer of Care (Signed)
Immediate Anesthesia Transfer of Care Note  Patient: Alicia Mason  Procedure(s) Performed: Procedure(s): COMPRESSION HIP SCREW LEFT HIP (Left)  Patient Location: PACU  Anesthesia Type:Spinal  Level of Consciousness: awake and patient cooperative  Airway & Oxygen Therapy: Patient Spontanous Breathing and non-rebreather face mask  Post-op Assessment: Report given to PACU RN and Post -op Vital signs reviewed and stable  Post vital signs: Reviewed and stable  Complications: No apparent anesthesia complications

## 2014-05-30 NOTE — Progress Notes (Signed)
Note: This document was prepared with digital dictation and possible smart phrase technology. Any transcriptional errors that result from this process are unintentional.   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 Aline Mason admitted from home 2?2 to accidental fall   Past medical history-As per Problem list Chart reviewed as below- none  Consultants:  Orthopedics  Procedures:  None yet  Antibiotics:  none   Subjective   Doing well No specific issues just returned from surgery  hungry and would like to have a diet No shortness of breath Pain is moderately controlled    Objective    Interim History:   Telemetry: nsr   Objective: Filed Vitals:   05/30/14 1253 05/30/14 1335 05/30/14 1410 05/30/14 1613  BP: 136/45 135/52 154/56 156/60  Pulse: 63 65 70 78  Temp:  97.9 F (36.6 C) 97.5 F (36.4 C) 98 F (36.7 C)  TempSrc:  Oral Oral Oral  Resp: 18 18 18 18   Height:      Weight:      SpO2: 100% 97% 100% 100%    Intake/Output Summary (Last 24 hours) at 05/30/14 1618 Last data filed at 05/30/14 1238  Gross per 24 hour  Intake    830 ml  Output   1930 ml  Net  -1100 ml    Exam:  General:  appears well  Cardiovascular: s1 s2  no murmur rub or gallop  Respiratory: clear, no added sound Abdomen: soft, NT/ND no rebound or gaurding Skin postop changes noted 2%  Neuro intact  Data Reviewed: Basic Metabolic Panel:  Recent Labs Lab 05/28/14 0602 05/29/14 0753  NA 140 135*  K 3.8 3.7  CL 103 99  CO2 26 26  GLUCOSE 191* 195*  BUN 24* 17  CREATININE 0.78 0.71  CALCIUM 9.1 8.5   Liver Function Tests:  Recent Labs Lab 05/28/14 0650  ALBUMIN 3.7   No  results found for this basename: LIPASE, AMYLASE,  in the last 168 hours No results found for this basename: AMMONIA,  in the last 168 hours CBC:  Recent Labs Lab 05/28/14 0602 05/29/14 0753 05/30/14 0543  WBC 11.7* 7.9 6.9  NEUTROABS 9.9* 6.2  --   HGB 12.3 11.6* 11.6*  HCT 35.3* 32.9* 32.5*  MCV 90.7 90.1 90.3  PLT 164 140* 145*   Cardiac Enzymes: No results found for this basename: CKTOTAL, CKMB, CKMBINDEX, TROPONINI,  in the last 168 hours BNP: No components found with this basename: POCBNP,  CBG:  Recent Labs Lab 05/29/14 1303 05/29/14 1659 05/29/14 2023 05/30/14 0728 05/30/14 1140  GLUCAP 224* 110* 262* 231* 160*    Recent Results (from the past 240 hour(s))  SURGICAL PCR SCREEN     Status: None   Collection Time    05/30/14  5:04 AM      Result Value Ref Range Status   MRSA, PCR NEGATIVE  NEGATIVE Final   Staphylococcus aureus NEGATIVE  NEGATIVE Final   Comment:            The Xpert SA Assay (FDA     approved for NASAL specimens     in patients over 55 years of age),     is one component of  a comprehensive surveillance     program.  Test performance has     been validated by [facility] for patients greater     than or equal to 67 year old.     It is not intended     to diagnose infection nor to     guide or monitor treatment.     Studies:              All Imaging reviewed and is as per above notation   Scheduled Meds: . albuterol  2.5 mg Nebulization Q6H  . [START ON 05/31/2014] enoxaparin (LOVENOX) injection  40 mg Subcutaneous Q24H  . feeding supplement (ENSURE COMPLETE)  237 mL Oral BID BM  . insulin aspart  0-15 Units Subcutaneous TID WC  . insulin aspart  3 Units Subcutaneous TID WC  . insulin glargine  10 Units Subcutaneous QHS  . lisinopril  20 mg Oral Daily  . metoprolol tartrate  50 mg Oral BID  . mirabegron ER  25 mg Oral Daily  . potassium chloride  10 mEq Oral QODAY  . senna  1 tablet Oral BID  . simvastatin  40 mg Oral  q1800  . sodium chloride  3 mL Intravenous Q12H   Continuous Infusions:    Assessment/Plan: Hip fracture-s/p surgery left hip 05/30/14 . Xarelto to be restarted 7/12 . Continue tramadol 50 every 6 when necessary pain-folery oput Day 1 post-op, Clamp catheter.,  Eval by PT HYPERTENSION-continue metoprolol 50 mg twice a day-change to regular formulation Continue quinapril 20 mg daily  Atrial fibrillation-Xarelto on hold. She's not had a stroke since 2014 . Continue metoprolol  50 twice a day for rate control.  H/o NSTEMI (non-ST elevated myocardial infarction)-currently stable at present time-not on antiplatelet agent. Discussion with cardiology needed as outpatient  Diabetes mellitus-continue Lantus 10 each bedtime that time-blood sugars  range between 160-262  Dyslipidemia-continue generic replacement for statin Zocor 40 mg daily  Dermatitis we will hold her ointment   Code Status: full Family Communication:  called Gradndaughter Alicia Mason [phone].  No answer- left vm. Disposition Plan: inpatient   Verneita Griffes, MD  [facility] Pager [phone] 05/30/2014, 4:18 PM    LOS: 2 days

## 2014-05-30 NOTE — Brief Op Note (Signed)
05/28/2014 - 05/30/2014  11:08 AM  PATIENT:  Alicia Mason  80 y.o. female  PRE-OPERATIVE DIAGNOSIS:  Fracture Left Hip  POST-OPERATIVE DIAGNOSIS:  Fracture Left Hip  PROCEDURE:  Procedure(s): COMPRESSION HIP SCREW LEFT HIP (Left)  SURGEON:  Surgeon(s) and Role:    * Sanjuana Kava, MD - Primary  PHYSICIAN ASSISTANT:   ASSISTANTS: none   ANESTHESIA:   spinal  EBL:  Total I/O In: 1300 [I.V.:1300] Out: 50 [Blood:50]  BLOOD ADMINISTERED:none  DRAINS: Penrose drain in the left hip area   LOCAL MEDICATIONS USED:  NONE  SPECIMEN:  Source of Specimen:  femoral head biopsy fragments  DISPOSITION OF SPECIMEN:  PATHOLOGY  COUNTS:  YES  TOURNIQUET:  * No tourniquets in log *  DICTATION: .Other Dictation: Dictation Number [id number]  PLAN OF CARE: Admit to inpatient   PATIENT DISPOSITION:  PACU - hemodynamically stable.   Delay start of Pharmacological VTE agent (>24hrs) due to surgical blood loss or risk of bleeding: no

## 2014-05-31 LAB — CBC WITH DIFFERENTIAL/PLATELET
Basophils Absolute: 0 10*3/uL (ref 0.0–0.1)
Basophils Relative: 0 % (ref 0–1)
EOS ABS: 0.1 10*3/uL (ref 0.0–0.7)
EOS PCT: 1 % (ref 0–5)
HCT: 29.5 % — ABNORMAL LOW (ref 36.0–46.0)
Hemoglobin: 10.5 g/dL — ABNORMAL LOW (ref 12.0–15.0)
LYMPHS ABS: 1.9 10*3/uL (ref 0.7–4.0)
Lymphocytes Relative: 17 % (ref 12–46)
MCH: 31.9 pg (ref 26.0–34.0)
MCHC: 35.6 g/dL (ref 30.0–36.0)
MCV: 89.7 fL (ref 78.0–100.0)
MONO ABS: 1.2 10*3/uL — AB (ref 0.1–1.0)
MONOS PCT: 11 % (ref 3–12)
Neutro Abs: 8 10*3/uL — ABNORMAL HIGH (ref 1.7–7.7)
Neutrophils Relative %: 71 % (ref 43–77)
PLATELETS: 160 10*3/uL (ref 150–400)
RBC: 3.29 MIL/uL — AB (ref 3.87–5.11)
RDW: 12.6 % (ref 11.5–15.5)
WBC: 11.2 10*3/uL — ABNORMAL HIGH (ref 4.0–10.5)

## 2014-05-31 LAB — BASIC METABOLIC PANEL
Anion gap: 11 (ref 5–15)
BUN: 13 mg/dL (ref 6–23)
CALCIUM: 8.5 mg/dL (ref 8.4–10.5)
CO2: 26 mEq/L (ref 19–32)
CREATININE: 0.65 mg/dL (ref 0.50–1.10)
Chloride: 95 mEq/L — ABNORMAL LOW (ref 96–112)
GFR calc Af Amer: 88 mL/min — ABNORMAL LOW (ref >90)
GFR, EST NON AFRICAN AMERICAN: 76 mL/min — AB (ref >90)
GLUCOSE: 251 mg/dL — AB (ref 70–99)
Potassium: 4 mEq/L (ref 3.7–5.3)
SODIUM: 132 meq/L — AB (ref 137–147)

## 2014-05-31 LAB — GLUCOSE, CAPILLARY
GLUCOSE-CAPILLARY: 166 mg/dL — AB (ref 70–99)
GLUCOSE-CAPILLARY: 169 mg/dL — AB (ref 70–99)
Glucose-Capillary: 247 mg/dL — ABNORMAL HIGH (ref 70–99)
Glucose-Capillary: 305 mg/dL — ABNORMAL HIGH (ref 70–99)

## 2014-05-31 MED ORDER — ACETAMINOPHEN-CODEINE #3 300-30 MG PO TABS
1.0000 | ORAL_TABLET | Freq: Four times a day (QID) | ORAL | Status: DC | PRN
  Administered 2014-05-31 – 2014-06-01 (×2): 1 via ORAL
  Filled 2014-05-31 (×2): qty 1

## 2014-05-31 MED ORDER — INSULIN ASPART 100 UNIT/ML ~~LOC~~ SOLN
5.0000 [IU] | Freq: Three times a day (TID) | SUBCUTANEOUS | Status: DC
  Administered 2014-06-01 (×2): 5 [IU] via SUBCUTANEOUS

## 2014-05-31 MED ORDER — RIVAROXABAN 20 MG PO TABS
20.0000 mg | ORAL_TABLET | Freq: Every day | ORAL | Status: DC
  Administered 2014-05-31: 20 mg via ORAL
  Filled 2014-05-31: qty 1

## 2014-05-31 NOTE — Progress Notes (Signed)
Note: This document was prepared with digital dictation and possible smart phrase technology. Any transcriptional errors that result from this process are unintentional.   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 Aline Brochure admitted from home 2?2 to accidental fall. Patient ultimately underwent ORIF Richard hip compression screw  05/30/2014 She had mild os operative delirium probably secondary to underlying dementia worsened by anesthesia.  Past medical history-As per Problem list Chart reviewed as below- none  Consultants:  Orthopedics  Procedures:  None yet  Antibiotics:  none   Subjective   Doing fair Tolerating diet good No stool as yet Confused-thinks that she is in the doctor's office Not sure what city or state she is in.   Objective    Interim History:   Telemetry: nsr   Objective: Filed Vitals:   05/31/14 0400 05/31/14 0456 05/31/14 0733 05/31/14 1300  BP:  151/59  154/58  Pulse:    80  Temp:  98.7 F (37.1 C)  99.8 F (37.7 C)  TempSrc:  Oral  Oral  Resp: 16 18  18   Height:      Weight:      SpO2: 96% 96% 92% 96%    Intake/Output Summary (Last 24 hours) at 05/31/14 1444 Last data filed at 05/31/14 1300  Gross per 24 hour  Intake      0 ml  Output    640 ml  Net   -640 ml    Exam:  General:  appears well  Cardiovascular: s1 s2  no murmur rub or gallop  Respiratory: clear, no added sound Abdomen: soft, NT/ND no rebound or gaurding Skin postop changes  Confused and at baseline  Data Reviewed: Basic Metabolic Panel:  Recent Labs Lab 05/28/14 0602 05/29/14 0753 05/31/14 0548  NA 140 135* 132*  K 3.8 3.7 4.0  CL 103 99 95*  CO2 26 26 26     GLUCOSE 191* 195* 251*  BUN 24* 17 13  CREATININE 0.78 0.71 0.65  CALCIUM 9.1 8.5 8.5   Liver Function Tests:  Recent Labs Lab 05/28/14 0650  ALBUMIN 3.7   No results found for this basename: LIPASE, AMYLASE,  in the last 168 hours No results found for this basename: AMMONIA,  in the last 168 hours CBC:  Recent Labs Lab 05/28/14 0602 05/29/14 0753 05/30/14 0543 05/31/14 0548  WBC 11.7* 7.9 6.9 11.2*  NEUTROABS 9.9* 6.2  --  8.0*  HGB 12.3 11.6* 11.6* 10.5*  HCT 35.3* 32.9* 32.5* 29.5*  MCV 90.7 90.1 90.3 89.7  PLT 164 140* 145* 160   Cardiac Enzymes: No results found for this basename: CKTOTAL, CKMB, CKMBINDEX, TROPONINI,  in the last 168 hours BNP: No components found with this basename: POCBNP,  CBG:  Recent Labs Lab 05/30/14 1140 05/30/14 1611 05/30/14 2022 05/31/14 0751 05/31/14 1129  GLUCAP 160* 164* 275* 247* 305*    Recent Results (from the past 240 hour(s))  SURGICAL PCR SCREEN     Status: None   Collection Time    05/30/14  5:04 AM      Result Value Ref Range Status   MRSA, PCR NEGATIVE  NEGATIVE Final   Staphylococcus aureus NEGATIVE  NEGATIVE Final   Comment:  The Xpert SA Assay (FDA     approved for NASAL specimens     in patients over 61 years of age),     is one component of     a comprehensive surveillance     program.  Test performance has     been validated by [employer] for patients greater     than or equal to 62 year old.     It is not intended     to diagnose infection nor to     guide or monitor treatment.     Studies:              All Imaging reviewed and is as per above notation   Scheduled Meds: . albuterol  2.5 mg Nebulization Q6H  . enoxaparin (LOVENOX) injection  40 mg Subcutaneous Q24H  . feeding supplement (ENSURE COMPLETE)  237 mL Oral BID BM  . insulin aspart  0-15 Units Subcutaneous TID WC  . insulin aspart  3 Units Subcutaneous TID WC  . insulin glargine  10 Units Subcutaneous QHS  .  lisinopril  20 mg Oral Daily  . metoprolol tartrate  50 mg Oral BID  . mirabegron ER  25 mg Oral Daily  . potassium chloride  10 mEq Oral QODAY  . senna  1 tablet Oral BID  . simvastatin  40 mg Oral q1800  . sodium chloride  3 mL Intravenous Q12H   Continuous Infusions:    Assessment/Plan: Hip fracture-s/p surgery left hip 05/30/14 .  - Xarelto to be restarted 7/12 . Continue tramadol 50 every 6 when necessary pain-foley oput Day 1 post-op, Clamp catheter,  Eval by PT in process Mild delirium  -discontinue Phenergan, add Tylenol No. 3 every 4 when necessary and discontinue tramadol q. 6 when necessary 50 mg HYPERTENSION   -continue metoprolol 50 mg twice a day-change to regular formulation. Continue quinapril 20 mg daily  Atrial fibrillation with history of embolic stroke 1572   -Xarelto 20 mg restarted 05/31/49  H/o NSTEMI (non-ST elevated myocardial infarction)-  -currently stable at present time-not on antiplatelet agent. Discussion with cardiology needed as outpatient  Diabetes mellitus   -continue Lantus 10 each bedtime that time-blood sugars  range between  247 and 305-will increase her Mealtime insulin up to 5 units  Dyslipidemia  -continue generic replacement for statin Zocor 40 mg daily  Dermatitis we will hold her ointment   Code Status: full Family Communication:   discussed with  Radford Pax [phone] on 7/12  Disposition Plan: inpatientLikely discharge to a skilled facility tomorrow   Verneita Griffes, MD  [facility] Pager [phone] 05/31/2014, 2:44 PM    LOS: 3 days

## 2014-05-31 NOTE — Anesthesia Postprocedure Evaluation (Signed)
  Anesthesia Post-op Note  Patient: Alicia Mason  Procedure(s) Performed: Procedure(s): COMPRESSION HIP SCREW LEFT HIP (Left)  Patient Location: PACU  Anesthesia Type:General  Level of Consciousness: awake and patient cooperative  Airway and Oxygen Therapy: Patient Spontanous Breathing  Post-op Pain: none  Post-op Assessment: Post-op Vital signs reviewed, Patient's Cardiovascular Status Stable, Respiratory Function Stable, No signs of Nausea or vomiting and Pain level controlled  Post-op Vital Signs: Reviewed and stable    Complications: No apparent anesthesia complications

## 2014-05-31 NOTE — Addendum Note (Signed)
Addendum created 05/31/14 1056 by Vista Deck, CRNA   Modules edited: Notes Section   Notes Section:  File: [id number]

## 2014-05-31 NOTE — Evaluation (Signed)
Physical Therapy Evaluation Patient Details Name: Alicia Mason MRN: [id number] DOB: 10-31-1922 Today's Date: 05/31/2014   History of Present Illness  Patient admitted to hospital followign a fall with severe Lt hip pain. Patient seen today s/p Lt hip ORIF.   Clinical Impression  Assesssment limited secondary to patient having severe 10/10 pain with Lt LE movement of any kind in any direction. Patient required total assist with bed mobility secondary to pain and denied sitting at edge of bed due to pain. Patient has no pain at rest. Following taking pain medication per nurse patient continued to have severe pain. Patient was able to perform all exercises with Lt LE with moderate verbal and tactile cuing due to possible confusion secondary to anesthesia from surgery. Expecting patient will need to stay in hospital and be further monitored before being discharged to SNF due to inability to move and weight bear Lt LE. Patient also inappropriate for home discharge due to lack of home assistance.  Physical therapy to attempt therapy tomorrow to progress bed mobility and attempt gait. Expecting patient to be able to sit at edge of bed tomorrow as confusion and mental status improve.     Follow Up Recommendations SNF    Equipment Recommendations  Rolling walker with 5" wheels       Precautions / Restrictions Precautions Precautions: Other (comment) Precaution Comments: Lt hip toe touch weight bearing Restrictions Weight Bearing Restrictions: Yes LLE Weight Bearing: Touchdown weight bearing      Mobility  Bed Mobility Overal bed mobility: Needs Assistance Bed Mobility: Supine to Sit     Supine to sit: Total assist     General bed mobility comments: unable to perform secondary to pain  Transfers Overall transfer level: Needs assistance Equipment used: Rolling walker (2 wheeled) Transfers: Sit to/from Stand Sit to Stand: Total assist         General transfer comment: requires  assistance for cuing and completion of activitiey secondary to weakness and pain, Was unable to perform this session due to pain.   Ambulation/Gait             General Gait Details: unable to assess due to pain     Balance Overall balance assessment: Needs assistance Sitting-balance support: Bilateral upper extremity supported;Feet supported Sitting balance-Leahy Scale: Poor                                       Pertinent Vitals/Pain Pain 10/10 after taing medication with movement of [place] expects to be discharged to:: Skilled nursing facility Living Arrangements: Other (Comment)               Additional Comments: home with aid during the day    Prior Function Level of Independence: Needs assistance   Gait / Transfers Assistance Needed: quadcain or walker based on sdistance  ADL's / Homemaking Assistance Needed: all home making tasks           Extremity/Trunk Assessment   Upper Extremity Assessment: Overall WFL for tasks assessed           Lower Extremity Assessment: Generalized weakness;LLE deficits/detail   LLE Deficits / Details: Lt LE 2/5 MMT, Rt LE 3/5 MMT     Communication   Communication: No difficulties (displays mild cognitive impairmens and confusion following surgery)  Cognition Arousal/Alertness: Suspect due to medications Behavior During Therapy: [facility] for tasks  assessed/performed Overall Cognitive Status: Impaired/Different from baseline Area of Impairment: Orientation;Following commands;Problem solving Orientation Level: Person;Place   Memory: Decreased recall of precautions Following Commands: Follows one step commands inconsistently     Problem Solving: Slow processing         Exercises General Exercises - Lower Extremity Ankle Circles/Pumps: AROM;20 reps;Supine Heel Slides: Supine;AROM;20 reps Hip ABduction/ADduction: AROM;Supine;20 reps Straight Leg Raises: AROM;Supine;10  reps         PT Assessment Patient needs continued PT services  PT Diagnosis Difficulty walking;Generalized weakness;Altered mental status   PT Problem List Decreased strength;Decreased range of motion;Decreased activity tolerance;Decreased balance;Decreased mobility;Pain;Decreased knowledge of use of DME  PT Treatment Interventions Gait training;Therapeutic activities;Therapeutic exercise;Balance training;Patient/family education   PT Goals (Current goals can be found in the Care Plan section) Acute Rehab PT Goals Patient Stated Goal: To be bale to sit up at edge up bed and perform supine to sit bed mobility with less pain. PT Goal Formulation: With patient Time For Goal Achievement: 06/07/14 Potential to Achieve Goals: Fair    Frequency 7X/week   Barriers to discharge Inaccessible home environment;Decreased caregiver support Patient will require 24 hour supervision and assistance as patient lives by herself and has no one available to provide 24hour support       End of Session Equipment Utilized During Treatment: Gait belt Activity Tolerance: Patient limited by pain Patient left: in bed;with call bell/phone within reach;with bed alarm set;with nursing/sitter in room Nurse Communication: Mobility status;Patient requests pain meds;Weight bearing status         Time: 1435-1515 PT Time Calculation (min): 40 min   Charges:   PT Evaluation $Initial PT Evaluation Tier I: 1 Procedure PT Treatments $Therapeutic Exercise: 8-22 mins $Therapeutic Activity: 8-22 mins        Emaley Applin R 05/31/2014, 3:18 PM

## 2014-05-31 NOTE — Progress Notes (Signed)
Subjective: 1 Day Post-Op Procedure(s) (LRB): COMPRESSION HIP SCREW LEFT HIP (Left) Patient reports pain as mild.    Objective: Vital signs in last 24 hours: Temp:  [97.5 F (36.4 C)-98.9 F (37.2 C)] 98.7 F (37.1 C) (07/12 0456) Pulse Rate:  [63-78] 77 (07/11 2222) Resp:  [16-18] 18 (07/12 0456) BP: (129-168)/(45-77) 151/59 mmHg (07/12 0456) SpO2:  [92 %-100 %] 92 % (07/12 0733)  Intake/Output from previous day: 07/11 0701 - 07/12 0700 In: 830 [P.O.:30; I.V.:800] Out: 960 [Urine:830; Drains:30; Blood:100] Intake/Output this shift:     Recent Labs  05/29/14 0753 05/30/14 0543 05/31/14 0548  HGB 11.6* 11.6* 10.5*    Recent Labs  05/30/14 0543 05/31/14 0548  WBC 6.9 11.2*  RBC 3.60* 3.29*  HCT 32.5* 29.5*  PLT 145* 160    Recent Labs  05/29/14 0753 05/31/14 0548  NA 135* 132*  K 3.7 4.0  CL 99 95*  CO2 26 26  BUN 17 13  CREATININE 0.71 0.65  GLUCOSE 195* 251*  CALCIUM 8.5 8.5    Recent Labs  05/29/14 0753 05/30/14 0543  INR 1.19 1.08    Neurovascular intact Sensation intact distally Intact pulses distally Dorsiflexion/Plantar flexion intact  She had a good night.  She is alert but pleasantly confused.  She has just mild pain.  She is eating well.  PT has not seen her yet.  Assessment/Plan: 1 Day Post-Op Procedure(s) (LRB): COMPRESSION HIP SCREW LEFT HIP (Left) Up with therapy  Trace Wirick 05/31/2014, 12:25 PM

## 2014-06-01 ENCOUNTER — Inpatient Hospital Stay
Admission: RE | Admit: 2014-06-01 | Discharge: 2014-08-05 | Disposition: A | Discharge status: Home / Self Care | Payer: Medicare Other | Source: Ambulatory Visit | Attending: Internal Medicine | Admitting: Internal Medicine

## 2014-06-01 ENCOUNTER — Encounter (HOSPITAL_COMMUNITY): Payer: Self-pay | Admitting: Orthopaedic Surgery

## 2014-06-01 DIAGNOSIS — S72002D Fracture of unspecified part of neck of left femur, subsequent encounter for closed fracture with routine healing: Secondary | ICD-10-CM

## 2014-06-01 DIAGNOSIS — W19XXXA Unspecified fall, initial encounter: Principal | ICD-10-CM

## 2014-06-01 LAB — CBC WITH DIFFERENTIAL/PLATELET
BASOS ABS: 0 10*3/uL (ref 0.0–0.1)
Basophils Relative: 0 % (ref 0–1)
EOS ABS: 0.1 10*3/uL (ref 0.0–0.7)
Eosinophils Relative: 1 % (ref 0–5)
HCT: 26.4 % — ABNORMAL LOW (ref 36.0–46.0)
Hemoglobin: 9.5 g/dL — ABNORMAL LOW (ref 12.0–15.0)
LYMPHS PCT: 16 % (ref 12–46)
Lymphs Abs: 1.4 10*3/uL (ref 0.7–4.0)
MCH: 32.4 pg (ref 26.0–34.0)
MCHC: 36 g/dL (ref 30.0–36.0)
MCV: 90.1 fL (ref 78.0–100.0)
Monocytes Absolute: 1 10*3/uL (ref 0.1–1.0)
Monocytes Relative: 11 % (ref 3–12)
NEUTROS PCT: 72 % (ref 43–77)
Neutro Abs: 6.4 10*3/uL (ref 1.7–7.7)
PLATELETS: 152 10*3/uL (ref 150–400)
RBC: 2.93 MIL/uL — ABNORMAL LOW (ref 3.87–5.11)
RDW: 12.7 % (ref 11.5–15.5)
WBC: 9 10*3/uL (ref 4.0–10.5)

## 2014-06-01 LAB — BASIC METABOLIC PANEL
ANION GAP: 10 (ref 5–15)
BUN: 19 mg/dL (ref 6–23)
CO2: 28 mEq/L (ref 19–32)
Calcium: 8.4 mg/dL (ref 8.4–10.5)
Chloride: 96 mEq/L (ref 96–112)
Creatinine, Ser: 0.77 mg/dL (ref 0.50–1.10)
GFR calc Af Amer: 83 mL/min — ABNORMAL LOW (ref >90)
GFR, EST NON AFRICAN AMERICAN: 72 mL/min — AB (ref >90)
GLUCOSE: 192 mg/dL — AB (ref 70–99)
POTASSIUM: 4.1 meq/L (ref 3.7–5.3)
SODIUM: 134 meq/L — AB (ref 137–147)

## 2014-06-01 LAB — TYPE AND SCREEN
ABO/RH(D): O NEG
Antibody Screen: NEGATIVE
Unit division: 0
Unit division: 0

## 2014-06-01 LAB — GLUCOSE, CAPILLARY
GLUCOSE-CAPILLARY: 313 mg/dL — AB (ref 70–99)
Glucose-Capillary: 177 mg/dL — ABNORMAL HIGH (ref 70–99)

## 2014-06-01 MED ORDER — INSULIN ASPART 100 UNIT/ML ~~LOC~~ SOLN: 5.0000 [IU] | Freq: Three times a day (TID) | SUBCUTANEOUS | Status: DC

## 2014-06-01 MED ORDER — ALBUTEROL SULFATE (2.5 MG/3ML) 0.083% IN NEBU: 2.5000 mg | INHALATION_SOLUTION | RESPIRATORY_TRACT | Status: DC | PRN

## 2014-06-01 MED ORDER — METOPROLOL TARTRATE 50 MG PO TABS
50.0000 mg | ORAL_TABLET | Freq: Two times a day (BID) | ORAL | Status: DC
Start: 2014-06-01 — End: 2016-08-08

## 2014-06-01 MED ORDER — ACETAMINOPHEN-CODEINE #3 300-30 MG PO TABS: 1.0000 | ORAL_TABLET | Freq: Four times a day (QID) | ORAL | Status: DC | PRN

## 2014-06-01 MED ORDER — SENNA 8.6 MG PO TABS: 1.0000 | ORAL_TABLET | Freq: Two times a day (BID) | ORAL | Status: DC

## 2014-06-01 MED ORDER — BISACODYL 10 MG RE SUPP: 10.0000 mg | Freq: Every day | RECTAL | Status: DC | PRN

## 2014-06-01 MED ORDER — ALPRAZOLAM 0.5 MG PO TABS: ORAL_TABLET | ORAL | Status: DC

## 2014-06-01 NOTE — Discharge Summary (Signed)
Physician Discharge Summary  Alicia Mason WUJ:[id number] DOB: Apr 30, 1922 DOA: 05/28/2014  PCP: Glo Herring., MD  Admit date: 05/28/2014 Discharge date: 06/01/2014  Time spent: 40 minutes  Recommendations for Outpatient Follow-up:   1. Will need skilled nursing care on discharge to assist with ambulation and recovery from hip fracture he 2. Slightly anemic during hospital stay - had hip surgery and was on anticoagulant. Recommend repeat CBC as well as basic metabolic panel in about one to 2 days 3. Recommend not using anything stronger than Tylenol No. 3 given she gets confused on opiates and tramadol Could use nonsteroidals as well? caution with anticholinergic/antihistamine medications such Phenergan  4. Continue to  Reorient as she has some underlying memory loss  5.  she had severe protein energy malnutrition and will need supplements 6. Her dermatitis cream should be restarted as an outpatient 7. Consider discussion with cardiology regarding antiplatelet therapy with aspirin in addition to xarelto 8. 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 Aline Brochure admitted from home 2?2 to accidental fall.  Patient ultimately underwent  ORIF Richard hip compression screw 05/30/2014  She had mild os operative delirium probably secondary to underlying dementia worsened by anesthesia.   Hospital Course:    Hip fracture-s/p surgery left hip 05/30/14 .   - Xarelto to be restarted 7/12 . Continue tramadol 50 every 6 when necessary pain-foley oput Day 1  post-op, Clamp catheter, Eval by PT Recommended skilled nursing care was 5 and 1 rolling  Metabolic encephalopathy secondary to medications   -discontinued Phenergan, add Tylenol No. 3 every 4 when necessary and discontinue tramadol q. 6 when  necessary 50 mg  HYPERTENSION   -continue metoprolol 50 mg twice a day-change to regular formulation. Continue quinapril 20 mg daily   Atrial fibrillation with history of embolic stroke 9562   -Xarelto 20 mg restarted 05/31/14  H/o NSTEMI (non-ST elevated myocardial infarction)-   -currently stable at present time-not on antiplatelet agent. Discussion with cardiology needed as outpatient  H/o Diabetes mellitus   -continue Lantus 10 each bedtime that time-blood sugars  did range in the 300s therefore we did  increase mealtime coverage to 5 units 3 times a day with meals. This will need to be readjusted in the  outpatient setting  Dyslipidemia   -continue generic replacement for statin Zocor 40 mg daily  Dermatitis  - we will hold her ointment   Procedures:  hip surgery   05/30/14  Consultations:   orthopedics   Discharge Exam: Filed Vitals:   06/01/14 0400  BP:   Pulse:   Temp:   Resp: 16    General:  alert pleasant oriented with no apparent distress  less confused than prior  Cardiovascular:  S1-S2 no murmur rub or gallop  Respiratory:  clinically clear   Discharge Instructions You were cared for by a hospitalist during your hospital stay. If you have any questions about your discharge medications or the care you received while you were in the hospital after you are discharged, you can call the unit and asked to speak with the  hospitalist on call if the hospitalist that took care of you is not available. Once you are discharged, your primary care physician will handle any further medical issues. Please note that NO REFILLS for any discharge medications will be authorized once you are discharged, as it is imperative that you return to your primary care physician (or establish a relationship with a primary care physician if you do not have one) for your aftercare needs so that they can reassess your need for medications and monitor your lab values.  Discharge Instructions   Diet - low sodium heart healthy    Complete by:  As directed      Increase activity slowly    Complete by:  As directed             Medication List    STOP taking these medications       cetirizine 10 MG tablet  Commonly known as:  ZYRTEC     metoprolol succinate 50 MG 24 hr tablet  Commonly known as:  TOPROL-XL      TAKE these medications       acetaminophen-codeine 300-30 MG per tablet  Commonly known as:  TYLENOL #3  Take 1 tablet by mouth every 6 (six) hours as needed for moderate pain.     ALPRAZolam 0.5 MG tablet  Commonly known as:  XANAX  Take one tablet by mouth every 8 hours as needed for anxiety     bisacodyl 10 MG suppository  Commonly known as:  DULCOLAX  Place 1 suppository (10 mg total) rectally daily as needed for moderate constipation.     insulin aspart 100 UNIT/ML injection  Commonly known as:  novoLOG  Inject 5 Units into the skin 3 (three) times daily with meals.     insulin glargine 100 UNIT/ML injection  Commonly known as:  LANTUS  Inject 10 Units into the skin at bedtime.     metoprolol 50 MG tablet  Commonly known as:  LOPRESSOR  Take 1 tablet (50 mg total) by mouth 2 (two) times daily.     mirabegron ER 25 MG Tb24 tablet  Commonly known as:  MYRBETRIQ  Take 1 tablet (25 mg total) by mouth daily.     potassium chloride 10 MEQ tablet  Commonly known as:  K-DUR  Take 10 mEq by mouth every other  day.     pravastatin 10 MG tablet  Commonly known as:  PRAVACHOL  Take 10 mg by mouth every morning.     Rivaroxaban 15 MG Tabs tablet  Commonly known as:  XARELTO  Take 1 tablet (15 mg total) by mouth daily before breakfast.     senna 8.6 MG Tabs tablet  Commonly known as:  SENOKOT  Take 1 tablet (8.6 mg total) by mouth 2 (two) times daily.     sitaGLIPtin 50 MG tablet  Commonly known as:  JANUVIA  Take 50 mg by mouth every morning.     UNKNOWN TO PATIENT  Take 0.5 tablets by mouth every evening. ANTIBIOTIC prescribed by Tarri Glenn (Dermatologist).       Allergies  Allergen Reactions  . Cephalexin     NOT SURE OF THE REACTION, BUT WAS TOLD NOT TO TAKE IT. Granddaughter mentioned that patient had hives, rash. Denies anaphylactic reaction.      The results of significant diagnostics from this hospitalization (including imaging, microbiology, ancillary and laboratory) are listed below for reference.    Significant Diagnostic Studies: Dg Chest 1 View  05/28/2014   CLINICAL DATA:  FALL HIP PAIN Pre op  EXAM: CHEST - 1 VIEW  COMPARISON:  Prior radiograph from 08/04/2013  FINDINGS: Median sternotomy wires of underlying CABG markers and surgical clips again noted, unchanged. Mild cardiomegaly is present. Atherosclerotic calcifications present within the aortic arch.  Lungs are normally inflated. No focal infiltrate, pulmonary edema, or pleural effusion. No pneumothorax.  No acute osseus abnormality. Degenerative osteoarthrosis noted at the right shoulder.  IMPRESSION: 1. No acute cardiopulmonary abnormality. 2. Stable cardiomegaly with sequelae of prior CABG.   Electronically Signed   By: Jeannine Boga M.D.   On: 05/28/2014 06:55   Dg Pelvis 1-2 Views  05/28/2014   CLINICAL DATA:  Left femur pain after a fall.  EXAM: PELVIS - 1-2 VIEW  COMPARISON:  03/26/2014  FINDINGS: Degenerative changes in the lower lumbar spine and in both hips. The pelvis appears intact without displaced  fracture identified. There is angular linear lucency in the intertrochanteric/ sub trochanteric region of the left hip with slight cortical irregularity suggested. This probably represents nondisplaced intertrochanteric fracture of the left hip. Dedicated left hip views would be useful for better evaluation. Multiple calcified phleboliths in the pelvis. Vascular calcifications.  IMPRESSION: Probable nondisplaced intertrochanteric fracture of the left hip.   Electronically Signed   By: Lucienne Capers M.D.   On: 05/28/2014 06:53   Dg Hip Operative Left  05/30/2014   CLINICAL DATA:  Left femoral fracture  EXAM: OPERATIVE LEFT HIP  COMPARISON:  None.  FINDINGS: Three spot films were obtained during placement of a fixation sideplate and compression screw. Multiple fixation screws are noted. The fracture fragments are in near anatomic alignment. 50 seconds of fluoroscopy was utilized.   Electronically Signed   By: Inez Catalina M.D.   On: 05/30/2014 11:26   Dg Femur Left  05/28/2014   CLINICAL DATA:  Left hip pain after a fall.  EXAM: LEFT FEMUR - 2 VIEW  COMPARISON:  11/19/2009  FINDINGS: Angular lucencies in the inter trochanteric/sub trochanteric region of the left proximal femur probably representing nondisplaced fracture. Femoral shaft and metaphysis seal region appear intact. Degenerative changes are present in the hip and knee. Vascular calcifications.  IMPRESSION: Probable acute intertrochanteric fracture left hip without displacement.   Electronically Signed   By: Lucienne Capers M.D.   On: 05/28/2014 06:55   Ct Head Wo Contrast  05/28/2014   CLINICAL DATA:  Headache, fall.  EXAM: CT HEAD WITHOUT CONTRAST  TECHNIQUE: Contiguous axial images were obtained from the base of the skull through the vertex without intravenous contrast.  COMPARISON:  Prior MRI from 08/04/2013 and CT 08/03/2014  FINDINGS: Age-related atrophy with chronic microvascular ischemic changes are seen, not significantly changed relative  to prior studies.  No acute intracranial hemorrhage or large vessel territory infarct. No extra-axial fluid collection. No hydrocephalus or midline shift. 2.2 cm ovoid extra-axial lesion at the lateral right cerebellar hemisphere again seen, most compatible with a meningioma.  No calvarial fracture.  Scalp soft tissues within normal limits.  No acute abnormality seen about the orbits.  Visualized paranasal sinuses and mastoid air  cells are clear.  IMPRESSION: 1. No acute intracranial process. 2. Advanced age-related atrophy with chronic microvascular ischemic disease. 3. 2.2 cm extra-axial well-circumscribed ovoid mass lateral to the left cerebellar hemisphere, most compatible with a meningioma.   Electronically Signed   By: Jeannine Boga M.D.   On: 05/28/2014 07:01    Microbiology: Recent Results (from the past 240 hour(s))  SURGICAL PCR SCREEN     Status: None   Collection Time    05/30/14  5:04 AM      Result Value Ref Range Status   MRSA, PCR NEGATIVE  NEGATIVE Final   Staphylococcus aureus NEGATIVE  NEGATIVE Final   Comment:            The Xpert SA Assay (FDA     approved for NASAL specimens     in patients over 75 years of age),     is one component of     a comprehensive surveillance     program.  Test performance has     been validated by [employer] for patients greater     than or equal to 68 year old.     It is not intended     to diagnose infection nor to     guide or monitor treatment.     Labs: Basic Metabolic Panel:  Recent Labs Lab 05/28/14 0602 05/29/14 0753 05/31/14 0548 06/01/14 0718  NA 140 135* 132* 134*  K 3.8 3.7 4.0 4.1  CL 103 99 95* 96  CO2 26 26 26 28   GLUCOSE 191* 195* 251* 192*  BUN 24* 17 13 19   CREATININE 0.78 0.71 0.65 0.77  CALCIUM 9.1 8.5 8.5 8.4   Liver Function Tests:  Recent Labs Lab 05/28/14 0650  ALBUMIN 3.7   No results found for this basename: LIPASE, AMYLASE,  in the last 168 hours No results found for this  basename: AMMONIA,  in the last 168 hours CBC:  Recent Labs Lab 05/28/14 0602 05/29/14 0753 05/30/14 0543 05/31/14 0548 06/01/14 0718  WBC 11.7* 7.9 6.9 11.2* 9.0  NEUTROABS 9.9* 6.2  --  8.0* 6.4  HGB 12.3 11.6* 11.6* 10.5* 9.5*  HCT 35.3* 32.9* 32.5* 29.5* 26.4*  MCV 90.7 90.1 90.3 89.7 90.1  PLT 164 140* 145* 160 152   Cardiac Enzymes: No results found for this basename: CKTOTAL, CKMB, CKMBINDEX, TROPONINI,  in the last 168 hours BNP: BNP (last 3 results) No results found for this basename: PROBNP,  in the last 8760 hours CBG:  Recent Labs Lab 05/30/14 2022 05/31/14 0751 05/31/14 1129 05/31/14 1618 05/31/14 2042  GLUCAP 275* 247* 305* 166* 169*       Signed:  Nita Sells  [facility] 06/01/2014, 8:50 AM

## 2014-06-01 NOTE — Clinical Social Work Placement (Signed)
Clinical Social Work Department CLINICAL SOCIAL WORK PLACEMENT NOTE 06/01/2014  Patient:  GERENE, NEDD  Account Number:  [id number] Admit date:  05/28/2014  Clinical Social Worker:  Benay Pike, LCSW  Date/time:     Clinical Social Work is seeking post-discharge placement for this patient at the following level of care:   SKILLED NURSING   (*CSW will update this form in Epic as items are completed)   05/29/2014  Patient/family provided with [place] Department of Clinical Social Work's list of facilities offering this level of care within the geographic area requested by the patient (or if unable, by the patient's family).  05/29/2014  Patient/family informed of their freedom to choose among providers that offer the needed level of care, that participate in Medicare, Medicaid or managed care program needed by the patient, have an available bed and are willing to accept the patient.  05/29/2014  Patient/family informed of [facility]' ownership interest in [facility], as well as of the fact that they are under no obligation to receive care at this facility.  PASARR submitted to EDS on  PASARR number received on   FL2 transmitted to all facilities in geographic area requested by pt/family on  06/01/2014 FL2 transmitted to all facilities within larger geographic area on   Patient informed that his/her managed care company has contracts with or will negotiate with  certain facilities, including the following:     Patient/family informed of bed offers received:  06/01/2014 Patient chooses bed at [facility] Physician recommends and patient chooses bed at  [facility]  Patient to be transferred to [facility] on  06/01/2014 Patient to be transferred to facility by RN Patient and family notified of transfer on 06/01/2014 Name of family member notified:  Leafy Ro- granddaughter  The following physician request were entered in Epic:   Additional  Comments: Pt has existing Sports administrator.  Benay Pike, [place]

## 2014-06-01 NOTE — Progress Notes (Signed)
IV removed. Report called to Tressie Ellis at the [facility]. Will transport patient after lunch.

## 2014-06-01 NOTE — Progress Notes (Signed)
Subjective: 2 Days Post-Op Procedure(s) (LRB): COMPRESSION HIP SCREW LEFT HIP (Left) Patient reports pain as mild.    Objective: Vital signs in last 24 hours: Temp:  [98.5 F (36.9 C)-99.8 F (37.7 C)] 99.7 F (37.6 C) (07/13 0350) Pulse Rate:  [75-92] 75 (07/13 0350) Resp:  [16-18] 16 (07/13 0400) BP: (90-154)/(40-58) 138/50 mmHg (07/13 0350) SpO2:  [92 %-97 %] 96 % (07/13 0400)  Intake/Output from previous day: 07/12 0701 - 07/13 0700 In: 240 [P.O.:240] Out: 835 [Urine:825; Blood:10] Intake/Output this shift:     Recent Labs  05/29/14 0753 05/30/14 0543 05/31/14 0548  HGB 11.6* 11.6* 10.5*    Recent Labs  05/30/14 0543 05/31/14 0548  WBC 6.9 11.2*  RBC 3.60* 3.29*  HCT 32.5* 29.5*  PLT 145* 160    Recent Labs  05/29/14 0753 05/31/14 0548  NA 135* 132*  K 3.7 4.0  CL 99 95*  CO2 26 26  BUN 17 13  CREATININE 0.71 0.65  GLUCOSE 195* 251*  CALCIUM 8.5 8.5    Recent Labs  05/29/14 0753 05/30/14 0543  INR 1.19 1.08    Neurovascular intact Sensation intact distally Intact pulses distally Dorsiflexion/Plantar flexion intact Incision: scant drainage  Hemovac removed.  Foley to be removed.  She will need SNF placement. Continue PT.  Assessment/Plan: 2 Days Post-Op Procedure(s) (LRB): COMPRESSION HIP SCREW LEFT HIP (Left) Up with therapy  Alicia Mason 06/01/2014, 7:08 AM

## 2014-06-01 NOTE — Clinical Social Work Note (Signed)
Pt d/c today to SNF. CSW met with pt and discussed further as well as with pt's granddaughter, Leafy Ro. Pt agreeable to SNF and accepted bed offer at [facility]. Facility notified. Mandy plans to follow up with [facility]. Pt will have private sitter from 9-5 daily there. D/C summary faxed. Pt to transfer with RN.   Benay Pike, [place]

## 2014-06-02 LAB — GLUCOSE, CAPILLARY
GLUCOSE-CAPILLARY: 211 mg/dL — AB (ref 70–99)
GLUCOSE-CAPILLARY: 261 mg/dL — AB (ref 70–99)
Glucose-Capillary: 162 mg/dL — ABNORMAL HIGH (ref 70–99)
Glucose-Capillary: 126 mg/dL — ABNORMAL HIGH (ref 70–99)
Glucose-Capillary: 138 mg/dL — ABNORMAL HIGH (ref 70–99)

## 2014-06-03 ENCOUNTER — Non-Acute Institutional Stay (SKILLED_NURSING_FACILITY): Payer: Medicare Other | Admitting: Internal Medicine

## 2014-06-03 DIAGNOSIS — I4891 Unspecified atrial fibrillation: Secondary | ICD-10-CM

## 2014-06-03 DIAGNOSIS — S72009S Fracture of unspecified part of neck of unspecified femur, sequela: Secondary | ICD-10-CM

## 2014-06-03 DIAGNOSIS — E1149 Type 2 diabetes mellitus with other diabetic neurological complication: Secondary | ICD-10-CM

## 2014-06-03 DIAGNOSIS — I10 Essential (primary) hypertension: Secondary | ICD-10-CM

## 2014-06-03 DIAGNOSIS — S72142S Displaced intertrochanteric fracture of left femur, sequela: Secondary | ICD-10-CM

## 2014-06-03 DIAGNOSIS — I480 Paroxysmal atrial fibrillation: Secondary | ICD-10-CM

## 2014-06-03 LAB — GLUCOSE, CAPILLARY
GLUCOSE-CAPILLARY: 205 mg/dL — AB (ref 70–99)
Glucose-Capillary: 212 mg/dL — ABNORMAL HIGH (ref 70–99)
Glucose-Capillary: 240 mg/dL — ABNORMAL HIGH (ref 70–99)
Glucose-Capillary: 182 mg/dL — ABNORMAL HIGH (ref 70–99)

## 2014-06-03 NOTE — Progress Notes (Signed)
Patient ID: Alicia Mason, female   DOB: [birth year], 80 y.o.   MRN: [id number]  Facility; [facility] Chief complaint; admission to SNF post admit to [facility] from 7/9 to 7/13  History; this is a 80 year old woman who lives in her own home. She has some form of in home service she says comes in daily to help her with housework etc. although she claims to be independent with ADLs. She was admitted after having fallen in her own home. She suffered a left hip fracture and underwent a left hip pinning with a compression screw on 711. She is here for rehabilitation.  Her postoperative course was complicated by delirium felt to be secondary to narcotics, Phenergan. Her xarelto at 20 mg was restarted on 7/12. The patient has a history of atrial fibrillation with a right middle cerebral artery stroke in 2014 from which she made a good recovery.  The patient is a type II diabetic on insulin. She receives Lantus insulin 10 units at bedtime and she is on short acting insulin 5 units 3 times a day with meals. I do not see a hemoglobin A1c from the hospital although there was one of 5.7 in September 2014. She was also discovered to have a TSH which was suppressed however her free T4 was within the normal range. I do not see a free T3  Past Medical History  Diagnosis Date  . Essential hypertension, benign   . Type 2 diabetes mellitus   . Coronary atherosclerosis of native coronary artery     Multivessel  . Closed fracture of right humerus     Managed conservatively 2011  . Cerebrovascular disease   . Seasonal allergies   . Arthritis   . Vertigo   . Ischemic cardiomyopathy     45-50% 07/2013/ EF 40% per Echo 06/2011.  Marland Kitchen Near syncope   . NSTEMI (non-ST elevated myocardial infarction)     06/2011.  . Diabetes mellitus   . Acute ischemic stroke 08/04/2013    Right MCA stroke  . Atrial fibrillation 05/28/2009    Paroxysmal    . Dyslipidemia 08/07/2013   Past Surgical History  Procedure Laterality Date  .  Coronary artery bypass graft  1999    LIMA to LAD, SVG to OM1 and OM2, SVG to PDA  . Cataract surgery    . Abdominal hysterectomy      WITH BSO.  . Orif hip fracture Left 05/30/2014    Procedure: COMPRESSION HIP SCREW LEFT HIP;  Surgeon: Sanjuana Kava, MD;  Location: [facility];  Service: Orthopedics;  Laterality: Left;   Current Outpatient Prescriptions on File Prior to Visit  Medication Sig Dispense Refill  . acetaminophen-codeine (TYLENOL #3) 300-30 MG per tablet Take 1 tablet by mouth every 6 (six) hours as needed for moderate pain.  30 tablet  0  . ALPRAZolam (XANAX) 0.5 MG tablet Take one tablet by mouth every 8 hours as needed for anxiety  90 tablet  0  . bisacodyl (DULCOLAX) 10 MG suppository Place 1 suppository (10 mg total) rectally daily as needed for moderate constipation.  12 suppository  0  . insulin aspart (NOVOLOG) 100 UNIT/ML injection Inject 5 Units into the skin 3 (three) times daily with meals.  10 mL  11  . insulin glargine (LANTUS) 100 UNIT/ML injection Inject 10 Units into the skin at bedtime.      . metoprolol (LOPRESSOR) 50 MG tablet Take 1 tablet (50 mg total) by mouth 2 (two) times daily.  [place]  tablet  0  . mirabegron ER (MYRBETRIQ) 25 MG TB24 tablet Take 1 tablet (25 mg total) by mouth daily.      . potassium chloride (K-DUR) 10 MEQ tablet Take 10 mEq by mouth every other day.      . pravastatin (PRAVACHOL) 10 MG tablet Take 10 mg by mouth every morning.       . Rivaroxaban (XARELTO) 15 MG TABS tablet Take 1 tablet (15 mg total) by mouth daily before breakfast.  30 tablet    . senna (SENOKOT) 8.6 MG TABS tablet Take 1 tablet (8.6 mg total) by mouth 2 (two) times daily.  120 each  0  . sitaGLIPtin (JANUVIA) 50 MG tablet Take 50 mg by mouth every morning.      Marland Kitchen UNKNOWN TO PATIENT Take 0.5 tablets by mouth every evening. ANTIBIOTIC prescribed by Tarri Glenn (Dermatologist).         Social history; patient apparently lives in her own home. As mentioned in the history of present  illness she has some form of in home service. She says she has 2 children who both live in [place]. At this point I am not certain of her functional level at home for the degree of support. She says she has cane, however I'm not sure of her compliance with this or how she was using.  reports that she has never smoked. She has never used smokeless tobacco. She reports that she does not drink alcohol or use illicit drugs.  family history includes Arthritis in her other; CAD in her sister; Cancer in her other; Coronary artery disease in her other; Diabetes in her other; Pneumonia in her mother.  Review of systems Respiratory she is not complaining of shortness of breath Cardiac she has a remote CABG scar but does not describe exertional shortness of breath Abdomen no nausea vomiting or abdominal pain GU no dysuria  Physical examination Gen. patient looks remarkably well. Calm and cooperative Vitals pulse rate 74 and regular respirations 18 and unlabored O2 sat is 95% on room air Respiratory clear entry bilaterally no wheezing work of breathing is normal Cardiac; as aforementioned CABG scar. Heart sounds are normal there are no gallops no evidence of heart failure no murmurs Abdomen; soft no liver no spleen no masses GU bladder is not enlarged there is no costovertebral angle tenderness Musculoskeletal; left hip incision is clean and well opposed no evidence of infection. She has considerable bruising around her left upper humerus and shoulder however range of motion here is normal. There is no joint effusion.. Neurologic; she is antigravity strength in her arms and her right leg. There is wasting of her quads on the right with absent knee jerks and ankle jerks. Her plantar response is flexor Mental status; she is orientated to the year. Could call me her date of birth and address. However she stated she was 89 and she was able to tell me about her in-home care. Gait I did not attempt to ambulate  her or stand her Skin; there is an area on the tip of her left great toe that is erythematous and does not blanch. Apparently has a blanching area over her coccyx area as well. Skin tear in the left upper arm. This does not appear to be infected however it does need to be cleaned properly with a nonadhesive dressing and a Curlex rather than the foam which is tearing her frail skin  Impression/plan #1 status post left hip fracture and ORIF. She'll need intensive period  of occupational and physical therapy. #2 history of atrial fibrillation with an embolic stroke for which the patient was admitted to this facility in the fall of 2014. She is back on xarelto. She had postoperative anemia with a hemoglobin of 9.5 this will need to be followed early next week #3 skin tear in the left upper arm. I will change the orders here to something that is nonadherent #4 protein calorie malnutrition listed as severe although her albumin level on 7/9 was 3.7. I am not sure on what basis this was determined that. Whether there is a history of weight loss for example #5 history of coronary artery disease this does not appear to be active. She does have a history of a non-ST elevation MI. A followup with cardiology would suggested. Would be concerned about this patient on xarelto as well as antiplatelet therapy. Especially with a history of falls in the underlying frailty #6 isolated suppression of the TSH with a normal free T4. She should have a free T3 level checked as well #7 type 2 diabetes on insulin. The patient was apparently giving her own insulin. Do not see a recent hemoglobin A1c. Believe when she was last here I reduced her insulin in response to a low hemoglobin A1c at that time. I think would be reasonable to repeat this #8 dermatitis I am not exactly sure what this means although the patient was on some form of treatment #9 hyperlipidemia I am not going to work this up further while she was here

## 2014-06-03 NOTE — Progress Notes (Signed)
UR chart review completed.  

## 2014-06-04 LAB — GLUCOSE, CAPILLARY
Glucose-Capillary: 134 mg/dL — ABNORMAL HIGH (ref 70–99)
Glucose-Capillary: 118 mg/dL — ABNORMAL HIGH (ref 70–99)
Glucose-Capillary: 150 mg/dL — ABNORMAL HIGH (ref 70–99)

## 2014-06-05 LAB — GLUCOSE, CAPILLARY
GLUCOSE-CAPILLARY: 185 mg/dL — AB (ref 70–99)
GLUCOSE-CAPILLARY: 112 mg/dL — AB (ref 70–99)
GLUCOSE-CAPILLARY: 196 mg/dL — AB (ref 70–99)

## 2014-06-07 LAB — GLUCOSE, CAPILLARY
Glucose-Capillary: 115 mg/dL — ABNORMAL HIGH (ref 70–99)
Glucose-Capillary: 134 mg/dL — ABNORMAL HIGH (ref 70–99)
Glucose-Capillary: 102 mg/dL — ABNORMAL HIGH (ref 70–99)

## 2014-06-08 ENCOUNTER — Non-Acute Institutional Stay (SKILLED_NURSING_FACILITY): Payer: Medicare Other | Admitting: Internal Medicine

## 2014-06-08 DIAGNOSIS — M25552 Pain in left hip: Secondary | ICD-10-CM

## 2014-06-08 DIAGNOSIS — M25559 Pain in unspecified hip: Secondary | ICD-10-CM

## 2014-06-08 LAB — GLUCOSE, CAPILLARY
GLUCOSE-CAPILLARY: 169 mg/dL — AB (ref 70–99)
GLUCOSE-CAPILLARY: 198 mg/dL — AB (ref 70–99)
Glucose-Capillary: 105 mg/dL — ABNORMAL HIGH (ref 70–99)
Glucose-Capillary: 158 mg/dL — ABNORMAL HIGH (ref 70–99)
Glucose-Capillary: 170 mg/dL — ABNORMAL HIGH (ref 70–99)

## 2014-06-09 LAB — GLUCOSE, CAPILLARY
GLUCOSE-CAPILLARY: 167 mg/dL — AB (ref 70–99)
Glucose-Capillary: 172 mg/dL — ABNORMAL HIGH (ref 70–99)
Glucose-Capillary: 160 mg/dL — ABNORMAL HIGH (ref 70–99)
Glucose-Capillary: 158 mg/dL — ABNORMAL HIGH (ref 70–99)

## 2014-06-10 LAB — GLUCOSE, CAPILLARY: Glucose-Capillary: 251 mg/dL — ABNORMAL HIGH (ref 70–99)

## 2014-06-10 NOTE — Progress Notes (Addendum)
Patient ID: Alicia Mason, female   DOB: [birth year], 80 y.o.   MRN: [id number]               PROGRESS NOTE  DATE:  06/08/2014    FACILITY: [facility]    LEVEL OF CARE:   SNF   Acute Visit   CHIEF COMPLAINT:  Left hip pain.    HISTORY OF PRESENT ILLNESS:  This is a 80 year-old woman who came to [place] after sustaining a left hip fracture.  She is  apparently very sensitive to opioids and there is a comment from her hospitalist that he recommended not using anything stronger than Tylenol #3.  She  apparently gets confused on opioids including tramadol, although Tylenol #3 is an opioid.  There is also a caution with anticholinergics (including skeletal muscle relaxants) and antihistamines such as Phenergan.  Nevertheless, she is  apparently very uncomfortable down in physical therapy.     She is on Xarelto for DVT prophylaxis as well as a history of atrial fibrillation.  Lab work from today shows a hemoglobin of 9.4.   Her basic metabolic panel is normal.     PHYSICAL EXAMINATION:   GENERAL APPEARANCE:  The patient is not in any distress.   CARDIOVASCULAR:  CARDIAC:   Heart sounds are normal.  There is a previous CABG scar here.  She appears to be euvolemic.   EDEMA/VARICOSITIES:  Left leg:  There is no evidence of a DVT.    ASSESSMENT/PLAN:  Left hip pain.  At this point, I think the best solution to this dilemma is to give her some of her Tylenol #3 half an hour prior to her physical therapy.  Hopefully, this will not precipitate delirium but will make her a little more functional.    Other medical issues, I think, are fairly stable.

## 2014-06-11 LAB — GLUCOSE, CAPILLARY
GLUCOSE-CAPILLARY: 174 mg/dL — AB (ref 70–99)
Glucose-Capillary: 173 mg/dL — ABNORMAL HIGH (ref 70–99)

## 2014-06-12 LAB — GLUCOSE, CAPILLARY
GLUCOSE-CAPILLARY: 150 mg/dL — AB (ref 70–99)
GLUCOSE-CAPILLARY: 115 mg/dL — AB (ref 70–99)
Glucose-Capillary: 188 mg/dL — ABNORMAL HIGH (ref 70–99)

## 2014-06-13 LAB — GLUCOSE, CAPILLARY
GLUCOSE-CAPILLARY: 80 mg/dL (ref 70–99)
Glucose-Capillary: 128 mg/dL — ABNORMAL HIGH (ref 70–99)
Glucose-Capillary: 144 mg/dL — ABNORMAL HIGH (ref 70–99)

## 2014-06-14 LAB — GLUCOSE, CAPILLARY
GLUCOSE-CAPILLARY: 140 mg/dL — AB (ref 70–99)
Glucose-Capillary: 116 mg/dL — ABNORMAL HIGH (ref 70–99)
Glucose-Capillary: 196 mg/dL — ABNORMAL HIGH (ref 70–99)
Glucose-Capillary: 134 mg/dL — ABNORMAL HIGH (ref 70–99)
Glucose-Capillary: 115 mg/dL — ABNORMAL HIGH (ref 70–99)

## 2014-06-15 LAB — GLUCOSE, CAPILLARY
GLUCOSE-CAPILLARY: 202 mg/dL — AB (ref 70–99)
Glucose-Capillary: 117 mg/dL — ABNORMAL HIGH (ref 70–99)
Glucose-Capillary: 203 mg/dL — ABNORMAL HIGH (ref 70–99)
Glucose-Capillary: 185 mg/dL — ABNORMAL HIGH (ref 70–99)

## 2014-06-17 LAB — GLUCOSE, CAPILLARY: GLUCOSE-CAPILLARY: 111 mg/dL — AB (ref 70–99)

## 2014-06-18 LAB — GLUCOSE, CAPILLARY
Glucose-Capillary: 130 mg/dL — ABNORMAL HIGH (ref 70–99)
Glucose-Capillary: 194 mg/dL — ABNORMAL HIGH (ref 70–99)

## 2014-06-19 LAB — GLUCOSE, CAPILLARY
Glucose-Capillary: 145 mg/dL — ABNORMAL HIGH (ref 70–99)
Glucose-Capillary: 187 mg/dL — ABNORMAL HIGH (ref 70–99)
Glucose-Capillary: 98 mg/dL (ref 70–99)
Glucose-Capillary: 164 mg/dL — ABNORMAL HIGH (ref 70–99)

## 2014-06-20 LAB — GLUCOSE, CAPILLARY
Glucose-Capillary: 91 mg/dL (ref 70–99)
Glucose-Capillary: 188 mg/dL — ABNORMAL HIGH (ref 70–99)

## 2014-06-21 LAB — GLUCOSE, CAPILLARY
GLUCOSE-CAPILLARY: 91 mg/dL (ref 70–99)
Glucose-Capillary: 212 mg/dL — ABNORMAL HIGH (ref 70–99)

## 2014-06-22 LAB — GLUCOSE, CAPILLARY
GLUCOSE-CAPILLARY: 130 mg/dL — AB (ref 70–99)
Glucose-Capillary: 181 mg/dL — ABNORMAL HIGH (ref 70–99)

## 2014-06-23 ENCOUNTER — Ambulatory Visit (HOSPITAL_COMMUNITY): Payer: Medicare Other | Attending: Internal Medicine

## 2014-06-23 DIAGNOSIS — M25559 Pain in unspecified hip: Secondary | ICD-10-CM | POA: Diagnosis not present

## 2014-06-23 DIAGNOSIS — M79609 Pain in unspecified limb: Secondary | ICD-10-CM | POA: Diagnosis not present

## 2014-06-23 LAB — GLUCOSE, CAPILLARY
GLUCOSE-CAPILLARY: 133 mg/dL — AB (ref 70–99)
GLUCOSE-CAPILLARY: 161 mg/dL — AB (ref 70–99)
GLUCOSE-CAPILLARY: 214 mg/dL — AB (ref 70–99)
Glucose-Capillary: 130 mg/dL — ABNORMAL HIGH (ref 70–99)

## 2014-06-24 LAB — GLUCOSE, CAPILLARY
GLUCOSE-CAPILLARY: 103 mg/dL — AB (ref 70–99)
Glucose-Capillary: 89 mg/dL (ref 70–99)
Glucose-Capillary: 135 mg/dL — ABNORMAL HIGH (ref 70–99)

## 2014-06-25 LAB — GLUCOSE, CAPILLARY
Glucose-Capillary: 133 mg/dL — ABNORMAL HIGH (ref 70–99)
Glucose-Capillary: 224 mg/dL — ABNORMAL HIGH (ref 70–99)

## 2014-06-26 LAB — GLUCOSE, CAPILLARY: GLUCOSE-CAPILLARY: 168 mg/dL — AB (ref 70–99)

## 2014-06-27 LAB — GLUCOSE, CAPILLARY
GLUCOSE-CAPILLARY: 94 mg/dL (ref 70–99)
GLUCOSE-CAPILLARY: 89 mg/dL (ref 70–99)
GLUCOSE-CAPILLARY: 144 mg/dL — AB (ref 70–99)
Glucose-Capillary: 101 mg/dL — ABNORMAL HIGH (ref 70–99)

## 2014-06-28 LAB — GLUCOSE, CAPILLARY
Glucose-Capillary: 101 mg/dL — ABNORMAL HIGH (ref 70–99)
Glucose-Capillary: 203 mg/dL — ABNORMAL HIGH (ref 70–99)
Glucose-Capillary: 140 mg/dL — ABNORMAL HIGH (ref 70–99)
Glucose-Capillary: 93 mg/dL (ref 70–99)

## 2014-06-29 LAB — GLUCOSE, CAPILLARY
GLUCOSE-CAPILLARY: 117 mg/dL — AB (ref 70–99)
GLUCOSE-CAPILLARY: 135 mg/dL — AB (ref 70–99)
Glucose-Capillary: 72 mg/dL (ref 70–99)
Glucose-Capillary: 158 mg/dL — ABNORMAL HIGH (ref 70–99)

## 2014-06-30 ENCOUNTER — Ambulatory Visit (HOSPITAL_COMMUNITY): Payer: Medicare Other | Attending: Orthopaedic Surgery

## 2014-06-30 DIAGNOSIS — Z4789 Encounter for other orthopedic aftercare: Secondary | ICD-10-CM | POA: Insufficient documentation

## 2014-06-30 DIAGNOSIS — IMO0002 Reserved for concepts with insufficient information to code with codable children: Secondary | ICD-10-CM | POA: Diagnosis present

## 2014-06-30 LAB — GLUCOSE, CAPILLARY
Glucose-Capillary: 89 mg/dL (ref 70–99)
Glucose-Capillary: 86 mg/dL (ref 70–99)
Glucose-Capillary: 234 mg/dL — ABNORMAL HIGH (ref 70–99)

## 2014-07-01 LAB — GLUCOSE, CAPILLARY
GLUCOSE-CAPILLARY: 107 mg/dL — AB (ref 70–99)
GLUCOSE-CAPILLARY: 139 mg/dL — AB (ref 70–99)
Glucose-Capillary: 226 mg/dL — ABNORMAL HIGH (ref 70–99)
Glucose-Capillary: 66 mg/dL — ABNORMAL LOW (ref 70–99)
Glucose-Capillary: 228 mg/dL — ABNORMAL HIGH (ref 70–99)

## 2014-07-02 ENCOUNTER — Other Ambulatory Visit: Payer: Self-pay | Admitting: *Deleted

## 2014-07-02 LAB — GLUCOSE, CAPILLARY
Glucose-Capillary: 169 mg/dL — ABNORMAL HIGH (ref 70–99)
Glucose-Capillary: 272 mg/dL — ABNORMAL HIGH (ref 70–99)

## 2014-07-02 MED ORDER — ACETAMINOPHEN-CODEINE #3 300-30 MG PO TABS: ORAL_TABLET | ORAL | Status: DC

## 2014-07-02 NOTE — Telephone Encounter (Signed)
[facility] 

## 2014-07-03 LAB — GLUCOSE, CAPILLARY
Glucose-Capillary: 177 mg/dL — ABNORMAL HIGH (ref 70–99)
Glucose-Capillary: 210 mg/dL — ABNORMAL HIGH (ref 70–99)
Glucose-Capillary: 194 mg/dL — ABNORMAL HIGH (ref 70–99)

## 2014-07-04 LAB — GLUCOSE, CAPILLARY
GLUCOSE-CAPILLARY: 185 mg/dL — AB (ref 70–99)
Glucose-Capillary: 294 mg/dL — ABNORMAL HIGH (ref 70–99)

## 2014-07-05 LAB — GLUCOSE, CAPILLARY
GLUCOSE-CAPILLARY: 120 mg/dL — AB (ref 70–99)
GLUCOSE-CAPILLARY: 175 mg/dL — AB (ref 70–99)

## 2014-07-06 LAB — GLUCOSE, CAPILLARY
Glucose-Capillary: 78 mg/dL (ref 70–99)
Glucose-Capillary: 259 mg/dL — ABNORMAL HIGH (ref 70–99)

## 2014-07-07 LAB — GLUCOSE, CAPILLARY
GLUCOSE-CAPILLARY: 131 mg/dL — AB (ref 70–99)
GLUCOSE-CAPILLARY: 105 mg/dL — AB (ref 70–99)
Glucose-Capillary: 231 mg/dL — ABNORMAL HIGH (ref 70–99)
Glucose-Capillary: 190 mg/dL — ABNORMAL HIGH (ref 70–99)

## 2014-07-08 LAB — GLUCOSE, CAPILLARY
GLUCOSE-CAPILLARY: 237 mg/dL — AB (ref 70–99)
GLUCOSE-CAPILLARY: 91 mg/dL (ref 70–99)
Glucose-Capillary: 149 mg/dL — ABNORMAL HIGH (ref 70–99)
Glucose-Capillary: 200 mg/dL — ABNORMAL HIGH (ref 70–99)

## 2014-07-09 LAB — GLUCOSE, CAPILLARY
GLUCOSE-CAPILLARY: 105 mg/dL — AB (ref 70–99)
GLUCOSE-CAPILLARY: 60 mg/dL — AB (ref 70–99)
GLUCOSE-CAPILLARY: 83 mg/dL (ref 70–99)
Glucose-Capillary: 163 mg/dL — ABNORMAL HIGH (ref 70–99)
Glucose-Capillary: 306 mg/dL — ABNORMAL HIGH (ref 70–99)

## 2014-07-10 LAB — GLUCOSE, CAPILLARY
GLUCOSE-CAPILLARY: 169 mg/dL — AB (ref 70–99)
Glucose-Capillary: 122 mg/dL — ABNORMAL HIGH (ref 70–99)
Glucose-Capillary: 119 mg/dL — ABNORMAL HIGH (ref 70–99)
Glucose-Capillary: 214 mg/dL — ABNORMAL HIGH (ref 70–99)

## 2014-07-11 LAB — GLUCOSE, CAPILLARY
Glucose-Capillary: 223 mg/dL — ABNORMAL HIGH (ref 70–99)
Glucose-Capillary: 188 mg/dL — ABNORMAL HIGH (ref 70–99)
Glucose-Capillary: 160 mg/dL — ABNORMAL HIGH (ref 70–99)

## 2014-07-12 LAB — GLUCOSE, CAPILLARY
GLUCOSE-CAPILLARY: 82 mg/dL (ref 70–99)
GLUCOSE-CAPILLARY: 232 mg/dL — AB (ref 70–99)
GLUCOSE-CAPILLARY: 198 mg/dL — AB (ref 70–99)
Glucose-Capillary: 164 mg/dL — ABNORMAL HIGH (ref 70–99)

## 2014-07-13 LAB — GLUCOSE, CAPILLARY
Glucose-Capillary: 137 mg/dL — ABNORMAL HIGH (ref 70–99)
Glucose-Capillary: 189 mg/dL — ABNORMAL HIGH (ref 70–99)
Glucose-Capillary: 163 mg/dL — ABNORMAL HIGH (ref 70–99)
Glucose-Capillary: 225 mg/dL — ABNORMAL HIGH (ref 70–99)

## 2014-07-14 LAB — GLUCOSE, CAPILLARY
GLUCOSE-CAPILLARY: 148 mg/dL — AB (ref 70–99)
GLUCOSE-CAPILLARY: 212 mg/dL — AB (ref 70–99)
GLUCOSE-CAPILLARY: 175 mg/dL — AB (ref 70–99)

## 2014-07-15 LAB — GLUCOSE, CAPILLARY
GLUCOSE-CAPILLARY: 134 mg/dL — AB (ref 70–99)
GLUCOSE-CAPILLARY: 165 mg/dL — AB (ref 70–99)
Glucose-Capillary: 194 mg/dL — ABNORMAL HIGH (ref 70–99)
Glucose-Capillary: 151 mg/dL — ABNORMAL HIGH (ref 70–99)

## 2014-07-16 LAB — GLUCOSE, CAPILLARY
GLUCOSE-CAPILLARY: 130 mg/dL — AB (ref 70–99)
Glucose-Capillary: 144 mg/dL — ABNORMAL HIGH (ref 70–99)
Glucose-Capillary: 231 mg/dL — ABNORMAL HIGH (ref 70–99)

## 2014-07-17 LAB — GLUCOSE, CAPILLARY
GLUCOSE-CAPILLARY: 129 mg/dL — AB (ref 70–99)
GLUCOSE-CAPILLARY: 141 mg/dL — AB (ref 70–99)
Glucose-Capillary: 186 mg/dL — ABNORMAL HIGH (ref 70–99)
Glucose-Capillary: 248 mg/dL — ABNORMAL HIGH (ref 70–99)

## 2014-07-18 LAB — GLUCOSE, CAPILLARY
Glucose-Capillary: 120 mg/dL — ABNORMAL HIGH (ref 70–99)
Glucose-Capillary: 163 mg/dL — ABNORMAL HIGH (ref 70–99)
Glucose-Capillary: 186 mg/dL — ABNORMAL HIGH (ref 70–99)

## 2014-07-19 LAB — GLUCOSE, CAPILLARY
GLUCOSE-CAPILLARY: 157 mg/dL — AB (ref 70–99)
GLUCOSE-CAPILLARY: 201 mg/dL — AB (ref 70–99)
Glucose-Capillary: 121 mg/dL — ABNORMAL HIGH (ref 70–99)

## 2014-07-20 LAB — GLUCOSE, CAPILLARY
GLUCOSE-CAPILLARY: 89 mg/dL (ref 70–99)
Glucose-Capillary: 120 mg/dL — ABNORMAL HIGH (ref 70–99)
Glucose-Capillary: 190 mg/dL — ABNORMAL HIGH (ref 70–99)
Glucose-Capillary: 144 mg/dL — ABNORMAL HIGH (ref 70–99)

## 2014-07-21 ENCOUNTER — Other Ambulatory Visit: Payer: Self-pay | Admitting: *Deleted

## 2014-07-21 LAB — GLUCOSE, CAPILLARY
Glucose-Capillary: 103 mg/dL — ABNORMAL HIGH (ref 70–99)
Glucose-Capillary: 202 mg/dL — ABNORMAL HIGH (ref 70–99)
Glucose-Capillary: 211 mg/dL — ABNORMAL HIGH (ref 70–99)

## 2014-07-21 MED ORDER — ALPRAZOLAM 0.25 MG PO TABS: ORAL_TABLET | ORAL | Status: DC

## 2014-07-21 NOTE — Telephone Encounter (Signed)
[facility] 

## 2014-07-22 LAB — GLUCOSE, CAPILLARY
GLUCOSE-CAPILLARY: 129 mg/dL — AB (ref 70–99)
GLUCOSE-CAPILLARY: 159 mg/dL — AB (ref 70–99)
Glucose-Capillary: 252 mg/dL — ABNORMAL HIGH (ref 70–99)
Glucose-Capillary: 119 mg/dL — ABNORMAL HIGH (ref 70–99)
Glucose-Capillary: 206 mg/dL — ABNORMAL HIGH (ref 70–99)

## 2014-07-23 LAB — GLUCOSE, CAPILLARY
GLUCOSE-CAPILLARY: 236 mg/dL — AB (ref 70–99)
Glucose-Capillary: 86 mg/dL (ref 70–99)
Glucose-Capillary: 274 mg/dL — ABNORMAL HIGH (ref 70–99)

## 2014-07-24 LAB — GLUCOSE, CAPILLARY: GLUCOSE-CAPILLARY: 288 mg/dL — AB (ref 70–99)

## 2014-07-25 LAB — GLUCOSE, CAPILLARY
Glucose-Capillary: 117 mg/dL — ABNORMAL HIGH (ref 70–99)
Glucose-Capillary: 231 mg/dL — ABNORMAL HIGH (ref 70–99)
Glucose-Capillary: 280 mg/dL — ABNORMAL HIGH (ref 70–99)

## 2014-07-26 LAB — GLUCOSE, CAPILLARY
GLUCOSE-CAPILLARY: 128 mg/dL — AB (ref 70–99)
GLUCOSE-CAPILLARY: 207 mg/dL — AB (ref 70–99)
GLUCOSE-CAPILLARY: 174 mg/dL — AB (ref 70–99)
Glucose-Capillary: 78 mg/dL (ref 70–99)

## 2014-07-27 LAB — GLUCOSE, CAPILLARY
GLUCOSE-CAPILLARY: 75 mg/dL (ref 70–99)
GLUCOSE-CAPILLARY: 185 mg/dL — AB (ref 70–99)
Glucose-Capillary: 228 mg/dL — ABNORMAL HIGH (ref 70–99)

## 2014-07-28 LAB — GLUCOSE, CAPILLARY
Glucose-Capillary: 183 mg/dL — ABNORMAL HIGH (ref 70–99)
Glucose-Capillary: 146 mg/dL — ABNORMAL HIGH (ref 70–99)

## 2014-07-29 LAB — GLUCOSE, CAPILLARY
GLUCOSE-CAPILLARY: 111 mg/dL — AB (ref 70–99)
GLUCOSE-CAPILLARY: 229 mg/dL — AB (ref 70–99)
Glucose-Capillary: 90 mg/dL (ref 70–99)
Glucose-Capillary: 206 mg/dL — ABNORMAL HIGH (ref 70–99)

## 2014-07-30 LAB — GLUCOSE, CAPILLARY
GLUCOSE-CAPILLARY: 214 mg/dL — AB (ref 70–99)
Glucose-Capillary: 139 mg/dL — ABNORMAL HIGH (ref 70–99)
Glucose-Capillary: 205 mg/dL — ABNORMAL HIGH (ref 70–99)

## 2014-07-31 LAB — GLUCOSE, CAPILLARY
GLUCOSE-CAPILLARY: 108 mg/dL — AB (ref 70–99)
GLUCOSE-CAPILLARY: 209 mg/dL — AB (ref 70–99)
Glucose-Capillary: 229 mg/dL — ABNORMAL HIGH (ref 70–99)
Glucose-Capillary: 162 mg/dL — ABNORMAL HIGH (ref 70–99)

## 2014-08-01 LAB — GLUCOSE, CAPILLARY
Glucose-Capillary: 99 mg/dL (ref 70–99)
Glucose-Capillary: 272 mg/dL — ABNORMAL HIGH (ref 70–99)
Glucose-Capillary: 235 mg/dL — ABNORMAL HIGH (ref 70–99)

## 2014-08-02 LAB — GLUCOSE, CAPILLARY
GLUCOSE-CAPILLARY: 118 mg/dL — AB (ref 70–99)
Glucose-Capillary: 89 mg/dL (ref 70–99)
Glucose-Capillary: 273 mg/dL — ABNORMAL HIGH (ref 70–99)
Glucose-Capillary: 206 mg/dL — ABNORMAL HIGH (ref 70–99)

## 2014-08-03 LAB — GLUCOSE, CAPILLARY
GLUCOSE-CAPILLARY: 146 mg/dL — AB (ref 70–99)
Glucose-Capillary: 95 mg/dL (ref 70–99)
Glucose-Capillary: 263 mg/dL — ABNORMAL HIGH (ref 70–99)
Glucose-Capillary: 107 mg/dL — ABNORMAL HIGH (ref 70–99)

## 2014-08-04 ENCOUNTER — Non-Acute Institutional Stay (SKILLED_NURSING_FACILITY): Payer: Medicare Other | Admitting: Internal Medicine

## 2014-08-04 DIAGNOSIS — I10 Essential (primary) hypertension: Secondary | ICD-10-CM

## 2014-08-04 DIAGNOSIS — I4891 Unspecified atrial fibrillation: Secondary | ICD-10-CM

## 2014-08-04 DIAGNOSIS — I2589 Other forms of chronic ischemic heart disease: Secondary | ICD-10-CM

## 2014-08-04 DIAGNOSIS — I482 Chronic atrial fibrillation, unspecified: Secondary | ICD-10-CM

## 2014-08-04 DIAGNOSIS — I255 Ischemic cardiomyopathy: Secondary | ICD-10-CM

## 2014-08-04 DIAGNOSIS — S72009D Fracture of unspecified part of neck of unspecified femur, subsequent encounter for closed fracture with routine healing: Secondary | ICD-10-CM

## 2014-08-04 DIAGNOSIS — E119 Type 2 diabetes mellitus without complications: Secondary | ICD-10-CM

## 2014-08-04 LAB — GLUCOSE, CAPILLARY
GLUCOSE-CAPILLARY: 231 mg/dL — AB (ref 70–99)
Glucose-Capillary: 129 mg/dL — ABNORMAL HIGH (ref 70–99)
Glucose-Capillary: 190 mg/dL — ABNORMAL HIGH (ref 70–99)

## 2014-08-04 NOTE — Progress Notes (Signed)
Patient ID: Alicia Mason, female   DOB: 09-09-1922, 80 y.o.   MRN: [id number]   this is a discharge note.  Level of care skilled.  Facility [facility]  Chief complaint- discharge note   History of present illness---; this is a 80 year old woman who lives in her own home. She had some form of in home service she says comes in daily to help her with housework etc. although she claimed to be independent with ADLs. She was admitted after having fallen in her own home. She suffered a left hip fracture and underwent a left hip pinning with a compression screw on 711. She was here for rehabilitation.  Her postoperative course was complicated by delirium felt to be secondary to narcotics, Phenergan. Her xarelto at 20 mg was restarted on 7/12. The patient has a history of atrial fibrillation with a right middle cerebral artery stroke in 2014 from which she made a good recovery--she continues on Xarelto 15 mg a day- .  The patient is a type II diabetic on insulin. She receives Lantus insulin 10 units at bedtime and she is on short acting insulin 5 units 3 times a day with meals.  CBGs in the morning appeared around in the 80s-low 100s generally over the past week ranged 89-139.  At noon sugars somewhat more elevated in the lower 200s to mid 200s.  At 4 PM range of 111-206.  N. at night range 107-235   In the hospital was also discovered to have a TSH which was suppressed however her free T4 was within the normal  Her stay here. has been relatively unremarkable that she is receiving Tylenol with codeine for her pain-also is on Xanax when necessary for anxiety.  I do note per chart review she has been seen by psychiatric services and apparently has had some significant anxiety and paranoid thoughts-she was seen recently by psychiatric services and started on Namenda and apparently with psychiatric services was somewhat uncooperative with exam-however this evening she is pleasant and cooperative looking  forward to going home-.  She has followup with orthopedics scheduled on September 17.  In regards to her home situation she will have sitters with 24-hour a day care    Past Medical History   Diagnosis  Date   .  Essential hypertension, benign    .  Type 2 diabetes mellitus    .  Coronary atherosclerosis of native coronary artery      Multivessel   .  Closed fracture of right humerus      Managed conservatively 2011   .  Cerebrovascular disease    .  Seasonal allergies    .  Arthritis    .  Vertigo    .  Ischemic cardiomyopathy      45-50% 07/2013/ EF 40% per Echo 06/2011.   Marland Kitchen  Near syncope    .  NSTEMI (non-ST elevated myocardial infarction)      06/2011.   .  Diabetes mellitus    .  Acute ischemic stroke  08/04/2013     Right MCA stroke   .  Atrial fibrillation  05/28/2009     Paroxysmal   .  Dyslipidemia  08/07/2013    Past Surgical History   Procedure  Laterality  Date   .  Coronary artery bypass graft   1999     LIMA to LAD, SVG to OM1 and OM2, SVG to PDA   .  Cataract surgery     .  Abdominal  hysterectomy       WITH BSO.   .  Orif hip fracture  Left  05/30/2014     Procedure: COMPRESSION HIP SCREW LEFT HIP; Surgeon: Sanjuana Kava, MD; Location: [facility]; Service: Orthopedics; Laterality: Left;    Current Outpatient Prescriptions on File Prior to Visit   Medication  Sig  Dispense  Refill   .  acetaminophen-codeine (TYLENOL #3) 300-30 MG per tablet  Take 1 tablet by mouth every 6 (six) hours as needed for moderate pain.  30 tablet  0   .  ALPRAZolam (XANAX) 0.5 MG tablet  Take one tablet by mouth every 8 hours as needed for anxiety  90 tablet  0   .  bisacodyl (DULCOLAX) 10 MG suppository  Place 1 suppository (10 mg total) rectally daily as needed for moderate constipation.  12 suppository  0   .  insulin aspart (NOVOLOG) 100 UNIT/ML injection  Inject 5 Units into the skin 3 (three) times daily with meals.  10 mL  11   .  insulin glargine (LANTUS) 100 UNIT/ML injection  Inject  10 Units into the skin at bedtime.     .  metoprolol (LOPRESSOR) 50 MG tablet  Take 1 tablet (50 mg total) by mouth 2 (two) times daily.  60 tablet  0   .  mirabegron ER (MYRBETRIQ) 25 MG TB24 tablet  Take 1 tablet (25 mg total) by mouth daily.     .  potassium chloride (K-DUR) 10 MEQ tablet  Take 10 mEq by mouth every other day.     .  pravastatin (PRAVACHOL) 10 MG tablet  Take 10 mg by mouth every morning.     .  Rivaroxaban (XARELTO) 15 MG TABS tablet  Take 1 tablet (15 mg total) by mouth daily before breakfast.  30 tablet    .  senna (SENOKOT) 8.6 MG TABS tablet  Take 1 tablet (8.6 mg total) by mouth 2 (two) times daily.  120 each  0   .  sitaGLIPtin (JANUVIA) 50 MG tablet  Take 50 mg by mouth every morning.     Marland Kitchen  UNKNOWN TO PATIENT  Take 0.5 tablets by mouth every evening. ANTIBIOTIC prescribed by Tarri Glenn (Dermatologist).        Social history; patient apparently lives in her own home. As mentioned in the history of present illness she has some form of in home service. She says she has 2 children who both live in [place].   reports that she has never smoked. She has never used smokeless tobacco. She reports that she does not drink alcohol or use illicit drugs.   family history includes Arthritis in her other; CAD in her sister; Cancer in her other; Coronary artery disease in her other; Diabetes in her other; Pneumonia in her mother .  Review of systems   Gen. No complaints of fever or chills.  Skin does not complaining of rash or itching Respiratory she is not complaining of shortness of breath  Cardiac she has a remote CABG scar but does not describe exertional shortness of breath  Abdomen no nausea vomiting or abdominal pain  GU no dysuria  Muscle skeletal-status post left hip fracture with repair but does not really complain of acute pain here-she is receiving Tylenol with codeine as needed Provided does not complaining of syncope headache or dizziness.  Psych appears to be alert  appropriate pleasan  Physical examination  Temperature 97.5 pulse 68 respirations 18 blood pressure taken manually 168/80-it appears she  has variable blood pressures ranging 833X systolically to 832N systolically with quite a bit of variability--weight is stable around 117 In general this is a pleasant elderly female in no distress sitting comfortably in her wheelchair.  Her skin is warm and dry   Respiratory clear entry bilaterally no wheezing work of breathing is normal  Cardiac; as aforementioned CABG scar. Heart sounds are normal there are no gallops no evidence of heart failure no murmurs no significant lower extremity edema  Abdomen; soft nontender with positive bowel sounds  Musculoskeletal; moves all extremities x4-- is able to stand without assistance still appears to be somewhat weak does ambulate with a walker -- surgical site left hip appears well healed .Marland Kitchen  Neurologic;  I do not see any lateralizing findings her speech is clear Mental status; continues to be pleasant and appropriate in conversation--her dementia I would say is mild/moderate .  Labs.  07/22/2014.  WBC 6.4-hemoglobin 10.2-platelets 189.  Sodium 141 potassium 4.2 BUN 25 creatinine 0.79.     Impression/plan   #1 status post left hip fracture and ORIF.--She appears to rehabilitation relatively well is receiving Tylenol with codeine for pain control apparently she did not do well with stronger narcotics-would benefit from continued therapy she will have someone with her apparently 24 hours a day and actually has had a sitter previously-will have orthopedic followup .  #2 history of atrial fibrillation with an embolic stroke for which the patient was admitted to this facility in the fall of 2014. She is back on xarelto. She had postoperative anemia with a hemoglobin of 9.5 this has improved up to over 10 per recent lab-her rate appears to be controlled she is on a beta blocker       #3 history of coronary  artery disease this does not appear to be active. She does have a history of a non-ST elevation MI. Continues onXarelto    #4 isolated suppression of the TSH with a normal free T4. There was some thought she should have a free T3 checked as well-since she is being discharged will defer followup  to primary care provider l   #5 type 2 diabetes on insulinalso Januvia. At this point relatively stable somewhat higher CBGs later in the day but would be hesitant to increase her insulin right before discharge Will defer to primary care provider   #6 hypertension-she is on Lopressor has variable systolics but would be hesitant to change medication before discharge Will defer followup to primary care provider clinically she appears stable I   #6 hyperlipidemia-Will defer any workup to her primary care provider  #7-apparently some history of overactive bladder she continues on Myrbetriq--will warrent followup with primary care provider -does not appear to be a significant issue during her stay here  #8-dementia-hhas been seen by psychiatric services here and started on Namenda--she is pleasant and appropriate this evening will warrant followup by PCP   -    [id number] note greater than 30 minutes spent on this discharge summary

## 2014-08-05 LAB — GLUCOSE, CAPILLARY
GLUCOSE-CAPILLARY: 134 mg/dL — AB (ref 70–99)
Glucose-Capillary: 297 mg/dL — ABNORMAL HIGH (ref 70–99)

## 2014-08-07 DIAGNOSIS — Z471 Aftercare following joint replacement surgery: Secondary | ICD-10-CM

## 2014-08-07 DIAGNOSIS — I10 Essential (primary) hypertension: Secondary | ICD-10-CM

## 2014-08-07 DIAGNOSIS — IMO0001 Reserved for inherently not codable concepts without codable children: Secondary | ICD-10-CM

## 2014-08-07 DIAGNOSIS — E119 Type 2 diabetes mellitus without complications: Secondary | ICD-10-CM

## 2014-08-13 ENCOUNTER — Other Ambulatory Visit: Payer: Self-pay | Admitting: Internal Medicine

## 2014-09-09 ENCOUNTER — Other Ambulatory Visit: Payer: Self-pay | Admitting: Internal Medicine

## 2014-10-01 ENCOUNTER — Inpatient Hospital Stay (HOSPITAL_COMMUNITY): Payer: Medicare Other

## 2014-10-01 ENCOUNTER — Emergency Department (HOSPITAL_COMMUNITY): Payer: Medicare Other

## 2014-10-01 ENCOUNTER — Encounter (HOSPITAL_COMMUNITY): Payer: Self-pay

## 2014-10-01 ENCOUNTER — Inpatient Hospital Stay (HOSPITAL_COMMUNITY)
Admission: EM | Admit: 2014-10-01 | Discharge: 2014-10-05 | DRG: 280 | Disposition: A | Discharge status: Home / Self Care | Payer: Medicare Other | Attending: Internal Medicine | Admitting: Internal Medicine

## 2014-10-01 DIAGNOSIS — I5043 Acute on chronic combined systolic (congestive) and diastolic (congestive) heart failure: Secondary | ICD-10-CM | POA: Diagnosis present

## 2014-10-01 DIAGNOSIS — E162 Hypoglycemia, unspecified: Secondary | ICD-10-CM

## 2014-10-01 DIAGNOSIS — G309 Alzheimer's disease, unspecified: Secondary | ICD-10-CM | POA: Diagnosis present

## 2014-10-01 DIAGNOSIS — I5021 Acute systolic (congestive) heart failure: Secondary | ICD-10-CM

## 2014-10-01 DIAGNOSIS — E1165 Type 2 diabetes mellitus with hyperglycemia: Secondary | ICD-10-CM | POA: Diagnosis present

## 2014-10-01 DIAGNOSIS — J9601 Acute respiratory failure with hypoxia: Secondary | ICD-10-CM | POA: Diagnosis present

## 2014-10-01 DIAGNOSIS — F0280 Dementia in other diseases classified elsewhere without behavioral disturbance: Secondary | ICD-10-CM | POA: Diagnosis present

## 2014-10-01 DIAGNOSIS — R42 Dizziness and giddiness: Secondary | ICD-10-CM

## 2014-10-01 DIAGNOSIS — D638 Anemia in other chronic diseases classified elsewhere: Secondary | ICD-10-CM | POA: Diagnosis present

## 2014-10-01 DIAGNOSIS — I255 Ischemic cardiomyopathy: Secondary | ICD-10-CM | POA: Diagnosis present

## 2014-10-01 DIAGNOSIS — D6489 Other specified anemias: Secondary | ICD-10-CM | POA: Diagnosis present

## 2014-10-01 DIAGNOSIS — Z66 Do not resuscitate: Secondary | ICD-10-CM | POA: Diagnosis present

## 2014-10-01 DIAGNOSIS — E119 Type 2 diabetes mellitus without complications: Secondary | ICD-10-CM

## 2014-10-01 DIAGNOSIS — E785 Hyperlipidemia, unspecified: Secondary | ICD-10-CM | POA: Diagnosis present

## 2014-10-01 DIAGNOSIS — R1110 Vomiting, unspecified: Secondary | ICD-10-CM | POA: Diagnosis present

## 2014-10-01 DIAGNOSIS — R001 Bradycardia, unspecified: Secondary | ICD-10-CM

## 2014-10-01 DIAGNOSIS — I10 Essential (primary) hypertension: Secondary | ICD-10-CM | POA: Diagnosis present

## 2014-10-01 DIAGNOSIS — E876 Hypokalemia: Secondary | ICD-10-CM

## 2014-10-01 DIAGNOSIS — R410 Disorientation, unspecified: Secondary | ICD-10-CM

## 2014-10-01 DIAGNOSIS — Z8673 Personal history of transient ischemic attack (TIA), and cerebral infarction without residual deficits: Secondary | ICD-10-CM | POA: Diagnosis not present

## 2014-10-01 DIAGNOSIS — I2510 Atherosclerotic heart disease of native coronary artery without angina pectoris: Secondary | ICD-10-CM | POA: Diagnosis present

## 2014-10-01 DIAGNOSIS — Z951 Presence of aortocoronary bypass graft: Secondary | ICD-10-CM | POA: Diagnosis not present

## 2014-10-01 DIAGNOSIS — I454 Nonspecific intraventricular block: Secondary | ICD-10-CM | POA: Diagnosis present

## 2014-10-01 DIAGNOSIS — I340 Nonrheumatic mitral (valve) insufficiency: Secondary | ICD-10-CM | POA: Diagnosis present

## 2014-10-01 DIAGNOSIS — N390 Urinary tract infection, site not specified: Secondary | ICD-10-CM

## 2014-10-01 DIAGNOSIS — R0902 Hypoxemia: Secondary | ICD-10-CM | POA: Diagnosis present

## 2014-10-01 DIAGNOSIS — I5023 Acute on chronic systolic (congestive) heart failure: Secondary | ICD-10-CM

## 2014-10-01 DIAGNOSIS — I639 Cerebral infarction, unspecified: Secondary | ICD-10-CM

## 2014-10-01 DIAGNOSIS — I679 Cerebrovascular disease, unspecified: Secondary | ICD-10-CM

## 2014-10-01 DIAGNOSIS — M1990 Unspecified osteoarthritis, unspecified site: Secondary | ICD-10-CM | POA: Diagnosis present

## 2014-10-01 DIAGNOSIS — I480 Paroxysmal atrial fibrillation: Secondary | ICD-10-CM | POA: Diagnosis present

## 2014-10-01 DIAGNOSIS — F419 Anxiety disorder, unspecified: Secondary | ICD-10-CM | POA: Diagnosis present

## 2014-10-01 DIAGNOSIS — E43 Unspecified severe protein-calorie malnutrition: Secondary | ICD-10-CM

## 2014-10-01 DIAGNOSIS — R55 Syncope and collapse: Secondary | ICD-10-CM

## 2014-10-01 DIAGNOSIS — I248 Other forms of acute ischemic heart disease: Secondary | ICD-10-CM | POA: Diagnosis present

## 2014-10-01 DIAGNOSIS — R739 Hyperglycemia, unspecified: Secondary | ICD-10-CM

## 2014-10-01 DIAGNOSIS — I447 Left bundle-branch block, unspecified: Secondary | ICD-10-CM | POA: Diagnosis present

## 2014-10-01 DIAGNOSIS — G9340 Encephalopathy, unspecified: Secondary | ICD-10-CM

## 2014-10-01 DIAGNOSIS — I214 Non-ST elevation (NSTEMI) myocardial infarction: Secondary | ICD-10-CM | POA: Diagnosis present

## 2014-10-01 DIAGNOSIS — R270 Ataxia, unspecified: Secondary | ICD-10-CM

## 2014-10-01 DIAGNOSIS — R7989 Other specified abnormal findings of blood chemistry: Secondary | ICD-10-CM

## 2014-10-01 DIAGNOSIS — J810 Acute pulmonary edema: Secondary | ICD-10-CM

## 2014-10-01 DIAGNOSIS — L309 Dermatitis, unspecified: Secondary | ICD-10-CM

## 2014-10-01 DIAGNOSIS — R0600 Dyspnea, unspecified: Secondary | ICD-10-CM

## 2014-10-01 HISTORY — DX: Anxiety disorder, unspecified: F41.9

## 2014-10-01 HISTORY — DX: Acute on chronic combined systolic (congestive) and diastolic (congestive) heart failure: I50.43

## 2014-10-01 HISTORY — DX: Unspecified dementia, unspecified severity, without behavioral disturbance, psychotic disturbance, mood disturbance, and anxiety: F03.90

## 2014-10-01 LAB — COMPREHENSIVE METABOLIC PANEL
ALT: 33 U/L (ref 0–35)
AST: 52 U/L — AB (ref 0–37)
Albumin: 3.2 g/dL — ABNORMAL LOW (ref 3.5–5.2)
Alkaline Phosphatase: 96 U/L (ref 39–117)
Anion gap: 14 (ref 5–15)
BUN: 18 mg/dL (ref 6–23)
CO2: 23 mEq/L (ref 19–32)
Calcium: 8.5 mg/dL (ref 8.4–10.5)
Chloride: 102 mEq/L (ref 96–112)
Creatinine, Ser: 0.79 mg/dL (ref 0.50–1.10)
GFR calc Af Amer: 82 mL/min — ABNORMAL LOW (ref >90)
GFR, EST NON AFRICAN AMERICAN: 71 mL/min — AB (ref >90)
Glucose, Bld: 332 mg/dL — ABNORMAL HIGH (ref 70–99)
Potassium: 4.4 mEq/L (ref 3.7–5.3)
SODIUM: 139 meq/L (ref 137–147)
Total Bilirubin: 0.6 mg/dL (ref 0.3–1.2)
Total Protein: 6.4 g/dL (ref 6.0–8.3)

## 2014-10-01 LAB — URINALYSIS, ROUTINE W REFLEX MICROSCOPIC
Bilirubin Urine: NEGATIVE
Glucose, UA: >1000 mg/dL — AB
Ketones, ur: NEGATIVE mg/dL
LEUKOCYTES UA: NEGATIVE
NITRITE: NEGATIVE
PH: 6 (ref 5.0–8.0)
Protein, ur: NEGATIVE mg/dL
SPECIFIC GRAVITY, URINE: 1.02 (ref 1.005–1.030)
Urobilinogen, UA: 0.2 mg/dL (ref 0.0–1.0)

## 2014-10-01 LAB — CBC WITH DIFFERENTIAL/PLATELET
BASOS ABS: 0 10*3/uL (ref 0.0–0.1)
Basophils Relative: 0 % (ref 0–1)
EOS PCT: 2 % (ref 0–5)
Eosinophils Absolute: 0.3 10*3/uL (ref 0.0–0.7)
HCT: 32.9 % — ABNORMAL LOW (ref 36.0–46.0)
Hemoglobin: 10.9 g/dL — ABNORMAL LOW (ref 12.0–15.0)
LYMPHS PCT: 12 % (ref 12–46)
Lymphs Abs: 1.7 10*3/uL (ref 0.7–4.0)
MCH: 30.1 pg (ref 26.0–34.0)
MCHC: 33.1 g/dL (ref 30.0–36.0)
MCV: 90.9 fL (ref 78.0–100.0)
Monocytes Absolute: 0.7 10*3/uL (ref 0.1–1.0)
Monocytes Relative: 5 % (ref 3–12)
NEUTROS PCT: 81 % — AB (ref 43–77)
Neutro Abs: 11.1 10*3/uL — ABNORMAL HIGH (ref 1.7–7.7)
PLATELETS: 211 10*3/uL (ref 150–400)
RBC: 3.62 MIL/uL — ABNORMAL LOW (ref 3.87–5.11)
RDW: 14.3 % (ref 11.5–15.5)
WBC: 13.7 10*3/uL — ABNORMAL HIGH (ref 4.0–10.5)

## 2014-10-01 LAB — GLUCOSE, CAPILLARY
GLUCOSE-CAPILLARY: 88 mg/dL (ref 70–99)
Glucose-Capillary: 134 mg/dL — ABNORMAL HIGH (ref 70–99)

## 2014-10-01 LAB — HEMOGLOBIN A1C
HEMOGLOBIN A1C: 6.6 % — AB (ref <5.7)
MEAN PLASMA GLUCOSE: 143 mg/dL — AB (ref <117)

## 2014-10-01 LAB — LACTIC ACID, PLASMA: LACTIC ACID, VENOUS: 3.9 mmol/L — AB (ref 0.5–2.2)

## 2014-10-01 LAB — PROTIME-INR
INR: 1.25 (ref 0.00–1.49)
Prothrombin Time: 15.8 seconds — ABNORMAL HIGH (ref 11.6–15.2)

## 2014-10-01 LAB — TSH: TSH: 1.68 u[IU]/mL (ref 0.350–4.500)

## 2014-10-01 LAB — TROPONIN I
TROPONIN I: 1.44 ng/mL — AB (ref <0.30)
Troponin I: 2.83 ng/mL (ref <0.30)

## 2014-10-01 LAB — LIPASE, BLOOD: Lipase: 44 U/L (ref 11–59)

## 2014-10-01 LAB — CBG MONITORING, ED: Glucose-Capillary: 282 mg/dL — ABNORMAL HIGH (ref 70–99)

## 2014-10-01 LAB — PRO B NATRIURETIC PEPTIDE: PRO B NATRI PEPTIDE: 3877 pg/mL — AB (ref 0–450)

## 2014-10-01 LAB — URINE MICROSCOPIC-ADD ON

## 2014-10-01 MED ORDER — LEVALBUTEROL HCL 0.63 MG/3ML IN NEBU: 0.6300 mg | INHALATION_SOLUTION | Freq: Four times a day (QID) | RESPIRATORY_TRACT | Status: DC | PRN

## 2014-10-01 MED ORDER — ASPIRIN 81 MG PO CHEW
81.0000 mg | CHEWABLE_TABLET | Freq: Every day | ORAL | Status: DC
  Administered 2014-10-01 – 2014-10-05 (×5): 81 mg via ORAL
  Filled 2014-10-01 (×5): qty 1

## 2014-10-01 MED ORDER — FUROSEMIDE 10 MG/ML IJ SOLN
20.0000 mg | Freq: Once | INTRAMUSCULAR | Status: AC
  Administered 2014-10-01: 20 mg via INTRAVENOUS
  Filled 2014-10-01: qty 2

## 2014-10-01 MED ORDER — FUROSEMIDE 10 MG/ML IJ SOLN
20.0000 mg | Freq: Two times a day (BID) | INTRAMUSCULAR | Status: DC
  Administered 2014-10-01 – 2014-10-04 (×6): 20 mg via INTRAVENOUS
  Filled 2014-10-01 (×6): qty 2

## 2014-10-01 MED ORDER — ALPRAZOLAM 0.25 MG PO TABS
0.2500 mg | ORAL_TABLET | Freq: Three times a day (TID) | ORAL | Status: DC | PRN
  Administered 2014-10-01 – 2014-10-02 (×3): 0.25 mg via ORAL
  Filled 2014-10-01 (×3): qty 1

## 2014-10-01 MED ORDER — PIPERACILLIN-TAZOBACTAM 3.375 G IVPB 30 MIN: 3.3750 g | Freq: Once | INTRAVENOUS | Status: DC

## 2014-10-01 MED ORDER — OXYCODONE HCL 5 MG PO TABS
5.0000 mg | ORAL_TABLET | ORAL | Status: DC | PRN
  Administered 2014-10-01 – 2014-10-02 (×2): 5 mg via ORAL
  Filled 2014-10-01 (×2): qty 1

## 2014-10-01 MED ORDER — ONDANSETRON HCL 4 MG PO TABS: 4.0000 mg | ORAL_TABLET | Freq: Four times a day (QID) | ORAL | Status: DC | PRN

## 2014-10-01 MED ORDER — ACETAMINOPHEN 650 MG RE SUPP: 650.0000 mg | Freq: Four times a day (QID) | RECTAL | Status: DC | PRN

## 2014-10-01 MED ORDER — RIVAROXABAN 15 MG PO TABS
15.0000 mg | ORAL_TABLET | Freq: Every day | ORAL | Status: DC
  Administered 2014-10-02 – 2014-10-05 (×4): 15 mg via ORAL
  Filled 2014-10-01 (×4): qty 1

## 2014-10-01 MED ORDER — GUAIFENESIN-DM 100-10 MG/5ML PO SYRP: 5.0000 mL | ORAL_SOLUTION | ORAL | Status: DC | PRN

## 2014-10-01 MED ORDER — POTASSIUM CHLORIDE CRYS ER 10 MEQ PO TBCR
10.0000 meq | EXTENDED_RELEASE_TABLET | ORAL | Status: DC
  Administered 2014-10-01 – 2014-10-03 (×2): 10 meq via ORAL
  Filled 2014-10-01 (×2): qty 1

## 2014-10-01 MED ORDER — PRAVASTATIN SODIUM 10 MG PO TABS
10.0000 mg | ORAL_TABLET | Freq: Every morning | ORAL | Status: DC
  Administered 2014-10-01 – 2014-10-05 (×5): 10 mg via ORAL
  Filled 2014-10-01 (×5): qty 1

## 2014-10-01 MED ORDER — ONDANSETRON HCL 4 MG/2ML IJ SOLN: 4.0000 mg | Freq: Four times a day (QID) | INTRAMUSCULAR | Status: DC | PRN

## 2014-10-01 MED ORDER — INSULIN GLARGINE 100 UNIT/ML ~~LOC~~ SOLN
10.0000 [IU] | Freq: Every day | SUBCUTANEOUS | Status: DC
  Administered 2014-10-01 – 2014-10-03 (×3): 10 [IU] via SUBCUTANEOUS
  Filled 2014-10-01 (×5): qty 0.1

## 2014-10-01 MED ORDER — INSULIN ASPART 100 UNIT/ML ~~LOC~~ SOLN
0.0000 [IU] | Freq: Every day | SUBCUTANEOUS | Status: DC
  Administered 2014-10-02 – 2014-10-03 (×2): 2 [IU] via SUBCUTANEOUS

## 2014-10-01 MED ORDER — ACETAMINOPHEN 325 MG PO TABS
650.0000 mg | ORAL_TABLET | Freq: Four times a day (QID) | ORAL | Status: DC | PRN
Start: 2014-10-01 — End: 2014-10-05

## 2014-10-01 MED ORDER — LISINOPRIL 5 MG PO TABS
5.0000 mg | ORAL_TABLET | Freq: Every day | ORAL | Status: DC
  Administered 2014-10-01 – 2014-10-05 (×5): 5 mg via ORAL
  Filled 2014-10-01 (×6): qty 1

## 2014-10-01 MED ORDER — BISACODYL 10 MG RE SUPP: 10.0000 mg | Freq: Every day | RECTAL | Status: DC | PRN

## 2014-10-01 MED ORDER — INSULIN ASPART 100 UNIT/ML ~~LOC~~ SOLN
6.0000 [IU] | Freq: Once | SUBCUTANEOUS | Status: AC
  Administered 2014-10-01: 6 [IU] via SUBCUTANEOUS
  Filled 2014-10-01: qty 1

## 2014-10-01 MED ORDER — LORAZEPAM 0.5 MG PO TABS
0.5000 mg | ORAL_TABLET | Freq: Once | ORAL | Status: AC
  Administered 2014-10-01: 0.5 mg via ORAL
  Filled 2014-10-01: qty 1

## 2014-10-01 MED ORDER — ONDANSETRON HCL 4 MG/2ML IJ SOLN: 4.0000 mg | Freq: Three times a day (TID) | INTRAMUSCULAR | Status: DC | PRN

## 2014-10-01 MED ORDER — ENOXAPARIN SODIUM 40 MG/0.4ML ~~LOC~~ SOLN
40.0000 mg | SUBCUTANEOUS | Status: DC
  Filled 2014-10-01: qty 0.4

## 2014-10-01 MED ORDER — INSULIN ASPART 100 UNIT/ML ~~LOC~~ SOLN
0.0000 [IU] | Freq: Three times a day (TID) | SUBCUTANEOUS | Status: DC
  Administered 2014-10-02: 3 [IU] via SUBCUTANEOUS
  Administered 2014-10-03: 2 [IU] via SUBCUTANEOUS
  Administered 2014-10-03: 5 [IU] via SUBCUTANEOUS
  Administered 2014-10-03: 2 [IU] via SUBCUTANEOUS
  Administered 2014-10-04: 3 [IU] via SUBCUTANEOUS
  Administered 2014-10-04: 8 [IU] via SUBCUTANEOUS
  Administered 2014-10-05: 3 [IU] via SUBCUTANEOUS
  Administered 2014-10-05: 8 [IU] via SUBCUTANEOUS

## 2014-10-01 MED ORDER — METOPROLOL TARTRATE 50 MG PO TABS
50.0000 mg | ORAL_TABLET | Freq: Two times a day (BID) | ORAL | Status: DC
  Administered 2014-10-01 – 2014-10-05 (×8): 50 mg via ORAL
  Filled 2014-10-01 (×9): qty 1

## 2014-10-01 MED ORDER — FAMOTIDINE 20 MG PO TABS
20.0000 mg | ORAL_TABLET | Freq: Every day | ORAL | Status: DC
  Administered 2014-10-01 – 2014-10-05 (×5): 20 mg via ORAL
  Filled 2014-10-01 (×5): qty 1

## 2014-10-01 MED ORDER — SENNA 8.6 MG PO TABS
1.0000 | ORAL_TABLET | Freq: Two times a day (BID) | ORAL | Status: DC
  Administered 2014-10-01 – 2014-10-05 (×7): 8.6 mg via ORAL
  Filled 2014-10-01 (×8): qty 1

## 2014-10-01 MED ORDER — SODIUM CHLORIDE 0.9 % IV BOLUS (SEPSIS)
1000.0000 mL | Freq: Once | INTRAVENOUS | Status: AC
  Administered 2014-10-01: 1000 mL via INTRAVENOUS

## 2014-10-01 NOTE — Consult Note (Signed)
CARDIOLOGY CONSULT NOTE   Patient ID: Alicia Mason MRN: [id number] DOB/AGE: 1922-04-07 80 y.o.  Admit Date: 10/01/2014 Referring Physician: PTH-Fisher MD Primary Physician: Glo Herring., MD Consulting Cardiologist: Rozann Lesches MD Primary Cardiologist: New (Formerly Dr. Verl Blalock) Reason for Consultation: CHF, NSTEMI  Clinical Summary Alicia Mason is a 80 y.o.female with known CAD, S/P CABG (LIMA to LAD, SVG to OM1 and OM2 and SVG to PDA in 1999) , ICM most recent EF of 45% per echo in 2014, CVA, DM Type II admitted originally with abdominal discomfort and anxiety. She was hypoxic on RA at 88%, with abdominal with chest  demonstrating pulmonary edema and small bilateral pleural effusions.   She states that she was doing fine until this am when she was sitting in her chair watching television and became very nervous. She had some abdominal pain. CNA who care's for her during the day states that she has been more tired lately over the last week, with some mild shortness of breath at time, and mild ankle edema. The caregiver states that Alicia Mason has been eating a lot of soups and crackers over the last few weeks. The patient denies chest pain, PND, orthopena of DOE, but the caregiver states she noticed some mild dyspnea   Initial labs demonstrated normal troponin at <0.30, but follow up troponin elevated at 2.83. EKG demonstrated intraventricular conduction delay, with T-wave depression in V4-V6. Pro-BNP elevated at 3,877. WBC elevated at 13.7. She was afebrile. UA revealed glucose >1000. Blood glucose 332. AST elevated at 52. She was treated with lasix 20 mg IV, insulin and IV fluids. She has diuresed 1.108 cc since admission per I/O. We are asked to see concerning elevated troponin and CHF.  Echocardiogram is pending.    Allergies  Allergen Reactions  . Cephalexin     NOT SURE OF THE REACTION, BUT WAS TOLD NOT TO TAKE IT. Granddaughter mentioned that patient had hives, rash.  Denies anaphylactic reaction.    Medications Scheduled Medications: . aspirin  81 mg Oral Daily  . famotidine  20 mg Oral Daily  . furosemide  20 mg Intravenous Q12H  . insulin aspart  0-15 Units Subcutaneous TID WC  . insulin aspart  0-5 Units Subcutaneous QHS  . insulin glargine  10 Units Subcutaneous QHS  . lisinopril  5 mg Oral Daily  . metoprolol  50 mg Oral BID  . potassium chloride  10 mEq Oral QODAY  . pravastatin  10 mg Oral q morning - 10a  . [START ON 10/02/2014] Rivaroxaban  15 mg Oral QAC breakfast  . senna  1 tablet Oral BID    PRN Medications: acetaminophen **OR** acetaminophen, ALPRAZolam, bisacodyl, guaiFENesin-dextromethorphan, levalbuterol, ondansetron **OR** ondansetron (ZOFRAN) IV, oxyCODONE   Past Medical History  Diagnosis Date  . Essential hypertension, benign   . Type 2 diabetes mellitus   . Coronary atherosclerosis of native coronary artery 1999    CABG LIMA to LAD, SVG to OM1 and OM2 and SVG to PDA in 1999,   . Closed fracture of right humerus     Managed conservatively 2011  . Cerebrovascular disease   . Seasonal allergies   . Arthritis   . Vertigo   . Ischemic cardiomyopathy     45-50% 07/2013/ EF 40% per Echo 06/2011.  Marland Kitchen Near syncope   . NSTEMI (non-ST elevated myocardial infarction)     06/2011.  . Diabetes mellitus   . Acute ischemic stroke 08/04/2013    Right MCA stroke  .  Atrial fibrillation 05/28/2009    Paroxysmal    . Dyslipidemia   . Chronic anxiety     Past Surgical History  Procedure Laterality Date  . Coronary artery bypass graft  1999    LIMA to LAD, SVG to OM1 and OM2, SVG to PDA  . Cataract surgery    . Abdominal hysterectomy      WITH BSO.  . Orif hip fracture Left 05/30/2014    Procedure: COMPRESSION HIP SCREW LEFT HIP;  Surgeon: Sanjuana Kava, MD;  Location: [facility];  Service: Orthopedics;  Laterality: Left;    Family History  Problem Relation Age of Onset  . CAD Sister     Died with MI age 44  . Pneumonia Mother     . Arthritis Other   . Coronary artery disease Other   . Diabetes Other   . Cancer Other     Social History [name] reports that she has never smoked. She has never used smokeless tobacco. [name] reports that she does not drink alcohol.  Review of Systems Complete review of systems negative except as outlined above.  Physical Examination Blood pressure 140/55, pulse 60, temperature 98 F (36.7 C), temperature source Rectal, resp. rate 18, height 5\' 5"  (1.651 m), weight 121 lb 11.1 oz (55.2 kg), SpO2 96 %.  Intake/Output Summary (Last 24 hours) at 10/01/14 1635 Last data filed at 10/01/14 1200  Gross per 24 hour  Intake    240 ml  Output   1108 ml  Net   -868 ml    Telemetry: Sinus bradycardia rate of 60 bpm.   GEN: Resting comfortably on oxygen via [place] HEENT: Conjunctiva and lids normal, oropharynx clear with moist mucosa. Neck: Supple, mildly elevated JVP no carotid bruits, no thyromegaly. Lungs: Bibasilar crackles, some up to middle lobe on the right, no wheezes or coughing with inspiration. Cardiac: Regular rate and rhythm,soft 1/6 systolic murmur, no pericardial rub. Abdomen: Soft, nontender, no hepatomegaly, bowel sounds present, no guarding or rebound. Extremities: 1+ pitting edema at the ankles, distal pulses 1+, dressing to abrasion on right pretibial region.. Skin: Warm and dry. Musculoskeletal: No kyphosis. Neuropsychiatric: Alert and oriented x3, affect grossly appropriate. Some memory issues.   Prior Cardiac Testing/Procedures 1. NM Study 2012 Probably negative pharmacologic stress nuclear myocardial study revealing normal left ventricular size and overall systolic function and no stress induced EKG abnormalities. The fairly profound but small defect at the base of the inferior/inferolateral wall appears to represent superimposed breast and diaphragmatic attenuation. A small degree of scarring in this region cannot be unequivocally excluded. No  evidence for ischemia.  2. Echocardiogram 07/2013 Left ventricle: The cavity size was normal. There was moderate basal hypertrophy of the septum. Systolic function was mildly reduced. The estimated ejection fraction was in the range of 45% to 50%. There is hypokinesis of the basal-midinferolateral and inferior myocardium. Doppler parameters are consistent with abnormal left ventricular relaxation (grade 1 diastolic dysfunction). Doppler parameters are consistent with high ventricular filling pressure. - Aortic valve: Mildly calcified annulus. Trileaflet; mildly calcified leaflets. Mild regurgitation. - Mitral valve: Calcified annulus. Mild regurgitation - appears moderate in apical 2 chamber view. - Left atrium: The atrium was mildly dilated. - Right atrium: Central venous pressure: 23mm Hg (est). - Atrial septum: No defect or patent foramen ovale was identified. - Tricuspid valve: Trivial regurgitation. - Pulmonary arteries: PA peak pressure: 64mm Hg (S).  Lab Results  Basic Metabolic Panel:  Recent Labs Lab 10/01/14 0640  NA 139  K 4.4  CL 102  CO2 23  GLUCOSE 332*  BUN 18  CREATININE 0.79  CALCIUM 8.5    Liver Function Tests:  Recent Labs Lab 10/01/14 0640  AST 52*  ALT 33  ALKPHOS 96  BILITOT 0.6  PROT 6.4  ALBUMIN 3.2*    CBC:  Recent Labs Lab 10/01/14 0640  WBC 13.7*  NEUTROABS 11.1*  HGB 10.9*  HCT 32.9*  MCV 90.9  PLT 211    Cardiac Enzymes:  Recent Labs Lab 10/01/14 0640 10/01/14 1238  TROPONINI <0.30 2.83*    BNP: 3877.0  Radiology: Dg Abd Acute W/chest  10/01/2014   CLINICAL DATA:  Vomiting.  EXAM: ACUTE ABDOMEN SERIES (ABDOMEN 2 VIEW & CHEST 1 VIEW)  COMPARISON:  05/28/2014  FINDINGS: There is diffuse interstitial and airspace opacity which is fairly symmetric. Small bilateral pleural effusion. The patient is status post CABG. Mild cardiomegaly, stable from previous.  Nonobstructive bowel gas pattern. No  evidence of pneumoperitoneum. The concerning intra-abdominal mass effect or calcification. Status post proximal left femur ORIF.  IMPRESSION: 1. Pulmonary edema and small pleural effusions. 2. No evidence of bowel obstruction.   Electronically Signed   By: Jorje Guild M.D.   On: 10/01/2014 07:59    ECG: NSR with intraventricular conduction delay, T-wave depression in the lateral leads V4-V5-V6 more prominent than other leads.    Impression and Recommendations  1. NSTEMI: Troponin of 2.83 on second cycle. Abnormal EKG with depressed ST segments in the lateral precordial leads. No reciprocal changes. She denies chest pain, but had abdominal pain and some "nervousness: prior to coming to ER.  She remains on metoprolol 50 mg BID, ASA and has recently been started on lisinopril 5 mg daily by PTH. Echo is pending.   With age and co-morbidities, would not plan aggressive invasive testing unless she becomes more symptomatic or has evidence of evolving MI. Continue medical management. Troponin elevation could also be related to hypoxia.   2. Acute Systolic CHF:  Evidence of pulmonary edema with bilateral pleural effusions on abdominal X-ray. She is diuresing. Was hypoxic on admission with Sat of 88%, contributing to troponin elevation. Echo for change in LV fx will assist with medical management. Continue IV lasix. Most recent recorded wt was during hospitalization for hip fx in July of 2015. Wt then was 119 lbs. Wt on admission was listed at 130 lbs, but follow up wt states 121 lbs.   3. CAD: Known history of  4 vessel CABG in 1999, with follow up NM study in 2012 that was negative for ischemia. Continue BB, ASA, statin.   4. Hx of CVA: Continues on rivaroxaban 15 mg daily. Creatinine 0.79. GFR 71. Last carotid US demonstrated minor atherosclerosis, degree of narrowing < 50% in 07/2013.  5. PAF: Continues on low dose rivoraxaban. No evidence of afib on EKG or telemetry at present.   6. Uncontrolled  Diabetes Type II: Elevated blood glucose on admission with high levels in urine. Now on insulin per PTH.  7. Hx of Left Hip Fx with ORIF in July 2015:Ambulaiton has been significantly affected, however,  she is normally active, using her walker for ambulation. Caregiver states she has been slower than normal and more tired.    Signed: Phill Myron. Lawrence NP  10/01/2014, 4:35 PM Co-Sign MD   Attending note:  Patient seen and examined. Reviewed available records and also spoke with the patient's caregiver, Vanita Ingles. She stays with Ms Karle Starch eight hours each day of the week  at her home. She is admitted to the hospital after an episode of feeling uneasy earlier today. No specific chest pain, some shortness of breath and abdominal discomfort. She has apparently been more short of breath with usual ADLs over the last few weeks. No problems with medication compliance per caregiver. Chest x-ray does show pulmonary edema and small pleural effusions. Pro-BNP elevated at 3877. Second troponin up to 2.8 in the absence of chest pain. ECG shows incomplete left bundle-branch block. IVCD is old. She remains in sinus rhythm now with history of PAF, anticoagulated with Xarelto, and on beta blocker. Agree with intravenous diuresis for management of suspected acute on chronic systolic heart failure associated with demand ischemia. Cycle cardiac marker trend and follow-up echocardiogram. Anticipate medical therapy without invasive cardiac testing at this time.  Satira Sark, M.D., F.A.C.C.

## 2014-10-01 NOTE — Care Management Utilization Note (Signed)
UR review complete.  

## 2014-10-01 NOTE — Progress Notes (Signed)
CRITICAL VALUE ALERT  Critical value received:  Troponin 2.8  Date of notification:  10/01/2014  Time of notification:  1430  Critical value read back:Yes.    Nurse who received alert:  Roselie Skinner RN  MD notified (1st page):  Fisher  Time of first page:  1430  MD notified (2nd page):  Time of second page:  Responding MD:  Caryn Section  Time MD responded:  [phone]  Cardiology consulted

## 2014-10-01 NOTE — ED Notes (Signed)
Attempted to call report, nurse will call back.

## 2014-10-01 NOTE — H&P (Signed)
[facility] History and Physical  KIMANH TEMPLEMAN ZCH:[id number] DOB: 1922-06-30 DOA: 10/01/2014  Referring physician: ED physician, Dr. Dina Rich PCP: Glo Herring., MD   Chief Complaint: nervousness; "I got so nervous".   HPI: Alicia Mason is a 80 y.o. female  with a history of ischemic cardiomyopathy with an ejection fraction of 45-50% per echo in 06/2011, ischemic right brain stroke in 07/2013, type 2 diabetes mellitus, and osteoarthritis, who presented to the emergency department this morning with a complaint of feeling anxious and nervous. It was reported by the ED, that the patient complained of nausea and vomiting at home, but the patient does not endorse this. She reports no history of nausea and vomiting overnight or this morning. Her major complaint is feeling nervous. She says that she has been worried about her home health aide who had been sick recently. She denies shortness of breath, chest pain, chest congestion, cough, fever, or chills. She denies orthopnea symptoms or any  Increase in abdominal girth or leg swelling. She denies abdominal pain, nausea, vomiting, or diarrhea. Her home health aide prepares her medications for her each morning. She admitted not taking her insulin this morning or any other medications because she was transferred to the ED. She does not check her blood sugars at home, but her home health aide does. She does not know what her normal range is.  In the ED, she was mildly hypertensive and mildly tachypneic. Her oxygen saturation was 88% on room air. She was afebrile.Her acute abdominal series revealed pulmonary edema and small pleural effusions, but no evidence of bowel obstruction. Her lab data were significant for a venous glucose of 332, normal troponin I, proBNP of 3877, CBC of 13.7, lactic acid of 3.9, hemoglobin of 10.9. Her urinalysis revealed no nitrites or wbcs. Her lipase and liver transaminases were within normal limits. Her EKG revealed  normal sinus rhythm, heart rate of 80 bpm, and intraventricular block. She is being admitted for further evaluation and management.  Review of Systems:  As above in history present illness. Otherwise positive for anxiousness. Otherwise negative.  Past Medical History  Diagnosis Date  . Essential hypertension, benign   . Type 2 diabetes mellitus   . Coronary atherosclerosis of native coronary artery     Multivessel  . Closed fracture of right humerus     Managed conservatively 2011  . Cerebrovascular disease   . Seasonal allergies   . Arthritis   . Vertigo   . Ischemic cardiomyopathy     45-50% 07/2013/ EF 40% per Echo 06/2011.  Marland Kitchen Near syncope   . NSTEMI (non-ST elevated myocardial infarction)     06/2011.  . Diabetes mellitus   . Acute ischemic stroke 08/04/2013    Right MCA stroke  . Atrial fibrillation 05/28/2009    Paroxysmal    . Dyslipidemia 08/07/2013  . Chronic anxiety 10/01/2014   Past Surgical History  Procedure Laterality Date  . Coronary artery bypass graft  1999    LIMA to LAD, SVG to OM1 and OM2, SVG to PDA  . Cataract surgery    . Abdominal hysterectomy      WITH BSO.  . Orif hip fracture Left 05/30/2014    Procedure: COMPRESSION HIP SCREW LEFT HIP;  Surgeon: Sanjuana Kava, MD;  Location: [facility];  Service: Orthopedics;  Laterality: Left;   Social History: she is widowed. Her only son died at 64 years of age of heart disease. She has a home health aide who comes in  from 9 AM to 5 PM 5 days a week. She lives alone. She relates with a walker. She denies tobacco, alcohol, and illicit drug use.  Allergies  Allergen Reactions  . Cephalexin     NOT SURE OF THE REACTION, BUT WAS TOLD NOT TO TAKE IT. Granddaughter mentioned that patient had hives, rash. Denies anaphylactic reaction.    Family History  Problem Relation Age of Onset  . CAD Sister     Died with MI age 34  . Pneumonia Mother   . Arthritis Other   . Coronary artery disease Other   . Diabetes Other   .  Cancer Other      Prior to Admission medications   Medication Sig Start Date End Date Taking? Authorizing Provider  acetaminophen-codeine (TYLENOL #3) 300-30 MG per tablet Take one tablet by mouth half hour prior to therapy and every 4 hours as needed for pain 07/02/14  Yes Lauree Chandler, NP  ALPRAZolam Duanne Moron) 0.5 MG tablet Take one tablet by mouth every 8 hours as needed for anxiety 06/01/14  Yes Nita Sells, MD  bisacodyl (DULCOLAX) 10 MG suppository Place 1 suppository (10 mg total) rectally daily as needed for moderate constipation. 06/01/14  Yes Nita Sells, MD  insulin aspart (NOVOLOG) 100 UNIT/ML injection Inject 5 Units into the skin 3 (three) times daily with meals. 06/01/14  Yes Nita Sells, MD  insulin glargine (LANTUS) 100 UNIT/ML injection Inject 10 Units into the skin at bedtime.   Yes Historical Provider, MD  metoprolol (LOPRESSOR) 50 MG tablet Take 1 tablet (50 mg total) by mouth 2 (two) times daily. 06/01/14  Yes Nita Sells, MD  mirabegron ER (MYRBETRIQ) 25 MG TB24 tablet Take 1 tablet (25 mg total) by mouth daily. 08/07/13  Yes Rexene Alberts, MD  potassium chloride (K-DUR) 10 MEQ tablet Take 10 mEq by mouth every other day.   Yes Historical Provider, MD  pravastatin (PRAVACHOL) 10 MG tablet Take 10 mg by mouth every morning.    Yes Historical Provider, MD  Rivaroxaban (XARELTO) 15 MG TABS tablet Take 1 tablet (15 mg total) by mouth daily before breakfast. 08/07/13  Yes Rexene Alberts, MD  senna (SENOKOT) 8.6 MG TABS tablet Take 1 tablet (8.6 mg total) by mouth 2 (two) times daily. 06/01/14  Yes Nita Sells, MD  sitaGLIPtin (JANUVIA) 50 MG tablet Take 50 mg by mouth every morning.   Yes Historical Provider, MD  UNKNOWN TO PATIENT Take 0.5 tablets by mouth every evening. ANTIBIOTIC prescribed by Tarri Glenn (Dermatologist).    Historical Provider, MD   Physical Exam: Filed Vitals:   10/01/14 0930 10/01/14 1000 10/01/14 1019 10/01/14 1111  BP:  144/60 114/44  153/76  Pulse:    59  Temp:    97.9 F (36.6 C)  TempSrc:      Resp:  20    Height:    5\' 5"  (1.651 m)  Weight:    55.2 kg (121 lb 11.1 oz)  SpO2:   99% 94%    Wt Readings from Last 3 Encounters:  10/01/14 55.2 kg (121 lb 11.1 oz)  05/28/14 54.3 kg (119 lb 11.4 oz)  08/07/13 69.3 kg (152 lb 12.5 oz)    General:  Appears calm and comfortable; no acute distress. Eyes: PERRL, normal lids, irises & conjunctiva ENT: oropharynx mildly dry; no posterior exudates or erythema. Neck:mild JVD; no thyromegaly or nuchal rigidity. Cardiovascular:S1, S2, with occasional ectopic beat and 2/6 systolic murmur. Telemetry: SR, no arrhythmias  Respiratory: breathing is nonlabored;  bibasilar crackles noted. Abdomen: soft, positive bowel sounds, soft, nontender, nondistended. Skin: multiple excoriations and healing superficial ulcers on both pretibial surfaces of both legs. Musculoskeletal: grossly normal tone BUE/BLE; no acute hot red joints. Psychiatric: grossly normal mood and affect, speech fluent and appropriate Neurologic: grossly non-focal; she is alert and oriented 2. Cranial nerves II through XII are grossly intact.          Labs on Admission:  Basic Metabolic Panel:  Recent Labs Lab 10/01/14 0640  NA 139  K 4.4  CL 102  CO2 23  GLUCOSE 332*  BUN 18  CREATININE 0.79  CALCIUM 8.5   Liver Function Tests:  Recent Labs Lab 10/01/14 0640  AST 52*  ALT 33  ALKPHOS 96  BILITOT 0.6  PROT 6.4  ALBUMIN 3.2*    Recent Labs Lab 10/01/14 0640  LIPASE 44   No results for input(s): AMMONIA in the last 168 hours. CBC:  Recent Labs Lab 10/01/14 0640  WBC 13.7*  NEUTROABS 11.1*  HGB 10.9*  HCT 32.9*  MCV 90.9  PLT 211   Cardiac Enzymes:  Recent Labs Lab 10/01/14 0640  TROPONINI <0.30    BNP (last 3 results)  Recent Labs  10/01/14 0801  PROBNP 3877.0*   CBG:  Recent Labs Lab 10/01/14 0608 10/01/14 1150  GLUCAP 282* 88     Radiological Exams on Admission: Dg Abd Acute W/chest  10/01/2014   CLINICAL DATA:  Vomiting.  EXAM: ACUTE ABDOMEN SERIES (ABDOMEN 2 VIEW & CHEST 1 VIEW)  COMPARISON:  05/28/2014  FINDINGS: There is diffuse interstitial and airspace opacity which is fairly symmetric. Small bilateral pleural effusion. The patient is status post CABG. Mild cardiomegaly, stable from previous.  Nonobstructive bowel gas pattern. No evidence of pneumoperitoneum. The concerning intra-abdominal mass effect or calcification. Status post proximal left femur ORIF.  IMPRESSION: 1. Pulmonary edema and small pleural effusions. 2. No evidence of bowel obstruction.   Electronically Signed   By: Jorje Guild M.D.   On: 10/01/2014 07:59    EKG: Independently reviewed. As above in history present illness.  Assessment/Plan Principal Problem:   Acute on chronic systolic heart failure Active Problems:   Ischemic cardiomyopathy   Osteoarthritis   DM type 2 (diabetes mellitus, type 2)   Paroxysmal atrial fibrillation   Essential hypertension   History of arterial ischemic stroke   Chronic anxiety   1. Acute on chronic systolic heart failure. The patient has ischemic cardiomyopathy with an ejection fraction of 45-50% per echo 07/2013. She does not appear to have significant peripheral vascular edema on exam. Lasix is not part of her medication regimen. She is already on a beta blocker and she takes Xarelto for chronic atrial fibrillation. She received IV Lasix in the ED. We'll continue IV Lasix at 20 mg every 12 hours. Will insert a Foley catheter for strict I's and O's. Will order a 2-D echocardiogram, troponin I 3, and TSH for further evaluation. 2. Coronary artery disease/ischemic cardiomyopathy.status post CABG in 1999. Status post non-ST elevation myocardial infarction in 06/2011. She is unsure if she takes an aspirin daily. As above, she is on a beta blocker and Xarelto. These will be continued. As above, will order  cardiac enzymes and a 2-D echocardiogram. Her EKG is abnormal with an intraventricular block. We'll consider cardiology consult, but will wait for the cardiac enzymes and 2-D echocardiogram results. 3. Hypertension. Currently stable on metoprolol. 4. Paroxysmal atrial fibrillation. 5. Hyperlipidemia. Will continue statin. We'll order a fasting lipid  profile. 6. History of ischemic stroke. We'll continue Xarelto. 7. Type 2 diabetes mellitus. She is treated chronically with Januvia, Lantus, and NovoLog. Her CBGs appear elevated. We'll hold Januvia and start sliding scale NovoLog and Lantus. 8. Anxiousness/chronic anxiety. Will continue when necessary Xanax.    Code Status: full code DVT Prophylaxis: Xarelto Family Communication: family not available Disposition Plan: discharge with clinically appropriate.  Time spent: 1 hour  [facility] Pager [phone]

## 2014-10-01 NOTE — ED Notes (Signed)
Patient via [facility] c/o Nausea and vomiting that started at midnight. [facility] gave 4mg  zofran en route. Patient A&O.

## 2014-10-01 NOTE — ED Provider Notes (Signed)
CSN: [id number]     Arrival date & time 10/01/14  3354 History   First MD Initiated Contact with Patient 10/01/14 0601     Chief Complaint  Patient presents with  . Emesis     (Consider location/radiation/quality/duration/timing/severity/associated sxs/prior Treatment) HPI  This is a 80 year old female with history of hypertension, diabetes, coronary artery disease status post CABG and ischemic cardiomyopathy with an EF of 45-50% who presents with nausea, vomiting, and hyperglycemia. Patient reports that she went to bed last night and was feeling "fine." She had onset of nausea and vomiting around midnight. She denies any abdominal pain, chest pain, shortness of breath. Reports that her blood sugars are normally around 100. For EMS blood sugar was 398. She was given Zofran and 1 L of fluids. Patient currently is only endorsing nausea. Denies any recent fevers or urinary symptoms.  Past Medical History  Diagnosis Date  . Essential hypertension, benign   . Type 2 diabetes mellitus   . Coronary atherosclerosis of native coronary artery     Multivessel  . Closed fracture of right humerus     Managed conservatively 2011  . Cerebrovascular disease   . Seasonal allergies   . Arthritis   . Vertigo   . Ischemic cardiomyopathy     45-50% 07/2013/ EF 40% per Echo 06/2011.  Marland Kitchen Near syncope   . NSTEMI (non-ST elevated myocardial infarction)     06/2011.  . Diabetes mellitus   . Acute ischemic stroke 08/04/2013    Right MCA stroke  . Atrial fibrillation 05/28/2009    Paroxysmal    . Dyslipidemia 08/07/2013   Past Surgical History  Procedure Laterality Date  . Coronary artery bypass graft  1999    LIMA to LAD, SVG to OM1 and OM2, SVG to PDA  . Cataract surgery    . Abdominal hysterectomy      WITH BSO.  . Orif hip fracture Left 05/30/2014    Procedure: COMPRESSION HIP SCREW LEFT HIP;  Surgeon: Sanjuana Kava, MD;  Location: [facility];  Service: Orthopedics;  Laterality: Left;   Family History   Problem Relation Age of Onset  . CAD Sister     Died with MI age 8  . Pneumonia Mother   . Arthritis Other   . Coronary artery disease Other   . Diabetes Other   . Cancer Other    History  Substance Use Topics  . Smoking status: Never Smoker   . Smokeless tobacco: Never Used  . Alcohol Use: No   OB History    No data available     Review of Systems  Constitutional: Negative for fever and chills.  Respiratory: Negative for cough, chest tightness and shortness of breath.   Cardiovascular: Negative for chest pain.  Gastrointestinal: Positive for nausea and vomiting. Negative for abdominal pain and diarrhea.  Genitourinary: Negative for dysuria.  Skin: Negative for wound.  Neurological: Negative for dizziness, weakness, numbness and headaches.  Psychiatric/Behavioral: Negative for confusion.  All other systems reviewed and are negative.     Allergies  Cephalexin  Home Medications   Prior to Admission medications   Medication Sig Start Date End Date Taking? Authorizing Provider  acetaminophen-codeine (TYLENOL #3) 300-30 MG per tablet Take one tablet by mouth half hour prior to therapy and every 4 hours as needed for pain 07/02/14   Lauree Chandler, NP  ALPRAZolam Duanne Moron) 0.25 MG tablet Take one tablet by mouth every 8 hours as needed for anxiety 07/21/14   Mahima  Bubba Camp, MD  ALPRAZolam Duanne Moron) 0.5 MG tablet Take one tablet by mouth every 8 hours as needed for anxiety 06/01/14   Nita Sells, MD  bisacodyl (DULCOLAX) 10 MG suppository Place 1 suppository (10 mg total) rectally daily as needed for moderate constipation. 06/01/14   Nita Sells, MD  insulin aspart (NOVOLOG) 100 UNIT/ML injection Inject 5 Units into the skin 3 (three) times daily with meals. 06/01/14   Nita Sells, MD  insulin glargine (LANTUS) 100 UNIT/ML injection Inject 10 Units into the skin at bedtime.    Historical Provider, MD  metoprolol (LOPRESSOR) 50 MG tablet Take 1 tablet (50 mg  total) by mouth 2 (two) times daily. 06/01/14   Nita Sells, MD  mirabegron ER (MYRBETRIQ) 25 MG TB24 tablet Take 1 tablet (25 mg total) by mouth daily. 08/07/13   Rexene Alberts, MD  potassium chloride (K-DUR) 10 MEQ tablet Take 10 mEq by mouth every other day.    Historical Provider, MD  pravastatin (PRAVACHOL) 10 MG tablet Take 10 mg by mouth every morning.     Historical Provider, MD  Rivaroxaban (XARELTO) 15 MG TABS tablet Take 1 tablet (15 mg total) by mouth daily before breakfast. 08/07/13   Rexene Alberts, MD  senna (SENOKOT) 8.6 MG TABS tablet Take 1 tablet (8.6 mg total) by mouth 2 (two) times daily. 06/01/14   Nita Sells, MD  sitaGLIPtin (JANUVIA) 50 MG tablet Take 50 mg by mouth every morning.    Historical Provider, MD  UNKNOWN TO PATIENT Take 0.5 tablets by mouth every evening. ANTIBIOTIC prescribed by Tarri Glenn (Dermatologist).    Historical Provider, MD   There were no vitals taken for this visit. Physical Exam  Constitutional: She is oriented to person, place, and time.  Elderly, ill-appearing  HENT:  Head: Normocephalic and atraumatic.  Eyes: Pupils are equal, round, and reactive to light.  Cardiovascular: Normal rate, regular rhythm and normal heart sounds.   No murmur heard. Pulmonary/Chest: Effort normal and breath sounds normal. No respiratory distress. She has no wheezes.  Anterior sternotomy scar  Abdominal: Soft. Bowel sounds are normal. There is no tenderness. There is no rebound.  Musculoskeletal: She exhibits no edema.  Neurological: She is alert and oriented to person, place, and time.  Skin: Skin is warm and dry. No rash noted.  Superficial skin tears bilateral lower extremities  Nursing note and vitals reviewed.   ED Course  Procedures (including critical care time) Labs Review Labs Reviewed  CBC WITH DIFFERENTIAL - Abnormal; Notable for the following:    WBC 13.7 (*)    RBC 3.62 (*)    Hemoglobin 10.9 (*)    HCT 32.9 (*)    Neutrophils  Relative % 81 (*)    Neutro Abs 11.1 (*)    All other components within normal limits  COMPREHENSIVE METABOLIC PANEL - Abnormal; Notable for the following:    Glucose, Bld 332 (*)    Albumin 3.2 (*)    AST 52 (*)    GFR calc non Af Amer 71 (*)    GFR calc Af Amer 82 (*)    All other components within normal limits  LACTIC ACID, PLASMA - Abnormal; Notable for the following:    Lactic Acid, Venous 3.9 (*)    All other components within normal limits  URINALYSIS, ROUTINE W REFLEX MICROSCOPIC - Abnormal; Notable for the following:    Glucose, UA >1000 (*)    Hgb urine dipstick TRACE (*)    All other components within normal limits  PRO B NATRIURETIC PEPTIDE - Abnormal; Notable for the following:    Pro B Natriuretic peptide (BNP) 3877.0 (*)    All other components within normal limits  TROPONIN I - Abnormal; Notable for the following:    Troponin I 2.83 (*)    All other components within normal limits  TROPONIN I - Abnormal; Notable for the following:    Troponin I 1.44 (*)    All other components within normal limits  TROPONIN I - Abnormal; Notable for the following:    Troponin I 1.06 (*)    All other components within normal limits  HEMOGLOBIN A1C - Abnormal; Notable for the following:    Hgb A1c MFr Bld 6.6 (*)    Mean Plasma Glucose 143 (*)    All other components within normal limits  PROTIME-INR - Abnormal; Notable for the following:    Prothrombin Time 15.8 (*)    All other components within normal limits  BASIC METABOLIC PANEL - Abnormal; Notable for the following:    Sodium 136 (*)    Glucose, Bld 185 (*)    GFR calc non Af Amer 56 (*)    GFR calc Af Amer 65 (*)    All other components within normal limits  CBC - Abnormal; Notable for the following:    RBC 3.22 (*)    Hemoglobin 9.5 (*)    HCT 28.7 (*)    All other components within normal limits  LIPID PANEL - Abnormal; Notable for the following:    HDL 38 (*)    All other components within normal limits   GLUCOSE, CAPILLARY - Abnormal; Notable for the following:    Glucose-Capillary 134 (*)    All other components within normal limits  GLUCOSE, CAPILLARY - Abnormal; Notable for the following:    Glucose-Capillary 204 (*)    All other components within normal limits  CBG MONITORING, ED - Abnormal; Notable for the following:    Glucose-Capillary 282 (*)    All other components within normal limits  CULTURE, BLOOD (ROUTINE X 2)  CULTURE, BLOOD (ROUTINE X 2)  URINE CULTURE  LIPASE, BLOOD  TROPONIN I  URINE MICROSCOPIC-ADD ON  GLUCOSE, CAPILLARY  TSH    Imaging Review Dg Chest Port 1v Same Day  10/01/2014   CLINICAL DATA:  Coronary disease post CABG, history stroke, type 2 diabetes, hypertension, dyspnea, hypoxia, pulmonary edema and small BILATERAL pleural effusions, followup  EXAM: PORTABLE CHEST - 1 VIEW SAME DAY  COMPARISON:  Portable exam 1635 hr compared to 10/01/2014 at 0741 hr  FINDINGS: Enlargement of cardiac silhouette post CABG.  Pulmonary vascular congestion.  Improved pulmonary edema.  Small bibasilar pleural effusions and atelectasis.  No segmental consolidation or pneumothorax.  Atherosclerotic calcification aorta.  Bones demineralized with posttraumatic deformity of the proximal RIGHT humerus.  IMPRESSION: Improved pulmonary edema/CHF.  Persistent bibasilar effusions atelectasis.   Electronically Signed   By: Lavonia Dana M.D.   On: 10/01/2014 16:51   Dg Abd Acute W/chest  10/01/2014   CLINICAL DATA:  Vomiting.  EXAM: ACUTE ABDOMEN SERIES (ABDOMEN 2 VIEW & CHEST 1 VIEW)  COMPARISON:  05/28/2014  FINDINGS: There is diffuse interstitial and airspace opacity which is fairly symmetric. Small bilateral pleural effusion. The patient is status post CABG. Mild cardiomegaly, stable from previous.  Nonobstructive bowel gas pattern. No evidence of pneumoperitoneum. The concerning intra-abdominal mass effect or calcification. Status post proximal left femur ORIF.  IMPRESSION: 1. Pulmonary  edema and small pleural effusions. 2. No evidence  of bowel obstruction.   Electronically Signed   By: Jorje Guild M.D.   On: 10/01/2014 07:59     EKG Interpretation   Date/Time:  Thursday October 01 2014 06:19:11 EST Ventricular Rate:  80 PR Interval:  206 QRS Duration: 142 QT Interval:  440 QTC Calculation: 507 R Axis:   -49 Text Interpretation:  Normal sinus rhythm Left axis deviation Non-specific  intra-ventricular conduction block Nonspecific T wave abnormality Abnormal  ECG ST depression and T-wave inversions laterally and more prominent than  when compared to prior Confirmed by HORTON  MD, Bland ([id number]) on  10/01/2014 6:22:14 AM      MDM   Final diagnoses:  Vomiting  Hyperglycemia  Acute pulmonary edema   Patient presents with emesis and hyperglycemia. Is ill-appearing but nontoxic on exam. Denies any abdominal or chest pain at this time. Initial EKG shows intraventricular conduction delay with ST depression and T-wave inversions laterally that are more prominent when compared to prior. Patient does have a history of coronary artery disease and isolated emesis in an elderly female could be a anginal equivalent.  Lab work obtained including troponin. Initial lab work notable for a lactic acidosis to 3.9, negative troponin, elevated BNP, and elevated glucose. Patient are to receive 1 L of normal saline by EMS. Satting 88% on room air. Will gently rehydrate this time. Acute abdominal series shows pulmonary edema. Given this, fluids discontinued. Patient given 20 mg IV Lasix.  Patient is afebrile but does have a mild leukocytosis. Will defer antibiotics at this time.  Will admit for further evaluation and management. Patient also given SQ insulin per admitting physician.    Merryl Hacker, MD 10/02/14 [phone]

## 2014-10-02 ENCOUNTER — Encounter (HOSPITAL_COMMUNITY): Payer: Self-pay | Admitting: Internal Medicine

## 2014-10-02 DIAGNOSIS — E119 Type 2 diabetes mellitus without complications: Secondary | ICD-10-CM

## 2014-10-02 DIAGNOSIS — I5043 Acute on chronic combined systolic (congestive) and diastolic (congestive) heart failure: Principal | ICD-10-CM

## 2014-10-02 DIAGNOSIS — D6489 Other specified anemias: Secondary | ICD-10-CM

## 2014-10-02 DIAGNOSIS — I059 Rheumatic mitral valve disease, unspecified: Secondary | ICD-10-CM

## 2014-10-02 LAB — URINE CULTURE
CULTURE: NO GROWTH
Colony Count: NO GROWTH

## 2014-10-02 LAB — GLUCOSE, CAPILLARY
GLUCOSE-CAPILLARY: 204 mg/dL — AB (ref 70–99)
GLUCOSE-CAPILLARY: 83 mg/dL (ref 70–99)
Glucose-Capillary: 164 mg/dL — ABNORMAL HIGH (ref 70–99)
Glucose-Capillary: 107 mg/dL — ABNORMAL HIGH (ref 70–99)
Glucose-Capillary: 149 mg/dL — ABNORMAL HIGH (ref 70–99)

## 2014-10-02 LAB — BASIC METABOLIC PANEL
ANION GAP: 12 (ref 5–15)
BUN: 18 mg/dL (ref 6–23)
CALCIUM: 8.4 mg/dL (ref 8.4–10.5)
CO2: 25 mEq/L (ref 19–32)
CREATININE: 0.88 mg/dL (ref 0.50–1.10)
Chloride: 99 mEq/L (ref 96–112)
GFR calc Af Amer: 65 mL/min — ABNORMAL LOW (ref >90)
GFR, EST NON AFRICAN AMERICAN: 56 mL/min — AB (ref >90)
Glucose, Bld: 185 mg/dL — ABNORMAL HIGH (ref 70–99)
Potassium: 3.8 mEq/L (ref 3.7–5.3)
Sodium: 136 mEq/L — ABNORMAL LOW (ref 137–147)

## 2014-10-02 LAB — LIPID PANEL
CHOLESTEROL: 112 mg/dL (ref 0–200)
HDL: 38 mg/dL — ABNORMAL LOW (ref >39)
LDL Cholesterol: 56 mg/dL (ref 0–99)
Total CHOL/HDL Ratio: 2.9 RATIO
Triglycerides: 88 mg/dL (ref <150)
VLDL: 18 mg/dL (ref 0–40)

## 2014-10-02 LAB — CBC
HCT: 28.7 % — ABNORMAL LOW (ref 36.0–46.0)
HEMOGLOBIN: 9.5 g/dL — AB (ref 12.0–15.0)
MCH: 29.5 pg (ref 26.0–34.0)
MCHC: 33.1 g/dL (ref 30.0–36.0)
MCV: 89.1 fL (ref 78.0–100.0)
PLATELETS: 189 10*3/uL (ref 150–400)
RBC: 3.22 MIL/uL — ABNORMAL LOW (ref 3.87–5.11)
RDW: 14.2 % (ref 11.5–15.5)
WBC: 7.4 10*3/uL (ref 4.0–10.5)

## 2014-10-02 LAB — TROPONIN I: TROPONIN I: 1.06 ng/mL — AB (ref <0.30)

## 2014-10-02 NOTE — Progress Notes (Signed)
Pt is continuously trying to get out of bed and leave the hospital. Administered Xanax to help her calm down and relax. Pt is becoming combative. Put mat down in front of bed as a fall precaution. Three side rails are up. Contacted MD. Waiting for response. Will continue to monitor.

## 2014-10-02 NOTE — Progress Notes (Signed)
Consulting cardiologist: Dr. Satira Sark  Seen for followup: Systolic heart failure, demand ischemia  Subjective:    Eating breakfast. No chest pain or shortness of breath at rest.  Objective:   Temp:  [97.8 F (36.6 C)-98 F (36.7 C)] 97.8 F (36.6 C) (11/13 0446) Pulse Rate:  [53-61] 61 (11/13 0446) Resp:  [18-20] 18 (11/13 0446) BP: (114-153)/(44-76) 135/67 mmHg (11/13 0446) SpO2:  [94 %-100 %] 100 % (11/13 0446) Weight:  [118 lb 2.7 oz (53.6 kg)-121 lb 11.1 oz (55.2 kg)] 118 lb 2.7 oz (53.6 kg) (11/13 0446) Last BM Date:  (Pt unable to state)  Filed Weights   10/01/14 0607 10/01/14 1111 10/02/14 0446  Weight: 130 lb (58.968 kg) 121 lb 11.1 oz (55.2 kg) 118 lb 2.7 oz (53.6 kg)    Intake/Output Summary (Last 24 hours) at 10/02/14 0902 Last data filed at 10/01/14 2013  Gross per 24 hour  Intake    480 ml  Output   2300 ml  Net  -1820 ml    Telemetry: Sinus rhythm with intermittent PVCs and PACs.  Exam:  General: Frail, elderly woman, no distress.  Lungs: Decreased breath sounds at the bases.  Cardiac: Regular rate and rhythm with ectopy, no S3, soft systolic murmur.  Extremities: No pitting edema.   Lab Results:  Basic Metabolic Panel:  Recent Labs Lab 10/01/14 0640 10/02/14 0034  NA 139 136*  K 4.4 3.8  CL 102 99  CO2 23 25  GLUCOSE 332* 185*  BUN 18 18  CREATININE 0.79 0.88  CALCIUM 8.5 8.4    Liver Function Tests:  Recent Labs Lab 10/01/14 0640  AST 52*  ALT 33  ALKPHOS 96  BILITOT 0.6  PROT 6.4  ALBUMIN 3.2*    CBC:  Recent Labs Lab 10/01/14 0640 10/02/14 0034  WBC 13.7* 7.4  HGB 10.9* 9.5*  HCT 32.9* 28.7*  MCV 90.9 89.1  PLT 211 189    Cardiac Enzymes:  Recent Labs Lab 10/01/14 1238 10/01/14 1835 10/02/14 0034  TROPONINI 2.83* 1.44* 1.06*    BNP:  Recent Labs  10/01/14 0801  PROBNP 3877.0*    Coagulation:  Recent Labs Lab 10/01/14 1238  INR 1.25     Medications:   Scheduled  Medications: . aspirin  81 mg Oral Daily  . famotidine  20 mg Oral Daily  . furosemide  20 mg Intravenous Q12H  . insulin aspart  0-15 Units Subcutaneous TID WC  . insulin aspart  0-5 Units Subcutaneous QHS  . insulin glargine  10 Units Subcutaneous QHS  . lisinopril  5 mg Oral Daily  . metoprolol  50 mg Oral BID  . potassium chloride  10 mEq Oral QODAY  . pravastatin  10 mg Oral q morning - 10a  . Rivaroxaban  15 mg Oral QAC breakfast  . senna  1 tablet Oral BID    PRN Medications: acetaminophen **OR** acetaminophen, ALPRAZolam, bisacodyl, guaiFENesin-dextromethorphan, levalbuterol, ondansetron **OR** ondansetron (ZOFRAN) IV, oxyCODONE   Assessment:   1. Demand ischemia, peak troponin 2.8 in the setting of acute on chronic systolic heart failure. No chest pain.  2. Acute on chronic systolic heart failure, 4315 cc out more than in. LVEF 45-50% as of September 2014.  3. History of multivessel CAD status post CABG.  4. Paroxysmal atrial fibrillation, on Xarelto and beta blocker, currently in sinus rhythm.  5. Suspected dementia.   Plan/Discussion:    Would stop aspirin at this point since she is on anticoagulant  to reduce overall bleeding risk. Continue Lasix, beta blocker, ACE inhibitor, and statin. Follow-up echocardiogram pending today. Anticipate conservative management overall.   Satira Sark, M.D., F.A.C.C.

## 2014-10-02 NOTE — Clinical Documentation Improvement (Signed)
  MD's, NP's, and PA's  Patient with O2 sat 88% on admit placed on 3 L [place], dx "hypoxia"  Respirations (23-30) in setting of Acute Systolic CHF/ NSTEMI / and Demand Ischemia . If either of the following diagnoses are appropriate please document in progress notes and discharge summary.  Thank you   Possible Clinical Conditions?  Acute Respiratory Failure  Acute Respiratory Insufficiency  Other Condition  Cannot Clinically Determine     Treatment: Oxygen via [place], Lasix IV   Thank You, Ree Kida ,RN Clinical Documentation Specialist:  [phone]  [place] [employer]

## 2014-10-02 NOTE — Progress Notes (Signed)
  Echocardiogram 2D Echocardiogram has been performed.  [place], [place] 10/02/2014, 11:53 AM

## 2014-10-02 NOTE — Care Management Note (Addendum)
    Page 1 of 2   10/05/2014     1:26:37 PM CARE MANAGEMENT NOTE 10/05/2014  Patient:  Alicia Mason, Alicia Mason   Account Number:  [id number]  Date Initiated:  10/02/2014  Documentation initiated by:  Jolene Provost  Subjective/Objective Assessment:   Pt is from home. Pt has 24/7 private duty aids, uses a walker and has no CM needs prior to admission.     Action/Plan:   No CM needs at this time.   Anticipated DC Date:  10/04/2014   Anticipated DC Plan:  [place]  CM consult      PAC Choice  DURABLE MEDICAL EQUIPMENT   Choice offered to / List presented to:  C-1 Patient   DME arranged  [facility]      DME agency  [place].        Status of service:  Completed, signed off Medicare Important Message given?  YES (If response is "NO", the following Medicare IM given date fields will be blank) Date Medicare IM given:  10/02/2014 Medicare IM given by:  Jolene Provost Date Additional Medicare IM given:  10/05/2014 Additional Medicare IM given by:  Theophilus Kinds  Discharge Disposition:  HOME/SELF CARE  Per UR Regulation:    If discussed at Long Length of Stay Meetings, dates discussed:    Comments:  10/05/14 [place], RN BSN CM Pt to be discharged home today. No HH needs at this time. Pt would like transport w/c and it was ordered from [facility] (per pts choice). It will be delivered to pts home. Pts granddaughters working on around the clock sitters and pts current caregiver will be staying with pt. No other CM needs noted. Pt and pts nurse aware of discharge arrangements.  10/05/14 [place], RN BSN CM CM spoke to pts caregiver, vera, about discharge planning. Vanita Ingles stated that she is with pt 8 hours a day and pts granddaughter, who is her HPOA, is working on arranging 24 hour care. Cm did express concerns that pt should not be left alone and caregiver did assure CM that pt would not be left alone.  Caregiver did volunteer that pt had just completed [place] [facility] with [facility] last week. Will probably need to resume [place] services at discharge. Caregiver did also request transport w/c to help with MD appts. Will continue to follow.  10/02/2014 0930 Jolene Provost, RN, MSN, [facility]

## 2014-10-02 NOTE — Evaluation (Signed)
Physical Therapy Evaluation Patient Details Name: Alicia Mason MRN: [id number] DOB: April 30, 1922 Today's Date: 10/02/2014   History of Present Illness  Alicia Mason is a 80 y.o. female  with a history of ischemic cardiomyopathy with an ejection fraction of 45-50% per echo in 06/2011, ischemic right brain stroke in 07/2013, type 2 diabetes mellitus, and osteoarthritis, who presented to the emergency department this morning with a complaint of feeling anxious and nervous.   Clinical Impression  Pt presents with supervision level with mobility. Pt has a home health aide who assists her at home. Pt reports she wants to return home and feels she can care for herself. Pt did demonstrate some decreased safety with transfers requiring verbal cues. I educated the pt and her aide on the safest way to transfer. Pt demonstrated some decreased memory. Pt has assistance from 9-5pm every day. Pt is currently at her baseline level of functioning per her aide and do not feel she needs any further therapy. I would recommend increasing her level of supervision to her bedtime to reduce the fall risk. Pt is d/c from acute PT services.    Follow Up Recommendations No PT follow up    Equipment Recommendations  None recommended by PT    Recommendations for Other Services       Precautions / Restrictions Precautions Precautions: Fall Restrictions Weight Bearing Restrictions: No      Mobility  Bed Mobility Overal bed mobility: Modified Independent                Transfers Overall transfer level: Needs assistance Equipment used: Rolling walker (2 wheeled) Transfers: Sit to/from Stand Sit to Stand: Supervision         General transfer comment: cues for hand placement for improved safety and independence. Reinforced with pt's home health aide.  Ambulation/Gait Ambulation/Gait assistance: Supervision Ambulation Distance (Feet): 125 Feet Assistive device: Rolling walker (2 wheeled) Gait  Pattern/deviations: Step-through pattern;Decreased stride length Gait velocity: decreased Gait velocity interpretation: Below normal speed for age/gender    Stairs            Wheelchair Mobility    Modified Rankin (Stroke Patients Only)       Balance Overall balance assessment: Needs assistance   Sitting balance-Leahy Scale: Good       Standing balance-Leahy Scale: Fair                               Pertinent Vitals/Pain Pain Assessment: No/denies pain    Home Living Family/patient expects to be discharged to:: Private residence Living Arrangements: Alone Available Help at Discharge: Personal care attendant Type of Home: House Home Access: Stairs to enter Entrance Stairs-Rails: Psychiatric nurse of Steps: 2-3 Home Layout: One level Home Equipment: Environmental consultant - 2 wheels Additional Comments: aide 9-5pm    Prior Function Level of Independence: Needs assistance               Hand Dominance        Extremity/Trunk Assessment   Upper Extremity Assessment: Defer to OT evaluation           Lower Extremity Assessment: Overall WFL for tasks assessed         Communication   Communication: HOH  Cognition Arousal/Alertness: Awake/alert Behavior During Therapy: WFL for tasks assessed/performed Overall Cognitive Status: Within Functional Limits for tasks assessed       Memory: Decreased short-term memory  General Comments      Exercises        Assessment/Plan    PT Assessment [place] does not need any further PT services  PT Diagnosis Difficulty walking   PT Problem List    PT Treatment Interventions     PT Goals (Current goals can be found in the Care Plan section)      Frequency     Barriers to discharge        Co-evaluation               End of Session Equipment Utilized During Treatment: Gait belt Activity Tolerance: Patient tolerated treatment well Patient left: in  chair;with call bell/phone within reach;with family/visitor present Nurse Communication: Mobility status         Time: 8786-7672 PT Time Calculation (min) (ACUTE ONLY): 27 min   Charges:   PT Evaluation $Initial PT Evaluation Tier I: 1 Procedure PT Treatments $Gait Training: 8-22 mins   PT G Codes:          Alicia Mason 10/02/2014, 10:05 AM

## 2014-10-02 NOTE — Progress Notes (Signed)
[facility] PROGRESS NOTE  Alicia Mason XTK:[id number] DOB: 14-Jul-1922 DOA: 10/01/2014 PCP: Glo Herring., MD    Code Status: full code Family Communication:discussed with caretaker, Ms. Bergman Disposition Plan: discharge to home when clinically appropriate.   Consultants:  cardiology  Procedures:  2-D echocardiogram 10/02/14: - Left ventricle: The cavity size was normal. There was moderate focal basal hypertrophy of the septum. Systolic function was mildly to moderately reduced. The estimated ejection fraction was in the range of 40% to 45%. There is severe hypokinesis of the basal-midinferolateral and inferior myocardium. Features are consistent with a pseudonormal left ventricular filling pattern, with concomitant abnormal relaxation and increased filling pressure (grade 2 diastolic dysfunction). - Aortic valve: Moderately calcified annulus. Trileaflet. There was mild regurgitation. - Mitral valve: Calcified annulus. Mildly thickened leaflets .Posterior leaflet is restricted. There was moderate to severe regurgitation directed posteriorly. MV to AV VTI ratio 1.2. - Left atrium: The atrium was moderately dilated. - Right ventricle: Systolic function was mildly reduced. - Right atrium: Central venous pressure (est): 3 mm Hg. - Atrial septum: No defect or patent foramen ovale was identified. - Tricuspid valve: There was mild regurgitation. - Pulmonary arteries: PA peak pressure: 47 mm Hg (S). - Pericardium, extracardiac: A trivial pericardial effusion was identified. There was a left pleural effusion. Impressions: - Moderate basal septal hypertrophy with LVEF 40-45%, inferior and inferolateral wall motion abnormalities consistent with ischemic cardiomyopathy. Grade 2 diastolic dysfunction. Moderate left atrial enlargement. MAC with thickened mitral leaflets, restricted posterior leaflet motion, and moderate to severe mitral  regurgitation as outlined above. Sclerotic aortic valve with mild aortic regurgitation. Mild tricuspid regurgitation with PASP 47 mm mercury. Trivial pericardial effusion. Left pleural effusion.  Antibiotics:  none  HPI/Subjective: The patient is sitting up in the chair, without any complaints of chest pain, shortness of breath, nausea, or vomiting. Her caretaker, Mrs. Rosana Hoes is in the room. Questions answered. Nursing reports that the patient became confused last night, consistent with sundowning.  Objective: Filed Vitals:   10/02/14 1542  BP: 134/58  Pulse: 61  Temp: 98.5 F (36.9 C)  Resp: 18    Intake/Output Summary (Last 24 hours) at 10/02/14 1555 Last data filed at 10/02/14 1347  Gross per 24 hour  Intake    420 ml  Output   2200 ml  Net  -1780 ml   Filed Weights   10/01/14 0607 10/01/14 1111 10/02/14 0446  Weight: 58.968 kg (130 lb) 55.2 kg (121 lb 11.1 oz) 53.6 kg (118 lb 2.7 oz)    Exam:   General:  Elderly 80 year old woman sitting up in a chair, in no acute distress.  Cardiovascular: S1, S2, with occasional ectopy; 2/6 systolic murmur  Respiratory:occasional crackles in the bases; breathing unlabored.  Abdomen: positive bowel sounds, soft, nontender, nondistended.  Musculoskeletal: trace of pedal edema bilaterally.  Neurologic: She is alert and oriented to herself and Ms. Davis. She is not quite sure, but she believes that she may be in a "doctor's office". Cranial nerves II through XII are grossly intact. She follows directions. Her speech is clear.  Data Reviewed: Basic Metabolic Panel:  Recent Labs Lab 10/01/14 0640 10/02/14 0034  NA 139 136*  K 4.4 3.8  CL 102 99  CO2 23 25  GLUCOSE 332* 185*  BUN 18 18  CREATININE 0.79 0.88  CALCIUM 8.5 8.4   Liver Function Tests:  Recent Labs Lab 10/01/14 0640  AST 52*  ALT 33  ALKPHOS 96  BILITOT 0.6  PROT 6.4  ALBUMIN 3.2*    Recent Labs Lab 10/01/14 0640  LIPASE 44   No  results for input(s): AMMONIA in the last 168 hours. CBC:  Recent Labs Lab 10/01/14 0640 10/02/14 0034  WBC 13.7* 7.4  NEUTROABS 11.1*  --   HGB 10.9* 9.5*  HCT 32.9* 28.7*  MCV 90.9 89.1  PLT 211 189   Cardiac Enzymes:  Recent Labs Lab 10/01/14 0640 10/01/14 1238 10/01/14 1835 10/02/14 0034  TROPONINI <0.30 2.83* 1.44* 1.06*   BNP (last 3 results)  Recent Labs  10/01/14 0801  PROBNP 3877.0*   CBG:  Recent Labs Lab 10/01/14 1150 10/01/14 1655 10/02/14 0016 10/02/14 0738 10/02/14 1109  GLUCAP 88 134* 204* 83 164*    Recent Results (from the past 240 hour(s))  Urine culture     Status: None   Collection Time: 10/01/14  7:10 AM  Result Value Ref Range Status   Specimen Description URINE, CATHETERIZED  Final   Special Requests NONE  Final   Culture  Setup Time   Final    10/01/2014 14:29 Performed at [place] Performed at [facility]   Final   Culture NO GROWTH Performed at [facility]   Final   Report Status 10/02/2014 FINAL  Final  Culture, blood (routine x 2)     Status: None (Preliminary result)   Collection Time: 10/01/14  7:31 AM  Result Value Ref Range Status   Specimen Description Blood  Final   Special Requests NONE  Final   Culture NO GROWTH <24 HRS  Final   Report Status PENDING  Incomplete  Culture, blood (routine x 2)     Status: None (Preliminary result)   Collection Time: 10/01/14  7:32 AM  Result Value Ref Range Status   Specimen Description Blood  Final   Special Requests NONE  Final   Culture NO GROWTH <24 HRS  Final   Report Status PENDING  Incomplete     Studies: Dg Chest Port 1v Same Day  10/01/2014   CLINICAL DATA:  Coronary disease post CABG, history stroke, type 2 diabetes, hypertension, dyspnea, hypoxia, pulmonary edema and small BILATERAL pleural effusions, followup  EXAM: PORTABLE CHEST - 1 VIEW SAME DAY  COMPARISON:  Portable exam 1635 hr compared to  10/01/2014 at 0741 hr  FINDINGS: Enlargement of cardiac silhouette post CABG.  Pulmonary vascular congestion.  Improved pulmonary edema.  Small bibasilar pleural effusions and atelectasis.  No segmental consolidation or pneumothorax.  Atherosclerotic calcification aorta.  Bones demineralized with posttraumatic deformity of the proximal RIGHT humerus.  IMPRESSION: Improved pulmonary edema/CHF.  Persistent bibasilar effusions atelectasis.   Electronically Signed   By: Lavonia Dana M.D.   On: 10/01/2014 16:51   Dg Abd Acute W/chest  10/01/2014   CLINICAL DATA:  Vomiting.  EXAM: ACUTE ABDOMEN SERIES (ABDOMEN 2 VIEW & CHEST 1 VIEW)  COMPARISON:  05/28/2014  FINDINGS: There is diffuse interstitial and airspace opacity which is fairly symmetric. Small bilateral pleural effusion. The patient is status post CABG. Mild cardiomegaly, stable from previous.  Nonobstructive bowel gas pattern. No evidence of pneumoperitoneum. The concerning intra-abdominal mass effect or calcification. Status post proximal left femur ORIF.  IMPRESSION: 1. Pulmonary edema and small pleural effusions. 2. No evidence of bowel obstruction.   Electronically Signed   By: Jorje Guild M.D.   On: 10/01/2014 07:59    Scheduled Meds: . aspirin  81 mg Oral Daily  . famotidine  20  mg Oral Daily  . furosemide  20 mg Intravenous Q12H  . insulin aspart  0-15 Units Subcutaneous TID WC  . insulin aspart  0-5 Units Subcutaneous QHS  . insulin glargine  10 Units Subcutaneous QHS  . lisinopril  5 mg Oral Daily  . metoprolol  50 mg Oral BID  . potassium chloride  10 mEq Oral QODAY  . pravastatin  10 mg Oral q morning - 10a  . Rivaroxaban  15 mg Oral QAC breakfast  . senna  1 tablet Oral BID   Continuous Infusions:   Assessment and plan:  Principal Problem:   Acute on chronic combined systolic and diastolic heart failure Active Problems:   NSTEMI (non-ST elevated myocardial infarction)   Ischemic cardiomyopathy   Osteoarthritis   DM  type 2 (diabetes mellitus, type 2)   Paroxysmal atrial fibrillation   Essential hypertension   History of arterial ischemic stroke   Chronic anxiety   Anemia due to other cause   1. Acute on chronic combined systolic and diastolic heart failure. 2-D echocardiogram on 11/13 revealed an ejection fraction of 40-45% and grade 2 diastolic dysfunction.there is also moderate to severe mitral regurgitation. The patient is not a candidate for valve replacement given her age and likely dementia. TSH was within normal limits. She is diuresing well. Will continue beta blocker, ACE inhibitor, and Lasix.  Non-ST elevation myocardial infarction secondary to demand ischemia. Her troponin I increased to 2.83, but trended down to 1.06.Cardiology has seen the patient and recommends conservative management. Aspirin was discontinued per cardiology because of her increased risk of bleeding on anticoagulation. We'll continue Xarelto, statin, beta blocker, and supportive treatment.  Paroxysmal atrial fibrillation. Rate currently controlled on beta blocker. Continue Xarelto for anticoagulation.  Type 2 diabetes mellitus. CBGs currently reasonable. Her hemoglobin A1c was 6.6. Continue sliding scale NovoLog.  Confusion overnight, consistent with sundowning. The patient may have mild underlying dementia. Her caretaker, Ms. Rosana Hoes agrees, but the patient is able to function with assistance at home.  Anemia, likely of chronic disease. We'll treat conservatively. Pepcid added empirically.  Time spent: 35 minutes.    [facility] Pager [phone]. If 7PM-7AM, please contact night-coverage at [email or web address], password [facility] 10/02/2014, 3:55 PM  LOS: 1 day

## 2014-10-03 LAB — BASIC METABOLIC PANEL
Anion gap: 14 (ref 5–15)
BUN: 17 mg/dL (ref 6–23)
CO2: 31 meq/L (ref 19–32)
Calcium: 9.2 mg/dL (ref 8.4–10.5)
Chloride: 95 mEq/L — ABNORMAL LOW (ref 96–112)
Creatinine, Ser: 0.79 mg/dL (ref 0.50–1.10)
GFR calc Af Amer: 82 mL/min — ABNORMAL LOW (ref >90)
GFR calc non Af Amer: 71 mL/min — ABNORMAL LOW (ref >90)
GLUCOSE: 160 mg/dL — AB (ref 70–99)
POTASSIUM: 3.5 meq/L — AB (ref 3.7–5.3)
SODIUM: 140 meq/L (ref 137–147)

## 2014-10-03 LAB — GLUCOSE, CAPILLARY
GLUCOSE-CAPILLARY: 147 mg/dL — AB (ref 70–99)
GLUCOSE-CAPILLARY: 127 mg/dL — AB (ref 70–99)
GLUCOSE-CAPILLARY: 240 mg/dL — AB (ref 70–99)
GLUCOSE-CAPILLARY: 246 mg/dL — AB (ref 70–99)

## 2014-10-03 MED ORDER — POTASSIUM CHLORIDE CRYS ER 20 MEQ PO TBCR
20.0000 meq | EXTENDED_RELEASE_TABLET | Freq: Two times a day (BID) | ORAL | Status: DC
  Administered 2014-10-03 – 2014-10-05 (×4): 20 meq via ORAL
  Filled 2014-10-03 (×5): qty 1

## 2014-10-03 NOTE — Progress Notes (Signed)
As [facility] PROGRESS NOTE  Alicia Mason TAV:[id number] DOB: December 11, 1921 DOA: 10/01/2014 PCP: Glo Herring., MD    Code Status: changed to DO NOT RESUSCITATE per POA/granddaughter Ms. Dye Family Communication:discussed with granddaughter via phone ([name]) and caretaker, Ms. Brookhaven Disposition Plan: discharge to home when clinically appropriate.   Consultants:  cardiology  Procedures:  2-D echocardiogram 10/02/14: - Left ventricle: The cavity size was normal. There was moderate focal basal hypertrophy of the septum. Systolic function was mildly to moderately reduced. The estimated ejection fraction was in the range of 40% to 45%. There is severe hypokinesis of the basal-midinferolateral and inferior myocardium. Features are consistent with a pseudonormal left ventricular filling pattern, with concomitant abnormal relaxation and increased filling pressure (grade 2 diastolic dysfunction). - Aortic valve: Moderately calcified annulus. Trileaflet. There was mild regurgitation. - Mitral valve: Calcified annulus. Mildly thickened leaflets .Posterior leaflet is restricted. There was moderate to severe regurgitation directed posteriorly. MV to AV VTI ratio 1.2. - Left atrium: The atrium was moderately dilated. - Right ventricle: Systolic function was mildly reduced. - Right atrium: Central venous pressure (est): 3 mm Hg. - Atrial septum: No defect or patent foramen ovale was identified. - Tricuspid valve: There was mild regurgitation. - Pulmonary arteries: PA peak pressure: 47 mm Hg (S). - Pericardium, extracardiac: A trivial pericardial effusion was identified. There was a left pleural effusion. Impressions: - Moderate basal septal hypertrophy with LVEF 40-45%, inferior and inferolateral wall motion abnormalities consistent with ischemic cardiomyopathy. Grade 2 diastolic dysfunction. Moderate left atrial enlargement. MAC with thickened  mitral leaflets, restricted posterior leaflet motion, and moderate to severe mitral regurgitation as outlined above. Sclerotic aortic valve with mild aortic regurgitation. Mild tricuspid regurgitation with PASP 47 mm mercury. Trivial pericardial effusion. Left pleural effusion.  Antibiotics:  none  HPI/Subjective: The patient is sitting up in the chair, without any complaints of chest pain, shortness of breath, nausea, or vomiting. Her caretaker, Mrs. Rosana Hoes is in the room. Questions answered. Nursing reports that the patient persistently tries to get out of bed at night during her confusion. She is able to be redirected. But a sitter has been ordered.  Objective: Filed Vitals:   10/03/14 1111  BP:   Pulse: 69  Temp:   Resp:   temperature 90.8. Heart rate 6. Respiratory rate 18. Blood pressure 129/53. Oxygen saturation on room air 96%.  Intake/Output Summary (Last 24 hours) at 10/03/14 1228 Last data filed at 10/03/14 0900  Gross per 24 hour  Intake    360 ml  Output   3800 ml  Net  -3440 ml   Filed Weights   10/01/14 1111 10/02/14 0446 10/03/14 0700  Weight: 55.2 kg (121 lb 11.1 oz) 53.6 kg (118 lb 2.7 oz) 50.5 kg (111 lb 5.3 oz)    Exam:   General:  Elderly 80 year old woman sitting up in a chair, in no acute distress.  Cardiovascular: S1, S2, with occasional ectopy; 2/6 systolic murmur  Respiratory:occasional crackles in the bases; breathing unlabored.  Abdomen: positive bowel sounds, soft, nontender, nondistended.  Musculoskeletal: resolution ofpedal edema bilaterally.  Neurologic: She is alert and oriented to herself and Ms. Davis. She is not oriented to the hospital. She does not remember seeing me yesterday but says "her face looks familiar". She is pleasant and her speech is clear, but she is intermittently confused.  Data Reviewed: Basic Metabolic Panel:  Recent Labs Lab 10/01/14 0640 10/02/14 0034 10/03/14 0816  NA 139 136* 140  K 4.4 3.8  3.5*  CL 102 99 95*  CO2 23 25 31   GLUCOSE 332* 185* 160*  BUN 18 18 17   CREATININE 0.79 0.88 0.79  CALCIUM 8.5 8.4 9.2   Liver Function Tests:  Recent Labs Lab 10/01/14 0640  AST 52*  ALT 33  ALKPHOS 96  BILITOT 0.6  PROT 6.4  ALBUMIN 3.2*    Recent Labs Lab 10/01/14 0640  LIPASE 44   No results for input(s): AMMONIA in the last 168 hours. CBC:  Recent Labs Lab 10/01/14 0640 10/02/14 0034  WBC 13.7* 7.4  NEUTROABS 11.1*  --   HGB 10.9* 9.5*  HCT 32.9* 28.7*  MCV 90.9 89.1  PLT 211 189   Cardiac Enzymes:  Recent Labs Lab 10/01/14 0640 10/01/14 1238 10/01/14 1835 10/02/14 0034  TROPONINI <0.30 2.83* 1.44* 1.06*   BNP (last 3 results)  Recent Labs  10/01/14 0801  PROBNP 3877.0*   CBG:  Recent Labs Lab 10/02/14 1109 10/02/14 1622 10/02/14 2024 10/03/14 0745 10/03/14 1129  GLUCAP 164* 107* 149* 147* 127*    Recent Results (from the past 240 hour(s))  Urine culture     Status: None   Collection Time: 10/01/14  7:10 AM  Result Value Ref Range Status   Specimen Description URINE, CATHETERIZED  Final   Special Requests NONE  Final   Culture  Setup Time   Final    10/01/2014 14:29 Performed at [place] Performed at [facility]   Final   Culture NO GROWTH Performed at [facility]   Final   Report Status 10/02/2014 FINAL  Final  Culture, blood (routine x 2)     Status: None (Preliminary result)   Collection Time: 10/01/14  7:31 AM  Result Value Ref Range Status   Specimen Description BLOOD RIGHT ARM  Final   Special Requests BOTTLES DRAWN AEROBIC ONLY 10CC  Final   Culture NO GROWTH 2 DAYS  Final   Report Status PENDING  Incomplete  Culture, blood (routine x 2)     Status: None (Preliminary result)   Collection Time: 10/01/14  7:32 AM  Result Value Ref Range Status   Specimen Description BLOOD LEFT ARM  Final   Special Requests BOTTLES DRAWN AEROBIC AND ANAEROBIC 10CC  Final    Culture NO GROWTH 2 DAYS  Final   Report Status PENDING  Incomplete     Studies: Dg Chest Port 1v Same Day  10/01/2014   CLINICAL DATA:  Coronary disease post CABG, history stroke, type 2 diabetes, hypertension, dyspnea, hypoxia, pulmonary edema and small BILATERAL pleural effusions, followup  EXAM: PORTABLE CHEST - 1 VIEW SAME DAY  COMPARISON:  Portable exam 1635 hr compared to 10/01/2014 at 0741 hr  FINDINGS: Enlargement of cardiac silhouette post CABG.  Pulmonary vascular congestion.  Improved pulmonary edema.  Small bibasilar pleural effusions and atelectasis.  No segmental consolidation or pneumothorax.  Atherosclerotic calcification aorta.  Bones demineralized with posttraumatic deformity of the proximal RIGHT humerus.  IMPRESSION: Improved pulmonary edema/CHF.  Persistent bibasilar effusions atelectasis.   Electronically Signed   By: Lavonia Dana M.D.   On: 10/01/2014 16:51    Scheduled Meds: . aspirin  81 mg Oral Daily  . famotidine  20 mg Oral Daily  . furosemide  20 mg Intravenous Q12H  . insulin aspart  0-15 Units Subcutaneous TID WC  . insulin aspart  0-5 Units Subcutaneous QHS  . insulin glargine  10 Units Subcutaneous QHS  .  lisinopril  5 mg Oral Daily  . metoprolol  50 mg Oral BID  . potassium chloride  10 mEq Oral QODAY  . pravastatin  10 mg Oral q morning - 10a  . Rivaroxaban  15 mg Oral QAC breakfast  . senna  1 tablet Oral BID   Continuous Infusions:   Assessment and plan:  Principal Problem:   Acute on chronic combined systolic and diastolic heart failure Active Problems:   NSTEMI (non-ST elevated myocardial infarction)   Ischemic cardiomyopathy   Osteoarthritis   DM type 2 (diabetes mellitus, type 2)   Paroxysmal atrial fibrillation   Essential hypertension   History of arterial ischemic stroke   Chronic anxiety   Anemia due to other cause   1. Acute on chronic combined systolic and diastolic heart failure. 2-D echocardiogram on 11/13 revealed an  ejection fraction of 40-45% and grade 2 diastolic dysfunction. There is also moderate to severe mitral regurgitation. The patient is not a candidate for valve replacement given her age and likely dementia. TSH was within normal limits. She is diuresing well. Will continue beta blocker, ACE inhibitor, and Lasix. - We'll transition Lasix to by mouth tomorrow and discontinue Foley catheter.  Non-ST elevation myocardial infarction secondary to demand ischemia. Her troponin I increased to 2.83, but trended down to 1.06.Cardiology evaluated the patient and recommended conservative management, which I agree. Aspirin was discontinued per cardiology because of her increased risk of bleeding on anticoagulation. We'll continue Xarelto, statin, beta blocker, and supportive treatment.  Paroxysmal atrial fibrillation. Rate currently controlled on beta blocker. Continue Xarelto for anticoagulation.  Type 2 diabetes mellitus. CBGs currently reasonable. Her hemoglobin A1c was 6.6. Continue sliding scale NovoLog.  Chronic dementia, probable Alzheimer's given persistent nocturnal confusion  consistent with sundowning. She is easily directed at night.  Anemia, likely of chronic disease. We'll treat conservatively. Pepcid added empirically.  Time spent: 25 minutes.    [facility] Pager [phone]. If 7PM-7AM, please contact night-coverage at [email or web address], password [facility] 10/03/2014, 12:28 PM  LOS: 2 days

## 2014-10-03 NOTE — Plan of Care (Signed)
Problem: Phase I Progression Outcomes Goal: Pain controlled with appropriate interventions Outcome: Completed/Met Date Met:  10/03/14 Goal: OOB as tolerated unless otherwise ordered Outcome: Progressing Goal: Initial discharge plan identified Outcome: Progressing Goal: Voiding-avoid urinary catheter unless indicated Outcome: Not Progressing Goal: Hemodynamically stable Outcome: Progressing Goal: Other Phase I Outcomes/Goals Outcome: Completed/Met Date Met:  10/03/14  Problem: Phase II Progression Outcomes Goal: Progress activity as tolerated unless otherwise ordered Outcome: Progressing Goal: Discharge plan established Outcome: Progressing Goal: Vital signs remain stable Outcome: Progressing

## 2014-10-04 DIAGNOSIS — I10 Essential (primary) hypertension: Secondary | ICD-10-CM

## 2014-10-04 LAB — BASIC METABOLIC PANEL
ANION GAP: 11 (ref 5–15)
BUN: 29 mg/dL — ABNORMAL HIGH (ref 6–23)
CO2: 31 mEq/L (ref 19–32)
CREATININE: 1.03 mg/dL (ref 0.50–1.10)
Calcium: 9.1 mg/dL (ref 8.4–10.5)
Chloride: 97 mEq/L (ref 96–112)
GFR calc Af Amer: 54 mL/min — ABNORMAL LOW (ref >90)
GFR, EST NON AFRICAN AMERICAN: 46 mL/min — AB (ref >90)
Glucose, Bld: 180 mg/dL — ABNORMAL HIGH (ref 70–99)
Potassium: 3.9 mEq/L (ref 3.7–5.3)
SODIUM: 139 meq/L (ref 137–147)

## 2014-10-04 MED ORDER — SODIUM CHLORIDE 0.9 % IV SOLN
INTRAVENOUS | Status: DC
  Administered 2014-10-04: 40 mL via INTRAVENOUS

## 2014-10-04 MED ORDER — FUROSEMIDE 20 MG PO TABS
20.0000 mg | ORAL_TABLET | Freq: Two times a day (BID) | ORAL | Status: DC
  Administered 2014-10-05: 20 mg via ORAL
  Filled 2014-10-04: qty 1

## 2014-10-04 NOTE — Progress Notes (Signed)
[facility] PRO[facility]  Alicia Mason MCN:[id number] DOB: 1922-07-31 DOA: 10/01/2014 PCP: Glo Herring., MD    Code Status: changed to DO NOT RESUSCITATE per POA/granddaughter Ms. Dye Family Communication:discussed with granddaughter via phone ([name]) and caretaker, Ms. Davis On 11/14. Disposition Plan: discharge to home when clinically appropriate; likely tomorrow.   Consultants:  cardiology  Procedures:  2-D echocardiogram 10/02/14: - Left ventricle: The cavity size was normal. There was moderate focal basal hypertrophy of the septum. Systolic function was mildly to moderately reduced. The estimated ejection fraction was in the range of 40% to 45%. There is severe hypokinesis of the basal-midinferolateral and inferior myocardium. Features are consistent with a pseudonormal left ventricular filling pattern, with concomitant abnormal relaxation and increased filling pressure (grade 2 diastolic dysfunction). - Aortic valve: Moderately calcified annulus. Trileaflet. There was mild regurgitation. - Mitral valve: Calcified annulus. Mildly thickened leaflets .Posterior leaflet is restricted. There was moderate to severe regurgitation directed posteriorly. MV to AV VTI ratio 1.2. - Left atrium: The atrium was moderately dilated. - Right ventricle: Systolic function was mildly reduced. - Right atrium: Central venous pressure (est): 3 mm Hg. - Atrial septum: No defect or patent foramen ovale was identified. - Tricuspid valve: There was mild regurgitation. - Pulmonary arteries: PA peak pressure: 47 mm Hg (S). - Pericardium, extracardiac: A trivial pericardial effusion was identified. There was a left pleural effusion. Impressions: - Moderate basal septal hypertrophy with LVEF 40-45%, inferior and inferolateral wall motion abnormalities consistent with ischemic cardiomyopathy. Grade 2 diastolic dysfunction. Moderate left atrial  enlargement. MAC with thickened mitral leaflets, restricted posterior leaflet motion, and moderate to severe mitral regurgitation as outlined above. Sclerotic aortic valve with mild aortic regurgitation. Mild tricuspid regurgitation with PASP 47 mm mercury. Trivial pericardial effusion. Left pleural effusion.  Antibiotics:  none  HPI/Subjective: The patient is sitting up in bed. She has no complaints. She continues to have some sundowning each night. Safety sitter ordered.  Objective: Filed Vitals:   10/04/14 1456  BP: 116/59  Pulse: 67  Temp: 98.2 F (36.8 C)  Resp: 18  oxygen saturation 96% on room air .  Intake/Output Summary (Last 24 hours) at 10/04/14 1554 Last data filed at 10/04/14 1456  Gross per 24 hour  Intake    600 ml  Output    302 ml  Net    298 ml   Filed Weights   10/02/14 0446 10/03/14 0700 10/04/14 0500  Weight: 53.6 kg (118 lb 2.7 oz) 50.5 kg (111 lb 5.3 oz) 51.45 kg (113 lb 6.8 oz)    Exam:   General:  Elderly 80 year old woman in no acute distress.  Cardiovascular: irregular, irregular; 2/6 systolic murmur  Respiratory:clear anteriorly with decreased breath sounds in the bases.  Abdomen: positive bowel sounds, soft, nontender, nondistended.  Musculoskeletal: resolution of pedal edema bilaterally.  Neurologic: She is alert and oriented to herself. She is not oriented to place time or year, but she is pleasantly alert and interactive.  Data Reviewed: Basic Metabolic Panel:  Recent Labs Lab 10/01/14 0640 10/02/14 0034 10/03/14 0816 10/04/14 0626  NA 139 136* 140 139  K 4.4 3.8 3.5* 3.9  CL 102 99 95* 97  CO2 23 25 31 31   GLUCOSE 332* 185* 160* 180*  BUN 18 18 17  29*  CREATININE 0.79 0.88 0.79 1.03  CALCIUM 8.5 8.4 9.2 9.1   Liver Function Tests:  Recent Labs Lab 10/01/14 0640  AST 52*  ALT 33  ALKPHOS 96  BILITOT  0.6  PROT 6.4  ALBUMIN 3.2*    Recent Labs Lab 10/01/14 0640  LIPASE 44   No results for  input(s): AMMONIA in the last 168 hours. CBC:  Recent Labs Lab 10/01/14 0640 10/02/14 0034  WBC 13.7* 7.4  NEUTROABS 11.1*  --   HGB 10.9* 9.5*  HCT 32.9* 28.7*  MCV 90.9 89.1  PLT 211 189   Cardiac Enzymes:  Recent Labs Lab 10/01/14 0640 10/01/14 1238 10/01/14 1835 10/02/14 0034  TROPONINI <0.30 2.83* 1.44* 1.06*   BNP (last 3 results)  Recent Labs  10/01/14 0801  PROBNP 3877.0*   CBG:  Recent Labs Lab 10/02/14 2024 10/03/14 0745 10/03/14 1129 10/03/14 1613 10/03/14 2031  GLUCAP 149* 147* 127* 240* 246*    Recent Results (from the past 240 hour(s))  Urine culture     Status: None   Collection Time: 10/01/14  7:10 AM  Result Value Ref Range Status   Specimen Description URINE, CATHETERIZED  Final   Special Requests NONE  Final   Culture  Setup Time   Final    10/01/2014 14:29 Performed at [facility] Performed at [facility]   Final   Culture NO GROWTH Performed at [facility]   Final   Report Status 10/02/2014 FINAL  Final  Culture, blood (routine x 2)     Status: None (Preliminary result)   Collection Time: 10/01/14  7:31 AM  Result Value Ref Range Status   Specimen Description BLOOD RIGHT ARM  Final   Special Requests BOTTLES DRAWN AEROBIC ONLY 10CC  Final   Culture NO GROWTH 3 DAYS  Final   Report Status PENDING  Incomplete  Culture, blood (routine x 2)     Status: None (Preliminary result)   Collection Time: 10/01/14  7:32 AM  Result Value Ref Range Status   Specimen Description BLOOD LEFT ARM  Final   Special Requests BOTTLES DRAWN AEROBIC AND ANAEROBIC 10CC  Final   Culture NO GROWTH 3 DAYS  Final   Report Status PENDING  Incomplete     Studies: No results found.  Scheduled Meds: . aspirin  81 mg Oral Daily  . famotidine  20 mg Oral Daily  . [START ON 10/05/2014] furosemide  20 mg Oral BID  . insulin aspart  0-15 Units Subcutaneous TID WC  . insulin aspart  0-5 Units  Subcutaneous QHS  . insulin glargine  10 Units Subcutaneous QHS  . lisinopril  5 mg Oral Daily  . metoprolol  50 mg Oral BID  . potassium chloride  20 mEq Oral BID  . pravastatin  10 mg Oral q morning - 10a  . Rivaroxaban  15 mg Oral QAC breakfast  . senna  1 tablet Oral BID   Continuous Infusions: . sodium chloride 40 mL (10/04/14 1213)    Assessment and plan:  Principal Problem:   Acute on chronic combined systolic and diastolic heart failure Active Problems:   NSTEMI (non-ST elevated myocardial infarction)   Ischemic cardiomyopathy   Osteoarthritis   DM type 2 (diabetes mellitus, type 2)   Paroxysmal atrial fibrillation   Essential hypertension   History of arterial ischemic stroke   Chronic anxiety   Anemia due to other cause   1. Acute on chronic combined systolic and diastolic heart failure. 2-D echocardiogram on 11/13 revealed an ejection fraction of 40-45% and grade 2 diastolic dysfunction. There is also moderate to severe mitral regurgitation. The patient is not  a candidate for valve replacement given her age and likely dementia. TSH was within normal limits. She is diuresing well. Will continue beta blocker, ACE inhibitor, and Lasix. She has developed azotemia. She was given 1 IV dose of Lasix this morning. We'll hold further Lasix today and transition to oral Lasix tomorrow morning. We'll give her gentle IV fluids for several hours. -Will discontinue Foley catheter.  Non-ST elevation myocardial infarction secondary to demand ischemia. Her troponin I increased to 2.83, but trended down to 1.06.Cardiology evaluated the patient and recommended conservative management, which I agree. Aspirin was discontinued per cardiology because of her increased risk of bleeding on anticoagulation. We'll continue Xarelto, statin, beta blocker, and supportive treatment.  Paroxysmal atrial fibrillation. Rate currently controlled on beta blocker. Continue Xarelto for  anticoagulation.  Type 2 diabetes mellitus. CBGs currently reasonable. Her hemoglobin A1c was 6.6. Continue sliding scale NovoLog and Lantus. Januvia is on hold.  Chronic dementia, probable Alzheimer's given persistent nocturnal confusion  consistent with sundowning. She is easily directed at night.  Anemia, likely of chronic disease. We'll treat conservatively. Pepcid added empirically.  Time spent: 25 minutes.    [facility] Pager [phone]. If 7PM-7AM, please contact night-coverage at [email or web address], password [facility] 10/04/2014, 3:54 PM  LOS: 3 days

## 2014-10-04 NOTE — Plan of Care (Signed)
Problem: Phase III Progression Outcomes Goal: Voiding independently Outcome: Not Progressing Patient has a foley

## 2014-10-05 ENCOUNTER — Encounter (HOSPITAL_COMMUNITY): Payer: Self-pay | Admitting: Internal Medicine

## 2014-10-05 LAB — BASIC METABOLIC PANEL
Anion gap: 8 (ref 5–15)
BUN: 24 mg/dL — ABNORMAL HIGH (ref 6–23)
CO2: 30 mEq/L (ref 19–32)
CREATININE: 0.89 mg/dL (ref 0.50–1.10)
Calcium: 8.9 mg/dL (ref 8.4–10.5)
Chloride: 99 mEq/L (ref 96–112)
GFR, EST AFRICAN AMERICAN: 64 mL/min — AB (ref >90)
GFR, EST NON AFRICAN AMERICAN: 55 mL/min — AB (ref >90)
Glucose, Bld: 185 mg/dL — ABNORMAL HIGH (ref 70–99)
POTASSIUM: 4.3 meq/L (ref 3.7–5.3)
Sodium: 137 mEq/L (ref 137–147)

## 2014-10-05 LAB — GLUCOSE, CAPILLARY: Glucose-Capillary: 167 mg/dL — ABNORMAL HIGH (ref 70–99)

## 2014-10-05 MED ORDER — FUROSEMIDE 20 MG PO TABS: 20.0000 mg | ORAL_TABLET | Freq: Two times a day (BID) | ORAL | Status: DC

## 2014-10-05 MED ORDER — POTASSIUM CHLORIDE ER 10 MEQ PO TBCR: 10.0000 meq | EXTENDED_RELEASE_TABLET | Freq: Two times a day (BID) | ORAL | Status: DC

## 2014-10-05 MED ORDER — FAMOTIDINE 20 MG PO TABS: 20.0000 mg | ORAL_TABLET | Freq: Every day | ORAL | Status: DC

## 2014-10-05 NOTE — Progress Notes (Signed)
IV removed. Discharge instructions reviewed with patient and caregiver Vera. Understanding verbalized. Ready for discharge home.

## 2014-10-05 NOTE — Plan of Care (Signed)
Problem: Phase I Progression Outcomes Goal: OOB as tolerated unless otherwise ordered Outcome: Progressing Goal: Initial discharge plan identified Outcome: Not Progressing Goal: Voiding-avoid urinary catheter unless indicated Outcome: Progressing

## 2014-10-05 NOTE — Discharge Summary (Signed)
Physician Discharge Summary  Alicia Mason JXB:[id number] DOB: Jan 09, 1922 DOA: 10/01/2014  PCP: Glo Herring., MD  Admit date: 10/01/2014 Discharge date: 10/05/2014  Time spent: greater than 30 minutes  Recommendations for Outpatient Follow-up:  1. Recommend rechecking the patient's renal function and potassium.  2.   Discharge Diagnoses:  1. Acute on chronic combined systolic and diastolic heart failure. --Per echo, ejection fraction 13-08%; grade 2 diastolic dysfunction. ---6 L during hospital course. -- weight at the time of discharge 112 pounds, decreased from 121 pounds. 2. Ischemic cardiomyopathy/CAD; status post CABG in 1999. 3. Non-ST elevation myocardial infarction during this hospitalization. Treated with medical management. 4. Paroxysmal atrial fibrillation, on chronic Xarelto. 5. Essential hypertension. 6. Type 2 diabetes mellitus. 7. Anemia, likely of chronic disease.  8. History of ischemic stroke. 9. Chronic anxiety. 10. Chronic dementia. 11. Mild acute respiratory failure with hypoxia, secondary to acute heart failure on admission. Resolved. 12. DO NOT RESUSCITATE status, per conversation with her POA and granddaughter, Ms. Alicia Mason.    Discharge Condition: Improved.  Diet recommendation: heart healthy/carbohydrate modified.  Filed Weights   10/03/14 0700 10/04/14 0500 10/05/14 0557  Weight: 50.5 kg (111 lb 5.3 oz) 51.45 kg (113 lb 6.8 oz) 50.9 kg (112 lb 3.4 oz)    History of present illness:   The patient is a 80 year old woman with a history of ischemic cardiomyopathy, ischemic right brain stroke in September 2014, type 2 diabetes mellitus, and dementia, who presented to the emergency department on 10/01/14 with a chief complaint of nervousness. In the ED, she was mildly hypertensive and mildly tachypneic. Her oxygen saturation was 88% on room air. She was afebrile.Her acute abdominal series revealed pulmonary edema and small pleural effusions, but no  evidence of bowel obstruction. Her lab data were significant for a venous glucose of 332, normal troponin I, proBNP of 3877, CBC of 13.7, lactic acid of 3.9, hemoglobin of 10.9. Her urinalysis revealed no nitrites or wbcs. Her lipase and liver transaminases were within normal limits. Her EKG revealed normal sinus rhythm, heart rate of 80 bpm, and intraventricular block. She was admitted for further evaluation and management.  Hospital Course:   1. Acute on chronic combined systolic and diastolic heart failure. The patient was started on Lasix at 20 mg IV every 12 hours. Her chronic medications were continued including beta blocker, statin, and Xarelto. For further evaluation, a number studies were ordered. 2-D echocardiogram on 11/13 revealed an ejection fraction of 40-45% and grade 2 diastolic dysfunction. There was also moderate to severe mitral regurgitation. Lisinopril was added. TSH was within normal limits. She diuresed well; a total of -6 L approximately. She lost approximately 9-10 pounds depending on whether or not her true weight on admission was 130 or 121. She developed mild azotemia, so IV Lasix was discontinued and she was transitioned to oral Lasix. At the time of discharge, she was without shortness of breath. She was oxygenating 97% on room air.  Non-ST elevation myocardial infarction secondary to demand ischemia. The patient has a history of CAD with CABG in 1999. For evaluation, her troponin I was cycle. The initial troponin I was negative in the ED, but the subsequent troponin I increased to 2.83, but trended down to 1.06.Cardiology evaluated the patient and recommended conservative management, which I agreedwith. Aspirin was discontinued per cardiology because of her increased risk of bleeding on anticoagulation. She was continued on Xarelto, statin, beta blocker, and supportive treatment. She had no complaint of chest pain during hospitalization.  Paroxysmal atrial  fibrillation. Her rate was controlled on metoprolol and Xarelto was continued for anticoagulation.  Type 2 diabetes mellitus. Her CBGs were reasonable during the hospital course. She was treated with sliding scale NovoLog and Lantus. Januvia was on hold, but her caretaker was instructed to restart it upon discharge.   Chronic dementia, probable Alzheimer's given persistent nocturnal confusion consistent with sundowning. She was easily directed at night. She has a caretaker who stays with her most of the day and night.  Anemia, likely of chronic disease.  Pepcid was added empirically. No further workup or treatment was warranted or indicated in this elderly patient.   Procedures:  2-D echocardiogram 10/02/14: - Left ventricle: The cavity size was normal. There was moderate focal basal hypertrophy of the septum. Systolic function was mildly to moderately reduced. The estimated ejection fraction was in the range of 40% to 45%. There is severe hypokinesis of the basal-midinferolateral and inferior myocardium. Features are consistent with a pseudonormal left ventricular filling pattern, with concomitant abnormal relaxation and increased filling pressure (grade 2 diastolic dysfunction). - Aortic valve: Moderately calcified annulus. Trileaflet. There was mild regurgitation. - Mitral valve: Calcified annulus. Mildly thickened leaflets .Posterior leaflet is restricted. There was moderate to severe regurgitation directed posteriorly. MV to AV VTI ratio 1.2. - Left atrium: The atrium was moderately dilated. - Right ventricle: Systolic function was mildly reduced. - Right atrium: Central venous pressure (est): 3 mm Hg. - Atrial septum: No defect or patent foramen ovale was identified. - Tricuspid valve: There was mild regurgitation. - Pulmonary arteries: PA peak pressure: 47 mm Hg (S). - Pericardium, extracardiac: A trivial pericardial effusion was identified. There was  a left pleural effusion. Impressions: - Moderate basal septal hypertrophy with LVEF 40-45%, inferior and inferolateral wall motion abnormalities consistent with ischemic cardiomyopathy. Grade 2 diastolic dysfunction. Moderate left atrial enlargement. MAC with thickened mitral leaflets, restricted posterior leaflet motion, and moderate to severe mitral regurgitation as outlined above. Sclerotic aortic valve with mild aortic regurgitation. Mild tricuspid regurgitation with PASP 47 mm mercury. Trivial pericardial effusion. Left pleural effusion.  Consultations:  Cardiologist, Dr. Domenic Polite  Discharge Exam: Filed Vitals:   10/05/14 0814  BP: 135/61  Pulse: 75  Temp:   Resp:     General: pleasant, elderly, pleasantly demented 80 year old sitting up in bed, in no acute distress. Cardiovascular: S1, S2, with a 2/6 systolic murmur. Respiratory: decreased breath sounds in the bases, but clear anteriorly. Breathing is nonlabored. Extremities: No pedal edema.  Discharge Instructions You were cared for by a hospitalist during your hospital stay. If you have any questions about your discharge medications or the care you received while you were in the hospital after you are discharged, you can call the unit and asked to speak with the hospitalist on call if the hospitalist that took care of you is not available. Once you are discharged, your primary care physician will handle any further medical issues. Please note that NO REFILLS for any discharge medications will be authorized once you are discharged, as it is imperative that you return to your primary care physician (or establish a relationship with a primary care physician if you do not have one) for your aftercare needs so that they can reassess your need for medications and monitor your lab values.  Discharge Instructions    Diet - low sodium heart healthy    Complete by:  As directed      Diet Carb Modified    Complete by:   As  directed      Increase activity slowly    Complete by:  As directed           Current Discharge Medication List    START taking these medications   Details  famotidine (PEPCID) 20 MG tablet Take 1 tablet (20 mg total) by mouth daily. Can buy over-the-counter    furosemide (LASIX) 20 MG tablet Take 1 tablet (20 mg total) by mouth 2 (two) times daily. For treatment of heart failure. Qty: 60 tablet, Refills: 3      CONTINUE these medications which have CHANGED   Details  potassium chloride (K-DUR) 10 MEQ tablet Take 1 tablet (10 mEq total) by mouth 2 (two) times daily. Potassium. Take each tablet with furosemide twice daily. Qty: 60 tablet, Refills: 3      CONTINUE these medications which have NOT CHANGED   Details  acetaminophen-codeine (TYLENOL #3) 300-30 MG per tablet Take one tablet by mouth half hour prior to therapy and every 4 hours as needed for pain Qty: 180 tablet, Refills: 0    ALPRAZolam (XANAX) 0.5 MG tablet Take one tablet by mouth every 8 hours as needed for anxiety Qty: 90 tablet, Refills: 0    bisacodyl (DULCOLAX) 10 MG suppository Place 1 suppository (10 mg total) rectally daily as needed for moderate constipation. Qty: 12 suppository, Refills: 0    insulin aspart (NOVOLOG) 100 UNIT/ML injection Inject 5 Units into the skin 3 (three) times daily with meals. Qty: 10 mL, Refills: 11    insulin glargine (LANTUS) 100 UNIT/ML injection Inject 10 Units into the skin at bedtime.    metoprolol (LOPRESSOR) 50 MG tablet Take 1 tablet (50 mg total) by mouth 2 (two) times daily. Qty: 60 tablet, Refills: 0    pravastatin (PRAVACHOL) 10 MG tablet Take 10 mg by mouth every morning.     Rivaroxaban (XARELTO) 15 MG TABS tablet Take 1 tablet (15 mg total) by mouth daily before breakfast. Qty: 30 tablet    senna (SENOKOT) 8.6 MG TABS tablet Take 1 tablet (8.6 mg total) by mouth 2 (two) times daily. Qty: 120 each, Refills: 0    sitaGLIPtin (JANUVIA) 50 MG tablet Take  50 mg by mouth every morning.    UNKNOWN TO PATIENT Take 0.5 tablets by mouth every evening. ANTIBIOTIC prescribed by Tarri Glenn (Dermatologist).      STOP taking these medications     mirabegron ER (MYRBETRIQ) 25 MG TB24 tablet        Allergies  Allergen Reactions  . Cephalexin     NOT SURE OF THE REACTION, BUT WAS TOLD NOT TO TAKE IT. Granddaughter mentioned that patient had hives, rash. Denies anaphylactic reaction.   Follow-up Information    Follow up with Jory Sims, NP On 10/19/2014.   Specialty:  Nurse Practitioner   Why:  at 2:10 pm   Contact information:   [place] [id number] [phone]       Follow up with Glo Herring., MD. Schedule an appointment as soon as possible for a visit in 2 weeks.   Specialty:  Internal Medicine   Contact information:   [address] [place] [address] [phone]        The results of significant diagnostics from this hospitalization (including imaging, microbiology, ancillary and laboratory) are listed below for reference.    Significant Diagnostic Studies: Dg Chest Port 1v Same Day  10/01/2014   CLINICAL DATA:  Coronary disease post CABG, history stroke, type 2 diabetes, hypertension,  dyspnea, hypoxia, pulmonary edema and small BILATERAL pleural effusions, followup  EXAM: PORTABLE CHEST - 1 VIEW SAME DAY  COMPARISON:  Portable exam 1635 hr compared to 10/01/2014 at 0741 hr  FINDINGS: Enlargement of cardiac silhouette post CABG.  Pulmonary vascular congestion.  Improved pulmonary edema.  Small bibasilar pleural effusions and atelectasis.  No segmental consolidation or pneumothorax.  Atherosclerotic calcification aorta.  Bones demineralized with posttraumatic deformity of the proximal RIGHT humerus.  IMPRESSION: Improved pulmonary edema/CHF.  Persistent bibasilar effusions atelectasis.   Electronically Signed   By: Lavonia Dana M.D.   On: 10/01/2014 16:51   Dg Abd Acute W/chest  10/01/2014   CLINICAL  DATA:  Vomiting.  EXAM: ACUTE ABDOMEN SERIES (ABDOMEN 2 VIEW & CHEST 1 VIEW)  COMPARISON:  05/28/2014  FINDINGS: There is diffuse interstitial and airspace opacity which is fairly symmetric. Small bilateral pleural effusion. The patient is status post CABG. Mild cardiomegaly, stable from previous.  Nonobstructive bowel gas pattern. No evidence of pneumoperitoneum. The concerning intra-abdominal mass effect or calcification. Status post proximal left femur ORIF.  IMPRESSION: 1. Pulmonary edema and small pleural effusions. 2. No evidence of bowel obstruction.   Electronically Signed   By: Jorje Guild M.D.   On: 10/01/2014 07:59    Microbiology: Recent Results (from the past 240 hour(s))  Urine culture     Status: None   Collection Time: 10/01/14  7:10 AM  Result Value Ref Range Status   Specimen Description URINE, CATHETERIZED  Final   Special Requests NONE  Final   Culture  Setup Time   Final    10/01/2014 14:29 Performed at [facility] Performed at [facility]   Final   Culture NO GROWTH Performed at [facility]   Final   Report Status 10/02/2014 FINAL  Final  Culture, blood (routine x 2)     Status: None (Preliminary result)   Collection Time: 10/01/14  7:31 AM  Result Value Ref Range Status   Specimen Description BLOOD RIGHT ARM  Final   Special Requests BOTTLES DRAWN AEROBIC ONLY 10CC  Final   Culture NO GROWTH 4 DAYS  Final   Report Status PENDING  Incomplete  Culture, blood (routine x 2)     Status: None (Preliminary result)   Collection Time: 10/01/14  7:32 AM  Result Value Ref Range Status   Specimen Description BLOOD LEFT ARM  Final   Special Requests BOTTLES DRAWN AEROBIC AND ANAEROBIC 10CC  Final   Culture NO GROWTH 4 DAYS  Final   Report Status PENDING  Incomplete     Labs: Basic Metabolic Panel:  Recent Labs Lab 10/01/14 0640 10/02/14 0034 10/03/14 0816 10/04/14 0626 10/05/14 0639  NA 139 136* 140 139  137  K 4.4 3.8 3.5* 3.9 4.3  CL 102 99 95* 97 99  CO2 23 25 31 31 30   GLUCOSE 332* 185* 160* 180* 185*  BUN 18 18 17  29* 24*  CREATININE 0.79 0.88 0.79 1.03 0.89  CALCIUM 8.5 8.4 9.2 9.1 8.9   Liver Function Tests:  Recent Labs Lab 10/01/14 0640  AST 52*  ALT 33  ALKPHOS 96  BILITOT 0.6  PROT 6.4  ALBUMIN 3.2*    Recent Labs Lab 10/01/14 0640  LIPASE 44   No results for input(s): AMMONIA in the last 168 hours. CBC:  Recent Labs Lab 10/01/14 0640 10/02/14 0034  WBC 13.7* 7.4  NEUTROABS 11.1*  --   HGB 10.9*  9.5*  HCT 32.9* 28.7*  MCV 90.9 89.1  PLT 211 189   Cardiac Enzymes:  Recent Labs Lab 10/01/14 0640 10/01/14 1238 10/01/14 1835 10/02/14 0034  TROPONINI <0.30 2.83* 1.44* 1.06*   BNP: BNP (last 3 results)  Recent Labs  10/01/14 0801  PROBNP 3877.0*   CBG:  Recent Labs Lab 10/03/14 0745 10/03/14 1129 10/03/14 1613 10/03/14 2031 10/05/14 0815  GLUCAP 147* 127* 240* 246* 167*       Signed:  Reign Dziuba  [facility] 10/05/2014, 1:14 PM

## 2014-10-06 LAB — GLUCOSE, CAPILLARY
GLUCOSE-CAPILLARY: 120 mg/dL — AB (ref 70–99)
GLUCOSE-CAPILLARY: 173 mg/dL — AB (ref 70–99)
GLUCOSE-CAPILLARY: 259 mg/dL — AB (ref 70–99)
Glucose-Capillary: 175 mg/dL — ABNORMAL HIGH (ref 70–99)
Glucose-Capillary: 228 mg/dL — ABNORMAL HIGH (ref 70–99)
Glucose-Capillary: 214 mg/dL — ABNORMAL HIGH (ref 70–99)

## 2014-10-06 LAB — CULTURE, BLOOD (ROUTINE X 2)
Culture: NO GROWTH
Culture: NO GROWTH

## 2014-10-19 ENCOUNTER — Ambulatory Visit (HOSPITAL_COMMUNITY)
Admission: RE | Admit: 2014-10-19 | Discharge: 2014-10-19 | Disposition: A | Discharge status: Home / Self Care | Payer: Medicare Other | Source: Ambulatory Visit | Attending: Adult Health | Admitting: Adult Health

## 2014-10-19 ENCOUNTER — Other Ambulatory Visit: Payer: Self-pay | Admitting: Internal Medicine

## 2014-10-19 ENCOUNTER — Encounter: Payer: Self-pay | Admitting: Adult Health

## 2014-10-19 ENCOUNTER — Ambulatory Visit (INDEPENDENT_AMBULATORY_CARE_PROVIDER_SITE_OTHER): Payer: Medicare Other | Admitting: Adult Health

## 2014-10-19 DIAGNOSIS — K5900 Constipation, unspecified: Secondary | ICD-10-CM | POA: Diagnosis present

## 2014-10-19 DIAGNOSIS — K5649 Other impaction of intestine: Secondary | ICD-10-CM

## 2014-10-19 DIAGNOSIS — D509 Iron deficiency anemia, unspecified: Secondary | ICD-10-CM

## 2014-10-19 DIAGNOSIS — N289 Disorder of kidney and ureter, unspecified: Secondary | ICD-10-CM

## 2014-10-19 DIAGNOSIS — I255 Ischemic cardiomyopathy: Secondary | ICD-10-CM

## 2014-10-19 NOTE — Patient Instructions (Signed)
Your physician recommends that you schedule a follow-up appointment in: 1 month  Your physician recommends that you return for lab work CBC/BMP/UA  Your physician recommends you have an abdominal film  Your physician recommends that you continue on your current medications as directed. Please refer to the Current Medication list given to you today.  PLEASE MAKE APPOINTMENT WITH YOUR ENDOCRINOLOGIST FOR BLOOD SUGAR CONTROL  Thank you for choosing [facility]!!

## 2014-10-19 NOTE — Progress Notes (Signed)
HPI: Alicia Mason is a 80 year old patient of Dr. Chauncy Passy he saw for the first time during consultation in early November of 2015 in the setting of acute on chronic combined systolic and diastolic heart failure, echocardiogram at that time revealed an EF of 40-45% with grade 2 diastolic dysfunction, ischemic cardiomyopathy, with a history of coronary artery bypass grafting in 1999, non-ST elevated MI during recent hospitalization, paroxysmal atrial fibrillation, on anticoagulation with Xarelto.   She was discharged from the hospital on 10/05/2014, where she was diuresed 6 liters, with a discharge weight of 112 pounds.she was started on the lisinopril , but this was discontinued due to prerenal azotemia, she also was started on Lasix, 20 mg twice a day continued on metoprolol 50 mg twice a day, along with anticoagulation therapy. Discharge creatinine on 10/05/2014 was 0.89.  She comes with her caregiver who states she has been complaining of back pain, on the left. She is not eating or drinking very much and has not had a BM in approx 5 days. She also states that her BG is remaining elevated into the 300's. She sees Dr. Dorris Fetch for diabetes control.,  Allergies  Allergen Reactions  . Cephalexin     NOT SURE OF THE REACTION, BUT WAS TOLD NOT TO TAKE IT. Granddaughter mentioned that patient had hives, rash. Denies anaphylactic reaction.    Current Outpatient Prescriptions  Medication Sig Dispense Refill  . acetaminophen-codeine (TYLENOL #3) 300-30 MG per tablet Take one tablet by mouth half hour prior to therapy and every 4 hours as needed for pain 180 tablet 0  . ALPRAZolam (XANAX) 0.5 MG tablet Take one tablet by mouth every 8 hours as needed for anxiety 90 tablet 0  . bisacodyl (DULCOLAX) 10 MG suppository Place 1 suppository (10 mg total) rectally daily as needed for moderate constipation. 12 suppository 0  . famotidine (PEPCID) 20 MG tablet Take 1 tablet (20 mg total) by mouth daily. Can  buy over-the-counter    . furosemide (LASIX) 20 MG tablet Take 1 tablet (20 mg total) by mouth 2 (two) times daily. For treatment of heart failure. 60 tablet 3  . insulin aspart (NOVOLOG) 100 UNIT/ML injection Inject 5 Units into the skin 3 (three) times daily with meals. 10 mL 11  . insulin glargine (LANTUS) 100 UNIT/ML injection Inject 10 Units into the skin at bedtime.    . metoprolol (LOPRESSOR) 50 MG tablet Take 1 tablet (50 mg total) by mouth 2 (two) times daily. 60 tablet 0  . MYRBETRIQ 25 MG TB24 tablet Take 25 mg by mouth daily.  0  . potassium chloride (K-DUR) 10 MEQ tablet Take 1 tablet (10 mEq total) by mouth 2 (two) times daily. Potassium. Take each tablet with furosemide twice daily. 60 tablet 3  . pravastatin (PRAVACHOL) 10 MG tablet Take 10 mg by mouth every morning.     . Rivaroxaban (XARELTO) 15 MG TABS tablet Take 1 tablet (15 mg total) by mouth daily before breakfast. 30 tablet   . senna (SENOKOT) 8.6 MG TABS tablet Take 1 tablet (8.6 mg total) by mouth 2 (two) times daily. 120 each 0  . sitaGLIPtin (JANUVIA) 50 MG tablet Take 50 mg by mouth every morning.     No current facility-administered medications for this visit.    Past Medical History  Diagnosis Date  . Essential hypertension, benign   . Type 2 diabetes mellitus   . Coronary atherosclerosis of native coronary artery 1999    CABG LIMA to  LAD, SVG to OM1 and OM2 and SVG to PDA in 1999,   . Closed fracture of right humerus     Managed conservatively 2011  . Cerebrovascular disease   . Seasonal allergies   . Arthritis   . Vertigo   . Ischemic cardiomyopathy     45-50% 07/2013/ EF 40% per Echo 06/2011.  Marland Kitchen Near syncope   . NSTEMI (non-ST elevated myocardial infarction)     10/02/2014 & 06/2011.  . Diabetes mellitus   . Acute ischemic stroke 08/04/2013    Right MCA stroke  . Atrial fibrillation 05/28/2009    Paroxysmal    . Dyslipidemia   . Chronic anxiety   . Acute on chronic combined systolic and diastolic  heart failure 33/29/5188    EF 41-66%; grade 2 diastolic dysfunction  . Dementia     Past Surgical History  Procedure Laterality Date  . Coronary artery bypass graft  1999    LIMA to LAD, SVG to OM1 and OM2, SVG to PDA  . Cataract surgery    . Abdominal hysterectomy      WITH BSO.  . Orif hip fracture Left 05/30/2014    Procedure: COMPRESSION HIP SCREW LEFT HIP;  Surgeon: Sanjuana Kava, MD;  Location: [facility];  Service: Orthopedics;  Laterality: Left;    ROS:  Complete review of systems performed and found to be negative unless outlined above  PHYSICAL EXAM BP 118/60 mmHg  Pulse 75  Ht 5\' 5"  (1.651 m)  Wt   SpO2 99%  General: Frail in wheel chair.  Head: Eyes PERRLA, No xanthomas.   Normal cephalic and atramatic  Lungs: Clear bilaterally to auscultation and percussion. Heart: HRRR S1 S2, without MRG.  Pulses are 2+ & equal.            No carotid bruit. No JVD.  No abdominal bruits. No femoral bruits. Abdomen: Bowel sounds are positive, abdomen soft and non-tender without masses or                  Hernia's noted. Msk:  Back normal.  Diminished strength and tone for age. Extremities: No clubbing, cyanosis or edema. Bandage to pre-tibial area of legs bilaterally.  DP diminished.  Neuro: Alert and oriented X 3. Psych:  Good affect, responds appropriately  ASSESSMENT AND PLAN

## 2014-10-19 NOTE — Assessment & Plan Note (Signed)
Checking CBC.  °

## 2014-10-19 NOTE — Assessment & Plan Note (Signed)
I will check UA for UTI as she is having back pain. Also abdominal films due to constipation. I think its because she is not eating or drinking much but to be thorough will check as she is on diuretics and may be a little dehydrated.

## 2014-10-19 NOTE — Progress Notes (Deleted)
Name: Alicia Mason    DOB: 16-Feb-1922  Age: 80 y.o.  MR#: [id number]       PCP:  Glo Herring., MD      Insurance: Payor: MEDICARE / Plan: MEDICARE PART A AND B / Product Type: *No Product type* /   CC:    Chief Complaint  Patient presents with  . Atrial Fibrillation  . Hypertension  . Congestive Heart Failure    VS Filed Vitals:   10/19/14 1348  BP: 118/60  Pulse: 75  Height: 5\' 5"  (1.651 m)  SpO2: 99%    Weights Current Weight  10/05/14 112 lb 3.4 oz (50.9 kg)  05/28/14 119 lb 11.4 oz (54.3 kg)  08/07/13 152 lb 12.5 oz (69.3 kg)    Blood Pressure  BP Readings from Last 3 Encounters:  10/19/14 118/60  10/05/14 135/61  06/01/14 112/82     Admit date:  (Not on file) Last encounter with RMR:  Visit date not found   Allergy Cephalexin  Current Outpatient Prescriptions  Medication Sig Dispense Refill  . acetaminophen-codeine (TYLENOL #3) 300-30 MG per tablet Take one tablet by mouth half hour prior to therapy and every 4 hours as needed for pain 180 tablet 0  . ALPRAZolam (XANAX) 0.5 MG tablet Take one tablet by mouth every 8 hours as needed for anxiety 90 tablet 0  . bisacodyl (DULCOLAX) 10 MG suppository Place 1 suppository (10 mg total) rectally daily as needed for moderate constipation. 12 suppository 0  . famotidine (PEPCID) 20 MG tablet Take 1 tablet (20 mg total) by mouth daily. Can buy over-the-counter    . furosemide (LASIX) 20 MG tablet Take 1 tablet (20 mg total) by mouth 2 (two) times daily. For treatment of heart failure. 60 tablet 3  . insulin aspart (NOVOLOG) 100 UNIT/ML injection Inject 5 Units into the skin 3 (three) times daily with meals. 10 mL 11  . insulin glargine (LANTUS) 100 UNIT/ML injection Inject 10 Units into the skin at bedtime.    . metoprolol (LOPRESSOR) 50 MG tablet Take 1 tablet (50 mg total) by mouth 2 (two) times daily. 60 tablet 0  . MYRBETRIQ 25 MG TB24 tablet Take 25 mg by mouth daily.  0  . potassium chloride (K-DUR) 10 MEQ  tablet Take 1 tablet (10 mEq total) by mouth 2 (two) times daily. Potassium. Take each tablet with furosemide twice daily. 60 tablet 3  . pravastatin (PRAVACHOL) 10 MG tablet Take 10 mg by mouth every morning.     . Rivaroxaban (XARELTO) 15 MG TABS tablet Take 1 tablet (15 mg total) by mouth daily before breakfast. 30 tablet   . senna (SENOKOT) 8.6 MG TABS tablet Take 1 tablet (8.6 mg total) by mouth 2 (two) times daily. 120 each 0  . sitaGLIPtin (JANUVIA) 50 MG tablet Take 50 mg by mouth every morning.     No current facility-administered medications for this visit.    Discontinued Meds:    Medications Discontinued During This Encounter  Medication Reason  . UNKNOWN TO PATIENT Error    Patient Active Problem List   Diagnosis Date Noted  . Acute on chronic combined systolic and diastolic heart failure 17/61/6073  . Paroxysmal atrial fibrillation 10/01/2014  . Essential hypertension 10/01/2014  . History of arterial ischemic stroke 10/01/2014  . Chronic anxiety 10/01/2014  . Anemia due to other cause 10/01/2014  . Protein-calorie malnutrition, severe 05/29/2014  . Hip fracture 05/28/2014  . Hip fracture 05/28/2014  . Dermatitis 09/27/2013  .  Hypokalemia 09/27/2013  . Anxiety 09/27/2013  . Dyslipidemia 08/07/2013  . Abnormal thyroid blood test 08/06/2013  . Anemia 08/06/2013  . Acute ischemic stroke 08/04/2013  . Fever 08/03/2013  . Acute encephalopathy 08/03/2013  . Hypoglycemia 08/03/2013  . UTI (lower urinary tract infection) 07/30/2013  . Confusion 07/30/2013  . Ataxia 07/30/2013  . Ischemic cardiomyopathy 07/13/2011  . Bradycardia 07/12/2011  . Near syncope 07/11/2011  . NSTEMI (non-ST elevated myocardial infarction) 07/11/2011  . CAD (coronary artery disease) 07/11/2011  . Hx of CABG 07/11/2011  . DM type 2 (diabetes mellitus, type 2) 07/11/2011  . HTN (hypertension), malignant 07/11/2011  . Cerebrovascular disease 07/11/2011  . Vertigo 07/11/2011  . FRACTURE,  HUMERUS, PROXIMAL 11/24/2009  . IDDM 05/28/2009  . HYPERTENSION 05/28/2009  . CAD 05/28/2009  . Atrial fibrillation 05/28/2009  . GERD 05/28/2009  . Osteoarthritis 05/28/2009    LABS    Component Value Date/Time   NA 137 10/05/2014 0639   NA 139 10/04/2014 0626   NA 140 10/03/2014 0816   K 4.3 10/05/2014 0639   K 3.9 10/04/2014 0626   K 3.5* 10/03/2014 0816   CL 99 10/05/2014 0639   CL 97 10/04/2014 0626   CL 95* 10/03/2014 0816   CO2 30 10/05/2014 0639   CO2 31 10/04/2014 0626   CO2 31 10/03/2014 0816   GLUCOSE 185* 10/05/2014 0639   GLUCOSE 180* 10/04/2014 0626   GLUCOSE 160* 10/03/2014 0816   BUN 24* 10/05/2014 0639   BUN 29* 10/04/2014 0626   BUN 17 10/03/2014 0816   CREATININE 0.89 10/05/2014 0639   CREATININE 1.03 10/04/2014 0626   CREATININE 0.79 10/03/2014 0816   CALCIUM 8.9 10/05/2014 0639   CALCIUM 9.1 10/04/2014 0626   CALCIUM 9.2 10/03/2014 0816   GFRNONAA 55* 10/05/2014 0639   GFRNONAA 46* 10/04/2014 0626   GFRNONAA 71* 10/03/2014 0816   GFRAA 64* 10/05/2014 0639   GFRAA 54* 10/04/2014 0626   GFRAA 82* 10/03/2014 0816   CMP     Component Value Date/Time   NA 137 10/05/2014 0639   K 4.3 10/05/2014 0639   CL 99 10/05/2014 0639   CO2 30 10/05/2014 0639   GLUCOSE 185* 10/05/2014 0639   BUN 24* 10/05/2014 0639   CREATININE 0.89 10/05/2014 0639   CALCIUM 8.9 10/05/2014 0639   PROT 6.4 10/01/2014 0640   ALBUMIN 3.2* 10/01/2014 0640   AST 52* 10/01/2014 0640   ALT 33 10/01/2014 0640   ALKPHOS 96 10/01/2014 0640   BILITOT 0.6 10/01/2014 0640   GFRNONAA 55* 10/05/2014 0639   GFRAA 64* 10/05/2014 0639       Component Value Date/Time   WBC 7.4 10/02/2014 0034   WBC 13.7* 10/01/2014 0640   WBC 9.0 06/01/2014 0718   HGB 9.5* 10/02/2014 0034   HGB 10.9* 10/01/2014 0640   HGB 9.5* 06/01/2014 0718   HCT 28.7* 10/02/2014 0034   HCT 32.9* 10/01/2014 0640   HCT 26.4* 06/01/2014 0718   MCV 89.1 10/02/2014 0034   MCV 90.9 10/01/2014 0640   MCV 90.1  06/01/2014 0718    Lipid Panel     Component Value Date/Time   CHOL 112 10/02/2014 0034   TRIG 88 10/02/2014 0034   HDL 38* 10/02/2014 0034   CHOLHDL 2.9 10/02/2014 0034   VLDL 18 10/02/2014 0034   LDLCALC 56 10/02/2014 0034    ABG    Component Value Date/Time   PHART 7.509* 08/03/2013 2135   PCO2ART 29.8* 08/03/2013 2135   PO2ART 87.2  08/03/2013 2135   HCO3 23.5 08/03/2013 2135   TCO2 20.7 08/03/2013 2135   O2SAT 97.7 08/03/2013 2135     Lab Results  Component Value Date   TSH 1.680 10/01/2014   BNP (last 3 results)  Recent Labs  10/01/14 0801  PROBNP 3877.0*   Cardiac Panel (last 3 results) No results for input(s): CKTOTAL, CKMB, TROPONINI, RELINDX in the last 72 hours.  Iron/TIBC/Ferritin/ %Sat No results found for: IRON, TIBC, FERRITIN, IRONPCTSAT   EKG Orders placed or performed during the hospital encounter of 10/01/14  . ED EKG  . ED EKG  . EKG 12-Lead  . EKG 12-Lead  . EKG 12-Lead  . EKG 12-Lead  . EKG     Prior Assessment and Plan Problem List as of 10/19/2014      Cardiovascular and Mediastinum   CAD (coronary artery disease)   Last Assessment & Plan   07/31/2011 Office Visit Edited 07/31/2011  2:56 PM by Lendon Colonel, NP    She has been completely asymptomatic with any chest pain. She is more active and is actually using her riding lawn mower again.  Decreased dose of Toprol did not elicit angina.  Will continue this dose.  Stress test completed on 07/27/2011 demonstrated "Probably negative pharmacologic stress nuclear myocardial study, revealing normal LV size and overall systolic fx and no stress induced EKG abnormalities.. The fairly profound but small defect at the base of the inferior/inferolateral wall appears to represent superimposed breast and diaphragmatic attenatuation.  She is reassured by this.    HTN (hypertension), malignant   Cerebrovascular disease   Ischemic cardiomyopathy   HYPERTENSION   CAD   Atrial fibrillation    Near syncope   NSTEMI (non-ST elevated myocardial infarction)   Acute ischemic stroke   Acute on chronic combined systolic and diastolic heart failure   Paroxysmal atrial fibrillation   Essential hypertension     Digestive   GERD     Endocrine   DM type 2 (diabetes mellitus, type 2)   IDDM   Hypoglycemia     Nervous and Auditory   Confusion   Acute encephalopathy     Musculoskeletal and Integument   Osteoarthritis   FRACTURE, HUMERUS, PROXIMAL   Dermatitis   Hip fracture   Hip fracture     Genitourinary   UTI (lower urinary tract infection)     Other   Hx of CABG   Vertigo   Last Assessment & Plan   07/31/2011 Office Visit Written 07/31/2011  2:58 PM by Lendon Colonel, NP    She has had no further episodes of dizziness or syncope.  She is allowed to drive cautiously. She states she does not drive in the bright sun because it causes excessive tearing, but will drive in the morning and evening.  She is happy to be able to drive again. She will see Dr. Verl Blalock in 6 months but call if she is symptomatic.    Bradycardia   Ataxia   Fever   Abnormal thyroid blood test   Anemia   Dyslipidemia   Hypokalemia   Anxiety   Protein-calorie malnutrition, severe   History of arterial ischemic stroke   Chronic anxiety   Anemia due to other cause       Imaging: Dg Chest Port 1v Same Day  10/01/2014   CLINICAL DATA:  Coronary disease post CABG, history stroke, type 2 diabetes, hypertension, dyspnea, hypoxia, pulmonary edema and small BILATERAL pleural effusions, followup  EXAM: PORTABLE CHEST -  1 VIEW SAME DAY  COMPARISON:  Portable exam 1635 hr compared to 10/01/2014 at 0741 hr  FINDINGS: Enlargement of cardiac silhouette post CABG.  Pulmonary vascular congestion.  Improved pulmonary edema.  Small bibasilar pleural effusions and atelectasis.  No segmental consolidation or pneumothorax.  Atherosclerotic calcification aorta.  Bones demineralized with posttraumatic deformity of the  proximal RIGHT humerus.  IMPRESSION: Improved pulmonary edema/CHF.  Persistent bibasilar effusions atelectasis.   Electronically Signed   By: Lavonia Dana M.D.   On: 10/01/2014 16:51   Dg Abd Acute W/chest  10/01/2014   CLINICAL DATA:  Vomiting.  EXAM: ACUTE ABDOMEN SERIES (ABDOMEN 2 VIEW & CHEST 1 VIEW)  COMPARISON:  05/28/2014  FINDINGS: There is diffuse interstitial and airspace opacity which is fairly symmetric. Small bilateral pleural effusion. The patient is status post CABG. Mild cardiomegaly, stable from previous.  Nonobstructive bowel gas pattern. No evidence of pneumoperitoneum. The concerning intra-abdominal mass effect or calcification. Status post proximal left femur ORIF.  IMPRESSION: 1. Pulmonary edema and small pleural effusions. 2. No evidence of bowel obstruction.   Electronically Signed   By: Jorje Guild M.D.   On: 10/01/2014 07:59

## 2014-10-19 NOTE — Assessment & Plan Note (Signed)
She is well compensated and is actually losing wt per her caregiver with wt at 109 at her home last week. No evidence of fluid retention. I will check a BMET.

## 2014-10-19 NOTE — Assessment & Plan Note (Signed)
Blood pressure is well controlled today. Will not make any changes at present, She is wheelchair bound due to deconditioning and not active. She is very frail. Do not want to drop her BP any further.

## 2014-10-19 NOTE — Assessment & Plan Note (Signed)
Heart rate is well controlled on metoprolol 50 mg BID. She has no excessive bleeding or bruising. I will check a CBC to evaluate status.

## 2014-10-20 ENCOUNTER — Telehealth: Payer: Self-pay | Admitting: *Deleted

## 2014-10-20 ENCOUNTER — Other Ambulatory Visit: Payer: Self-pay | Admitting: Internal Medicine

## 2014-10-20 ENCOUNTER — Telehealth: Payer: Self-pay

## 2014-10-20 LAB — BASIC METABOLIC PANEL WITH GFR
BUN: 26 mg/dL — ABNORMAL HIGH (ref 6–23)
CHLORIDE: 91 meq/L — AB (ref 96–112)
CO2: 32 mEq/L (ref 19–32)
Calcium: 9.4 mg/dL (ref 8.4–10.5)
Creat: 0.95 mg/dL (ref 0.50–1.10)
GFR, Est African American: 61 mL/min
GFR, Est Non African American: 53 mL/min — ABNORMAL LOW
Glucose, Bld: 286 mg/dL — ABNORMAL HIGH (ref 70–99)
Potassium: 4.6 mEq/L (ref 3.5–5.3)
Sodium: 132 mEq/L — ABNORMAL LOW (ref 135–145)

## 2014-10-20 LAB — CBC WITH DIFFERENTIAL/PLATELET
BASOS PCT: 0 % (ref 0–1)
Basophils Absolute: 0 10*3/uL (ref 0.0–0.1)
EOS ABS: 0.2 10*3/uL (ref 0.0–0.7)
Eosinophils Relative: 2 % (ref 0–5)
HCT: 35.9 % — ABNORMAL LOW (ref 36.0–46.0)
HEMOGLOBIN: 12.2 g/dL (ref 12.0–15.0)
LYMPHS PCT: 14 % (ref 12–46)
Lymphs Abs: 1.6 10*3/uL (ref 0.7–4.0)
MCH: 29.3 pg (ref 26.0–34.0)
MCHC: 34 g/dL (ref 30.0–36.0)
MCV: 86.1 fL (ref 78.0–100.0)
MPV: 10.7 fL (ref 9.4–12.4)
Monocytes Absolute: 0.9 10*3/uL (ref 0.1–1.0)
Monocytes Relative: 8 % (ref 3–12)
Neutro Abs: 8.8 10*3/uL — ABNORMAL HIGH (ref 1.7–7.7)
Neutrophils Relative %: 76 % (ref 43–77)
PLATELETS: 331 10*3/uL (ref 150–400)
RBC: 4.17 MIL/uL (ref 3.87–5.11)
RDW: 13.9 % (ref 11.5–15.5)
WBC: 11.6 10*3/uL — AB (ref 4.0–10.5)

## 2014-10-20 LAB — URINALYSIS
Bilirubin Urine: NEGATIVE
Glucose, UA: 500 mg/dL — AB
Hgb urine dipstick: NEGATIVE
KETONES UR: NEGATIVE mg/dL
Nitrite: NEGATIVE
Protein, ur: NEGATIVE mg/dL
Specific Gravity, Urine: 1.019 (ref 1.005–1.030)
UROBILINOGEN UA: 0.2 mg/dL (ref 0.0–1.0)
pH: 5 (ref 5.0–8.0)

## 2014-10-20 NOTE — Telephone Encounter (Signed)
Spoke with care take Alicia Mason and gave her instructions for new decreased lasix dose.She was getting ready to give patient enema as MOM did not work last night.I have faxed labs/x-ray results to Dr.Fusco and Dr.nida

## 2014-10-20 NOTE — Telephone Encounter (Signed)
-----   Message from Lendon Colonel, NP sent at 10/20/2014  7:17 AM EST ----- I have reviewed labs. Blood glucose is not well controlled at all. She has glucose in urine as well, but no infection. WBC are elevated. Sodium slightly decreased.   Decrease lasix to 20 mg daily as she was also not eating and drinking well, with constipation found on X-ray. She will need to get in touch with Dr.Nida, her endocrinologist today. Send him copy of labs and X-ray report. Also send copy to PCP. She will need follow up with them as well. She saw PCP last week and was prescribed tylenol for back pain. May need further work up.   Repeat labs in one week.

## 2014-10-20 NOTE — Telephone Encounter (Signed)
Spoke with Juliann Pulse at [facility] and advised to take pt to ED if sugar continues to be high and to contact primary Dr. For advice. Juliann Pulse said sugar had come down some but pt declined to go to the ED. Juliann Pulse stated she would get appt with primary Dr. And endocrinologist. Understood that cardiology does not normally handle blood sugar but was appreciative of a call back and advice.

## 2014-10-20 NOTE — Telephone Encounter (Signed)
-----   Message from Lendon Colonel, NP sent at 10/20/2014  4:27 PM EST ----- Do not wish to treat diabetes. Should be seen by endocrinologist when open or ER if sugar remains elevated. Also can called PCP first for advisement.  ----- Message -----    From: Massie Maroon, CMA    Sent: 10/20/2014   2:01 PM      To: Lendon Colonel, NP  Pt blood sugar has been 384 this AM. She was given 5 units of Nova log after reading and after re check this afternoon is 417 given 5 more units. Told Home health to call endocrinologist, I also called, and they are closed today. Wanting to know if we would give them some advice on sugar levels. Told home health we are cardiology but that I would ask, otherwise to take pt to ED or urgent care center.   Thanks,  [employer]

## 2014-10-22 ENCOUNTER — Other Ambulatory Visit: Payer: Self-pay | Admitting: Internal Medicine

## 2014-11-05 ENCOUNTER — Other Ambulatory Visit: Payer: Self-pay | Admitting: Internal Medicine

## 2014-11-19 ENCOUNTER — Encounter: Payer: Medicare Other | Admitting: Adult Health

## 2014-11-19 NOTE — Progress Notes (Signed)
    Error. Cancelled appt 

## 2014-11-27 ENCOUNTER — Ambulatory Visit (INDEPENDENT_AMBULATORY_CARE_PROVIDER_SITE_OTHER): Payer: Medicare Other | Admitting: Adult Health

## 2014-11-27 ENCOUNTER — Encounter: Payer: Self-pay | Admitting: Adult Health

## 2014-11-27 VITALS — BP 122/64 | HR 58 | Ht 65.0 in | Wt 113.0 lb

## 2014-11-27 DIAGNOSIS — I5043 Acute on chronic combined systolic (congestive) and diastolic (congestive) heart failure: Secondary | ICD-10-CM

## 2014-11-27 NOTE — Assessment & Plan Note (Signed)
She does not appear decompensated or symptomatic concerning breathing status. No edema. Will continue current regimen. She will be seen in 6 months.

## 2014-11-27 NOTE — Assessment & Plan Note (Signed)
Blood pressure is well controlled. Will not make changes at this time as she is not dizzy with position changes or feeling lightheaded. Will see her in 6 months unless symptomatic.

## 2014-11-27 NOTE — Patient Instructions (Signed)
Your physician wants you to follow-up in: 6 months You will receive a reminder letter in the mail two months in advance. If you don't receive a letter, please call our office to schedule the follow-up appointment.     Your physician recommends that you continue on your current medications as directed. Please refer to the Current Medication list given to you today.      Thank you for choosing [facility] !        

## 2014-11-27 NOTE — Progress Notes (Deleted)
Name: Alicia Mason    DOB: Mar 16, 1922  Age: 81 y.o.  MR#: [id number]       PCP:  Glo Herring., MD      Insurance: Payor: MEDICARE / Plan: MEDICARE PART A AND B / Product Type: *No Product type* /   CC:    Chief Complaint  Patient presents with  . Congestive Heart Failure  . Hypertension  . Cardiomyopathy    VS Filed Vitals:   11/27/14 1357  BP: 122/64  Pulse: 58  Height: 5\' 5"  (1.651 m)  Weight: 113 lb (51.256 kg)  SpO2: 97%    Weights Current Weight  11/27/14 113 lb (51.256 kg)  10/05/14 112 lb 3.4 oz (50.9 kg)  05/28/14 119 lb 11.4 oz (54.3 kg)    Blood Pressure  BP Readings from Last 3 Encounters:  11/27/14 122/64  10/19/14 118/60  10/05/14 135/61     Admit date:  (Not on file) Last encounter with RMR:  10/19/2014   Allergy Cephalexin  Current Outpatient Prescriptions  Medication Sig Dispense Refill  . acetaminophen-codeine (TYLENOL #3) 300-30 MG per tablet Take one tablet by mouth half hour prior to therapy and every 4 hours as needed for pain 180 tablet 0  . ALPRAZolam (XANAX) 0.5 MG tablet Take one tablet by mouth every 8 hours as needed for anxiety 90 tablet 0  . bisacodyl (DULCOLAX) 10 MG suppository Place 1 suppository (10 mg total) rectally daily as needed for moderate constipation. 12 suppository 0  . famotidine (PEPCID) 20 MG tablet Take 1 tablet (20 mg total) by mouth daily. Can buy over-the-counter    . furosemide (LASIX) 20 MG tablet Take 1 tablet (20 mg total) by mouth 2 (two) times daily. For treatment of heart failure. 60 tablet 3  . insulin glargine (LANTUS) 100 UNIT/ML injection Inject 10 Units into the skin at bedtime.    . metoprolol (LOPRESSOR) 50 MG tablet Take 1 tablet (50 mg total) by mouth 2 (two) times daily. 60 tablet 0  . potassium chloride (K-DUR) 10 MEQ tablet Take 1 tablet (10 mEq total) by mouth 2 (two) times daily. Potassium. Take each tablet with furosemide twice daily. 60 tablet 3  . pravastatin (PRAVACHOL) 10 MG tablet Take  10 mg by mouth every morning.     . Rivaroxaban (XARELTO) 15 MG TABS tablet Take 1 tablet (15 mg total) by mouth daily before breakfast. 30 tablet   . senna (SENOKOT) 8.6 MG TABS tablet Take 1 tablet (8.6 mg total) by mouth 2 (two) times daily. 120 each 0  . sitaGLIPtin (JANUVIA) 50 MG tablet Take 50 mg by mouth every morning.     No current facility-administered medications for this visit.    Discontinued Meds:    Medications Discontinued During This Encounter  Medication Reason  . insulin aspart (NOVOLOG) 100 UNIT/ML injection Discontinued by provider  . MYRBETRIQ 25 MG TB24 tablet Discontinued by provider    Patient Active Problem List   Diagnosis Date Noted  . Acute on chronic combined systolic and diastolic heart failure 07/03/4817  . Paroxysmal atrial fibrillation 10/01/2014  . Essential hypertension 10/01/2014  . History of arterial ischemic stroke 10/01/2014  . Chronic anxiety 10/01/2014  . Anemia due to other cause 10/01/2014  . Protein-calorie malnutrition, severe 05/29/2014  . Hip fracture 05/28/2014  . Hip fracture 05/28/2014  . Dermatitis 09/27/2013  . Hypokalemia 09/27/2013  . Anxiety 09/27/2013  . Dyslipidemia 08/07/2013  . Abnormal thyroid blood test 08/06/2013  . Anemia 08/06/2013  .  Acute ischemic stroke 08/04/2013  . Fever 08/03/2013  . Acute encephalopathy 08/03/2013  . Hypoglycemia 08/03/2013  . UTI (lower urinary tract infection) 07/30/2013  . Confusion 07/30/2013  . Ataxia 07/30/2013  . Ischemic cardiomyopathy 07/13/2011  . Bradycardia 07/12/2011  . Near syncope 07/11/2011  . NSTEMI (non-ST elevated myocardial infarction) 07/11/2011  . CAD (coronary artery disease) 07/11/2011  . Hx of CABG 07/11/2011  . DM type 2 (diabetes mellitus, type 2) 07/11/2011  . HTN (hypertension), malignant 07/11/2011  . Cerebrovascular disease 07/11/2011  . Vertigo 07/11/2011  . FRACTURE, HUMERUS, PROXIMAL 11/24/2009  . IDDM 05/28/2009  . HYPERTENSION 05/28/2009   . CAD 05/28/2009  . Atrial fibrillation 05/28/2009  . GERD 05/28/2009  . Osteoarthritis 05/28/2009    LABS    Component Value Date/Time   NA 132* 10/19/2014 1549   NA 137 10/05/2014 0639   NA 139 10/04/2014 0626   K 4.6 10/19/2014 1549   K 4.3 10/05/2014 0639   K 3.9 10/04/2014 0626   CL 91* 10/19/2014 1549   CL 99 10/05/2014 0639   CL 97 10/04/2014 0626   CO2 32 10/19/2014 1549   CO2 30 10/05/2014 0639   CO2 31 10/04/2014 0626   GLUCOSE 286* 10/19/2014 1549   GLUCOSE 185* 10/05/2014 0639   GLUCOSE 180* 10/04/2014 0626   BUN 26* 10/19/2014 1549   BUN 24* 10/05/2014 0639   BUN 29* 10/04/2014 0626   CREATININE 0.95 10/19/2014 1549   CREATININE 0.89 10/05/2014 0639   CREATININE 1.03 10/04/2014 0626   CREATININE 0.79 10/03/2014 0816   CALCIUM 9.4 10/19/2014 1549   CALCIUM 8.9 10/05/2014 0639   CALCIUM 9.1 10/04/2014 0626   GFRNONAA 53* 10/19/2014 1549   GFRNONAA 55* 10/05/2014 0639   GFRNONAA 46* 10/04/2014 0626   GFRNONAA 71* 10/03/2014 0816   GFRAA 61 10/19/2014 1549   GFRAA 64* 10/05/2014 0639   GFRAA 54* 10/04/2014 0626   GFRAA 82* 10/03/2014 0816   CMP     Component Value Date/Time   NA 132* 10/19/2014 1549   K 4.6 10/19/2014 1549   CL 91* 10/19/2014 1549   CO2 32 10/19/2014 1549   GLUCOSE 286* 10/19/2014 1549   BUN 26* 10/19/2014 1549   CREATININE 0.95 10/19/2014 1549   CREATININE 0.89 10/05/2014 0639   CALCIUM 9.4 10/19/2014 1549   PROT 6.4 10/01/2014 0640   ALBUMIN 3.2* 10/01/2014 0640   AST 52* 10/01/2014 0640   ALT 33 10/01/2014 0640   ALKPHOS 96 10/01/2014 0640   BILITOT 0.6 10/01/2014 0640   GFRNONAA 53* 10/19/2014 1549   GFRNONAA 55* 10/05/2014 0639   GFRAA 61 10/19/2014 1549   GFRAA 64* 10/05/2014 0639       Component Value Date/Time   WBC 11.6* 10/19/2014 1549   WBC 7.4 10/02/2014 0034   WBC 13.7* 10/01/2014 0640   HGB 12.2 10/19/2014 1549   HGB 9.5* 10/02/2014 0034   HGB 10.9* 10/01/2014 0640   HCT 35.9* 10/19/2014 1549   HCT  28.7* 10/02/2014 0034   HCT 32.9* 10/01/2014 0640   MCV 86.1 10/19/2014 1549   MCV 89.1 10/02/2014 0034   MCV 90.9 10/01/2014 0640    Lipid Panel     Component Value Date/Time   CHOL 112 10/02/2014 0034   TRIG 88 10/02/2014 0034   HDL 38* 10/02/2014 0034   CHOLHDL 2.9 10/02/2014 0034   VLDL 18 10/02/2014 0034   LDLCALC 56 10/02/2014 0034    ABG    Component Value Date/Time   PHART  7.509* 08/03/2013 2135   PCO2ART 29.8* 08/03/2013 2135   PO2ART 87.2 08/03/2013 2135   HCO3 23.5 08/03/2013 2135   TCO2 20.7 08/03/2013 2135   O2SAT 97.7 08/03/2013 2135     Lab Results  Component Value Date   TSH 1.680 10/01/2014   BNP (last 3 results)  Recent Labs  10/01/14 0801  PROBNP 3877.0*   Cardiac Panel (last 3 results) No results for input(s): CKTOTAL, CKMB, TROPONINI, RELINDX in the last 72 hours.  Iron/TIBC/Ferritin/ %Sat No results found for: IRON, TIBC, FERRITIN, IRONPCTSAT   EKG Orders placed or performed during the hospital encounter of 10/01/14  . ED EKG  . ED EKG  . EKG 12-Lead  . EKG 12-Lead  . EKG 12-Lead  . EKG 12-Lead  . EKG     Prior Assessment and Plan Problem List as of 11/27/2014      Cardiovascular and Mediastinum   CAD (coronary artery disease)   Last Assessment & Plan   07/31/2011 Office Visit Edited 07/31/2011  2:56 PM by Lendon Colonel, NP    She has been completely asymptomatic with any chest pain. She is more active and is actually using her riding lawn mower again.  Decreased dose of Toprol did not elicit angina.  Will continue this dose.  Stress test completed on 07/27/2011 demonstrated "Probably negative pharmacologic stress nuclear myocardial study, revealing normal LV size and overall systolic fx and no stress induced EKG abnormalities.. The fairly profound but small defect at the base of the inferior/inferolateral wall appears to represent superimposed breast and diaphragmatic attenatuation.  She is reassured by this.    HTN  (hypertension), malignant   Last Assessment & Plan   10/19/2014 Office Visit Written 10/19/2014  2:24 PM by Lendon Colonel, NP    Blood pressure is well controlled today. Will not make any changes at present, She is wheelchair bound due to deconditioning and not active. She is very frail. Do not want to drop her BP any further.     Cerebrovascular disease   Ischemic cardiomyopathy   HYPERTENSION   CAD   Atrial fibrillation   Last Assessment & Plan   10/19/2014 Office Visit Written 10/19/2014  2:26 PM by Lendon Colonel, NP    Heart rate is well controlled on metoprolol 50 mg BID. She has no excessive bleeding or bruising. I will check a CBC to evaluate status.     Near syncope   NSTEMI (non-ST elevated myocardial infarction)   Acute ischemic stroke   Acute on chronic combined systolic and diastolic heart failure   Last Assessment & Plan   10/19/2014 Office Visit Written 10/19/2014  2:27 PM by Lendon Colonel, NP    She is well compensated and is actually losing wt per her caregiver with wt at 109 at her home last week. No evidence of fluid retention. I will check a BMET.     Paroxysmal atrial fibrillation   Essential hypertension     Digestive   GERD     Endocrine   DM type 2 (diabetes mellitus, type 2)   IDDM   Hypoglycemia     Nervous and Auditory   Confusion   Acute encephalopathy     Musculoskeletal and Integument   Osteoarthritis   FRACTURE, HUMERUS, PROXIMAL   Dermatitis   Hip fracture   Hip fracture     Genitourinary   UTI (lower urinary tract infection)   Last Assessment & Plan   10/19/2014 Office Visit Written 10/19/2014  2:29 PM by Lendon Colonel, NP    I will check UA for UTI as she is having back pain. Also abdominal films due to constipation. I think its because she is not eating or drinking much but to be thorough will check as she is on diuretics and may be a little dehydrated.       Other   Hx of CABG   Vertigo   Last Assessment &  Plan   07/31/2011 Office Visit Written 07/31/2011  2:58 PM by Lendon Colonel, NP    She has had no further episodes of dizziness or syncope.  She is allowed to drive cautiously. She states she does not drive in the bright sun because it causes excessive tearing, but will drive in the morning and evening.  She is happy to be able to drive again. She will see Dr. Verl Blalock in 6 months but call if she is symptomatic.    Bradycardia   Ataxia   Fever   Abnormal thyroid blood test   Anemia   Dyslipidemia   Hypokalemia   Anxiety   Protein-calorie malnutrition, severe   History of arterial ischemic stroke   Chronic anxiety   Anemia due to other cause   Last Assessment & Plan   10/19/2014 Office Visit Written 10/19/2014  2:30 PM by Lendon Colonel, NP    Checking CBC        Imaging: No results found.

## 2014-11-27 NOTE — Progress Notes (Signed)
HPI: [name] is a 81 year old female patient of Dr. Domenic Polite .  We follow for ongoing assessment and management of ischemic heart neuropathy, EF of 40-45% with grade 2 diastolic dysfunction, CAD, with bypass in 1999, paroxysmal atrial fibrillation, on anti-coagulation with Xarelto.  She was last seen in the office in November 2015 post hospital followup where she was admitted for decompensated CHF.  At that time, she was not eating and drinking well.she will is stable from a cardiac standpoint, although very frail.  Abdominal studies were completed to evaluate for constipation.  They were found to be positive.  She was treated for that.  She is here for followup for ongoing symptom management  She comes today without any cardiac complaint. She has some lower back pain which is chronic for her, but states it has been more bothersome lately. She is not gaining wt or having issues of dyspnea or edema.   Allergies  Allergen Reactions  . Cephalexin     NOT SURE OF THE REACTION, BUT WAS TOLD NOT TO TAKE IT. Granddaughter mentioned that patient had hives, rash. Denies anaphylactic reaction.    Current Outpatient Prescriptions  Medication Sig Dispense Refill  . acetaminophen-codeine (TYLENOL #3) 300-30 MG per tablet Take one tablet by mouth half hour prior to therapy and every 4 hours as needed for pain 180 tablet 0  . ALPRAZolam (XANAX) 0.5 MG tablet Take one tablet by mouth every 8 hours as needed for anxiety 90 tablet 0  . bisacodyl (DULCOLAX) 10 MG suppository Place 1 suppository (10 mg total) rectally daily as needed for moderate constipation. 12 suppository 0  . famotidine (PEPCID) 20 MG tablet Take 1 tablet (20 mg total) by mouth daily. Can buy over-the-counter    . furosemide (LASIX) 20 MG tablet Take 1 tablet (20 mg total) by mouth 2 (two) times daily. For treatment of heart failure. 60 tablet 3  . insulin glargine (LANTUS) 100 UNIT/ML injection Inject 10 Units into the skin at bedtime.     . metoprolol (LOPRESSOR) 50 MG tablet Take 1 tablet (50 mg total) by mouth 2 (two) times daily. 60 tablet 0  . potassium chloride (K-DUR) 10 MEQ tablet Take 1 tablet (10 mEq total) by mouth 2 (two) times daily. Potassium. Take each tablet with furosemide twice daily. 60 tablet 3  . pravastatin (PRAVACHOL) 10 MG tablet Take 10 mg by mouth every morning.     . Rivaroxaban (XARELTO) 15 MG TABS tablet Take 1 tablet (15 mg total) by mouth daily before breakfast. 30 tablet   . senna (SENOKOT) 8.6 MG TABS tablet Take 1 tablet (8.6 mg total) by mouth 2 (two) times daily. 120 each 0  . sitaGLIPtin (JANUVIA) 50 MG tablet Take 50 mg by mouth every morning.     No current facility-administered medications for this visit.    Past Medical History  Diagnosis Date  . Essential hypertension, benign   . Type 2 diabetes mellitus   . Coronary atherosclerosis of native coronary artery 1999    CABG LIMA to LAD, SVG to OM1 and OM2 and SVG to PDA in 1999,   . Closed fracture of right humerus     Managed conservatively 2011  . Cerebrovascular disease   . Seasonal allergies   . Arthritis   . Vertigo   . Ischemic cardiomyopathy     45-50% 07/2013/ EF 40% per Echo 06/2011.  Marland Kitchen Near syncope   . NSTEMI (non-ST elevated myocardial infarction)  10/02/2014 & 06/2011.  . Diabetes mellitus   . Acute ischemic stroke 08/04/2013    Right MCA stroke  . Atrial fibrillation 05/28/2009    Paroxysmal    . Dyslipidemia   . Chronic anxiety   . Acute on chronic combined systolic and diastolic heart failure 12/31/1550    EF 08-02%; grade 2 diastolic dysfunction  . Dementia     Past Surgical History  Procedure Laterality Date  . Coronary artery bypass graft  1999    LIMA to LAD, SVG to OM1 and OM2, SVG to PDA  . Cataract surgery    . Abdominal hysterectomy      WITH BSO.  . Orif hip fracture Left 05/30/2014    Procedure: COMPRESSION HIP SCREW LEFT HIP;  Surgeon: Sanjuana Kava, MD;  Location: [facility];  Service:  Orthopedics;  Laterality: Left;    ROS: Complete review of systems performed and found to be negative unless outlined above  PHYSICAL EXAM BP 122/64 mmHg  Pulse 58  Ht 5\' 5"  (1.651 m)  Wt 113 lb (51.256 kg)  BMI 18.80 kg/m2  SpO2 97% General: Well developed, well nourished, in no acute distress Head: Eyes PERRLA, No xanthomas.   Normal cephalic and atramatic  Lungs: Clear bilaterally to auscultation and percussion. Heart: HRRR S1 S2, without MRG.  Pulses are 2+ & equal.            No carotid bruit. No JVD.  No abdominal bruits. No femoral bruits. Abdomen: Bowel sounds are positive, abdomen soft and non-tender without masses or                  Hernia's noted. Msk:  Kyphosis normal, slow normal gait, walker for ambulation.. Normal strength and tone for age. Extremities: No clubbing, cyanosis or edema.  DP +1 Neuro: Alert and oriented X 3. Psych:  Good affect, responds appropriately   ASSESSMENT AND PLAN

## 2014-11-27 NOTE — Assessment & Plan Note (Signed)
Having increased lower back pain. I have advised her to take Tylenol, TID for the pain. She is to discuss stronger pain control with her PCP.

## 2014-12-01 ENCOUNTER — Other Ambulatory Visit: Payer: Self-pay | Admitting: Internal Medicine

## 2014-12-03 ENCOUNTER — Other Ambulatory Visit: Payer: Self-pay | Admitting: Internal Medicine

## 2014-12-03 ENCOUNTER — Encounter (HOSPITAL_COMMUNITY): Payer: Self-pay | Admitting: Orthopaedic Surgery

## 2014-12-26 ENCOUNTER — Emergency Department (HOSPITAL_COMMUNITY): Payer: Medicare Other

## 2014-12-26 ENCOUNTER — Emergency Department (HOSPITAL_COMMUNITY)
Admission: EM | Admit: 2014-12-26 | Discharge: 2014-12-26 | Disposition: A | Discharge status: Home / Self Care | Payer: Medicare Other | Attending: Emergency Medicine | Admitting: Emergency Medicine

## 2014-12-26 ENCOUNTER — Encounter (HOSPITAL_COMMUNITY): Payer: Self-pay | Admitting: Emergency Medicine

## 2014-12-26 ENCOUNTER — Other Ambulatory Visit: Payer: Self-pay | Admitting: Internal Medicine

## 2014-12-26 DIAGNOSIS — I5043 Acute on chronic combined systolic (congestive) and diastolic (congestive) heart failure: Secondary | ICD-10-CM | POA: Insufficient documentation

## 2014-12-26 DIAGNOSIS — Z8673 Personal history of transient ischemic attack (TIA), and cerebral infarction without residual deficits: Secondary | ICD-10-CM | POA: Insufficient documentation

## 2014-12-26 DIAGNOSIS — M1990 Unspecified osteoarthritis, unspecified site: Secondary | ICD-10-CM | POA: Diagnosis not present

## 2014-12-26 DIAGNOSIS — Z951 Presence of aortocoronary bypass graft: Secondary | ICD-10-CM | POA: Diagnosis not present

## 2014-12-26 DIAGNOSIS — Z79899 Other long term (current) drug therapy: Secondary | ICD-10-CM | POA: Diagnosis not present

## 2014-12-26 DIAGNOSIS — R4182 Altered mental status, unspecified: Secondary | ICD-10-CM | POA: Diagnosis present

## 2014-12-26 DIAGNOSIS — I10 Essential (primary) hypertension: Secondary | ICD-10-CM | POA: Diagnosis not present

## 2014-12-26 DIAGNOSIS — Z8781 Personal history of (healed) traumatic fracture: Secondary | ICD-10-CM | POA: Insufficient documentation

## 2014-12-26 DIAGNOSIS — F0390 Unspecified dementia without behavioral disturbance: Secondary | ICD-10-CM | POA: Insufficient documentation

## 2014-12-26 DIAGNOSIS — I252 Old myocardial infarction: Secondary | ICD-10-CM | POA: Insufficient documentation

## 2014-12-26 DIAGNOSIS — E785 Hyperlipidemia, unspecified: Secondary | ICD-10-CM | POA: Diagnosis not present

## 2014-12-26 DIAGNOSIS — R410 Disorientation, unspecified: Secondary | ICD-10-CM

## 2014-12-26 DIAGNOSIS — F419 Anxiety disorder, unspecified: Secondary | ICD-10-CM | POA: Diagnosis not present

## 2014-12-26 DIAGNOSIS — Z794 Long term (current) use of insulin: Secondary | ICD-10-CM | POA: Diagnosis not present

## 2014-12-26 DIAGNOSIS — E119 Type 2 diabetes mellitus without complications: Secondary | ICD-10-CM | POA: Diagnosis not present

## 2014-12-26 DIAGNOSIS — I4891 Unspecified atrial fibrillation: Secondary | ICD-10-CM | POA: Insufficient documentation

## 2014-12-26 DIAGNOSIS — Z7901 Long term (current) use of anticoagulants: Secondary | ICD-10-CM | POA: Insufficient documentation

## 2014-12-26 DIAGNOSIS — I2510 Atherosclerotic heart disease of native coronary artery without angina pectoris: Secondary | ICD-10-CM | POA: Insufficient documentation

## 2014-12-26 LAB — URINALYSIS, ROUTINE W REFLEX MICROSCOPIC
Bilirubin Urine: NEGATIVE
Glucose, UA: NEGATIVE mg/dL
Ketones, ur: NEGATIVE mg/dL
Nitrite: NEGATIVE
PH: 7.5 (ref 5.0–8.0)
PROTEIN: NEGATIVE mg/dL
Specific Gravity, Urine: 1.015 (ref 1.005–1.030)
UROBILINOGEN UA: 0.2 mg/dL (ref 0.0–1.0)

## 2014-12-26 LAB — COMPREHENSIVE METABOLIC PANEL
ALBUMIN: 3.9 g/dL (ref 3.5–5.2)
ALK PHOS: 85 U/L (ref 39–117)
ALT: 12 U/L (ref 0–35)
AST: 21 U/L (ref 0–37)
Anion gap: 7 (ref 5–15)
BUN: 21 mg/dL (ref 6–23)
CHLORIDE: 99 mmol/L (ref 96–112)
CO2: 31 mmol/L (ref 19–32)
Calcium: 8.9 mg/dL (ref 8.4–10.5)
Creatinine, Ser: 0.86 mg/dL (ref 0.50–1.10)
GFR calc non Af Amer: 58 mL/min — ABNORMAL LOW (ref >90)
GFR, EST AFRICAN AMERICAN: 67 mL/min — AB (ref >90)
GLUCOSE: 191 mg/dL — AB (ref 70–99)
POTASSIUM: 3.9 mmol/L (ref 3.5–5.1)
Sodium: 137 mmol/L (ref 135–145)
TOTAL PROTEIN: 7.4 g/dL (ref 6.0–8.3)
Total Bilirubin: 0.6 mg/dL (ref 0.3–1.2)

## 2014-12-26 LAB — URINE MICROSCOPIC-ADD ON

## 2014-12-26 LAB — CBC
HCT: 35.1 % — ABNORMAL LOW (ref 36.0–46.0)
Hemoglobin: 11.5 g/dL — ABNORMAL LOW (ref 12.0–15.0)
MCH: 29.9 pg (ref 26.0–34.0)
MCHC: 32.8 g/dL (ref 30.0–36.0)
MCV: 91.4 fL (ref 78.0–100.0)
Platelets: 220 10*3/uL (ref 150–400)
RBC: 3.84 MIL/uL — ABNORMAL LOW (ref 3.87–5.11)
RDW: 15.1 % (ref 11.5–15.5)
WBC: 9.3 10*3/uL (ref 4.0–10.5)

## 2014-12-26 MED ORDER — ALPRAZOLAM 0.5 MG PO TABS: 0.5000 mg | ORAL_TABLET | Freq: Two times a day (BID) | ORAL | Status: DC | PRN

## 2014-12-26 NOTE — ED Provider Notes (Signed)
CSN: [id number]     Arrival date & time 12/26/14  1130 History  This chart was scribed for Tanna Furry, MD by Evelene Croon, ED Scribe. This patient was seen in room APA02/APA02 and the patient's care was started 12:36 PM.     Chief Complaint  Patient presents with  . Altered Mental Status   Level 5 caveat due to mental status    The history is provided by the patient, the EMS personnel, medical records and a caregiver. No language interpreter was used.     HPI Comments:  Alicia Mason is a 81 y.o. female brought in by [facility] a h/o dementia  who presents to the Emergency Department from home.caregiver arrives. She states that about a month ago the patient was taken off of Ativan. She states that she's had difficulty since then. She states she gets easily anxious. She states "even after she has a bowel movement more than once per per day she gets anxious".  Caregiver notes pt had 3 bowel movements this am and called EMS because she was upset  At this time pt denies pain of any kind.  Caregiver reports trouble sleeping for about 1 month as well. Pt off of her ativan for over one month.     Past Medical History  Diagnosis Date  . Essential hypertension, benign   . Type 2 diabetes mellitus   . Coronary atherosclerosis of native coronary artery 1999    CABG LIMA to LAD, SVG to OM1 and OM2 and SVG to PDA in 1999,   . Closed fracture of right humerus     Managed conservatively 2011  . Cerebrovascular disease   . Seasonal allergies   . Arthritis   . Vertigo   . Ischemic cardiomyopathy     45-50% 07/2013/ EF 40% per Echo 06/2011.  Marland Kitchen Near syncope   . NSTEMI (non-ST elevated myocardial infarction)     10/02/2014 & 06/2011.  . Diabetes mellitus   . Acute ischemic stroke 08/04/2013    Right MCA stroke  . Atrial fibrillation 05/28/2009    Paroxysmal    . Dyslipidemia   . Chronic anxiety   . Acute on chronic combined systolic and diastolic heart failure 09/13/8526    EF 40-45%; grade 2  diastolic dysfunction  . Dementia    Past Surgical History  Procedure Laterality Date  . Coronary artery bypass graft  1999    LIMA to LAD, SVG to OM1 and OM2, SVG to PDA  . Cataract surgery    . Abdominal hysterectomy      WITH BSO.  . Orif hip fracture Left 05/30/2014    Procedure: COMPRESSION HIP SCREW LEFT HIP;  Surgeon: Sanjuana Kava, MD;  Location: [facility];  Service: Orthopedics;  Laterality: Left;   Family History  Problem Relation Age of Onset  . CAD Sister     Died with MI age 73  . Pneumonia Mother   . Arthritis Other   . Coronary artery disease Other   . Diabetes Other   . Cancer Other    History  Substance Use Topics  . Smoking status: Never Smoker   . Smokeless tobacco: Never Used  . Alcohol Use: No   OB History    No data available     Review of Systems  Constitutional: Negative for fever, chills, diaphoresis, appetite change and fatigue.  HENT: Negative for mouth sores, sore throat and trouble swallowing.   Eyes: Negative for visual disturbance.  Respiratory: Negative for cough,  chest tightness, shortness of breath and wheezing.   Cardiovascular: Negative for chest pain.  Gastrointestinal: Negative for nausea, vomiting, abdominal pain, diarrhea and abdominal distention.  Endocrine: Negative for polydipsia, polyphagia and polyuria.  Genitourinary: Negative for dysuria, frequency and hematuria.  Musculoskeletal: Negative for gait problem.  Skin: Negative for color change, pallor and rash.  Neurological: Negative for dizziness, syncope, light-headedness and headaches.       Patient is able to follow conversation was simple questioning is able to give details of her history. She is able to describe her home situation to me. Upon arrival her care giver her history is actually quite accurate.  Hematological: Does not bruise/bleed easily.  Psychiatric/Behavioral: Negative for behavioral problems and confusion.      Allergies  Cephalexin  Home Medications    Prior to Admission medications   Medication Sig Start Date End Date Taking? Authorizing Provider  acetaminophen-codeine (TYLENOL #3) 300-30 MG per tablet Take one tablet by mouth half hour prior to therapy and every 4 hours as needed for pain 07/02/14  Yes Lauree Chandler, NP  furosemide (LASIX) 20 MG tablet Take 1 tablet (20 mg total) by mouth 2 (two) times daily. For treatment of heart failure. 10/05/14  Yes Rexene Alberts, MD  insulin glargine (LANTUS) 100 UNIT/ML injection Inject 10 Units into the skin at bedtime.   Yes Historical Provider, MD  metoprolol (LOPRESSOR) 50 MG tablet Take 1 tablet (50 mg total) by mouth 2 (two) times daily. 06/01/14  Yes Nita Sells, MD  mirabegron ER (MYRBETRIQ) 25 MG TB24 tablet Take 25 mg by mouth daily.   Yes Historical Provider, MD  potassium chloride (K-DUR) 10 MEQ tablet Take 1 tablet (10 mEq total) by mouth 2 (two) times daily. Potassium. Take each tablet with furosemide twice daily. 10/05/14  Yes Rexene Alberts, MD  potassium chloride (MICRO-K) 10 MEQ CR capsule Take 10 mEq by mouth every other day.   Yes Historical Provider, MD  pravastatin (PRAVACHOL) 10 MG tablet Take 10 mg by mouth every morning.    Yes Historical Provider, MD  sitaGLIPtin (JANUVIA) 50 MG tablet Take 50 mg by mouth every morning.   Yes Historical Provider, MD  ALPRAZolam Duanne Moron) 0.5 MG tablet Take 1 tablet (0.5 mg total) by mouth 2 (two) times daily as needed for anxiety or sleep. 12/26/14   Tanna Furry, MD  bisacodyl (DULCOLAX) 10 MG suppository Place 1 suppository (10 mg total) rectally daily as needed for moderate constipation. Patient not taking: Reported on 12/26/2014 06/01/14   Nita Sells, MD  famotidine (PEPCID) 20 MG tablet Take 1 tablet (20 mg total) by mouth daily. Can buy over-the-counter Patient not taking: Reported on 12/26/2014 10/05/14   Rexene Alberts, MD  Rivaroxaban (XARELTO) 15 MG TABS tablet Take 1 tablet (15 mg total) by mouth daily before  breakfast. Patient not taking: Reported on 12/26/2014 08/07/13   Rexene Alberts, MD  senna (SENOKOT) 8.6 MG TABS tablet Take 1 tablet (8.6 mg total) by mouth 2 (two) times daily. Patient not taking: Reported on 12/26/2014 06/01/14   Nita Sells, MD   BP 148/68 mmHg  Pulse 74  Temp(Src) 98.1 F (36.7 C) (Oral)  Resp 15  SpO2 99% Physical Exam  Constitutional: She appears well-developed and well-nourished. No distress.  HENT:  Head: Normocephalic.  Eyes: Conjunctivae are normal. Pupils are equal, round, and reactive to light. No scleral icterus.  Neck: Normal range of motion. Neck supple. No thyromegaly present.  Cardiovascular: Normal rate and regular rhythm.  Exam  reveals no gallop and no friction rub.   No murmur heard. Pulmonary/Chest: Effort normal and breath sounds normal. No respiratory distress. She has no wheezes. She has no rales.  Abdominal: Soft. Bowel sounds are normal. She exhibits no distension. There is no tenderness. There is no rebound.  Musculoskeletal: Normal range of motion.  Neurological: She is alert.  Able to answer simple questioning  No focal abnormalities  Skin: Skin is warm and dry. No rash noted.  Psychiatric: She has a normal mood and affect. Her behavior is normal.  Nursing note and vitals reviewed.   ED Course  Procedures   DIAGNOSTIC STUDIES:  Oxygen Saturation is 100% on RA, normal by my interpretation.    COORDINATION OF CARE:  12:43 PM Will order labDiscussed treatment plan with pt and caregiver at bedside and pt agreed to plan.  Labs Review Labs Reviewed  CBC - Abnormal; Notable for the following:    RBC 3.84 (*)    Hemoglobin 11.5 (*)    HCT 35.1 (*)    All other components within normal limits  COMPREHENSIVE METABOLIC PANEL - Abnormal; Notable for the following:    Glucose, Bld 191 (*)    GFR calc non Af Amer 58 (*)    GFR calc Af Amer 67 (*)    All other components within normal limits  URINALYSIS, ROUTINE W REFLEX  MICROSCOPIC - Abnormal; Notable for the following:    Hgb urine dipstick TRACE (*)    Leukocytes, UA SMALL (*)    All other components within normal limits  URINE MICROSCOPIC-ADD ON - Abnormal; Notable for the following:    Bacteria, UA FEW (*)    All other components within normal limits  URINE CULTURE    Imaging Review Ct Head Wo Contrast  12/26/2014   CLINICAL DATA:  Altered mental status  EXAM: CT HEAD WITHOUT CONTRAST  TECHNIQUE: Contiguous axial images were obtained from the base of the skull through the vertex without intravenous contrast.  COMPARISON:  05/28/2014  FINDINGS: Oval 1.9 cm left cerebellopontine angle mass measures slightly smaller than previously, with imaging features most typical for a meningioma. Stable degree of diffuse cortical volume loss with proportional ventricular prominence. Periventricular white matter hypodensity is noted with remote right external capsule lacunar infarct image 16. No acute hemorrhage, infarct, or mass lesion is identified. Moderate right maxillary sinusitis noted. No skull fracture. Linear streak artifact is noted over the left parietal convexity image 19, versus small cystic hygroma, unchanged.  IMPRESSION: No new acute intracranial finding. Stable chronic findings as above.   Electronically Signed   By: Conchita Paris M.D.   On: 12/26/2014 16:23     EKG Interpretation   Date/Time:  Saturday December 26 2014 11:47:01 EST Ventricular Rate:  66 PR Interval:  149 QRS Duration: 144 QT Interval:  463 QTC Calculation: 485 R Axis:   -44 Text Interpretation:  Sinus rhythm Left bundle branch block Confirmed by  Jeneen Rinks  MD, Ririe ([id number]) on 12/26/2014 3:03:43 PM      MDM   Final diagnoses:  Confusion  Anxiety    No Urinary tract infection. No metabolic abnormalities. CT scan is pending. If this proves negative I think she would be appropriate for discharge home with a limited number of low-dose of her Ativan to take as needed for sleep or  for episodes like today where she becomes anxious.  I ask that she  follow with her primary care physician this week.  I personally performed the services  described in this documentation, which was scribed in my presence. The recorded information has been reviewed and is accurate.    Tanna Furry, MD 12/27/14 [phone]

## 2014-12-26 NOTE — ED Notes (Signed)
Patient with no complaints at this time. Respirations even and unlabored. Skin warm/dry. Discharge instructions reviewed with patient/family at this time. Patient/family given opportunity to voice concerns/ask questions. IV removed per policy and band-aid applied to site. Patient discharged at this time and left Emergency Department via wheelchair with family.

## 2014-12-26 NOTE — Discharge Instructions (Signed)
Panic Attacks °Panic attacks are sudden, short feelings of great fear or discomfort. You may have them for no reason when you are relaxed, when you are uneasy (anxious), or when you are sleeping.  °HOME CARE °· Take all your medicines as told. °· Check with your doctor before starting new medicines. °· Keep all doctor visits. °GET HELP IF: °· You are not able to take your medicines as told. °· Your symptoms do not get better. °· Your symptoms get worse. °GET HELP RIGHT AWAY IF: °· Your attacks seem different than your normal attacks. °· You have thoughts about hurting yourself or others. °· You take panic attack medicine and you have a side effect. °MAKE SURE YOU: °· Understand these instructions. °· Will watch your condition. °· Will get help right away if you are not doing well or get worse. °Document Released: 12/09/2010 Document Revised: 08/27/2013 Document Reviewed: 06/20/2013 °Exit[employer]® Patient Information ©2015 [employer]. This information is not intended to replace advice given to you by your health care provider. Make sure you discuss any questions you have with your health care provider. ° °
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
## 2014-12-26 NOTE — ED Notes (Signed)
Patient arrives via EMS from home with family stating she is "not acting right" today, unknown time. Patient with h/o dementia. Family and EMS report paranoid behavior that she has made statements that someone is out to get her or hurt her. Unknown if this is new or just worse today. Family not present at this time. Patient with some cash in an envelope in her adult brief that she stated was for "just in case it's needed" Patient alert at present.

## 2014-12-26 NOTE — ED Notes (Addendum)
Pt has money envelope  stored in her diaper.  Sister at bedside.  Money envelope  given to sister.

## 2014-12-29 LAB — URINE CULTURE
Colony Count: NO GROWTH
Culture: NO GROWTH

## 2015-01-06 ENCOUNTER — Other Ambulatory Visit: Payer: Self-pay | Admitting: Internal Medicine

## 2015-03-30 ENCOUNTER — Institutional Professional Consult (permissible substitution): Payer: Medicare Other | Admitting: Neurology

## 2015-04-13 ENCOUNTER — Other Ambulatory Visit (HOSPITAL_COMMUNITY): Payer: Self-pay | Admitting: Family Medicine

## 2015-04-13 ENCOUNTER — Ambulatory Visit (HOSPITAL_COMMUNITY)
Admission: RE | Admit: 2015-04-13 | Discharge: 2015-04-13 | Disposition: A | Discharge status: Home / Self Care | Payer: Medicare Other | Source: Ambulatory Visit | Attending: Family Medicine | Admitting: Family Medicine

## 2015-04-13 DIAGNOSIS — M545 Low back pain: Secondary | ICD-10-CM | POA: Insufficient documentation

## 2015-04-13 DIAGNOSIS — M5489 Other dorsalgia: Secondary | ICD-10-CM | POA: Diagnosis present

## 2015-04-13 DIAGNOSIS — W06XXXA Fall from bed, initial encounter: Secondary | ICD-10-CM | POA: Insufficient documentation

## 2015-04-13 DIAGNOSIS — M8580 Other specified disorders of bone density and structure, unspecified site: Secondary | ICD-10-CM | POA: Insufficient documentation

## 2015-05-14 ENCOUNTER — Encounter (HOSPITAL_COMMUNITY): Payer: Self-pay | Admitting: Emergency Medicine

## 2015-05-14 ENCOUNTER — Emergency Department (HOSPITAL_COMMUNITY): Payer: Medicare Other

## 2015-05-14 ENCOUNTER — Emergency Department (HOSPITAL_COMMUNITY)
Admission: EM | Admit: 2015-05-14 | Discharge: 2015-05-14 | Disposition: A | Discharge status: Home / Self Care | Payer: Medicare Other | Attending: Emergency Medicine | Admitting: Emergency Medicine

## 2015-05-14 DIAGNOSIS — Z79899 Other long term (current) drug therapy: Secondary | ICD-10-CM | POA: Diagnosis not present

## 2015-05-14 DIAGNOSIS — F0390 Unspecified dementia without behavioral disturbance: Secondary | ICD-10-CM | POA: Diagnosis not present

## 2015-05-14 DIAGNOSIS — E785 Hyperlipidemia, unspecified: Secondary | ICD-10-CM | POA: Insufficient documentation

## 2015-05-14 DIAGNOSIS — Z794 Long term (current) use of insulin: Secondary | ICD-10-CM | POA: Insufficient documentation

## 2015-05-14 DIAGNOSIS — Z951 Presence of aortocoronary bypass graft: Secondary | ICD-10-CM | POA: Insufficient documentation

## 2015-05-14 DIAGNOSIS — N390 Urinary tract infection, site not specified: Secondary | ICD-10-CM | POA: Diagnosis not present

## 2015-05-14 DIAGNOSIS — Z8673 Personal history of transient ischemic attack (TIA), and cerebral infarction without residual deficits: Secondary | ICD-10-CM | POA: Diagnosis not present

## 2015-05-14 DIAGNOSIS — I252 Old myocardial infarction: Secondary | ICD-10-CM | POA: Diagnosis not present

## 2015-05-14 DIAGNOSIS — Z90710 Acquired absence of both cervix and uterus: Secondary | ICD-10-CM | POA: Diagnosis not present

## 2015-05-14 DIAGNOSIS — I2510 Atherosclerotic heart disease of native coronary artery without angina pectoris: Secondary | ICD-10-CM | POA: Insufficient documentation

## 2015-05-14 DIAGNOSIS — M1990 Unspecified osteoarthritis, unspecified site: Secondary | ICD-10-CM | POA: Insufficient documentation

## 2015-05-14 DIAGNOSIS — I480 Paroxysmal atrial fibrillation: Secondary | ICD-10-CM | POA: Diagnosis not present

## 2015-05-14 DIAGNOSIS — R079 Chest pain, unspecified: Secondary | ICD-10-CM | POA: Insufficient documentation

## 2015-05-14 DIAGNOSIS — I5043 Acute on chronic combined systolic (congestive) and diastolic (congestive) heart failure: Secondary | ICD-10-CM | POA: Diagnosis not present

## 2015-05-14 DIAGNOSIS — R197 Diarrhea, unspecified: Secondary | ICD-10-CM | POA: Diagnosis present

## 2015-05-14 DIAGNOSIS — Z792 Long term (current) use of antibiotics: Secondary | ICD-10-CM | POA: Insufficient documentation

## 2015-05-14 DIAGNOSIS — I10 Essential (primary) hypertension: Secondary | ICD-10-CM | POA: Insufficient documentation

## 2015-05-14 DIAGNOSIS — F419 Anxiety disorder, unspecified: Secondary | ICD-10-CM | POA: Insufficient documentation

## 2015-05-14 DIAGNOSIS — E119 Type 2 diabetes mellitus without complications: Secondary | ICD-10-CM | POA: Diagnosis not present

## 2015-05-14 LAB — CBC
HEMATOCRIT: 33.7 % — AB (ref 36.0–46.0)
HEMOGLOBIN: 11.3 g/dL — AB (ref 12.0–15.0)
MCH: 31.1 pg (ref 26.0–34.0)
MCHC: 33.5 g/dL (ref 30.0–36.0)
MCV: 92.8 fL (ref 78.0–100.0)
Platelets: 198 10*3/uL (ref 150–400)
RBC: 3.63 MIL/uL — ABNORMAL LOW (ref 3.87–5.11)
RDW: 12.9 % (ref 11.5–15.5)
WBC: 7.4 10*3/uL (ref 4.0–10.5)

## 2015-05-14 LAB — URINALYSIS, ROUTINE W REFLEX MICROSCOPIC
BILIRUBIN URINE: NEGATIVE
Glucose, UA: NEGATIVE mg/dL
Ketones, ur: NEGATIVE mg/dL
Nitrite: NEGATIVE
Protein, ur: NEGATIVE mg/dL
SPECIFIC GRAVITY, URINE: 1.025 (ref 1.005–1.030)
Urobilinogen, UA: 0.2 mg/dL (ref 0.0–1.0)
pH: 5.5 (ref 5.0–8.0)

## 2015-05-14 LAB — HEPATIC FUNCTION PANEL
ALT: 15 U/L (ref 14–54)
AST: 22 U/L (ref 15–41)
Albumin: 3.9 g/dL (ref 3.5–5.0)
Alkaline Phosphatase: 71 U/L (ref 38–126)
BILIRUBIN DIRECT: 0.1 mg/dL (ref 0.1–0.5)
BILIRUBIN TOTAL: 0.9 mg/dL (ref 0.3–1.2)
Indirect Bilirubin: 0.8 mg/dL (ref 0.3–0.9)
Total Protein: 6.6 g/dL (ref 6.5–8.1)

## 2015-05-14 LAB — TROPONIN I: Troponin I: <0.03 ng/mL (ref <0.031)

## 2015-05-14 LAB — BASIC METABOLIC PANEL
Anion gap: 8 (ref 5–15)
BUN: 21 mg/dL — AB (ref 6–20)
CALCIUM: 8.8 mg/dL — AB (ref 8.9–10.3)
CO2: 30 mmol/L (ref 22–32)
CREATININE: 0.87 mg/dL (ref 0.44–1.00)
Chloride: 101 mmol/L (ref 101–111)
GFR, EST NON AFRICAN AMERICAN: 57 mL/min — AB (ref >60)
Glucose, Bld: 152 mg/dL — ABNORMAL HIGH (ref 65–99)
Potassium: 4.4 mmol/L (ref 3.5–5.1)
Sodium: 139 mmol/L (ref 135–145)

## 2015-05-14 LAB — POC OCCULT BLOOD, ED: Fecal Occult Bld: POSITIVE — AB

## 2015-05-14 LAB — URINE MICROSCOPIC-ADD ON

## 2015-05-14 LAB — LIPASE, BLOOD: Lipase: 41 U/L (ref 22–51)

## 2015-05-14 MED ORDER — LEVOFLOXACIN 500 MG PO TABS: 500.0000 mg | ORAL_TABLET | Freq: Every day | ORAL | Status: DC

## 2015-05-14 MED ORDER — METRONIDAZOLE 500 MG PO TABS: 500.0000 mg | ORAL_TABLET | Freq: Three times a day (TID) | ORAL | Status: DC

## 2015-05-14 NOTE — ED Notes (Signed)
Notified Dr. Betsey Holiday that patient is questioning her status and when she will be able to leave.

## 2015-05-14 NOTE — ED Notes (Signed)
Per pt care aid, pt was diagnosed with C-Diff in 2005. Pt aide reports pt only has diarrhea after eating. Pt denies having to void at this time.

## 2015-05-14 NOTE — ED Notes (Addendum)
Patient complaining of epigastric pain and diarrhea x 4 days. Caregiver states patient has history of c-diff.

## 2015-05-14 NOTE — ED Provider Notes (Signed)
CSN: [id number]     Arrival date & time 05/14/15  1038 History  This chart was scribed for Alicia Greek, MD by Irene Pap, ED Scribe. This patient was seen in room APA06/APA06 and patient care was started at 10:51 AM.   Chief Complaint  Patient presents with  . Chest Pain  . Diarrhea   The history is provided by a caregiver. No language interpreter was used.  HPI Comments: Alicia Mason is a 81 y.o. Female with a history of C-Diff who presents to the Emergency Department complaining of central chest pain and loose stools onset 4 days ago. Caregiver states that the pt did not take her C-diff medication today because she believes it is contributing to her symptoms. She reports chest pain that feels like heartburn, which she has a history of; reports taking Pepcid to no relief. She denies vomiting or current chest pain.    Past Medical History  Diagnosis Date  . Essential hypertension, benign   . Type 2 diabetes mellitus   . Coronary atherosclerosis of native coronary artery 1999    CABG LIMA to LAD, SVG to OM1 and OM2 and SVG to PDA in 1999,   . Closed fracture of right humerus     Managed conservatively 2011  . Cerebrovascular disease   . Seasonal allergies   . Arthritis   . Vertigo   . Ischemic cardiomyopathy     45-50% 07/2013/ EF 40% per Echo 06/2011.  Marland Kitchen Near syncope   . NSTEMI (non-ST elevated myocardial infarction)     10/02/2014 & 06/2011.  . Diabetes mellitus   . Acute ischemic stroke 08/04/2013    Right MCA stroke  . Atrial fibrillation 05/28/2009    Paroxysmal    . Dyslipidemia   . Chronic anxiety   . Acute on chronic combined systolic and diastolic heart failure 96/28/3662    EF 94-76%; grade 2 diastolic dysfunction  . Dementia    Past Surgical History  Procedure Laterality Date  . Coronary artery bypass graft  1999    LIMA to LAD, SVG to OM1 and OM2, SVG to PDA  . Cataract surgery    . Abdominal hysterectomy      WITH BSO.  . Orif hip fracture Left  05/30/2014    Procedure: COMPRESSION HIP SCREW LEFT HIP;  Surgeon: Sanjuana Kava, MD;  Location: [facility];  Service: Orthopedics;  Laterality: Left;   Family History  Problem Relation Age of Onset  . CAD Sister     Died with MI age 9  . Pneumonia Mother   . Arthritis Other   . Coronary artery disease Other   . Diabetes Other   . Cancer Other    History  Substance Use Topics  . Smoking status: Never Smoker   . Smokeless tobacco: Never Used  . Alcohol Use: No   OB History    No data available     Review of Systems  Cardiovascular: Positive for chest pain.  Gastrointestinal: Negative for vomiting.  All other systems reviewed and are negative.  Allergies  Cephalexin  Home Medications   Prior to Admission medications   Medication Sig Start Date End Date Taking? Authorizing Provider  acetaminophen-codeine (TYLENOL #3) 300-30 MG per tablet Take one tablet by mouth half hour prior to therapy and every 4 hours as needed for pain 07/02/14   Lauree Chandler, NP  ALPRAZolam Duanne Moron) 0.5 MG tablet Take 1 tablet (0.5 mg total) by mouth 2 (two) times daily as  needed for anxiety or sleep. 12/26/14   Tanna Furry, MD  bisacodyl (DULCOLAX) 10 MG suppository Place 1 suppository (10 mg total) rectally daily as needed for moderate constipation. Patient not taking: Reported on 12/26/2014 06/01/14   Nita Sells, MD  cetirizine (ZYRTEC) 10 MG tablet Take 10 mg by mouth daily.    Historical Provider, MD  donepezil (ARICEPT) 5 MG tablet Take 5 mg by mouth at bedtime.    Historical Provider, MD  famotidine (PEPCID) 20 MG tablet Take 1 tablet (20 mg total) by mouth daily. Can buy over-the-counter Patient not taking: Reported on 12/26/2014 10/05/14   Rexene Alberts, MD  furosemide (LASIX) 20 MG tablet Take 1 tablet (20 mg total) by mouth 2 (two) times daily. For treatment of heart failure. 10/05/14   Rexene Alberts, MD  insulin glargine (LANTUS) 100 UNIT/ML injection Inject 10 Units into the skin at  bedtime.    Historical Provider, MD  levofloxacin (LEVAQUIN) 500 MG tablet Take 1 tablet (500 mg total) by mouth daily. 05/14/15   Alicia Greek, MD  Memantine HCl ER 21 MG CP24 Take by mouth.    Historical Provider, MD  metoprolol (LOPRESSOR) 50 MG tablet Take 1 tablet (50 mg total) by mouth 2 (two) times daily. 06/01/14   Nita Sells, MD  metroNIDAZOLE (FLAGYL) 500 MG tablet Take 1 tablet (500 mg total) by mouth 3 (three) times daily. 05/14/15   Alicia Greek, MD  mirabegron ER (MYRBETRIQ) 25 MG TB24 tablet Take 25 mg by mouth daily.    Historical Provider, MD  potassium chloride (K-DUR) 10 MEQ tablet Take 1 tablet (10 mEq total) by mouth 2 (two) times daily. Potassium. Take each tablet with furosemide twice daily. 10/05/14   Rexene Alberts, MD  potassium chloride (MICRO-K) 10 MEQ CR capsule Take 10 mEq by mouth every other day.    Historical Provider, MD  pravastatin (PRAVACHOL) 10 MG tablet Take 10 mg by mouth every morning.     Historical Provider, MD  QUEtiapine (SEROQUEL) 50 MG tablet Take 50 mg by mouth 2 (two) times daily.    Historical Provider, MD  Rivaroxaban (XARELTO) 15 MG TABS tablet Take 1 tablet (15 mg total) by mouth daily before breakfast. 08/07/13   Rexene Alberts, MD  sitaGLIPtin (JANUVIA) 50 MG tablet Take 50 mg by mouth every morning.    Historical Provider, MD   BP 170/66 mmHg  Pulse 64  Temp(Src) 99.1 F (37.3 C) (Oral)  Resp 22  Ht 5\' 5"  (1.651 m)  Wt 114 lb (51.71 kg)  BMI 18.97 kg/m2  SpO2 100%   Physical Exam  Constitutional: She is oriented to person, place, and time. She appears well-developed and well-nourished. No distress.  HENT:  Head: Normocephalic and atraumatic.  Right Ear: Hearing normal.  Left Ear: Hearing normal.  Nose: Nose normal.  Mouth/Throat: Oropharynx is clear and moist and mucous membranes are normal.  Eyes: Conjunctivae and EOM are normal. Pupils are equal, round, and reactive to light.  Neck: Normal range of motion.  Neck supple.  Cardiovascular: Regular rhythm, S1 normal and S2 normal.  Exam reveals no gallop and no friction rub.   No murmur heard. Pulmonary/Chest: Effort normal and breath sounds normal. No respiratory distress. She exhibits no tenderness.  Abdominal: Soft. Normal appearance and bowel sounds are normal. There is no hepatosplenomegaly. There is no tenderness. There is no rebound, no guarding, no tenderness at McBurney's point and negative Murphy's sign. No hernia.  Musculoskeletal: Normal range of motion.  Neurological: She is alert and oriented to person, place, and time. She has normal strength. No cranial nerve deficit or sensory deficit. Coordination normal. GCS eye subscore is 4. GCS verbal subscore is 5. GCS motor subscore is 6.  Skin: Skin is warm, dry and intact. No rash noted. No cyanosis.  Psychiatric: She has a normal mood and affect. Her speech is normal and behavior is normal. Thought content normal.  Nursing note and vitals reviewed.   ED Course  Procedures (including critical care time) DIAGNOSTIC STUDIES: Oxygen Saturation is 100% on RA, normal by my interpretation.    COORDINATION OF CARE: 10:54 AM-Discussed treatment plan which includes labs and imaging with pt at bedside and pt agreed to plan.   Labs Review Labs Reviewed  CBC - Abnormal; Notable for the following:    RBC 3.63 (*)    Hemoglobin 11.3 (*)    HCT 33.7 (*)    All other components within normal limits  BASIC METABOLIC PANEL - Abnormal; Notable for the following:    Glucose, Bld 152 (*)    BUN 21 (*)    Calcium 8.8 (*)    GFR calc non Af Amer 57 (*)    All other components within normal limits  URINALYSIS, ROUTINE W REFLEX MICROSCOPIC (NOT AT [facility]) - Abnormal; Notable for the following:    APPearance HAZY (*)    Hgb urine dipstick SMALL (*)    Leukocytes, UA LARGE (*)    All other components within normal limits  URINE MICROSCOPIC-ADD ON - Abnormal; Notable for the following:    Squamous  Epithelial / LPF FEW (*)    All other components within normal limits  POC OCCULT BLOOD, ED - Abnormal; Notable for the following:    Fecal Occult Bld POSITIVE (*)    All other components within normal limits  CLOSTRIDIUM DIFFICILE BY PCR (NOT AT [facility])  URINE CULTURE  TROPONIN I  HEPATIC FUNCTION PANEL  LIPASE, BLOOD    Imaging Review Dg Chest Port 1 View  05/14/2015   CLINICAL DATA:  81 year female with epigastric pain and diarrhea x4 days.  EXAM: PORTABLE CHEST - 1 VIEW  COMPARISON:  Chest radiograph dated 10/01/2014  FINDINGS: Single view of the chest demonstrate clear lungs. The cardiomediastinal silhouette is within normal limits. Median sternotomy wires and CABG surgical clips noted. There is degenerative changes of the right shoulder with an old right humeral fracture. Osseous structures are otherwise unremarkable for the patient's age.  IMPRESSION: No acute cardiopulmonary process.   Electronically Signed   By: Anner Crete M.D.   On: 05/14/2015 11:42     EKG Interpretation   Date/Time:  Friday May 14 2015 10:50:04 EDT Ventricular Rate:  63 PR Interval:  196 QRS Duration: 143 QT Interval:  471 QTC Calculation: 482 R Axis:   -46 Text Interpretation:  Sinus rhythm Left bundle branch block No significant  change since last tracing Confirmed by Kearney Evitt  MD, Olson Lucarelli [id number]) on  05/14/2015 11:40:16 AM      MDM   Final diagnoses:  Diarrhea  UTI (lower urinary tract infection)    Brought to the emergency department for evaluation of diarrhea. Patient reportedly has history of C. difficile colitis. She has not had frank diarrhea, simply "loose stools" according to caregiver. She has not had any movement or diarrhea here in the ER. Lab work was unremarkable. She complained of some "indigestion". Cardiac evaluation is negative. Patient's urinalysis suggest infection, culture pending. Will initiate treatment with Cipro.  Will add Flagyl because of the diarrhea and  history of C. difficile, recommend outpatient testing if diarrhea continues.  I personally performed the services described in this documentation, which was scribed in my presence. The recorded information has been reviewed and is accurate.     Alicia Greek, MD 05/14/15 [phone]

## 2015-05-14 NOTE — ED Notes (Addendum)
Pt given graham crackers and water per request. EDP reported pt to be discharged home.

## 2015-05-14 NOTE — Discharge Instructions (Signed)
Diarrhea °Diarrhea is frequent loose and watery bowel movements. It can cause you to feel weak and dehydrated. Dehydration can cause you to become tired and thirsty, have a dry mouth, and have decreased urination that often is dark yellow. Diarrhea is a sign of another problem, most often an infection that will not last long. In most cases, diarrhea typically lasts 2-3 days. However, it can last longer if it is a sign of something more serious. It is important to treat your diarrhea as directed by your caregiver to lessen or prevent future episodes of diarrhea. °CAUSES  °Some common causes include: °· Gastrointestinal infections caused by viruses, bacteria, or parasites. °· Food poisoning or food allergies. °· Certain medicines, such as antibiotics, chemotherapy, and laxatives. °· Artificial sweeteners and fructose. °· Digestive disorders. °HOME CARE INSTRUCTIONS °· Ensure adequate fluid intake (hydration): Have 1 cup (8 oz) of fluid for each diarrhea episode. Avoid fluids that contain simple sugars or sports drinks, fruit juices, whole milk products, and sodas. Your urine should be clear or pale yellow if you are drinking enough fluids. Hydrate with an oral rehydration solution that you can purchase at pharmacies, retail stores, and online. You can prepare an oral rehydration solution at home by mixing the following ingredients together: °¨  - tsp table salt. °¨ ¾ tsp baking soda. °¨  tsp salt substitute containing potassium chloride. °¨ 1  tablespoons sugar. °¨ 1 L (34 oz) of water. °· Certain foods and beverages may increase the speed at which food moves through the gastrointestinal (GI) tract. These foods and beverages should be avoided and include: °¨ Caffeinated and alcoholic beverages. °¨ High-fiber foods, such as raw fruits and vegetables, nuts, seeds, and whole grain breads and cereals. °¨ Foods and beverages sweetened with sugar alcohols, such as xylitol, sorbitol, and mannitol. °· Some foods may be well  tolerated and may help thicken stool including: °¨ Starchy foods, such as rice, toast, pasta, low-sugar cereal, oatmeal, grits, baked potatoes, crackers, and bagels. °¨ Bananas. °¨ Applesauce. °· Add probiotic-rich foods to help increase healthy bacteria in the GI tract, such as yogurt and fermented milk products. °· Wash your hands well after each diarrhea episode. °· Only take over-the-counter or prescription medicines as directed by your caregiver. °· Take a warm bath to relieve any burning or pain from frequent diarrhea episodes. °SEEK IMMEDIATE MEDICAL CARE IF:  °· You are unable to keep fluids down. °· You have persistent vomiting. °· You have blood in your stool, or your stools are black and tarry. °· You do not urinate in 6-8 hours, or there is only a small amount of very dark urine. °· You have abdominal pain that increases or localizes. °· You have weakness, dizziness, confusion, or light-headedness. °· You have a severe headache. °· Your diarrhea gets worse or does not get better. °· You have a fever or persistent symptoms for more than 2-3 days. °· You have a fever and your symptoms suddenly get worse. °MAKE SURE YOU:  °· Understand these instructions. °· Will watch your condition. °· Will get help right away if you are not doing well or get worse. °Document Released: 10/27/2002 Document Revised: 03/23/2014 Document Reviewed: 07/14/2012 °ExitCare® Patient Information ©2015 [employer]. This information is not intended to replace advice given to you by your health care provider. Make sure you discuss any questions you have with your health care provider. °Urinary Tract Infection °Urinary tract infections (UTIs) can develop anywhere along your urinary tract. Your urinary tract   is your body's drainage system for removing wastes and extra water. Your urinary tract includes two kidneys, two ureters, a bladder, and a urethra. Your kidneys are a pair of bean-shaped organs. Each kidney is about the size of  your fist. They are located below your ribs, one on each side of your spine. °CAUSES °Infections are caused by microbes, which are microscopic organisms, including fungi, viruses, and bacteria. These organisms are so small that they can only be seen through a microscope. Bacteria are the microbes that most commonly cause UTIs. °SYMPTOMS  °Symptoms of UTIs may vary by age and gender of the patient and by the location of the infection. Symptoms in young women typically include a frequent and intense urge to urinate and a painful, burning feeling in the bladder or urethra during urination. Older women and men are more likely to be tired, shaky, and weak and have muscle aches and abdominal pain. A fever may mean the infection is in your kidneys. Other symptoms of a kidney infection include pain in your back or sides below the ribs, nausea, and vomiting. °DIAGNOSIS °To diagnose a UTI, your caregiver will ask you about your symptoms. Your caregiver also will ask to provide a urine sample. The urine sample will be tested for bacteria and white blood cells. White blood cells are made by your body to help fight infection. °TREATMENT  °Typically, UTIs can be treated with medication. Because most UTIs are caused by a bacterial infection, they usually can be treated with the use of antibiotics. The choice of antibiotic and length of treatment depend on your symptoms and the type of bacteria causing your infection. °HOME CARE INSTRUCTIONS °· If you were prescribed antibiotics, take them exactly as your caregiver instructs you. Finish the medication even if you feel better after you have only taken some of the medication. °· Drink enough water and fluids to keep your urine clear or pale yellow. °· Avoid caffeine, tea, and carbonated beverages. They tend to irritate your bladder. °· Empty your bladder often. Avoid holding urine for long periods of time. °· Empty your bladder before and after sexual intercourse. °· After a bowel  movement, women should cleanse from front to back. Use each tissue only once. °SEEK MEDICAL CARE IF:  °· You have back pain. °· You develop a fever. °· Your symptoms do not begin to resolve within 3 days. °SEEK IMMEDIATE MEDICAL CARE IF:  °· You have severe back pain or lower abdominal pain. °· You develop chills. °· You have nausea or vomiting. °· You have continued burning or discomfort with urination. °MAKE SURE YOU:  °· Understand these instructions. °· Will watch your condition. °· Will get help right away if you are not doing well or get worse. °Document Released: 08/16/2005 Document Revised: 05/07/2012 Document Reviewed: 12/15/2011 °ExitCare® Patient Information ©2015 [employer]. This information is not intended to replace advice given to you by your health care provider. Make sure you discuss any questions you have with your health care provider. ° °

## 2015-05-14 NOTE — ED Notes (Signed)
EDP aware of hemoccult positive result. No new orders given. nad noted.

## 2015-05-18 LAB — URINE CULTURE

## 2015-05-19 ENCOUNTER — Telehealth (HOSPITAL_COMMUNITY): Payer: Self-pay

## 2015-05-19 NOTE — Telephone Encounter (Signed)
Post ED Visit - Positive Culture Follow-up  Culture report reviewed by antimicrobial stewardship pharmacist: []  Wes Dulaney, Pharm.D., BCPS [x]  Heide Guile, Pharm.D., BCPS []  Alycia Rossetti, Pharm.D., BCPS []  [place], [place].D., BCPS, AAHIVP []  Legrand Como, Pharm.D., BCPS, AAHIVP []  Isac Sarna, Pharm.D., BCPS  Positive urine culture Treated with levofloxacin, organism sensitive to the same and no further patient follow-up is required at this time.  Ileene Musa 05/19/2015, 5:19 PM

## 2015-06-29 NOTE — Patient Outreach (Signed)
[facility]) Care Management  06/29/2015  Alicia Mason [birth year] [id number]   Referral from [place] [facility], assigned Sherrin Daisy, RN to outreach.  Thanks, Ronnell Freshwater. [place], [place] Assistant Phone: [phone] Fax: [phone]

## 2015-07-05 ENCOUNTER — Encounter (HOSPITAL_COMMUNITY): Payer: Self-pay

## 2015-07-05 ENCOUNTER — Inpatient Hospital Stay (HOSPITAL_COMMUNITY)
Admission: EM | Admit: 2015-07-05 | Discharge: 2015-07-10 | DRG: 330 | Disposition: A | Discharge status: Home Health | Payer: Medicare Other | Attending: Internal Medicine | Admitting: Internal Medicine

## 2015-07-05 DIAGNOSIS — Z794 Long term (current) use of insulin: Secondary | ICD-10-CM

## 2015-07-05 DIAGNOSIS — R270 Ataxia, unspecified: Secondary | ICD-10-CM

## 2015-07-05 DIAGNOSIS — M1990 Unspecified osteoarthritis, unspecified site: Secondary | ICD-10-CM | POA: Diagnosis present

## 2015-07-05 DIAGNOSIS — I482 Chronic atrial fibrillation: Secondary | ICD-10-CM | POA: Diagnosis not present

## 2015-07-05 DIAGNOSIS — D649 Anemia, unspecified: Secondary | ICD-10-CM | POA: Diagnosis present

## 2015-07-05 DIAGNOSIS — R7989 Other specified abnormal findings of blood chemistry: Secondary | ICD-10-CM

## 2015-07-05 DIAGNOSIS — I252 Old myocardial infarction: Secondary | ICD-10-CM | POA: Diagnosis not present

## 2015-07-05 DIAGNOSIS — D72829 Elevated white blood cell count, unspecified: Secondary | ICD-10-CM | POA: Diagnosis present

## 2015-07-05 DIAGNOSIS — E119 Type 2 diabetes mellitus without complications: Secondary | ICD-10-CM | POA: Diagnosis not present

## 2015-07-05 DIAGNOSIS — K625 Hemorrhage of anus and rectum: Secondary | ICD-10-CM | POA: Diagnosis present

## 2015-07-05 DIAGNOSIS — I255 Ischemic cardiomyopathy: Secondary | ICD-10-CM

## 2015-07-05 DIAGNOSIS — I2510 Atherosclerotic heart disease of native coronary artery without angina pectoris: Secondary | ICD-10-CM | POA: Diagnosis present

## 2015-07-05 DIAGNOSIS — D6489 Other specified anemias: Secondary | ICD-10-CM

## 2015-07-05 DIAGNOSIS — Z66 Do not resuscitate: Secondary | ICD-10-CM | POA: Diagnosis present

## 2015-07-05 DIAGNOSIS — D120 Benign neoplasm of cecum: Secondary | ICD-10-CM | POA: Diagnosis present

## 2015-07-05 DIAGNOSIS — L309 Dermatitis, unspecified: Secondary | ICD-10-CM

## 2015-07-05 DIAGNOSIS — K6381 Dieulafoy lesion of intestine: Secondary | ICD-10-CM

## 2015-07-05 DIAGNOSIS — F419 Anxiety disorder, unspecified: Secondary | ICD-10-CM | POA: Diagnosis present

## 2015-07-05 DIAGNOSIS — I5022 Chronic systolic (congestive) heart failure: Secondary | ICD-10-CM | POA: Diagnosis present

## 2015-07-05 DIAGNOSIS — I679 Cerebrovascular disease, unspecified: Secondary | ICD-10-CM

## 2015-07-05 DIAGNOSIS — K922 Gastrointestinal hemorrhage, unspecified: Secondary | ICD-10-CM | POA: Diagnosis not present

## 2015-07-05 DIAGNOSIS — D500 Iron deficiency anemia secondary to blood loss (chronic): Secondary | ICD-10-CM | POA: Diagnosis not present

## 2015-07-05 DIAGNOSIS — G9340 Encephalopathy, unspecified: Secondary | ICD-10-CM

## 2015-07-05 DIAGNOSIS — K2970 Gastritis, unspecified, without bleeding: Secondary | ICD-10-CM | POA: Diagnosis present

## 2015-07-05 DIAGNOSIS — D128 Benign neoplasm of rectum: Secondary | ICD-10-CM | POA: Diagnosis not present

## 2015-07-05 DIAGNOSIS — I480 Paroxysmal atrial fibrillation: Secondary | ICD-10-CM

## 2015-07-05 DIAGNOSIS — D62 Acute posthemorrhagic anemia: Secondary | ICD-10-CM

## 2015-07-05 DIAGNOSIS — K311 Adult hypertrophic pyloric stenosis: Secondary | ICD-10-CM | POA: Diagnosis present

## 2015-07-05 DIAGNOSIS — Z7901 Long term (current) use of anticoagulants: Secondary | ICD-10-CM | POA: Diagnosis not present

## 2015-07-05 DIAGNOSIS — Z8249 Family history of ischemic heart disease and other diseases of the circulatory system: Secondary | ICD-10-CM

## 2015-07-05 DIAGNOSIS — D122 Benign neoplasm of ascending colon: Secondary | ICD-10-CM | POA: Diagnosis present

## 2015-07-05 DIAGNOSIS — N390 Urinary tract infection, site not specified: Secondary | ICD-10-CM

## 2015-07-05 DIAGNOSIS — E11649 Type 2 diabetes mellitus with hypoglycemia without coma: Secondary | ICD-10-CM | POA: Diagnosis present

## 2015-07-05 DIAGNOSIS — K222 Esophageal obstruction: Secondary | ICD-10-CM | POA: Diagnosis present

## 2015-07-05 DIAGNOSIS — E785 Hyperlipidemia, unspecified: Secondary | ICD-10-CM

## 2015-07-05 DIAGNOSIS — E43 Unspecified severe protein-calorie malnutrition: Secondary | ICD-10-CM

## 2015-07-05 DIAGNOSIS — I10 Essential (primary) hypertension: Secondary | ICD-10-CM | POA: Diagnosis present

## 2015-07-05 DIAGNOSIS — R001 Bradycardia, unspecified: Secondary | ICD-10-CM

## 2015-07-05 DIAGNOSIS — Z8673 Personal history of transient ischemic attack (TIA), and cerebral infarction without residual deficits: Secondary | ICD-10-CM | POA: Diagnosis not present

## 2015-07-05 DIAGNOSIS — F0390 Unspecified dementia without behavioral disturbance: Secondary | ICD-10-CM | POA: Diagnosis present

## 2015-07-05 DIAGNOSIS — K648 Other hemorrhoids: Secondary | ICD-10-CM | POA: Diagnosis present

## 2015-07-05 DIAGNOSIS — E162 Hypoglycemia, unspecified: Secondary | ICD-10-CM

## 2015-07-05 DIAGNOSIS — R55 Syncope and collapse: Secondary | ICD-10-CM

## 2015-07-05 DIAGNOSIS — K635 Polyp of colon: Secondary | ICD-10-CM | POA: Diagnosis not present

## 2015-07-05 DIAGNOSIS — Z951 Presence of aortocoronary bypass graft: Secondary | ICD-10-CM

## 2015-07-05 DIAGNOSIS — K921 Melena: Secondary | ICD-10-CM | POA: Diagnosis present

## 2015-07-05 DIAGNOSIS — E876 Hypokalemia: Secondary | ICD-10-CM

## 2015-07-05 DIAGNOSIS — R42 Dizziness and giddiness: Secondary | ICD-10-CM

## 2015-07-05 DIAGNOSIS — I214 Non-ST elevation (NSTEMI) myocardial infarction: Secondary | ICD-10-CM

## 2015-07-05 DIAGNOSIS — K639 Disease of intestine, unspecified: Secondary | ICD-10-CM | POA: Diagnosis not present

## 2015-07-05 DIAGNOSIS — T501X5A Adverse effect of loop [high-ceiling] diuretics, initial encounter: Secondary | ICD-10-CM | POA: Diagnosis present

## 2015-07-05 DIAGNOSIS — Z833 Family history of diabetes mellitus: Secondary | ICD-10-CM

## 2015-07-05 DIAGNOSIS — R410 Disorientation, unspecified: Secondary | ICD-10-CM

## 2015-07-05 DIAGNOSIS — I4891 Unspecified atrial fibrillation: Secondary | ICD-10-CM | POA: Diagnosis not present

## 2015-07-05 DIAGNOSIS — I5043 Acute on chronic combined systolic (congestive) and diastolic (congestive) heart failure: Secondary | ICD-10-CM

## 2015-07-05 DIAGNOSIS — I639 Cerebral infarction, unspecified: Secondary | ICD-10-CM

## 2015-07-05 HISTORY — DX: Polyp of colon: K63.5

## 2015-07-05 HISTORY — DX: Dieulafoy lesion of intestine: K63.81

## 2015-07-05 HISTORY — DX: Adult hypertrophic pyloric stenosis: K31.1

## 2015-07-05 LAB — CBG MONITORING, ED: GLUCOSE-CAPILLARY: 244 mg/dL — AB (ref 65–99)

## 2015-07-05 LAB — COMPREHENSIVE METABOLIC PANEL
ALT: 16 U/L (ref 14–54)
AST: 23 U/L (ref 15–41)
Albumin: 3.1 g/dL — ABNORMAL LOW (ref 3.5–5.0)
Alkaline Phosphatase: 59 U/L (ref 38–126)
Anion gap: 7 (ref 5–15)
BUN: 29 mg/dL — AB (ref 6–20)
CHLORIDE: 100 mmol/L — AB (ref 101–111)
CO2: 28 mmol/L (ref 22–32)
CREATININE: 1.07 mg/dL — AB (ref 0.44–1.00)
Calcium: 7.8 mg/dL — ABNORMAL LOW (ref 8.9–10.3)
GFR calc Af Amer: 51 mL/min — ABNORMAL LOW (ref >60)
GFR, EST NON AFRICAN AMERICAN: 44 mL/min — AB (ref >60)
Glucose, Bld: 258 mg/dL — ABNORMAL HIGH (ref 65–99)
Potassium: 3.9 mmol/L (ref 3.5–5.1)
Sodium: 135 mmol/L (ref 135–145)
Total Bilirubin: 0.5 mg/dL (ref 0.3–1.2)
Total Protein: 5.6 g/dL — ABNORMAL LOW (ref 6.5–8.1)

## 2015-07-05 LAB — CBC WITH DIFFERENTIAL/PLATELET
BASOS ABS: 0 10*3/uL (ref 0.0–0.1)
Basophils Relative: 0 % (ref 0–1)
Eosinophils Absolute: 0.1 10*3/uL (ref 0.0–0.7)
Eosinophils Relative: 1 % (ref 0–5)
HEMATOCRIT: 17 % — AB (ref 36.0–46.0)
HEMOGLOBIN: 5.7 g/dL — AB (ref 12.0–15.0)
LYMPHS PCT: 21 % (ref 12–46)
Lymphs Abs: 1.6 10*3/uL (ref 0.7–4.0)
MCH: 30.6 pg (ref 26.0–34.0)
MCHC: 33.5 g/dL (ref 30.0–36.0)
MCV: 91.4 fL (ref 78.0–100.0)
Monocytes Absolute: 0.9 10*3/uL (ref 0.1–1.0)
Monocytes Relative: 11 % (ref 3–12)
NEUTROS ABS: 5 10*3/uL (ref 1.7–7.7)
NEUTROS PCT: 66 % (ref 43–77)
Platelets: 188 10*3/uL (ref 150–400)
RBC: 1.86 MIL/uL — AB (ref 3.87–5.11)
RDW: 13.2 % (ref 11.5–15.5)
WBC: 7.6 10*3/uL (ref 4.0–10.5)

## 2015-07-05 LAB — PREPARE RBC (CROSSMATCH)

## 2015-07-05 LAB — GLUCOSE, CAPILLARY: GLUCOSE-CAPILLARY: 178 mg/dL — AB (ref 65–99)

## 2015-07-05 LAB — POC OCCULT BLOOD, ED: Fecal Occult Bld: POSITIVE — AB

## 2015-07-05 MED ORDER — ONDANSETRON HCL 4 MG PO TABS: 4.0000 mg | ORAL_TABLET | Freq: Four times a day (QID) | ORAL | Status: DC | PRN

## 2015-07-05 MED ORDER — DONEPEZIL HCL 5 MG PO TABS
10.0000 mg | ORAL_TABLET | Freq: Every day | ORAL | Status: DC
  Administered 2015-07-05 – 2015-07-09 (×5): 10 mg via ORAL
  Filled 2015-07-05 (×5): qty 2

## 2015-07-05 MED ORDER — SODIUM CHLORIDE 0.9 % IV SOLN
INTRAVENOUS | Status: DC
  Administered 2015-07-06: 1 mL via INTRAVENOUS
  Administered 2015-07-08: 12:00:00 via INTRAVENOUS

## 2015-07-05 MED ORDER — ALPRAZOLAM 0.5 MG PO TABS
0.5000 mg | ORAL_TABLET | Freq: Two times a day (BID) | ORAL | Status: DC | PRN
  Administered 2015-07-06 – 2015-07-08 (×2): 0.5 mg via ORAL
  Filled 2015-07-05 (×3): qty 1

## 2015-07-05 MED ORDER — INSULIN ASPART 100 UNIT/ML ~~LOC~~ SOLN: 0.0000 [IU] | Freq: Every day | SUBCUTANEOUS | Status: DC

## 2015-07-05 MED ORDER — PANTOPRAZOLE SODIUM 40 MG IV SOLR
40.0000 mg | INTRAVENOUS | Status: DC
Start: 2015-07-05 — End: 2015-07-07
  Administered 2015-07-05 – 2015-07-06 (×2): 40 mg via INTRAVENOUS
  Filled 2015-07-05 (×2): qty 40

## 2015-07-05 MED ORDER — MIRABEGRON ER 25 MG PO TB24
25.0000 mg | ORAL_TABLET | Freq: Every day | ORAL | Status: DC
  Administered 2015-07-06 – 2015-07-10 (×5): 25 mg via ORAL
  Filled 2015-07-05 (×5): qty 1

## 2015-07-05 MED ORDER — LORATADINE 10 MG PO TABS
10.0000 mg | ORAL_TABLET | Freq: Every day | ORAL | Status: DC
  Administered 2015-07-06 – 2015-07-10 (×5): 10 mg via ORAL
  Filled 2015-07-05 (×5): qty 1

## 2015-07-05 MED ORDER — INSULIN ASPART 100 UNIT/ML ~~LOC~~ SOLN
0.0000 [IU] | Freq: Three times a day (TID) | SUBCUTANEOUS | Status: DC
  Administered 2015-07-06 (×2): 2 [IU] via SUBCUTANEOUS

## 2015-07-05 MED ORDER — INSULIN GLARGINE 100 UNIT/ML ~~LOC~~ SOLN
15.0000 [IU] | Freq: Every morning | SUBCUTANEOUS | Status: DC
  Administered 2015-07-06: 15 [IU] via SUBCUTANEOUS
  Filled 2015-07-05 (×3): qty 0.15

## 2015-07-05 MED ORDER — ONDANSETRON HCL 4 MG/2ML IJ SOLN: 4.0000 mg | Freq: Four times a day (QID) | INTRAMUSCULAR | Status: DC | PRN

## 2015-07-05 MED ORDER — CIPROFLOXACIN HCL 250 MG PO TABS
250.0000 mg | ORAL_TABLET | Freq: Two times a day (BID) | ORAL | Status: DC
  Administered 2015-07-05 – 2015-07-06 (×2): 250 mg via ORAL
  Filled 2015-07-05 (×2): qty 1

## 2015-07-05 MED ORDER — PANTOPRAZOLE SODIUM 40 MG IV SOLR: 40.0000 mg | INTRAVENOUS | Status: DC

## 2015-07-05 MED ORDER — METOPROLOL TARTRATE 50 MG PO TABS
50.0000 mg | ORAL_TABLET | Freq: Two times a day (BID) | ORAL | Status: DC
  Administered 2015-07-05 – 2015-07-10 (×10): 50 mg via ORAL
  Filled 2015-07-05 (×11): qty 1

## 2015-07-05 MED ORDER — LINAGLIPTIN 5 MG PO TABS
5.0000 mg | ORAL_TABLET | Freq: Every day | ORAL | Status: DC
Start: 2015-07-06 — End: 2015-07-06
  Administered 2015-07-06: 5 mg via ORAL
  Filled 2015-07-05: qty 1

## 2015-07-05 MED ORDER — LORATADINE 10 MG PO TABS: 10.0000 mg | ORAL_TABLET | Freq: Every day | ORAL | Status: DC

## 2015-07-05 MED ORDER — SODIUM CHLORIDE 0.9 % IJ SOLN
3.0000 mL | Freq: Two times a day (BID) | INTRAMUSCULAR | Status: DC
  Administered 2015-07-05 – 2015-07-09 (×7): 3 mL via INTRAVENOUS

## 2015-07-05 MED ORDER — HYDROCODONE-ACETAMINOPHEN 5-325 MG PO TABS
1.0000 | ORAL_TABLET | Freq: Two times a day (BID) | ORAL | Status: DC
  Administered 2015-07-05 – 2015-07-10 (×10): 1 via ORAL
  Filled 2015-07-05 (×10): qty 1

## 2015-07-05 MED ORDER — PRAVASTATIN SODIUM 10 MG PO TABS
10.0000 mg | ORAL_TABLET | Freq: Every day | ORAL | Status: DC
  Administered 2015-07-05 – 2015-07-09 (×5): 10 mg via ORAL
  Filled 2015-07-05 (×5): qty 1

## 2015-07-05 NOTE — ED Notes (Addendum)
Pt. Picked up by [facility] at the [facility]. Staff reports dark blood on the pad appearing to come from her rectum x2days. Pt. Reports being treated recently by her primary care doctor for the rectal bleeding. Nursing facility also states that Pt. Has had lower back pain x3days. Denies abd. Pain & n/v/d. Pt. Glucose was 313 on ems arrival.

## 2015-07-05 NOTE — ED Notes (Signed)
Patient given ice chips per MD approval.  

## 2015-07-05 NOTE — H&P (Signed)
[facility] History and Physical  Alicia Mason WFU:[id number] DOB: 03-20-1922 DOA: 07/05/2015  Referring physician: ER PCP: Glo Herring., MD   Chief Complaint: Rectal bleeding  HPI: Alicia Mason is a 81 y.o. female  This is a 81 year old lady, who has a history of dementia, who presents with rectal bleeding which has been present for the last couple of days. She apparently also has been treated by the primary care physician for this in the last few days. She denies any hematemesis but does describe back pain for the last 3 days. She denies any nausea or vomiting or any abdominal pain. She's not been lightheaded or dizzy. She is diabetic. She is hypertensive. Evaluation in the emergency room shows her to have a hemoglobin of 5.7. She is now being admitted for further investigation and management.   Review of Systems:  Apart from symptoms above, all systems negative.  Past Medical History  Diagnosis Date  . Essential hypertension, benign   . Type 2 diabetes mellitus   . Coronary atherosclerosis of native coronary artery 1999    CABG LIMA to LAD, SVG to OM1 and OM2 and SVG to PDA in 1999,   . Closed fracture of right humerus     Managed conservatively 2011  . Cerebrovascular disease   . Seasonal allergies   . Arthritis   . Vertigo   . Ischemic cardiomyopathy     45-50% 07/2013/ EF 40% per Echo 06/2011.  Marland Kitchen Near syncope   . NSTEMI (non-ST elevated myocardial infarction)     10/02/2014 & 06/2011.  . Diabetes mellitus   . Acute ischemic stroke 08/04/2013    Right MCA stroke  . Atrial fibrillation 05/28/2009    Paroxysmal    . Dyslipidemia   . Chronic anxiety   . Acute on chronic combined systolic and diastolic heart failure 20/25/4270    EF 62-37%; grade 2 diastolic dysfunction  . Dementia    Past Surgical History  Procedure Laterality Date  . Coronary artery bypass graft  1999    LIMA to LAD, SVG to OM1 and OM2, SVG to PDA  . Cataract surgery    . Abdominal  hysterectomy      WITH BSO.  . Orif hip fracture Left 05/30/2014    Procedure: COMPRESSION HIP SCREW LEFT HIP;  Surgeon: Sanjuana Kava, MD;  Location: [facility];  Service: Orthopedics;  Laterality: Left;   Social History:  reports that she has never smoked. She has never used smokeless tobacco. She reports that she does not drink alcohol or use illicit drugs.  Allergies  Allergen Reactions  . Cephalexin     NOT SURE OF THE REACTION, BUT WAS TOLD NOT TO TAKE IT. Granddaughter mentioned that patient had hives, rash. Denies anaphylactic reaction.    Family History  Problem Relation Age of Onset  . CAD Sister     Died with MI age 60  . Pneumonia Mother   . Arthritis Other   . Coronary artery disease Other   . Diabetes Other   . Cancer Other     Prior to Admission medications   Medication Sig Start Date End Date Taking? Authorizing Provider  ALPRAZolam Duanne Moron) 0.5 MG tablet Take 1 tablet (0.5 mg total) by mouth 2 (two) times daily as needed for anxiety or sleep. Patient taking differently: Take 0.5 mg by mouth 3 (three) times daily as needed for anxiety or sleep.  12/26/14  Yes Tanna Furry, MD  cetirizine (ZYRTEC) 10 MG tablet Take 10  mg by mouth daily.   Yes Historical Provider, MD  ciprofloxacin (CIPRO) 250 MG tablet Take 250 mg by mouth 2 (two) times daily. 5 day course starting 07/02/2015   Yes Historical Provider, MD  donepezil (ARICEPT) 10 MG tablet Take 10 mg by mouth at bedtime.   Yes Historical Provider, MD  furosemide (LASIX) 20 MG tablet Take 1 tablet (20 mg total) by mouth 2 (two) times daily. For treatment of heart failure. Patient taking differently: Take 20 mg by mouth daily. For treatment of heart failure. 10/05/14  Yes Rexene Alberts, MD  HYDROcodone-acetaminophen (NORCO/VICODIN) 5-325 MG per tablet Take 1 tablet by mouth 2 (two) times daily.   Yes Historical Provider, MD  Insulin Glargine (LANTUS SOLOSTAR) 100 UNIT/ML Solostar Pen Inject 15 Units into the skin every morning.     Yes Historical Provider, MD  metoprolol (LOPRESSOR) 50 MG tablet Take 1 tablet (50 mg total) by mouth 2 (two) times daily. 06/01/14  Yes Nita Sells, MD  mirabegron ER (MYRBETRIQ) 25 MG TB24 tablet Take 25 mg by mouth daily.   Yes Historical Provider, MD  pravastatin (PRAVACHOL) 10 MG tablet Take 10 mg by mouth daily.    Yes Historical Provider, MD  Rivaroxaban (XARELTO) 15 MG TABS tablet Take 1 tablet (15 mg total) by mouth daily before breakfast. Patient taking differently: Take 15 mg by mouth daily with supper.  08/07/13  Yes Rexene Alberts, MD  sitaGLIPtin (JANUVIA) 50 MG tablet Take 50 mg by mouth every morning.   Yes Historical Provider, MD  metroNIDAZOLE (FLAGYL) 500 MG tablet Take 1 tablet (500 mg total) by mouth 3 (three) times daily. Patient not taking: Reported on 07/05/2015 05/14/15   Orpah Greek, MD  potassium chloride (K-DUR) 10 MEQ tablet Take 1 tablet (10 mEq total) by mouth 2 (two) times daily. Potassium. Take each tablet with furosemide twice daily. Patient not taking: Reported on 07/05/2015 10/05/14   Rexene Alberts, MD   Physical Exam: Filed Vitals:   07/05/15 1745 07/05/15 1748 07/05/15 1800 07/05/15 1809  BP: 120/57 120/57 115/51 120/50  Pulse: 71 71 65 66  Temp: 98.2 F (36.8 C) 98.2 F (36.8 C)  98.8 F (37.1 C)  TempSrc: Oral Oral  Oral  Resp: 21 20 21 23   Height:      Weight:      SpO2: 100% 100% 100% 100%    Wt Readings from Last 3 Encounters:  07/05/15 45.36 kg (100 lb)  05/14/15 51.71 kg (114 lb)  11/27/14 51.256 kg (113 lb)    General:  Appears pale. She is hemodynamically stable. Eyes: PERRL, normal lids, irises & conjunctiva ENT: grossly normal hearing, lips & tongue Neck: no LAD, masses or thyromegaly Cardiovascular: Irregularly irregular, consistent with atrial fibrillation. Telemetry: Atrial fibrillation. Respiratory: CTA bilaterally, no w/r/r. Normal respiratory effort. Abdomen: soft, ntnd Skin: no rash or induration seen on  limited exam Musculoskeletal: grossly normal tone BUE/BLE Psychiatric: grossly normal mood and affect, speech fluent and appropriate Neurologic: grossly non-focal.          Labs on Admission:  Basic Metabolic Panel:  Recent Labs Lab 07/05/15 1535  NA 135  K 3.9  CL 100*  CO2 28  GLUCOSE 258*  BUN 29*  CREATININE 1.07*  CALCIUM 7.8*   Liver Function Tests:  Recent Labs Lab 07/05/15 1535  AST 23  ALT 16  ALKPHOS 59  BILITOT 0.5  PROT 5.6*  ALBUMIN 3.1*   No results for input(s): LIPASE, AMYLASE in the last 168  hours. No results for input(s): AMMONIA in the last 168 hours. CBC:  Recent Labs Lab 07/05/15 1535  WBC 7.6  NEUTROABS 5.0  HGB 5.7*  HCT 17.0*  MCV 91.4  PLT 188   Cardiac Enzymes: No results for input(s): CKTOTAL, CKMB, CKMBINDEX, TROPONINI in the last 168 hours.  BNP (last 3 results) No results for input(s): BNP in the last 8760 hours.  ProBNP (last 3 results)  Recent Labs  10/01/14 0801  PROBNP 3877.0*    CBG:  Recent Labs Lab 07/05/15 1443  GLUCAP 244*    Radiological Exams on Admission: No results found.  EKG: Independently reviewed. This has not been done yet. I will order one now.  Assessment/Plan   1. GI bleed. Hemoglobin is 5.7 and she is hemodynamically stable. She will receive 2 units of blood transfusion initially. IV Protonix. Gastroenterology consultation. 2. Acute blood loss anemia. This is secondary to #1. 3. Atrial fibrillation. She is on chronic and currently therapy which will be held for the time being. 4. Diabetes. Continue with home medications and sliding scale. 5. Hypertension. Stable.  Further recommendations will depend on patient's hospital progress  Code Status: DO NOT RESUSCITATE.   DVT Prophylaxis: SCDs.  Family Communication: I discussed the plan with the patient's sister at the bedside. The patient's sister has healthcare power of attorney.   Disposition Plan: Depending on progress.    Time spent: 60 minutes.  Doree Albee [facility] Pager [phone].

## 2015-07-05 NOTE — ED Notes (Addendum)
CRITICAL VALUE ALERT  Critical value received:  Hgb 5.7  Date of notification:  07/05/2015  Time of notification:  8127  Critical value read back: Yes  Nurse who received alert:  Margreta Journey, RN  MD notified (1st page):  MD Audie Pinto, RN  Responding MD:  MD Audie Pinto  Time MD responded:  [phone]

## 2015-07-05 NOTE — ED Provider Notes (Signed)
CSN: [id number]     Arrival date & time 07/05/15  1422 History   First MD Initiated Contact with Patient 07/05/15 [phone]     Chief Complaint  Patient presents with  . Rectal Bleeding      Patient is a 81 y.o. female presenting with hematochezia. The history is provided by the nursing home and the EMS personnel.  Rectal Bleeding Pt. Picked up by [facility] at the [facility]. Staff reports dark blood on the pad appearing to come from her rectum x2days. Pt. Reports being treated recently by her primary care doctor for the rectal bleeding. Nursing facility also states that Pt. Has had lower back pain x3days. Denies abd. Pain & n/v/d.   Past Medical History  Diagnosis Date  . Essential hypertension, benign   . Type 2 diabetes mellitus   . Coronary atherosclerosis of native coronary artery 1999    CABG LIMA to LAD, SVG to OM1 and OM2 and SVG to PDA in 1999,   . Closed fracture of right humerus     Managed conservatively 2011  . Cerebrovascular disease   . Seasonal allergies   . Arthritis   . Vertigo   . Ischemic cardiomyopathy     45-50% 07/2013/ EF 40% per Echo 06/2011.  Marland Kitchen Near syncope   . NSTEMI (non-ST elevated myocardial infarction)     10/02/2014 & 06/2011.  . Diabetes mellitus   . Acute ischemic stroke 08/04/2013    Right MCA stroke  . Atrial fibrillation 05/28/2009    Paroxysmal    . Dyslipidemia   . Chronic anxiety   . Acute on chronic combined systolic and diastolic heart failure 18/84/1660    EF 63-01%; grade 2 diastolic dysfunction  . Dementia   . Dieulafoy lesion of colon 07/08/2015  . Colon polyps 07/08/2015  . Pyloric stenosis 07/08/2015   Past Surgical History  Procedure Laterality Date  . Coronary artery bypass graft  1999    LIMA to LAD, SVG to OM1 and OM2, SVG to PDA  . Cataract surgery    . Abdominal hysterectomy      WITH BSO.  . Orif hip fracture Left 05/30/2014    Procedure: COMPRESSION HIP SCREW LEFT HIP;  Surgeon: Sanjuana Kava, MD;   Location: [facility];  Service: Orthopedics;  Laterality: Left;   Family History  Problem Relation Age of Onset  . CAD Sister     Died with MI age 70  . Pneumonia Mother   . Arthritis Other   . Coronary artery disease Other   . Diabetes Other   . Cancer Other   . Colon cancer Neg Hx    Social History  Substance Use Topics  . Smoking status: Never Smoker   . Smokeless tobacco: Never Used  . Alcohol Use: No   OB History    No data available     Review of Systems  Unable to perform ROS: Dementia  Gastrointestinal: Positive for hematochezia.      Allergies  Cephalexin  Home Medications   Prior to Admission medications   Medication Sig Start Date End Date Taking? Authorizing Provider  ALPRAZolam Duanne Moron) 0.5 MG tablet Take 1 tablet (0.5 mg total) by mouth 2 (two) times daily as needed for anxiety or sleep. Patient taking differently: Take 0.5 mg by mouth 3 (three) times daily as needed for anxiety or sleep.  12/26/14  Yes Tanna Furry, MD  cetirizine (ZYRTEC) 10 MG tablet Take 10 mg by mouth daily.  Yes Historical Provider, MD  ciprofloxacin (CIPRO) 250 MG tablet Take 250 mg by mouth 2 (two) times daily. 5 day course starting 07/02/2015   Yes Historical Provider, MD  donepezil (ARICEPT) 10 MG tablet Take 10 mg by mouth at bedtime.   Yes Historical Provider, MD  furosemide (LASIX) 20 MG tablet Take 1 tablet (20 mg total) by mouth 2 (two) times daily. For treatment of heart failure. Patient taking differently: Take 20 mg by mouth daily. For treatment of heart failure. 10/05/14  Yes Rexene Alberts, MD  HYDROcodone-acetaminophen (NORCO/VICODIN) 5-325 MG per tablet Take 1 tablet by mouth 2 (two) times daily.   Yes Historical Provider, MD  Insulin Glargine (LANTUS SOLOSTAR) 100 UNIT/ML Solostar Pen Inject 15 Units into the skin every morning.    Yes Historical Provider, MD  metoprolol (LOPRESSOR) 50 MG tablet Take 1 tablet (50 mg total) by mouth 2 (two) times daily. 06/01/14  Yes Nita Sells, MD  mirabegron ER (MYRBETRIQ) 25 MG TB24 tablet Take 25 mg by mouth daily.   Yes Historical Provider, MD  pravastatin (PRAVACHOL) 10 MG tablet Take 10 mg by mouth daily.    Yes Historical Provider, MD  Rivaroxaban (XARELTO) 15 MG TABS tablet Take 1 tablet (15 mg total) by mouth daily before breakfast. Patient taking differently: Take 15 mg by mouth daily with supper.  08/07/13  Yes Rexene Alberts, MD  sitaGLIPtin (JANUVIA) 50 MG tablet Take 50 mg by mouth every morning.   Yes Historical Provider, MD  metroNIDAZOLE (FLAGYL) 500 MG tablet Take 1 tablet (500 mg total) by mouth 3 (three) times daily. Patient not taking: Reported on 07/05/2015 05/14/15   Orpah Greek, MD  potassium chloride (K-DUR) 10 MEQ tablet Take 1 tablet (10 mEq total) by mouth 2 (two) times daily. Potassium. Take each tablet with furosemide twice daily. Patient not taking: Reported on 07/05/2015 10/05/14   Rexene Alberts, MD   BP 128/66 mmHg  Pulse 65  Temp(Src) 98.9 F (37.2 C) (Oral)  Resp 20  Ht 5\' 5"  (1.651 m)  Wt 111 lb 1.8 oz (50.4 kg)  BMI 18.49 kg/m2  SpO2 97% Physical Exam  Constitutional: She appears well-developed and well-nourished. No distress.  HENT:  Head: Normocephalic and atraumatic.  Eyes: Pupils are equal, round, and reactive to light.  Neck: Normal range of motion.  Cardiovascular: Normal rate and intact distal pulses.   Pulmonary/Chest: No respiratory distress.  Abdominal: Normal appearance. She exhibits no distension. There is no tenderness. There is no rebound.  Musculoskeletal: Normal range of motion.  Neurological: She is alert. No cranial nerve deficit.  Skin: Skin is warm and dry. No rash noted. There is pallor.  Psychiatric: She has a normal mood and affect. Her behavior is normal.  Nursing note and vitals reviewed.   ED Course  Procedures (including critical care time) Labs Review Labs Reviewed  CBC WITH DIFFERENTIAL/PLATELET - Abnormal; Notable for the following:     RBC 1.86 (*)    Hemoglobin 5.7 (*)    HCT 17.0 (*)    All other components within normal limits  COMPREHENSIVE METABOLIC PANEL - Abnormal; Notable for the following:    Chloride 100 (*)    Glucose, Bld 258 (*)    BUN 29 (*)    Creatinine, Ser 1.07 (*)    Calcium 7.8 (*)    Total Protein 5.6 (*)    Albumin 3.1 (*)    GFR calc non Af Amer 44 (*)    GFR calc Af  Amer 51 (*)    All other components within normal limits  COMPREHENSIVE METABOLIC PANEL - Abnormal; Notable for the following:    Glucose, Bld 141 (*)    BUN 25 (*)    Calcium 7.8 (*)    Total Protein 5.2 (*)    Albumin 2.9 (*)    AST 49 (*)    GFR calc non Af Amer 59 (*)    All other components within normal limits  CBC - Abnormal; Notable for the following:    RBC 2.83 (*)    Hemoglobin 8.4 (*)    HCT 25.2 (*)    All other components within normal limits  GLUCOSE, CAPILLARY - Abnormal; Notable for the following:    Glucose-Capillary 178 (*)    All other components within normal limits  GLUCOSE, CAPILLARY - Abnormal; Notable for the following:    Glucose-Capillary 134 (*)    All other components within normal limits  IRON AND TIBC - Abnormal; Notable for the following:    Iron 14 (*)    TIBC 234 (*)    Saturation Ratios 6 (*)    All other components within normal limits  CBC - Abnormal; Notable for the following:    RBC 2.87 (*)    Hemoglobin 8.6 (*)    HCT 25.6 (*)    All other components within normal limits  CBC - Abnormal; Notable for the following:    WBC 13.0 (*)    RBC 2.79 (*)    Hemoglobin 8.3 (*)    HCT 24.6 (*)    All other components within normal limits  GLUCOSE, CAPILLARY - Abnormal; Notable for the following:    Glucose-Capillary 148 (*)    All other components within normal limits  BASIC METABOLIC PANEL - Abnormal; Notable for the following:    Potassium 3.4 (*)    Glucose, Bld 118 (*)    Calcium 7.9 (*)    All other components within normal limits  CBC - Abnormal; Notable for the  following:    RBC 2.99 (*)    Hemoglobin 8.9 (*)    HCT 26.8 (*)    All other components within normal limits  GLUCOSE, CAPILLARY - Abnormal; Notable for the following:    Glucose-Capillary 130 (*)    All other components within normal limits  GLUCOSE, CAPILLARY - Abnormal; Notable for the following:    Glucose-Capillary 109 (*)    All other components within normal limits  GLUCOSE, CAPILLARY - Abnormal; Notable for the following:    Glucose-Capillary 112 (*)    All other components within normal limits  CBC - Abnormal; Notable for the following:    RBC 3.06 (*)    Hemoglobin 9.0 (*)    HCT 28.0 (*)    All other components within normal limits  BASIC METABOLIC PANEL - Abnormal; Notable for the following:    Potassium 3.1 (*)    Calcium 7.8 (*)    All other components within normal limits  GLUCOSE, CAPILLARY - Abnormal; Notable for the following:    Glucose-Capillary 61 (*)    All other components within normal limits  GLUCOSE, CAPILLARY - Abnormal; Notable for the following:    Glucose-Capillary 137 (*)    All other components within normal limits  GLUCOSE, CAPILLARY - Abnormal; Notable for the following:    Glucose-Capillary 159 (*)    All other components within normal limits  GLUCOSE, CAPILLARY - Abnormal; Notable for the following:    Glucose-Capillary 177 (*)  All other components within normal limits  CBG MONITORING, ED - Abnormal; Notable for the following:    Glucose-Capillary 244 (*)    All other components within normal limits  POC OCCULT BLOOD, ED - Abnormal; Notable for the following:    Fecal Occult Bld POSITIVE (*)    All other components within normal limits  FERRITIN  GLUCOSE, CAPILLARY  GLUCOSE, CAPILLARY  GLUCOSE, CAPILLARY  MAGNESIUM  CBC  BASIC METABOLIC PANEL  MAGNESIUM  POC OCCULT BLOOD, ED  TYPE AND SCREEN  PREPARE RBC (CROSSMATCH)  SURGICAL PATHOLOGY    Imaging Review No results found. I, [place], personally reviewed and  evaluated these images and lab results as part of my medical decision-making.    MDM   Final diagnoses:  Gastrointestinal hemorrhage, unspecified gastritis, unspecified gastrointestinal hemorrhage type  Anemia due to blood loss, acute        Leonard Schwartz, MD 07/08/15 2007

## 2015-07-05 NOTE — ED Notes (Signed)
Patient is resting comfortably. 

## 2015-07-06 ENCOUNTER — Encounter (HOSPITAL_COMMUNITY): Payer: Self-pay | Admitting: Gastroenterology

## 2015-07-06 DIAGNOSIS — I480 Paroxysmal atrial fibrillation: Secondary | ICD-10-CM

## 2015-07-06 DIAGNOSIS — E119 Type 2 diabetes mellitus without complications: Secondary | ICD-10-CM

## 2015-07-06 DIAGNOSIS — D62 Acute posthemorrhagic anemia: Secondary | ICD-10-CM | POA: Diagnosis present

## 2015-07-06 DIAGNOSIS — K625 Hemorrhage of anus and rectum: Secondary | ICD-10-CM | POA: Diagnosis present

## 2015-07-06 LAB — IRON AND TIBC
Iron: 14 ug/dL — ABNORMAL LOW (ref 28–170)
Saturation Ratios: 6 % — ABNORMAL LOW (ref 10.4–31.8)
TIBC: 234 ug/dL — ABNORMAL LOW (ref 250–450)
UIBC: 220 ug/dL

## 2015-07-06 LAB — TYPE AND SCREEN
ABO/RH(D): O NEG
ANTIBODY SCREEN: NEGATIVE
UNIT DIVISION: 0
Unit division: 0

## 2015-07-06 LAB — CBC
HCT: 25.2 % — ABNORMAL LOW (ref 36.0–46.0)
HEMATOCRIT: 25.6 % — AB (ref 36.0–46.0)
HEMOGLOBIN: 8.6 g/dL — AB (ref 12.0–15.0)
Hemoglobin: 8.4 g/dL — ABNORMAL LOW (ref 12.0–15.0)
MCH: 29.7 pg (ref 26.0–34.0)
MCH: 30 pg (ref 26.0–34.0)
MCHC: 33.3 g/dL (ref 30.0–36.0)
MCHC: 33.6 g/dL (ref 30.0–36.0)
MCV: 89 fL (ref 78.0–100.0)
MCV: 89.2 fL (ref 78.0–100.0)
PLATELETS: 169 10*3/uL (ref 150–400)
Platelets: 194 10*3/uL (ref 150–400)
RBC: 2.83 MIL/uL — AB (ref 3.87–5.11)
RBC: 2.87 MIL/uL — AB (ref 3.87–5.11)
RDW: 14.5 % (ref 11.5–15.5)
RDW: 14.8 % (ref 11.5–15.5)
WBC: 7.8 10*3/uL (ref 4.0–10.5)
WBC: 9.8 10*3/uL (ref 4.0–10.5)

## 2015-07-06 LAB — COMPREHENSIVE METABOLIC PANEL
ALBUMIN: 2.9 g/dL — AB (ref 3.5–5.0)
ALK PHOS: 86 U/L (ref 38–126)
ALT: 30 U/L (ref 14–54)
AST: 49 U/L — AB (ref 15–41)
Anion gap: 7 (ref 5–15)
BUN: 25 mg/dL — AB (ref 6–20)
CALCIUM: 7.8 mg/dL — AB (ref 8.9–10.3)
CHLORIDE: 103 mmol/L (ref 101–111)
CO2: 26 mmol/L (ref 22–32)
CREATININE: 0.84 mg/dL (ref 0.44–1.00)
GFR calc Af Amer: >60 mL/min (ref >60)
GFR calc non Af Amer: 59 mL/min — ABNORMAL LOW (ref >60)
GLUCOSE: 141 mg/dL — AB (ref 65–99)
Potassium: 3.6 mmol/L (ref 3.5–5.1)
SODIUM: 136 mmol/L (ref 135–145)
Total Bilirubin: 1.2 mg/dL (ref 0.3–1.2)
Total Protein: 5.2 g/dL — ABNORMAL LOW (ref 6.5–8.1)

## 2015-07-06 LAB — GLUCOSE, CAPILLARY
GLUCOSE-CAPILLARY: 66 mg/dL (ref 65–99)
GLUCOSE-CAPILLARY: 91 mg/dL (ref 65–99)
Glucose-Capillary: 134 mg/dL — ABNORMAL HIGH (ref 65–99)
Glucose-Capillary: 148 mg/dL — ABNORMAL HIGH (ref 65–99)

## 2015-07-06 LAB — FERRITIN: FERRITIN: 20 ng/mL (ref 11–307)

## 2015-07-06 MED ORDER — PEG 3350-KCL-NA BICARB-NACL 420 G PO SOLR
2000.0000 mL | Freq: Once | ORAL | Status: AC
  Administered 2015-07-07: 2000 mL via ORAL

## 2015-07-06 MED ORDER — PEG 3350-KCL-NA BICARB-NACL 420 G PO SOLR
2000.0000 mL | Freq: Once | ORAL | Status: AC
  Administered 2015-07-06: 2000 mL via ORAL
  Filled 2015-07-06: qty 4000

## 2015-07-06 MED ORDER — SODIUM CHLORIDE 0.9 % IV SOLN: INTRAVENOUS | Status: DC

## 2015-07-06 NOTE — Progress Notes (Signed)
[facility] PROGRESS NOTE  Alicia Mason ALP:[id number] DOB: 1921-12-17 DOA: 07/05/2015 PCP: Glo Herring., MD  Summary  71 yof with history of dementia and AFib on anticoagulation admitted with rectal bleeding. GI has evaluated and plans to conduct a colonoscopy +/- EGD 8/17. She was transfused 2 units PRBCs on admission, continue to monitor her HGB, anticoagulations currently on hold.   Assessment/Plan: 1. GI Bleed. Hemoglobin is improving, although it is stil low at 8.4. Transfused 2 units PRBCs. GI has evaluated the pt recommends to conintue PPI and hold anticoagulations. Plans are for EGD and colonosopy 8/17. Continue clear liquids for now.  No hx of NSAIDs. She had a small amount of dark red blood with bowel movement this morning.  2. Acute blood loss anemia, secondary to GI bleed. Pt transfused 2 units PRBCs at admission. Will monitor HGB 3. Atrial fibrillation. Currently SR. She is anticoagulated with Xarelto, which will be held for the time being. Continue Metoprolol.  4. DM Type 2.Will hold oral hypergylcemia continue Lantus and SSI.  5. Hyperlipidemia. Continue statin.  6. Anxiety. Continue Xanax PRN 7. Dementia. Continue Aricept.  8. Hypertension. Stable, continue Metoprolol.   Code Status:DNR DVT prophylaxis: SCDs Family Communication: Sister, Paulette bedside. Discussed with patient who understands and has no concerns at this time. Disposition Plan: Discharge once symptoms improve.    Consultants:  GI- Dr Oneida Alar   Procedures:    Antibiotics:    HPI/Subjective: Feeling ok but is having consistent back pain, this is chronic. Small bowel movement with small amounts of blood noticed while cleaning. Denies SOB, N/V/D, dizziness, or lightheadedness.   Objective: Filed Vitals:   07/06/15 0528  BP: 130/58  Pulse: 74  Temp: 98.5 F (36.9 C)  Resp: 20    Intake/Output Summary (Last 24 hours) at 07/06/15 1028 Last data filed at 07/06/15 0905  Gross per  24 hour  Intake   1618 ml  Output      0 ml  Net   1618 ml   Filed Weights   07/05/15 1432 07/05/15 2006  Weight: 45.36 kg (100 lb) 50.4 kg (111 lb 1.8 oz)    Exam: General: NAD, looks comfortable lying in bed. Cardiovascular: RRR, S1, S2  Respiratory: clear bilaterally, No wheezing, rales or rhonchi Abdomen: soft, non tender, no distention , bowel sounds normal Musculoskeletal: No edema b/l  Data Reviewed: Basic Metabolic Panel:  Recent Labs Lab 07/05/15 1535 07/06/15 0510  NA 135 136  K 3.9 3.6  CL 100* 103  CO2 28 26  GLUCOSE 258* 141*  BUN 29* 25*  CREATININE 1.07* 0.84  CALCIUM 7.8* 7.8*   Liver Function Tests:  Recent Labs Lab 07/05/15 1535 07/06/15 0510  AST 23 49*  ALT 16 30  ALKPHOS 59 86  BILITOT 0.5 1.2  PROT 5.6* 5.2*  ALBUMIN 3.1* 2.9*  CBC:  Recent Labs Lab 07/05/15 1535 07/06/15 0510  WBC 7.6 7.8  NEUTROABS 5.0  --   HGB 5.7* 8.4*  HCT 17.0* 25.2*  MCV 91.4 89.0  PLT 188 169    ProBNP (last 3 results)  Recent Labs  10/01/14 0801  PROBNP 3877.0*    CBG:  Recent Labs Lab 07/05/15 1443 07/05/15 2032 07/06/15 0720  GLUCAP 244* 178* 134*    No results found for this or any previous visit (from the past 240 hour(s)).   Studies: No results found.  Scheduled Meds: . ciprofloxacin  250 mg Oral BID  . donepezil  10 mg Oral QHS  .  HYDROcodone-acetaminophen  1 tablet Oral BID  . insulin aspart  0-15 Units Subcutaneous TID WC  . insulin aspart  0-5 Units Subcutaneous QHS  . insulin glargine  15 Units Subcutaneous q morning - 10a  . linagliptin  5 mg Oral Daily  . loratadine  10 mg Oral Daily  . metoprolol  50 mg Oral BID  . mirabegron ER  25 mg Oral Daily  . pantoprazole (PROTONIX) IV  40 mg Intravenous Q24H  . polyethylene glycol-electrolytes  2,000 mL Oral Once  . [START ON 07/07/2015] polyethylene glycol-electrolytes  2,000 mL Oral Once  . pravastatin  10 mg Oral q1800  . sodium chloride  3 mL Intravenous Q12H    Continuous Infusions: . sodium chloride 1 mL (07/06/15 0233)  . sodium chloride      Active Problems:   Atrial fibrillation   DM type 2 (diabetes mellitus, type 2)   Anemia   Essential hypertension   GI bleed   Anemia due to blood loss, acute   Rectal bleeding    Time spent: 30 minutes     Kathie Dike, MD [facility] Pager [phone]. If 7PM-7AM, please contact night-coverage at [email or web address], password [facility] 07/06/2015, 10:28 AM  LOS: 1 day    I, Jessica D. Leonie Green, acting as scribe, recorded this note contemporaneously in the presence of Dr. Kathie Dike, M.D. on 07/06/2015.  I have reviewed the above documentation for accuracy and completeness, and I agree with the above.  MEMON,JEHANZEB

## 2015-07-06 NOTE — Care Management Note (Signed)
Case Management Note  Patient Details  Name: Alicia Mason MRN: [id number] Date of Birth: 11/14/1922  Subjective/Objective:                  Pt admitted from [facility] in [place] with gi bleed. Anticipate discharge back to facility at discharge.  Action/Plan: CSW is aware and will arrange discharge to facility when pt is medically stable.  Expected Discharge Date:  07/07/15               Expected Discharge Plan:  Assisted Living / Rest Home  In-House Referral:  Clinical Social Work  Discharge planning Services  CM Consult  Post Acute Care Choice:  NA Choice offered to:  NA  DME Arranged:    DME Agency:     HH Arranged:    [place] Agency:     Status of Service:  Completed, signed off  Medicare Important Message Given:    Date Medicare IM Given:    Medicare IM give by:    Date Additional Medicare IM Given:    Additional Medicare Important Message give by:     If discussed at [place] of Stay Meetings, dates discussed:    Additional Comments:  Joylene Draft, RN 07/06/2015, 11:35 AM

## 2015-07-06 NOTE — Clinical Social Work Note (Signed)
Clinical Social Work Assessment  Patient Details  Name: Alicia Mason MRN: [id number] Date of Birth: [birth year]  Date of referral:  07/06/15               Reason for consult:  Facility Placement                Permission sought to share information with:    Permission granted to share information::     Name::        Agency::     Relationship::     Contact Information:     Housing/Transportation Living arrangements for the past 2 months:  [place] of Information:  Patient, Facility, Other (Comment Required) (Sister, Education administrator. ) Patient Interpreter Needed:  None Criminal Activity/Legal Involvement Pertinent to Current Situation/Hospitalization:  No - Comment as needed Significant Relationships:  Siblings, Other Family Members Lives with:  Facility Resident Do you feel safe going back to the place where you live?  Yes Need for family participation in patient care:  Yes (Comment)  Care giving concerns:  Facility resident   Facilities manager / plan:  CSW met with patient who could not recall how long she has been a resident at [facility].  Patient stated that she does not know if she want to go back. She said that she would rather go home.  Patient advised that she ambulates with a rollator walker and that staff at [facility] assist her with bathing and washing her hair.  Patient stated that she receives a lot of visits "from family, friends, a lot of kin folks." Patient stated that she would be able to go home if her family could find someone to stay with her.  She stated she wants to find a "country white woman" to come an sit with her in her home.  CSW spoke with patient's sister, Alicia Mason, who indicated that she was patient's POA. Ms. Dorann Ou advised that patient has been at [facility] about two months.  She stated that the plan is for patient to return to [facility] upon discharge from the hospital.  Ms. Dorann Ou advised that she and the family are  trying to find someone to come in to patient's home and stay with her so that patient can return home.  She stated that until they can find someone to hire to come and stay with patient, she will have to return to [facility].  CSW spoke with Lynelle Smoke, nurse, at [facility].  Tammy confirmed patient's and patient's family's statements.  She stated that patient can return to the facility upon discharge.  Tammy advised that patient was in the memory care unit at the facility.    Employment status:  Retired Health visitor, Managed Care PT Recommendations:  Not assessed at this time Information / Referral to community resources:     Patient/Family's Response to care:  Patient's family is agreeable for patient to return to [facility] upon discharge.  Patient/Family's Understanding of and Emotional Response to Diagnosis, Current Treatment, and Prognosis:  Patient's family understands patient's diagnosis, current treatment and prognosis and realizes that she needs 24/7 care.   Emotional Assessment Appearance:  Developmentally appropriate Attitude/Demeanor/Rapport:   (Cooperative) Affect (typically observed):  Calm Orientation:  Oriented to Self, Oriented to Place, Oriented to Situation Alcohol / Substance use:  Not Applicable Psych involvement (Current and /or in the community):  No (Comment)  Discharge Needs  Concerns to be addressed:  Discharge Planning Concerns Readmission within the last 30 days:  Yes Current discharge risk:  None Barriers to Discharge:  No Barriers Identified   Ihor Gully, LCSW 07/06/2015, 3:41 PM [phone]

## 2015-07-06 NOTE — Consult Note (Signed)
Referring Provider: Dr. Anastasio Champion Primary Care Physician:  Glo Herring., MD Primary Gastroenterologist:  Dr. Gala Romney   Date of Admission: 07/05/15 Date of Consultation: 07/06/15  Reason for Consultation:  Acute blood loss anemia   HPI:  Alicia Mason is a 81 y.o. year old female from [place] who reports approximately a 1-2 week history of low-volume dark red/black red rectal bleeding. Per RN at [facility], last dose of Xarelto 8/14. Patient was taken to [facility] due to looking "pale" and associated rectal bleeding. Hgb on admission 5.7. 2 units PRBCs given, with appropriate rise in Hgb to 8.4. Prior trend shows Hgb in 11/12 range. Tiny amount of dark red blood with BM this morning.   Patient denies any abdominal pain, constipation, diarrhea. Patient states she had "2 places on my rectum that had to be fixed about a month ago". Poor historian due to mild dementia. Notes intermittent reflux, no dysphagia, no NSAIDs. No prior colonoscopy or EGD. No FH of colon cancer.   Past Medical History  Diagnosis Date  . Essential hypertension, benign   . Type 2 diabetes mellitus   . Coronary atherosclerosis of native coronary artery 1999    CABG LIMA to LAD, SVG to OM1 and OM2 and SVG to PDA in 1999,   . Closed fracture of right humerus     Managed conservatively 2011  . Cerebrovascular disease   . Seasonal allergies   . Arthritis   . Vertigo   . Ischemic cardiomyopathy     45-50% 07/2013/ EF 40% per Echo 06/2011.  Marland Kitchen Near syncope   . NSTEMI (non-ST elevated myocardial infarction)     10/02/2014 & 06/2011.  . Diabetes mellitus   . Acute ischemic stroke 08/04/2013    Right MCA stroke  . Atrial fibrillation 05/28/2009    Paroxysmal    . Dyslipidemia   . Chronic anxiety   . Acute on chronic combined systolic and diastolic heart failure 42/35/3614    EF 43-15%; grade 2 diastolic dysfunction  . Dementia     Past Surgical History  Procedure Laterality Date  . Coronary artery bypass  graft  1999    LIMA to LAD, SVG to OM1 and OM2, SVG to PDA  . Cataract surgery    . Abdominal hysterectomy      WITH BSO.  . Orif hip fracture Left 05/30/2014    Procedure: COMPRESSION HIP SCREW LEFT HIP;  Surgeon: Sanjuana Kava, MD;  Location: [facility];  Service: Orthopedics;  Laterality: Left;    Prior to Admission medications   Medication Sig Start Date End Date Taking? Authorizing Provider  ALPRAZolam Duanne Moron) 0.5 MG tablet Take 1 tablet (0.5 mg total) by mouth 2 (two) times daily as needed for anxiety or sleep. Patient taking differently: Take 0.5 mg by mouth 3 (three) times daily as needed for anxiety or sleep.  12/26/14  Yes Tanna Furry, MD  cetirizine (ZYRTEC) 10 MG tablet Take 10 mg by mouth daily.   Yes Historical Provider, MD  ciprofloxacin (CIPRO) 250 MG tablet Take 250 mg by mouth 2 (two) times daily. 5 day course starting 07/02/2015   Yes Historical Provider, MD  donepezil (ARICEPT) 10 MG tablet Take 10 mg by mouth at bedtime.   Yes Historical Provider, MD  furosemide (LASIX) 20 MG tablet Take 1 tablet (20 mg total) by mouth 2 (two) times daily. For treatment of heart failure. Patient taking differently: Take 20 mg by mouth daily. For treatment of heart failure. 10/05/14  Yes  Rexene Alberts, MD  HYDROcodone-acetaminophen (NORCO/VICODIN) 5-325 MG per tablet Take 1 tablet by mouth 2 (two) times daily.   Yes Historical Provider, MD  Insulin Glargine (LANTUS SOLOSTAR) 100 UNIT/ML Solostar Pen Inject 15 Units into the skin every morning.    Yes Historical Provider, MD  metoprolol (LOPRESSOR) 50 MG tablet Take 1 tablet (50 mg total) by mouth 2 (two) times daily. 06/01/14  Yes Nita Sells, MD  mirabegron ER (MYRBETRIQ) 25 MG TB24 tablet Take 25 mg by mouth daily.   Yes Historical Provider, MD  pravastatin (PRAVACHOL) 10 MG tablet Take 10 mg by mouth daily.    Yes Historical Provider, MD  Rivaroxaban (XARELTO) 15 MG TABS tablet Take 1 tablet (15 mg total) by mouth daily before  breakfast. Patient taking differently: Take 15 mg by mouth daily with supper.  08/07/13  Yes Rexene Alberts, MD  sitaGLIPtin (JANUVIA) 50 MG tablet Take 50 mg by mouth every morning.   Yes Historical Provider, MD  metroNIDAZOLE (FLAGYL) 500 MG tablet Take 1 tablet (500 mg total) by mouth 3 (three) times daily. Patient not taking: Reported on 07/05/2015 05/14/15   Orpah Greek, MD  potassium chloride (K-DUR) 10 MEQ tablet Take 1 tablet (10 mEq total) by mouth 2 (two) times daily. Potassium. Take each tablet with furosemide twice daily. Patient not taking: Reported on 07/05/2015 10/05/14   Rexene Alberts, MD    Current Facility-Administered Medications  Medication Dose Route Frequency Provider Last Rate Last Dose  . 0.9 %  sodium chloride infusion   Intravenous Continuous Doree Albee, MD 50 mL/hr at 07/06/15 0233 1 mL at 07/06/15 0233  . ALPRAZolam (XANAX) tablet 0.5 mg  0.5 mg Oral BID PRN Nimish Luther Parody, MD      . ciprofloxacin (CIPRO) tablet 250 mg  250 mg Oral BID Doree Albee, MD   250 mg at 07/05/15 2249  . donepezil (ARICEPT) tablet 10 mg  10 mg Oral QHS Nimish C Anastasio Champion, MD   10 mg at 07/05/15 2249  . HYDROcodone-acetaminophen (NORCO/VICODIN) 5-325 MG per tablet 1 tablet  1 tablet Oral BID Doree Albee, MD   1 tablet at 07/05/15 2248  . insulin aspart (novoLOG) injection 0-15 Units  0-15 Units Subcutaneous TID WC Nimish C Gosrani, MD      . insulin aspart (novoLOG) injection 0-5 Units  0-5 Units Subcutaneous QHS Doree Albee, MD   0 Units at 07/05/15 2200  . insulin glargine (LANTUS) injection 15 Units  15 Units Subcutaneous q morning - 10a Nimish C Gosrani, MD      . linagliptin (TRADJENTA) tablet 5 mg  5 mg Oral Daily Nimish C Gosrani, MD      . loratadine (CLARITIN) tablet 10 mg  10 mg Oral Daily Nimish C Gosrani, MD      . metoprolol (LOPRESSOR) tablet 50 mg  50 mg Oral BID Nimish C Anastasio Champion, MD   50 mg at 07/05/15 2250  . mirabegron ER (MYRBETRIQ) tablet 25 mg   25 mg Oral Daily Nimish C Gosrani, MD      . ondansetron (ZOFRAN) tablet 4 mg  4 mg Oral Q6H PRN Nimish Luther Parody, MD       Or  . ondansetron (ZOFRAN) injection 4 mg  4 mg Intravenous Q6H PRN Nimish C Gosrani, MD      . pantoprazole (PROTONIX) injection 40 mg  40 mg Intravenous Q24H Nimish C Gosrani, MD   40 mg at 07/05/15 2248  . pravastatin (PRAVACHOL)  tablet 10 mg  10 mg Oral q1800 Nimish Luther Parody, MD   10 mg at 07/05/15 2249  . sodium chloride 0.9 % injection 3 mL  3 mL Intravenous Q12H Nimish C Gosrani, MD   3 mL at 07/05/15 2200    Allergies as of 07/05/2015 - Review Complete 07/05/2015  Allergen Reaction Noted  . Cephalexin  07/11/2011    Family History  Problem Relation Age of Onset  . CAD Sister     Died with MI age 11  . Pneumonia Mother   . Arthritis Other   . Coronary artery disease Other   . Diabetes Other   . Cancer Other   . Colon cancer Neg Hx     Social History   Social History  . Marital Status: Widowed    Spouse Name: N/A  . Number of Children: N/A  . Years of Education: N/A   Occupational History  . Retired    Social History Main Topics  . Smoking status: Never Smoker   . Smokeless tobacco: Never Used  . Alcohol Use: No  . Drug Use: No  . Sexual Activity: Not Currently   Other Topics Concern  . Not on file   Social History Narrative    Review of Systems: Gen: see HPI.  CV: Denies chest pain, heart palpitations Resp: Denies shortness of breath with rest, cough, wheezing GI: see HPI GU : Denies urinary burning, urinary frequency, urinary incontinence.  MS: chronic back pain  Derm: bruising, fragile skin  Psych: Denies depression, anxiety Heme: see HPI  Physical Exam: Vital signs in last 24 hours: Temp:  [98.2 F (36.8 C)-99.6 F (37.6 C)] 98.5 F (36.9 C) (08/16 0528) Pulse Rate:  [50-74] 74 (08/16 0528) Resp:  [15-23] 20 (08/16 0528) BP: (111-131)/(47-84) 130/58 mmHg (08/16 0528) SpO2:  [96 %-100 %] 98 % (08/16 0528) Weight:   [100 lb (45.36 kg)-111 lb 1.8 oz (50.4 kg)] 111 lb 1.8 oz (50.4 kg) (08/15 2006)   General:   Alert, appears stated age, frail Head:  Normocephalic and atraumatic. Eyes:  Sclera clear, no icterus.   Conjunctiva pink. Ears:  Hard of hearing Nose:  No deformity, discharge,  or lesions. Mouth:  No deformity or lesions Lungs:  Clear throughout to auscultation.   No wheezes, crackles, or rhonchi. No acute distress. Heart:  S1 S2 present, query soft murmur  Abdomen:  Soft, nontender and nondistended. No masses, hepatosplenomegaly or hernias noted. Normal bowel sounds, without guarding, and without rebound.   Rectal:  Deferred until time of colonoscopy.   Msk:  Symmetrical without gross deformities. Kyphosis  Extremities:  Without edema. Neurologic:  Alert and oriented to person and situation. Slow to remember year but recalls correctly after contemplation. Unable to name place of residence prior to hospital.  Skin:  Intact without significant lesions or rashes. Psych:  Alert and cooperative. Normal mood and affect.  Intake/Output from previous day: 08/15 0701 - 08/16 0700 In: 1258 [I.V.:253; Blood:1005] Out: -  Intake/Output this shift: Total I/O In: 360 [P.O.:360] Out: -   Lab Results:  Recent Labs  07/05/15 1535 07/06/15 0510  WBC 7.6 7.8  HGB 5.7* 8.4*  HCT 17.0* 25.2*  PLT 188 169   BMET  Recent Labs  07/05/15 1535 07/06/15 0510  NA 135 136  K 3.9 3.6  CL 100* 103  CO2 28 26  GLUCOSE 258* 141*  BUN 29* 25*  CREATININE 1.07* 0.84  CALCIUM 7.8* 7.8*   LFT  Recent Labs  07/05/15 1535 07/06/15 0510  PROT 5.6* 5.2*  ALBUMIN 3.1* 2.9*  AST 23 49*  ALT 16 30  ALKPHOS 59 86  BILITOT 0.5 1.2   Impression: 81 year old female admitted with acute blood loss anemia anemia with Hgb 5 range (historically in 11 range), associated low-volume hematochezia in the setting of chronic Xarelto for afib. Last dose of anticoagulation on 8/14 and remains on hold since  admission. Appropriate response to 2 units PRBCs, with last evidence of overt bleeding this morning with BM and very small amount. With presence of anticoagulation bleeding could be emanating anywhere in GI tract but less likely a rapid transit upper GI bleed. With no prior history of colonoscopy, recommend at a minimum a colonoscopy and anticipate EGD as well to exclude upper GI source. Hemodynamically, patient remains stable. Remain on clear liquids for now and will schedule for procedures on 8/17 with Dr. Oneida Alar. Xarelto will have been on hold for greater than 48 hours at that point and appropriate for invasive procedures. Although patient has history of dementia, she is able to sign her own consents. This was verified with [facility] as well.   Plan: Remain on clear liquids Increase Protonix to BID Continue to HOLD Xarelto  Colonoscopy +/- EGD with Dr. Oneida Alar on 8/17 Golytely 2 liters this afternoon and repeat 2 liters in am 2 tap water enemas in am Iron/ferritin, TIBC  Monitor for any worsening rectal bleeding Repeat CBC in am  Orvil Feil, ANP-BC [facility]      LOS: 1 day    07/06/2015, 9:12 AM

## 2015-07-07 ENCOUNTER — Encounter (HOSPITAL_COMMUNITY): Payer: Self-pay | Admitting: *Deleted

## 2015-07-07 ENCOUNTER — Encounter (HOSPITAL_COMMUNITY)
Admission: EM | Disposition: A | Discharge status: Home Health | Payer: Self-pay | Source: Home / Self Care | Attending: Internal Medicine

## 2015-07-07 DIAGNOSIS — K639 Disease of intestine, unspecified: Secondary | ICD-10-CM

## 2015-07-07 DIAGNOSIS — I255 Ischemic cardiomyopathy: Secondary | ICD-10-CM

## 2015-07-07 DIAGNOSIS — I482 Chronic atrial fibrillation: Secondary | ICD-10-CM

## 2015-07-07 DIAGNOSIS — K922 Gastrointestinal hemorrhage, unspecified: Secondary | ICD-10-CM

## 2015-07-07 DIAGNOSIS — D128 Benign neoplasm of rectum: Secondary | ICD-10-CM

## 2015-07-07 DIAGNOSIS — D122 Benign neoplasm of ascending colon: Secondary | ICD-10-CM

## 2015-07-07 HISTORY — PX: ESOPHAGOGASTRODUODENOSCOPY: SHX5428

## 2015-07-07 HISTORY — PX: COLONOSCOPY: SHX5424

## 2015-07-07 LAB — CBC
HCT: 26.8 % — ABNORMAL LOW (ref 36.0–46.0)
HEMATOCRIT: 24.6 % — AB (ref 36.0–46.0)
HEMOGLOBIN: 8.3 g/dL — AB (ref 12.0–15.0)
HEMOGLOBIN: 8.9 g/dL — AB (ref 12.0–15.0)
MCH: 29.7 pg (ref 26.0–34.0)
MCH: 29.8 pg (ref 26.0–34.0)
MCHC: 33.2 g/dL (ref 30.0–36.0)
MCHC: 33.7 g/dL (ref 30.0–36.0)
MCV: 89.6 fL (ref 78.0–100.0)
MCV: 88.2 fL (ref 78.0–100.0)
PLATELETS: 216 10*3/uL (ref 150–400)
Platelets: 191 10*3/uL (ref 150–400)
RBC: 2.79 MIL/uL — ABNORMAL LOW (ref 3.87–5.11)
RBC: 2.99 MIL/uL — ABNORMAL LOW (ref 3.87–5.11)
RDW: 14.5 % (ref 11.5–15.5)
RDW: 14.5 % (ref 11.5–15.5)
WBC: 9.4 10*3/uL (ref 4.0–10.5)
WBC: 13 10*3/uL — AB (ref 4.0–10.5)

## 2015-07-07 LAB — BASIC METABOLIC PANEL
ANION GAP: 10 (ref 5–15)
BUN: 18 mg/dL (ref 6–20)
CALCIUM: 7.9 mg/dL — AB (ref 8.9–10.3)
CO2: 24 mmol/L (ref 22–32)
CREATININE: 0.8 mg/dL (ref 0.44–1.00)
Chloride: 104 mmol/L (ref 101–111)
GFR calc Af Amer: >60 mL/min (ref >60)
GLUCOSE: 118 mg/dL — AB (ref 65–99)
Potassium: 3.4 mmol/L — ABNORMAL LOW (ref 3.5–5.1)
Sodium: 138 mmol/L (ref 135–145)

## 2015-07-07 LAB — GLUCOSE, CAPILLARY
GLUCOSE-CAPILLARY: 130 mg/dL — AB (ref 65–99)
Glucose-Capillary: 109 mg/dL — ABNORMAL HIGH (ref 65–99)
Glucose-Capillary: 112 mg/dL — ABNORMAL HIGH (ref 65–99)
Glucose-Capillary: 70 mg/dL (ref 65–99)

## 2015-07-07 SURGERY — COLONOSCOPY
Anesthesia: Moderate Sedation

## 2015-07-07 MED ORDER — BISACODYL 5 MG PO TBEC
10.0000 mg | DELAYED_RELEASE_TABLET | Freq: Once | ORAL | Status: AC
  Administered 2015-07-07: 10 mg via ORAL
  Filled 2015-07-07: qty 2

## 2015-07-07 MED ORDER — PANTOPRAZOLE SODIUM 40 MG PO TBEC
40.0000 mg | DELAYED_RELEASE_TABLET | Freq: Two times a day (BID) | ORAL | Status: DC
  Administered 2015-07-08 – 2015-07-10 (×4): 40 mg via ORAL
  Filled 2015-07-07 (×4): qty 1

## 2015-07-07 MED ORDER — SIMETHICONE 40 MG/0.6ML PO SUSP
ORAL | Status: DC | PRN
Start: 2015-07-07 — End: 2015-07-07
  Administered 2015-07-07: 15:00:00

## 2015-07-07 MED ORDER — MIDAZOLAM HCL 5 MG/5ML IJ SOLN
INTRAMUSCULAR | Status: DC | PRN
  Administered 2015-07-07: 2 mg via INTRAVENOUS

## 2015-07-07 MED ORDER — MEPERIDINE HCL 100 MG/ML IJ SOLN
INTRAMUSCULAR | Status: DC | PRN
Start: 2015-07-07 — End: 2015-07-07
  Administered 2015-07-07 (×2): 25 mg via INTRAVENOUS

## 2015-07-07 MED ORDER — MEPERIDINE HCL 100 MG/ML IJ SOLN
INTRAMUSCULAR | Status: AC
  Filled 2015-07-07: qty 2

## 2015-07-07 MED ORDER — MIDAZOLAM HCL 5 MG/5ML IJ SOLN
INTRAMUSCULAR | Status: AC
  Filled 2015-07-07: qty 10

## 2015-07-07 MED ORDER — LIDOCAINE VISCOUS 2 % MT SOLN
OROMUCOSAL | Status: DC
Start: 2015-07-07 — End: 2015-07-07
  Filled 2015-07-07: qty 15

## 2015-07-07 MED ORDER — ATROPINE SULFATE 1 MG/ML IJ SOLN
INTRAMUSCULAR | Status: AC
  Filled 2015-07-07: qty 1

## 2015-07-07 MED ORDER — INSULIN ASPART 100 UNIT/ML ~~LOC~~ SOLN
0.0000 [IU] | Freq: Three times a day (TID) | SUBCUTANEOUS | Status: DC
  Administered 2015-07-09: 3 [IU] via SUBCUTANEOUS
  Administered 2015-07-09: 2 [IU] via SUBCUTANEOUS
  Administered 2015-07-09: 5 [IU] via SUBCUTANEOUS
  Administered 2015-07-10 (×2): 2 [IU] via SUBCUTANEOUS

## 2015-07-07 MED ORDER — ATROPINE SULFATE 1 MG/ML IJ SOLN
1.0000 mg | Freq: Once | INTRAMUSCULAR | Status: AC
Start: 2015-07-07 — End: 2015-07-07
  Administered 2015-07-07: 1 mg via INTRAVENOUS

## 2015-07-07 MED ORDER — INSULIN ASPART 100 UNIT/ML ~~LOC~~ SOLN
0.0000 [IU] | Freq: Every day | SUBCUTANEOUS | Status: DC
  Administered 2015-07-08 – 2015-07-09 (×2): 2 [IU] via SUBCUTANEOUS

## 2015-07-07 NOTE — Progress Notes (Addendum)
[facility] PROGRESS NOTE  Alicia Mason FGH:[id number] DOB: April 24, 1922 DOA: 07/05/2015 PCP: Glo Herring., MD    Code Status: DO NOT RESUSCITATE Family Communication: Discussed with patient's sister, Mrs. Capps Disposition Plan: Discharge when clinically appropriate.   Consultants:  Gastroenterology  Procedures:  EGD/colonoscopy pending  Antibiotics:  None  HPI/Subjective: Patient complains of loose stools after being given GoLYTELY. Otherwise she has no complaints.  Objective: Filed Vitals:   07/07/15 0430  BP: 132/61  Pulse: 70  Temp: 98.4 F (36.9 C)  Resp: 18   oxygen saturation 95% on room air.  Intake/Output Summary (Last 24 hours) at 07/07/15 1158 Last data filed at 07/06/15 1900  Gross per 24 hour  Intake    869 ml  Output    110 ml  Net    759 ml   Filed Weights   07/05/15 1432 07/05/15 2006  Weight: 45.36 kg (100 lb) 50.4 kg (111 lb 1.8 oz)    Exam:   General:  Elderly 81 year old woman in no acute distress.  Cardiovascular: Irregular, irregular.  Respiratory: Lungs clear anteriorly. Breathing nonlabored.  Abdomen: Positive bowel sounds, soft, nontender, nondistended.  Musculoskeletal/extremities: No pedal edema. No acute hot red joints.  Neurologic: She is alert and oriented to herself, hospital, and family. Cranial nerves II through XII are grossly intact.   Data Reviewed: Basic Metabolic Panel:  Recent Labs Lab 07/05/15 1535 07/06/15 0510 07/07/15 0921  NA 135 136 138  K 3.9 3.6 3.4*  CL 100* 103 104  CO2 28 26 24   GLUCOSE 258* 141* 118*  BUN 29* 25* 18  CREATININE 1.07* 0.84 0.80  CALCIUM 7.8* 7.8* 7.9*   Liver Function Tests:  Recent Labs Lab 07/05/15 1535 07/06/15 0510  AST 23 49*  ALT 16 30  ALKPHOS 59 86  BILITOT 0.5 1.2  PROT 5.6* 5.2*  ALBUMIN 3.1* 2.9*   No results for input(s): LIPASE, AMYLASE in the last 168 hours. No results for input(s): AMMONIA in the last 168 hours. CBC:  Recent  Labs Lab 07/05/15 1535 07/06/15 0510 07/06/15 1544 07/06/15 2341 07/07/15 0925  WBC 7.6 7.8 9.8 13.0* 9.4  NEUTROABS 5.0  --   --   --   --   HGB 5.7* 8.4* 8.6* 8.3* 8.9*  HCT 17.0* 25.2* 25.6* 24.6* 26.8*  MCV 91.4 89.0 89.2 88.2 89.6  PLT 188 169 194 191 216   Cardiac Enzymes: No results for input(s): CKTOTAL, CKMB, CKMBINDEX, TROPONINI in the last 168 hours. BNP (last 3 results) No results for input(s): BNP in the last 8760 hours.  ProBNP (last 3 results)  Recent Labs  10/01/14 0801  PROBNP 3877.0*    CBG:  Recent Labs Lab 07/06/15 0720 07/06/15 1115 07/06/15 1625 07/06/15 2111 07/07/15 0753  GLUCAP 134* 148* 66 91 130*    No results found for this or any previous visit (from the past 240 hour(s)).   Studies: No results found.  Scheduled Meds: . bisacodyl  10 mg Oral Once  . donepezil  10 mg Oral QHS  . HYDROcodone-acetaminophen  1 tablet Oral BID  . insulin aspart  0-15 Units Subcutaneous TID WC  . insulin aspart  0-5 Units Subcutaneous QHS  . insulin glargine  15 Units Subcutaneous q morning - 10a  . loratadine  10 mg Oral Daily  . metoprolol  50 mg Oral BID  . mirabegron ER  25 mg Oral Daily  . pantoprazole (PROTONIX) IV  40 mg Intravenous Q24H  . pravastatin  10  mg Oral q1800  . sodium chloride  3 mL Intravenous Q12H   Continuous Infusions: . sodium chloride 1 mL (07/06/15 0233)  . sodium chloride     Assessment and plan:  Principal Problem:   Bleeding gastrointestinal Active Problems:   Anemia due to blood loss, acute   Rectal bleeding   Ischemic cardiomyopathy   Atrial fibrillation   DM type 2 (diabetes mellitus, type 2)   Essential hypertension   1. GI bleeding in the setting of anticoagulation on Xarelto. On admission, the patient complained of red to black rectal bleeding. -Xarelto was discontinued. She was started on IV Protonix. -Gastroenterology has been consulted and plans an EGD/colonoscopy today. -Would recommend  discontinuation of Xarelto indefinitely in this 81 year old with GI bleeding. I discussed this recommendation with her sister who will discuss it with her grandchildren/POA.  Acute blood loss anemia. The patient's hemoglobin was 5.7 on admission. Her baseline hemoglobin is approximately 11-11.5. -She was transfused 2 units of packed red blood cells. Her hemoglobin improved appropriately. -We'll continue to monitor.  Chronic paroxysmal atrial fibrillation. The patient had been previously treated with Xarelto, now discontinued. Her rate has been controlled on metoprolol. -Although there is a risk of stroke off of anticoagulation, I believe in the setting of severe anemia, GI bleeding, and being 81 years old with dementia, would recommend discontinuation of anticoagulation indefinitely. Will ask GI to opine.  Ischemic cardiomyopathy/hypertension. Currently stable and appears to be compensated.  Type 2 diabetes mellitus, insulin requiring. Patient is treated with Januvia and Lantus chronically. Januvia is being held. Her blood glucoses are running on the low side of normal. -We'll change sliding scale NovoLog to sensitive scale and discontinue Lantus.  Dementia. Currently stable. Will continue Aricept.  Chronic anxiety. Currently stable, will continue Xanax when necessary.   Time spent: 30 minutes.    [facility] Pager [phone]. If 7PM-7AM, please contact night-coverage at [email or web address], password [facility] 07/07/2015, 11:58 AM  LOS: 2 days

## 2015-07-07 NOTE — Progress Notes (Signed)
Subjective:  Patient reports "not too much pain". Staff states she was comfortable but when changing her bed she complained of some back pain. No vomiting. Still with black/bloody/loose stools.   Objective: Vital signs in last 24 hours: Temp:  [98.2 F (36.8 C)-98.8 F (37.1 C)] 98.4 F (36.9 C) (08/17 0430) Pulse Rate:  [61-72] 70 (08/17 0430) Resp:  [18-20] 18 (08/17 0430) BP: (122-132)/(51-61) 132/61 mmHg (08/17 0430) SpO2:  [93 %-95 %] 95 % (08/17 0430) Last BM Date: 07/06/15 General:   Alert,  Elderly thin female, pleasant and cooperative in NAD Head:  Normocephalic and atraumatic. Eyes:  Sclera clear, no icterus.  Abdomen:  Soft, nontender and nondistended.  Normal bowel sounds, without guarding, and without rebound.   Extremities:  Without clubbing, deformity or edema. Neurologic:  Alert and  oriented to person, place. Skin:  Intact without significant lesions or rashes. Psych:  Alert and cooperative. Normal mood and affect.  Intake/Output from previous day: 08/16 0701 - 08/17 0700 In: 1093 [P.O.:600; I.V.:749] Out: 110 [Urine:110] Intake/Output this shift:    Lab Results: CBC  Recent Labs  07/06/15 0510 07/06/15 1544 07/06/15 2341  WBC 7.8 9.8 13.0*  HGB 8.4* 8.6* 8.3*  HCT 25.2* 25.6* 24.6*  MCV 89.0 89.2 88.2  PLT 169 194 191   BMET  Recent Labs  07/05/15 1535 07/06/15 0510  NA 135 136  K 3.9 3.6  CL 100* 103  CO2 28 26  GLUCOSE 258* 141*  BUN 29* 25*  CREATININE 1.07* 0.84  CALCIUM 7.8* 7.8*   LFTs  Recent Labs  07/05/15 1535 07/06/15 0510  BILITOT 0.5 1.2  ALKPHOS 59 86  AST 23 49*  ALT 16 30  PROT 5.6* 5.2*  ALBUMIN 3.1* 2.9*   No results for input(s): LIPASE in the last 72 hours. PT/INR No results for input(s): LABPROT, INR in the last 72 hours.    Imaging Studies: No results found.[2 weeks]   Assessment:  81 year old female admitted with acute blood loss anemia anemia with Hgb 5 range (historically in 11 range),  associated low-volume hematochezia in the setting of chronic Xarelto for afib. Last dose of anticoagulation on 8/14 and remains on hold since admission. Appropriate response to 2 units PRBCs. Continue to have bloody stools. Reported black and dark red, loose overnight. Spoke with nursing staff this morning, patient is passing maroon colored watery stools. She has consumed all of Golytely except for 3 cups and in process of that now. Hemodynamically, patient remains stable. Hemoglobin as remained stable.    Plan: 1. F/u next CBC. 2. Patient's procedure was cancelled for this morning because she had not completed prep. Discussed with current nurse, Janan Ridge. Continue Golytely until complete. Will add Dulcolax 10mg  now and at noon.  Possible colonoscopy +/- EGD later today.    Laureen Ochs. Bernarda Caffey [facility] [phone] 8/17/20169:34 AM     LOS: 2 days

## 2015-07-07 NOTE — Progress Notes (Addendum)
Golytely prep completed, returns of yellowish fluid, no blood noted at this time, will notify Neil Crouch, PA-C.

## 2015-07-07 NOTE — OR Nursing (Signed)
Prior to beginning procedure patient heart rate fluctuating from 38 to 66, Dr. Oneida Alar notified atropine 1mg  ordered. Heart rate increased to upper 68. After lowering side rail,  Skin lesion noted on right hand, gauze and tegrederm applied.

## 2015-07-07 NOTE — H&P (Signed)
Primary Care Physician:  Glo Herring., MD Primary Gastroenterologist:  Dr. Oneida Alar  Pre-Procedure History & Physical: HPI:  TEPHANIE ESCORCIA is a 81 y.o. female here for   Past Medical History  Diagnosis Date  . Essential hypertension, benign   . Type 2 diabetes mellitus   . Coronary atherosclerosis of native coronary artery 1999    CABG LIMA to LAD, SVG to OM1 and OM2 and SVG to PDA in 1999,   . Closed fracture of right humerus     Managed conservatively 2011  . Cerebrovascular disease   . Seasonal allergies   . Arthritis   . Vertigo   . Ischemic cardiomyopathy     45-50% 07/2013/ EF 40% per Echo 06/2011.  Marland Kitchen Near syncope   . NSTEMI (non-ST elevated myocardial infarction)     10/02/2014 & 06/2011.  . Diabetes mellitus   . Acute ischemic stroke 08/04/2013    Right MCA stroke  . Atrial fibrillation 05/28/2009    Paroxysmal    . Dyslipidemia   . Chronic anxiety   . Acute on chronic combined systolic and diastolic heart failure 83/38/2505    EF 39-76%; grade 2 diastolic dysfunction  . Dementia     Past Surgical History  Procedure Laterality Date  . Coronary artery bypass graft  1999    LIMA to LAD, SVG to OM1 and OM2, SVG to PDA  . Cataract surgery    . Abdominal hysterectomy      WITH BSO.  . Orif hip fracture Left 05/30/2014    Procedure: COMPRESSION HIP SCREW LEFT HIP;  Surgeon: Sanjuana Kava, MD;  Location: [facility];  Service: Orthopedics;  Laterality: Left;    Prior to Admission medications   Medication Sig Start Date End Date Taking? Authorizing Provider  ALPRAZolam Duanne Moron) 0.5 MG tablet Take 1 tablet (0.5 mg total) by mouth 2 (two) times daily as needed for anxiety or sleep. Patient taking differently: Take 0.5 mg by mouth 3 (three) times daily as needed for anxiety or sleep.  12/26/14  Yes Tanna Furry, MD  cetirizine (ZYRTEC) 10 MG tablet Take 10 mg by mouth daily.   Yes Historical Provider, MD  ciprofloxacin (CIPRO) 250 MG tablet Take 250 mg by mouth 2 (two) times  daily. 5 day course starting 07/02/2015   Yes Historical Provider, MD  donepezil (ARICEPT) 10 MG tablet Take 10 mg by mouth at bedtime.   Yes Historical Provider, MD  furosemide (LASIX) 20 MG tablet Take 1 tablet (20 mg total) by mouth 2 (two) times daily. For treatment of heart failure. Patient taking differently: Take 20 mg by mouth daily. For treatment of heart failure. 10/05/14  Yes Rexene Alberts, MD  HYDROcodone-acetaminophen (NORCO/VICODIN) 5-325 MG per tablet Take 1 tablet by mouth 2 (two) times daily.   Yes Historical Provider, MD  Insulin Glargine (LANTUS SOLOSTAR) 100 UNIT/ML Solostar Pen Inject 15 Units into the skin every morning.    Yes Historical Provider, MD  metoprolol (LOPRESSOR) 50 MG tablet Take 1 tablet (50 mg total) by mouth 2 (two) times daily. 06/01/14  Yes Nita Sells, MD  mirabegron ER (MYRBETRIQ) 25 MG TB24 tablet Take 25 mg by mouth daily.   Yes Historical Provider, MD  pravastatin (PRAVACHOL) 10 MG tablet Take 10 mg by mouth daily.    Yes Historical Provider, MD  Rivaroxaban (XARELTO) 15 MG TABS tablet Take 1 tablet (15 mg total) by mouth daily before breakfast. Patient taking differently: Take 15 mg by mouth daily with supper.  08/07/13  Yes Rexene Alberts, MD  sitaGLIPtin (JANUVIA) 50 MG tablet Take 50 mg by mouth every morning.   Yes Historical Provider, MD  metroNIDAZOLE (FLAGYL) 500 MG tablet Take 1 tablet (500 mg total) by mouth 3 (three) times daily. Patient not taking: Reported on 07/05/2015 05/14/15   Orpah Greek, MD  potassium chloride (K-DUR) 10 MEQ tablet Take 1 tablet (10 mEq total) by mouth 2 (two) times daily. Potassium. Take each tablet with furosemide twice daily. Patient not taking: Reported on 07/05/2015 10/05/14   Rexene Alberts, MD    Allergies as of 07/05/2015 - Review Complete 07/05/2015  Allergen Reaction Noted  . Cephalexin  07/11/2011    Family History  Problem Relation Age of Onset  . CAD Sister     Died with MI age 65  .  Pneumonia Mother   . Arthritis Other   . Coronary artery disease Other   . Diabetes Other   . Cancer Other   . Colon cancer Neg Hx     Social History   Social History  . Marital Status: Widowed    Spouse Name: N/A  . Number of Children: N/A  . Years of Education: N/A   Occupational History  . Retired    Social History Main Topics  . Smoking status: Never Smoker   . Smokeless tobacco: Never Used  . Alcohol Use: No  . Drug Use: No  . Sexual Activity: Not Currently   Other Topics Concern  . Not on file   Social History Narrative   Review of Systems: See HPI, otherwise negative ROS  Physical Exam: BP 120/55 mmHg  Pulse 62  Temp(Src) 98 F (36.7 C) (Oral)  Resp 23  Ht 5\' 5"  (1.651 m)  Wt 111 lb 1.8 oz (50.4 kg)  BMI 18.49 kg/m2  SpO2 97% General:   Alert,  Pleasant, in NAD Head:  Normocephalic and atraumatic. Neck:  Supple; Lungs:  Clear throughout to auscultation.    Heart:  irregular rate and rhythm. Abdomen:  Soft, nontender and nondistended. Normal bowel sounds, without guarding, and without rebound.   Neurologic:  Alert , NO  NEW FOCAL DEFICITS Impression/Plan:    Anemia/BRBPR  PLAN:  1. TCS/?EGD TODAY

## 2015-07-08 ENCOUNTER — Encounter (HOSPITAL_COMMUNITY): Payer: Self-pay | Admitting: Internal Medicine

## 2015-07-08 DIAGNOSIS — K6381 Dieulafoy lesion of intestine: Secondary | ICD-10-CM

## 2015-07-08 DIAGNOSIS — K635 Polyp of colon: Secondary | ICD-10-CM | POA: Diagnosis present

## 2015-07-08 DIAGNOSIS — K2970 Gastritis, unspecified, without bleeding: Secondary | ICD-10-CM | POA: Diagnosis present

## 2015-07-08 DIAGNOSIS — K311 Adult hypertrophic pyloric stenosis: Secondary | ICD-10-CM

## 2015-07-08 HISTORY — DX: Adult hypertrophic pyloric stenosis: K31.1

## 2015-07-08 HISTORY — DX: Dieulafoy lesion of intestine: K63.81

## 2015-07-08 HISTORY — DX: Polyp of colon: K63.5

## 2015-07-08 LAB — CBC
HEMATOCRIT: 28 % — AB (ref 36.0–46.0)
HEMOGLOBIN: 9 g/dL — AB (ref 12.0–15.0)
MCH: 29.4 pg (ref 26.0–34.0)
MCHC: 32.1 g/dL (ref 30.0–36.0)
MCV: 91.5 fL (ref 78.0–100.0)
Platelets: 229 10*3/uL (ref 150–400)
RBC: 3.06 MIL/uL — ABNORMAL LOW (ref 3.87–5.11)
RDW: 14.7 % (ref 11.5–15.5)
WBC: 9.3 10*3/uL (ref 4.0–10.5)

## 2015-07-08 LAB — BASIC METABOLIC PANEL
ANION GAP: 7 (ref 5–15)
BUN: 19 mg/dL (ref 6–20)
CALCIUM: 7.8 mg/dL — AB (ref 8.9–10.3)
CHLORIDE: 106 mmol/L (ref 101–111)
CO2: 26 mmol/L (ref 22–32)
Creatinine, Ser: 0.82 mg/dL (ref 0.44–1.00)
GFR calc non Af Amer: >60 mL/min (ref >60)
GLUCOSE: 69 mg/dL (ref 65–99)
Potassium: 3.1 mmol/L — ABNORMAL LOW (ref 3.5–5.1)
Sodium: 139 mmol/L (ref 135–145)

## 2015-07-08 LAB — GLUCOSE, CAPILLARY
GLUCOSE-CAPILLARY: 86 mg/dL (ref 65–99)
GLUCOSE-CAPILLARY: 137 mg/dL — AB (ref 65–99)
GLUCOSE-CAPILLARY: 61 mg/dL — AB (ref 65–99)
GLUCOSE-CAPILLARY: 177 mg/dL — AB (ref 65–99)
GLUCOSE-CAPILLARY: 228 mg/dL — AB (ref 65–99)
Glucose-Capillary: 159 mg/dL — ABNORMAL HIGH (ref 65–99)

## 2015-07-08 LAB — MAGNESIUM: MAGNESIUM: 1.9 mg/dL (ref 1.7–2.4)

## 2015-07-08 MED ORDER — DEXTROSE 50 % IV SOLN
INTRAVENOUS | Status: AC
  Administered 2015-07-08: 08:00:00
  Filled 2015-07-08: qty 50

## 2015-07-08 MED ORDER — FUROSEMIDE 20 MG PO TABS
20.0000 mg | ORAL_TABLET | Freq: Two times a day (BID) | ORAL | Status: DC
  Administered 2015-07-08 – 2015-07-10 (×4): 20 mg via ORAL
  Filled 2015-07-08 (×4): qty 1

## 2015-07-08 MED ORDER — POTASSIUM CHLORIDE CRYS ER 20 MEQ PO TBCR
20.0000 meq | EXTENDED_RELEASE_TABLET | Freq: Two times a day (BID) | ORAL | Status: AC
  Administered 2015-07-08 – 2015-07-09 (×2): 20 meq via ORAL
  Filled 2015-07-08 (×2): qty 1

## 2015-07-08 MED ORDER — POTASSIUM CHLORIDE 10 MEQ/100ML IV SOLN
10.0000 meq | INTRAVENOUS | Status: AC
  Administered 2015-07-08 (×3): 10 meq via INTRAVENOUS
  Filled 2015-07-08 (×3): qty 100

## 2015-07-08 MED ORDER — KCL IN DEXTROSE-NACL 40-5-0.9 MEQ/L-%-% IV SOLN
INTRAVENOUS | Status: DC
  Administered 2015-07-08 – 2015-07-09 (×2): via INTRAVENOUS
  Filled 2015-07-08 (×7): qty 1000

## 2015-07-08 NOTE — Progress Notes (Signed)
Subjective:  Feels better today. Denies abdominal pain. Denies further GI bleeding. Tolerating regular diet.  Objective: Vital signs in last 24 hours: Temp:  [97.7 F (36.5 C)-98 F (36.7 C)] 98 F (36.7 C) (08/18 0913) Pulse Rate:  [60-117] 117 (08/18 0921) Resp:  [20-23] 20 (08/18 0913) BP: (111-147)/(54-111) 126/111 mmHg (08/18 0913) SpO2:  [94 %-100 %] 100 % (08/18 0913) Last BM Date: 07/07/15 General:   Alert,  Well-developed, well-nourished, pleasant and cooperative in NAD Head:  Normocephalic and atraumatic. Eyes:  Sclera clear, no icterus. Abdomen:  Soft, nontender and nondistended. Extremities:  Without clubbing, deformity or edema. Neurologic:  Alert and oriented to person, place. Skin:  Intact without significant lesions or rashes. Psych:  Alert and cooperative. Normal mood and affect.  Intake/Output from previous day:   Intake/Output this shift: Total I/O In: 240 [P.O.:240] Out: -   Lab Results: CBC  Recent Labs  07/06/15 2341 07/07/15 0925 07/08/15 0615  WBC 13.0* 9.4 9.3  HGB 8.3* 8.9* 9.0*  HCT 24.6* 26.8* 28.0*  MCV 88.2 89.6 91.5  PLT 191 216 229   BMET  Recent Labs  07/06/15 0510 07/07/15 0921 07/08/15 0615  NA 136 138 139  K 3.6 3.4* 3.1*  CL 103 104 106  CO2 26 24 26   GLUCOSE 141* 118* 69  BUN 25* 18 19  CREATININE 0.84 0.80 0.82  CALCIUM 7.8* 7.9* 7.8*   LFTs  Recent Labs  07/05/15 1535 07/06/15 0510  BILITOT 0.5 1.2  ALKPHOS 59 86  AST 23 49*  ALT 16 30  PROT 5.6* 5.2*  ALBUMIN 3.1* 2.9*   No results for input(s): LIPASE in the last 72 hours. PT/INR No results for input(s): LABPROT, INR in the last 72 hours.    Imaging Studies: No results found.[2 weeks]   Assessment: 81 year old female admitted with acute blood loss anemia anemia with Hgb 5 range (historically in 11 range), associated low-volume hematochezia in the setting of chronic Xarelto for afib. Last dose of anticoagulation on 8/14 and remains on hold since  admission. Appropriate response to 2 units PRBCs and has remained stable. No further bleeding since her procedures yesterday. Noted to have patent stricture at the GE junction, inflammation the stomach lining status post biopsy, pyloric stenosis with some trauma with passage of scope. Visible vessel at the cecum with blood clot status post 2 clips (Diefulafoy's lesion), several rectal and descending colon polyps removed, small internal hemorrhoids.   Spoke with nursing staff this morning, no further GI bleeding noted.   Plan: 1. Hold Xarelto until 07/17/15 2. Follow-up pending biopsies.  Laureen Ochs. Bernarda Caffey [facility] [phone] 8/18/201610:44 AM     LOS: 3 days    Attending note:  Patient seen and examined. Agree with above assessment and recommendations.

## 2015-07-08 NOTE — Progress Notes (Addendum)
Hypoglycemic Event  CBG: 61  Treatment: D50 IV 25 mL  Symptoms: Shaky and vomiting  Follow-up CBG: Time:0820 CBG Result:137  Possible Reasons for Event: Inadequate meal intake and Other: npo for procedure yesterday  Comments/MD notified:Dr. Milana Na  Remember to initiate Hypoglycemia Order Set & complete

## 2015-07-08 NOTE — Op Note (Signed)
[facility] [address] [place], [address]   COLONOSCOPY PROCEDURE REPORT  PATIENT: Bera, Pinela  MR#: [id number] BIRTHDATE: 01/22/1922 , 89  yrs. old GENDER: female ENDOSCOPIST: Danie Binder, MD REFERRED QQ:[id number] Gerarda Fraction, M.D.  Linton Ham, M.D.  Rozann Lesches, M.D.  Garfield Cornea, M.D. PROCEDURE DATE:  22-Jul-2015 PROCEDURE:   Colonoscopy with snare polypectomy, Colonoscopy with cold biopsy polypectomy, and Colonoscopy with control of bleeding INDICATIONS:hematochezia and anemia, non-specific. MEDICATIONS: Demerol 50 mg IV and Versed 2 mg IV  DESCRIPTION OF PROCEDURE:    Physical exam was performed.  Informed consent was obtained from the patient after explaining the benefits, risks, and alternatives to procedure.  The patient was connected to monitor and placed in left lateral position. Continuous oxygen was provided by nasal cannula and IV medicine administered through an indwelling cannula.  After administration of sedation and rectal exam, the patients rectum was intubated and the     colonoscope was advanced under direct visualization to the ileum.  The scope was removed slowly by carefully examining the color, texture, anatomy, and integrity mucosa on the way out.  The patient was recovered in endoscopy and discharged home in satisfactory condition. Estimated blood loss is zero unless otherwise noted in this procedure report.    COLON FINDINGS: The colon was redundant.  Manual abdominal counter-pressure was used to reach the cecum, The examined terminal ileum appeared to be normal. NO FRESH OR OLD BLODO IN ILEUM. VISIBLE VESSEL IN CECUM WITH ADHERENT BLOOD CLOT AND SMALL AMOUNT OF CLOT/FRESH BLOOD IN CECUM.  2 CLIPS APPLIED, Three sessile polyps ranging from 6 to 55mm(RECTUM) in size were found in the rectum and at the cecum.  A polypectomy was performed using snare cautery.  , A sessile polyp measuring 3 mm in size was found in the ascending  colon.  A polypectomy was performed with cold forceps.  , and Small internal hemorrhoids were found.  PREP QUALITY: excellent.  CECAL W/D TIME: 25       minutes COMPLICATIONS: None  ENDOSCOPIC IMPRESSION: 1.   The LEFT colon IS was redundant 2.   LOWER GI BLEED DUE TO DIEULAFOY'S LESION IN CECUM 3.   FOUR COLORECTAL POLYPS REMOVED 4.   Small internal hemorrhoids  RECOMMENDATIONS: HOLD XARELTO.  RE-START AUG 27. ADVANCE DIET AWAIT BIOPSY   _______________________________ eSignedDanie Binder, MD Jul 22, 2015 4:00 PM   CPT CODES: ICD CODES:  The ICD and CPT codes recommended by this software are interpretations from the data that the clinical staff has captured with the software.  The verification of the translation of this report to the ICD and CPT codes and modifiers is the sole responsibility of the health care institution and practicing physician where this report was generated.  [employer] will not be held responsible for the validity of the ICD and CPT codes included on this report.  AMA assumes no liability for data contained or not contained herein. CPT is a Designer, television/film set of the [employer].

## 2015-07-08 NOTE — Progress Notes (Addendum)
PT Cancellation Note  Patient Details Name: NOVELLA ABRAHA MRN: [id number] DOB: [birth year]   Cancelled Treatment:    Reason Eval/Treat Not Completed: Fatigue/lethargy limiting ability to participate.  Pt lives in ACLF memory unit and ambulates with a walker.  I could not convince her to work with me on PT eval/getting up out of bed.  "I just don't feel up to it". Will try again tomorrow.   Demetrios Isaacs L 07/08/2015, 2:55 PM

## 2015-07-08 NOTE — Progress Notes (Signed)
[facility] PROGRESS NOTE  Alicia Mason YYT:[id number] DOB: 1922/07/11 DOA: 07/05/2015 PCP: Glo Herring., MD    Code Status: DO NOT RESUSCITATE Family Communication: Discussed with patient's sister, [name] on 8/17. Disposition Plan: Discharge when clinically appropriate.   Consultants:  Gastroenterology  Procedures: 1.EGD 07/07/15:ENDOSCOPIC IMPRESSION:  PATENT stricture at the gastroesophageal junction;  MILD Non-erosive gastritis; PYLORIC STENOSIS 2. Colonoscopy 07/07/15: The LEFT colon IS was redundant; LOWER GI BLEED DUE TO DIEULAFOY'S LESION IN CECUM; FOUR COLORECTAL POLYPS REMOVED; Small internal hemorrhoids  Antibiotics:  None  HPI/Subjective: Patient has no complaints of abdominal pain, nausea, and vomiting currently. Apparently she had a low blood sugar became shaky and had one episode of emesis per nursing.  Objective: Filed Vitals:   07/08/15 0921  BP:   Pulse: 117  Temp:   Resp:    temperature 90.8. Pulse 117. Respiratory rate 20. Blood pressure 126/111. Oxygen saturation 100%.  Intake/Output Summary (Last 24 hours) at 07/08/15 1224 Last data filed at 07/08/15 0955  Gross per 24 hour  Intake    240 ml  Output      0 ml  Net    240 ml   Filed Weights   07/05/15 1432 07/05/15 2006  Weight: 45.36 kg (100 lb) 50.4 kg (111 lb 1.8 oz)    Exam:   General:  Elderly 81 year old woman in no acute distress.  Cardiovascular: Irregular, irregular with mild tachycardia.  Respiratory: Lungs clear anteriorly. Breathing nonlabored.  Abdomen: Positive bowel sounds, soft, nontender, nondistended.  Musculoskeletal/extremities: No pedal edema. No acute hot red joints.  Neurologic: She is alert and oriented to herself, hospital. Cranial nerves II through XII are grossly intact.   Data Reviewed: Basic Metabolic Panel:  Recent Labs Lab 07/05/15 1535 07/06/15 0510 07/07/15 0921 07/08/15 0615  NA 135 136 138 139  K 3.9 3.6 3.4* 3.1*  CL 100*  103 104 106  CO2 28 26 24 26   GLUCOSE 258* 141* 118* 69  BUN 29* 25* 18 19  CREATININE 1.07* 0.84 0.80 0.82  CALCIUM 7.8* 7.8* 7.9* 7.8*   Liver Function Tests:  Recent Labs Lab 07/05/15 1535 07/06/15 0510  AST 23 49*  ALT 16 30  ALKPHOS 59 86  BILITOT 0.5 1.2  PROT 5.6* 5.2*  ALBUMIN 3.1* 2.9*   No results for input(s): LIPASE, AMYLASE in the last 168 hours. No results for input(s): AMMONIA in the last 168 hours. CBC:  Recent Labs Lab 07/05/15 1535 07/06/15 0510 07/06/15 1544 07/06/15 2341 07/07/15 0925 07/08/15 0615  WBC 7.6 7.8 9.8 13.0* 9.4 9.3  NEUTROABS 5.0  --   --   --   --   --   HGB 5.7* 8.4* 8.6* 8.3* 8.9* 9.0*  HCT 17.0* 25.2* 25.6* 24.6* 26.8* 28.0*  MCV 91.4 89.0 89.2 88.2 89.6 91.5  PLT 188 169 194 191 216 229   Cardiac Enzymes: No results for input(s): CKTOTAL, CKMB, CKMBINDEX, TROPONINI in the last 168 hours. BNP (last 3 results) No results for input(s): BNP in the last 8760 hours.  ProBNP (last 3 results)  Recent Labs  10/01/14 0801  PROBNP 3877.0*    CBG:  Recent Labs Lab 07/07/15 1402 07/07/15 2259 07/08/15 0737 07/08/15 0822 07/08/15 1131  GLUCAP 112* 70 61* 137* 159*    No results found for this or any previous visit (from the past 240 hour(s)).   Studies: No results found.  Scheduled Meds: . donepezil  10 mg Oral QHS  . HYDROcodone-acetaminophen  1 tablet  Oral BID  . insulin aspart  0-5 Units Subcutaneous QHS  . insulin aspart  0-9 Units Subcutaneous TID WC  . loratadine  10 mg Oral Daily  . metoprolol  50 mg Oral BID  . mirabegron ER  25 mg Oral Daily  . pantoprazole  40 mg Oral BID AC  . potassium chloride  10 mEq Intravenous Q1 Hr x 3  . pravastatin  10 mg Oral q1800  . sodium chloride  3 mL Intravenous Q12H   Continuous Infusions: . sodium chloride Stopped (07/06/15 1030)   Assessment and plan:  Principal Problem:   Bleeding gastrointestinal Active Problems:   Anemia due to blood loss, acute   Rectal  bleeding   Dieulafoy lesion of colon   Ischemic cardiomyopathy   Atrial fibrillation   DM type 2 (diabetes mellitus, type 2)   Essential hypertension   Colon polyps   Gastritis   Pyloric stenosis   1. GI bleeding in the setting of anticoagulation on Xarelto; due to Dieulafoy's lesion in the cecum On admission, the patient complained of red to black rectal bleeding. -Xarelto was discontinued. She was started on IV Protonix. -Gastroenterology was consulted. Dr. Oneida Alar performed an EGD and colonoscopy on 07/07/15. The results were significant for a mild stricture at the GE junction; gastritis-status post biopsy; pyloric stenosis; visible vessel at the cecum secondary to Dieulafoy lesion-status post 2 clips; colon polyps-status post polypectomy, and internal hemorrhoids. -Dr. Oneida Alar recommended holding the Xarelto until 07/17/15. However, I would recommend discontinuation of Xarelto indefinitely in this 81 year old with GI bleeding. I discussed this recommendation with her sister who will discuss it with her grandchildren/POA.  Acute blood loss anemia. The patient's hemoglobin was 5.7 on admission. Her baseline hemoglobin is approximately 11-11.5. -She was transfused 2 units of packed red blood cells. Her hemoglobin improved appropriately. -We'll continue to monitor.  Chronic paroxysmal atrial fibrillation. The patient had been previously treated with Xarelto, now discontinued. She is on metoprolol chronically for rate control. -Her heart rate has trended up some; consider adding diltiazem or titrating the metoprolol up. -Continue to hold Xarelto, possibly indefinitely.  Ischemic cardiomyopathy/hypertension. Currently stable and appears to be compensated.  Type 2 diabetes mellitus, insulin requiring. Patient is treated with Januvia and Lantus chronically. Januvia is being held. Her blood glucoses are running on the low side of normal on 8/17, so Lantus was discontinued and sliding scale  NovoLog was changed to sensitive scale. -Patient had an asymptomatic hypoglycemic episode which was treated appropriately. -We'll add gentle dextrose infusion.  Hypokalemia. Patient's serum potassium was within normal limits on admission. It has fallen to 3.1, likely secondary to Lasix given with the blood transfusions. -We'll give several IV runs of potassium chloride. We'll try to give potassium orally if she can tolerate it. -We'll order a magnesium level to rule out deficiency.  Dementia. Currently stable. Will continue Aricept.  Chronic anxiety. Currently stable, will continue Xanax when necessary.   Time spent: 30 minutes.    [place] [facility] Pager [phone]. If 7PM-7AM, please contact night-coverage at [email or web address], password [facility] 07/08/2015, 12:24 PM  LOS: 3 days

## 2015-07-08 NOTE — Op Note (Signed)
[facility] [address] [place], [address]   ENDOSCOPY PROCEDURE REPORT  PATIENT: Alicia, Mason  MR#: [id number] BIRTHDATE: 09-04-1922 , 89  yrs. old GENDER: female  ENDOSCOPIST: Danie Binder, MD REFERRED GV:[id number] Gerarda Fraction, M.D.  Linton Ham, M.D.  Rozann Lesches, M.D.  Garfield Cornea, M.D.  PROCEDURE DATE: Jul 19, 2015 PROCEDURE:   EGD w/ biopsy  INDICATIONS:anemia and marroon stools. MEDICATIONS: TCS + none TOPICAL ANESTHETIC:   Viscous Xylocaine ASA CLASS:  DESCRIPTION OF PROCEDURE:     Physical exam was performed.  Informed consent was obtained from the patient after explaining the benefits, risks, and alternatives to the procedure.  The patient was connected to the monitor and placed in the left lateral position.  Continuous oxygen was provided by nasal cannula and IV medicine administered through an indwelling cannula.  After administration of sedation, the patients esophagus was intubated and the     endoscope was advanced under direct visualization to the second portion of the duodenum.  The scope was removed slowly by carefully examining the color, texture, anatomy, and integrity of the mucosa on the way out.  The patient was recovered in endoscopy and discharged home in satisfactory condition.  Estimated blood loss is zero unless otherwise noted in this procedure report.     ESOPHAGUS: There was a stricture at the gastroesophageal junction. The stricture was easily traversable.   STOMACH: Mild non-erosive gastritis (inflammation) was found in the gastric antrum and gastric body.  Multiple biopsies were performed using cold forceps. MET RESISTANCE WITH PASSING DIAGNOSTIC GASTROSCOPE THROUGH PYLORUS.  SMALL AMOUNT OF BLEEDING NOTED AFTER ADVANCING SCOPE. BIOPSY SITE IN GASTRIC BODY CONTINUED TO OOZE.  ONE METAL CLIP PLACED. HEMOSTASIS ACHEIVED.   DUODENUM: The duodenal mucosa showed no abnormalities in the bulb and 2nd part of the  duodenum. COMPLICATIONS: There were no immediate complications.  ENDOSCOPIC IMPRESSION: 1.   PATENT stricture at the gastroesophageal junction 2.   MILD Non-erosive gastritis 3.   PYLORIC STENOSIS  RECOMMENDATIONS: AWAIT BIOPSY. ADVANCE DIET NO MRI FOR 30 DAYS HOLD XARELTO.  RE-START AUG 27.  REPEAT EXAM: eSigned:  Danie Binder, MD 07-19-15 4:06 PM  CPT CODES: ICD CODES:  The ICD and CPT codes recommended by this software are interpretations from the data that the clinical staff has captured with the software.  The verification of the translation of this report to the ICD and CPT codes and modifiers is the sole responsibility of the health care institution and practicing physician where this report was generated.  [place]. will not be held responsible for the validity of the ICD and CPT codes included on this report.  AMA assumes no liability for data contained or not contained herein. CPT is a Designer, television/film set of the [employer].

## 2015-07-09 ENCOUNTER — Encounter (HOSPITAL_COMMUNITY): Payer: Self-pay | Admitting: Gastroenterology

## 2015-07-09 ENCOUNTER — Inpatient Hospital Stay (HOSPITAL_COMMUNITY): Payer: Medicare Other

## 2015-07-09 DIAGNOSIS — K6381 Dieulafoy lesion of intestine: Principal | ICD-10-CM

## 2015-07-09 DIAGNOSIS — K635 Polyp of colon: Secondary | ICD-10-CM

## 2015-07-09 DIAGNOSIS — D500 Iron deficiency anemia secondary to blood loss (chronic): Secondary | ICD-10-CM

## 2015-07-09 DIAGNOSIS — K2970 Gastritis, unspecified, without bleeding: Secondary | ICD-10-CM

## 2015-07-09 DIAGNOSIS — I4891 Unspecified atrial fibrillation: Secondary | ICD-10-CM

## 2015-07-09 LAB — BASIC METABOLIC PANEL
ANION GAP: 8 (ref 5–15)
BUN: 21 mg/dL — ABNORMAL HIGH (ref 6–20)
CALCIUM: 8 mg/dL — AB (ref 8.9–10.3)
CO2: 23 mmol/L (ref 22–32)
Chloride: 104 mmol/L (ref 101–111)
Creatinine, Ser: 0.98 mg/dL (ref 0.44–1.00)
GFR, EST AFRICAN AMERICAN: 57 mL/min — AB (ref >60)
GFR, EST NON AFRICAN AMERICAN: 49 mL/min — AB (ref >60)
Glucose, Bld: 253 mg/dL — ABNORMAL HIGH (ref 65–99)
POTASSIUM: 4.4 mmol/L (ref 3.5–5.1)
SODIUM: 135 mmol/L (ref 135–145)

## 2015-07-09 LAB — GLUCOSE, CAPILLARY
GLUCOSE-CAPILLARY: 201 mg/dL — AB (ref 65–99)
GLUCOSE-CAPILLARY: 226 mg/dL — AB (ref 65–99)
Glucose-Capillary: 257 mg/dL — ABNORMAL HIGH (ref 65–99)
Glucose-Capillary: 215 mg/dL — ABNORMAL HIGH (ref 65–99)

## 2015-07-09 LAB — URINALYSIS, ROUTINE W REFLEX MICROSCOPIC
BILIRUBIN URINE: NEGATIVE
GLUCOSE, UA: 250 mg/dL — AB
HGB URINE DIPSTICK: NEGATIVE
Ketones, ur: NEGATIVE mg/dL
Leukocytes, UA: NEGATIVE
Nitrite: NEGATIVE
Protein, ur: NEGATIVE mg/dL
SPECIFIC GRAVITY, URINE: 1.015 (ref 1.005–1.030)
UROBILINOGEN UA: 0.2 mg/dL (ref 0.0–1.0)
pH: 5.5 (ref 5.0–8.0)

## 2015-07-09 LAB — CBC
HCT: 29.7 % — ABNORMAL LOW (ref 36.0–46.0)
Hemoglobin: 9.4 g/dL — ABNORMAL LOW (ref 12.0–15.0)
MCH: 29.5 pg (ref 26.0–34.0)
MCHC: 31.6 g/dL (ref 30.0–36.0)
MCV: 93.1 fL (ref 78.0–100.0)
PLATELETS: 290 10*3/uL (ref 150–400)
RBC: 3.19 MIL/uL — AB (ref 3.87–5.11)
RDW: 14.8 % (ref 11.5–15.5)
WBC: 12.3 10*3/uL — AB (ref 4.0–10.5)

## 2015-07-09 LAB — MAGNESIUM: MAGNESIUM: 2 mg/dL (ref 1.7–2.4)

## 2015-07-09 MED ORDER — INSULIN GLARGINE 100 UNIT/ML ~~LOC~~ SOLN
5.0000 [IU] | Freq: Two times a day (BID) | SUBCUTANEOUS | Status: DC
  Administered 2015-07-09 – 2015-07-10 (×2): 5 [IU] via SUBCUTANEOUS
  Filled 2015-07-09 (×6): qty 0.05

## 2015-07-09 MED ORDER — INSULIN GLARGINE 100 UNIT/ML ~~LOC~~ SOLN
7.0000 [IU] | Freq: Two times a day (BID) | SUBCUTANEOUS | Status: DC
  Administered 2015-07-09: 7 [IU] via SUBCUTANEOUS
  Filled 2015-07-09 (×7): qty 0.07

## 2015-07-09 MED ORDER — ALPRAZOLAM 0.5 MG PO TABS
0.5000 mg | ORAL_TABLET | Freq: Three times a day (TID) | ORAL | Status: DC | PRN
  Administered 2015-07-09: 0.5 mg via ORAL
  Filled 2015-07-09 (×2): qty 1

## 2015-07-09 NOTE — Clinical Social Work Note (Signed)
CSW updated Tammy at [place] on pt. Aware of possible weekend d/c and recommendation for home health PT which facility will do in house.  Benay Pike, Tamms

## 2015-07-09 NOTE — Care Management Note (Signed)
Case Management Note  Patient Details  Name: Alicia Mason MRN: [id number] Date of Birth: 1922-02-19  Subjective/Objective:                    Action/Plan:   Expected Discharge Date:  07/07/15               Expected Discharge Plan:  Assisted Living / Rest Home  In-House Referral:  Clinical Social Work  Discharge planning Services  CM Consult  Post Acute Care Choice:  NA Choice offered to:  NA  DME Arranged:    DME Agency:     HH Arranged:    [place] Agency:     Status of Service:  Completed, signed off  Medicare Important Message Given:  Yes-second notification given Date Medicare IM Given:    Medicare IM give by:    Date Additional Medicare IM Given:    Additional Medicare Important Message give by:     If discussed at [place] of Stay Meetings, dates discussed:    Additional Comments: Pt had increase of WBC. Repeating CXR and UA to r/o any new infection. If neg will discharge back to [facility] with inhouse PT. CSW to arrange discharge when medically stable. Christinia G[place], RN 07/09/2015, 12:37 PM

## 2015-07-09 NOTE — Progress Notes (Signed)
Inpatient Diabetes Program Recommendations  AACE/ADA: New Consensus Statement on Inpatient Glycemic Control (2013)  Target Ranges:  Prepandial:   less than 140 mg/dL      Peak postprandial:   less than 180 mg/dL (1-2 hours)      Critically ill patients:  140 - 180 mg/dL   Results for Alicia Mason, Alicia Mason (MRN [id number]) as of 07/09/2015 09:52  Ref. Range 07/08/2015 08:22 07/08/2015 11:31 07/08/2015 16:58 07/08/2015 21:16 07/09/2015 07:48  Glucose-Capillary Latest Ref Range: 65-99 mg/dL 137 (H) 159 (H) 177 (H) 228 (H) 201 (H)    Current orders for Inpatient glycemic control: Novolog 0-9 units TID with meals, Novolog 0-5 units HS  Inpatient Diabetes Program Recommendations Insulin - Basal: May want to consider reordering Lantus at a lower dose; recommend starting with Lantus 5 units daily.  Thanks, Barnie Alderman, RN, MSN, CCRN, CDE Diabetes Coordinator Inpatient Diabetes Program [phone] (Team Pager from [place] to [place]) [phone] ([facility] office) [phone] [facility] office) [phone] [facility] office)

## 2015-07-09 NOTE — Care Management Important Message (Signed)
Important Message  Patient Details  Name: Alicia Mason MRN: [id number] Date of Birth: Dec 22, 1921   Medicare Important Message Given:  Yes-second notification given    Joylene Draft, RN 07/09/2015, 12:36 PM

## 2015-07-09 NOTE — Progress Notes (Signed)
    Subjective: Feeling well today, tolerating diet well. No abdominal pain, chest pain, dyspnea, syncope. No further GI bleed noted (per patient and per staff/notes).  Objective: Vital signs in last 24 hours: Temp:  [97.5 F (36.4 C)-98.9 F (37.2 C)] 97.5 F (36.4 C) (08/19 0456) Pulse Rate:  [61-111] 61 (08/19 0913) Resp:  [20] 20 (08/19 0456) BP: (128-146)/(56-66) 146/58 mmHg (08/19 0456) SpO2:  [97 %-98 %] 98 % (08/19 0456) Last BM Date: 07/08/15 General:   Alert and oriented, pleasant, sitting in chair eating lunch. Head:  Normocephalic and atraumatic. Eyes:  No icterus, sclera clear. Conjuctiva pink.  Heart:  S1, S2 present, no murmurs noted.  Lungs: Clear to auscultation bilaterally, without wheezing, rales, or rhonchi.  Abdomen:  Bowel sounds present, soft, non-tender, non-distended. No HSM or hernias noted. No rebound or guarding. No masses appreciated  Extremities:  Without edema. Neurologic:  Alert and  oriented; grossly normal neurologically. Skin:  Warm and dry, intact without significant lesions.  Psych:  Alert and cooperative. Normal mood and affect.  Intake/Output from previous day: 08/18 0701 - 08/19 0700 In: 1146.5 [P.O.:480; I.V.:366.5; IV Piggyback:300] Out: 350 [Urine:250; Stool:100] Intake/Output this shift: Total I/O In: 1083.7 [P.O.:240; I.V.:843.7] Out: 150 [Urine:150]  Lab Results:  Recent Labs  07/07/15 0925 07/08/15 0615 07/09/15 0435  WBC 9.4 9.3 12.3*  HGB 8.9* 9.0* 9.4*  HCT 26.8* 28.0* 29.7*  PLT 216 229 290   BMET  Recent Labs  07/07/15 0921 07/08/15 0615 07/09/15 0435  NA 138 139 135  K 3.4* 3.1* 4.4  CL 104 106 104  CO2 24 26 23   GLUCOSE 118* 69 253*  BUN 18 19 21*  CREATININE 0.80 0.82 0.98  CALCIUM 7.9* 7.8* 8.0*   LFT No results for input(s): PROT, ALBUMIN, AST, ALT, ALKPHOS, BILITOT, BILIDIR, IBILI in the last 72 hours. PT/INR No results for input(s): LABPROT, INR in the last 72 hours. Hepatitis Panel No  results for input(s): HEPBSAG, HCVAB, HEPAIGM, HEPBIGM in the last 72 hours.  Studies/Results: No results found.  Assessment: 81 year old female admitted with acute blood loss anemia anemia with Hgb 5 range (historically in 11 range), associated low-volume hematochezia in the setting of chronic Xarelto for afib. Last dose of anticoagulation on 8/14 and remains on hold since admission. Appropriate response to 2 units PRBCs and has remained stable. No further bleeding since her procedures yesterday. Noted to have patent stricture at the GE junction, inflammation the stomach lining status post biopsy, pyloric stenosis with some trauma with passage of scope. Visible vessel at the cecum with blood clot status post 2 clips (Diefulafoy's lesion), several rectal and descending colon polyps removed, small internal hemorrhoids.   Doing well today, no further bleeding. Essentially asymptomatic from a GI standpoint. Is wanting to go home "to go fishing." H/H is improving/stable, Hgb 9.4 today.    Plan: 1. Continued supportive measures. 2. Continue to montior for any recurrent GI bleeding. 3. Outpatient GI follow-up 4. We will sign off, please let us know if there are any additional GI needs.   Walden Field, AGNP-C Adult & Gerontological Nurse Practitioner [facility]    LOS: 4 days    07/09/2015, 1:38 PM

## 2015-07-09 NOTE — Progress Notes (Signed)
[facility] PROGRESS NOTE  Alicia Mason ZDG:[id number] DOB: [birth year] DOA: 07/05/2015 PCP: Glo Herring., MD    Code Status: DO NOT RESUSCITATE Family Communication: Discussed with patient's sister, [name] on 8/17. Disposition Plan: Discharge when clinically appropriate, hopefully in the next 24-48 hours.   Consultants:  Gastroenterology  Procedures: 1.EGD 07/07/15:ENDOSCOPIC IMPRESSION:  PATENT stricture at the gastroesophageal junction;  MILD Non-erosive gastritis; PYLORIC STENOSIS 2. Colonoscopy 07/07/15: The LEFT colon IS was redundant; LOWER GI BLEED DUE TO DIEULAFOY'S LESION IN CECUM; FOUR COLORECTAL POLYPS REMOVED; Small internal hemorrhoids  Antibiotics:  None  HPI/Subjective: Patient has no complaints of abdominal pain, nausea, vomiting, cough, or diarrhea.  Objective: Filed Vitals:   07/09/15 0913  BP:   Pulse: 61  Temp:   Resp:    temperature 97.5. Pulse 61. Respiratory rate 20. Blood pressure 146/58.  Intake/Output Summary (Last 24 hours) at 07/09/15 1420 Last data filed at 07/09/15 0900  Gross per 24 hour  Intake 1990.17 ml  Output    250 ml  Net 1740.17 ml   Filed Weights   07/05/15 1432 07/05/15 2006  Weight: 45.36 kg (100 lb) 50.4 kg (111 lb 1.8 oz)    Exam:   General:  Elderly 81 year old woman in no acute distress.  Cardiovascular: Irregular, irregular with soft systolic murmur.  Respiratory: Lungs clear anteriorly. Breathing nonlabored.  Abdomen: Positive bowel sounds, soft, nontender, nondistended.  Musculoskeletal/extremities: No pedal edema. No acute hot red joints.  Neurologic: She is alert and oriented to herself, hospital. Cranial nerves II through XII are grossly intact.   Data Reviewed: Basic Metabolic Panel:  Recent Labs Lab 07/05/15 1535 07/06/15 0510 07/07/15 0921 07/08/15 0615 07/09/15 0435  NA 135 136 138 139 135  K 3.9 3.6 3.4* 3.1* 4.4  CL 100* 103 104 106 104  CO2 28 26 24 26 23   GLUCOSE  258* 141* 118* 69 253*  BUN 29* 25* 18 19 21*  CREATININE 1.07* 0.84 0.80 0.82 0.98  CALCIUM 7.8* 7.8* 7.9* 7.8* 8.0*  MG  --   --   --  1.9 2.0   Liver Function Tests:  Recent Labs Lab 07/05/15 1535 07/06/15 0510  AST 23 49*  ALT 16 30  ALKPHOS 59 86  BILITOT 0.5 1.2  PROT 5.6* 5.2*  ALBUMIN 3.1* 2.9*   No results for input(s): LIPASE, AMYLASE in the last 168 hours. No results for input(s): AMMONIA in the last 168 hours. CBC:  Recent Labs Lab 07/05/15 1535  07/06/15 1544 07/06/15 2341 07/07/15 0925 07/08/15 0615 07/09/15 0435  WBC 7.6  < > 9.8 13.0* 9.4 9.3 12.3*  NEUTROABS 5.0  --   --   --   --   --   --   HGB 5.7*  < > 8.6* 8.3* 8.9* 9.0* 9.4*  HCT 17.0*  < > 25.6* 24.6* 26.8* 28.0* 29.7*  MCV 91.4  < > 89.2 88.2 89.6 91.5 93.1  PLT 188  < > 194 191 216 229 290  < > = values in this interval not displayed. Cardiac Enzymes: No results for input(s): CKTOTAL, CKMB, CKMBINDEX, TROPONINI in the last 168 hours. BNP (last 3 results) No results for input(s): BNP in the last 8760 hours.  ProBNP (last 3 results)  Recent Labs  10/01/14 0801  PROBNP 3877.0*    CBG:  Recent Labs Lab 07/08/15 1131 07/08/15 1658 07/08/15 2116 07/09/15 0748 07/09/15 1155  GLUCAP 159* 177* 228* 201* 226*    No results found for this or  any previous visit (from the past 240 hour(s)).   Studies: No results found.  Scheduled Meds: . donepezil  10 mg Oral QHS  . furosemide  20 mg Oral BID  . HYDROcodone-acetaminophen  1 tablet Oral BID  . insulin aspart  0-5 Units Subcutaneous QHS  . insulin aspart  0-9 Units Subcutaneous TID WC  . insulin glargine  7 Units Subcutaneous BID  . loratadine  10 mg Oral Daily  . metoprolol  50 mg Oral BID  . mirabegron ER  25 mg Oral Daily  . pantoprazole  40 mg Oral BID AC  . pravastatin  10 mg Oral q1800  . sodium chloride  3 mL Intravenous Q12H   Continuous Infusions: . dextrose 5 % and 0.9 % NaCl with KCl 40 mEq/L 65 mL/hr at 07/09/15  1235   Assessment and plan:  Principal Problem:   Bleeding gastrointestinal Active Problems:   Anemia due to blood loss, acute   Rectal bleeding   Dieulafoy lesion of colon   Ischemic cardiomyopathy   Atrial fibrillation   DM type 2 (diabetes mellitus, type 2)   Essential hypertension   Colon polyps   Gastritis   Pyloric stenosis   1. GI bleeding in the setting of anticoagulation on Xarelto; due to Dieulafoy's lesion in the cecum On admission, the patient complained of red to black rectal bleeding. -Xarelto was discontinued. She was started on IV Protonix. -Gastroenterology was consulted. Dr. Oneida Alar performed an EGD and colonoscopy on 07/07/15. The results were significant for a mild stricture at the GE junction; gastritis-status post biopsy; pyloric stenosis; visible vessel at the cecum secondary to Dieulafoy lesion-status post 2 clips; colon polyps-status post polypectomy, and internal hemorrhoids. -Dr. Oneida Alar recommended holding the Xarelto until 07/17/15. However, I would recommend discontinuation of Xarelto indefinitely in this 81 year old with GI bleeding. I discussed this recommendation with her sister who will discuss it with her grandchildren/POA.  Acute blood loss anemia. The patient's hemoglobin was 5.7 on admission. Her baseline hemoglobin is approximately 11-11.5. -She was transfused 2 units of packed red blood cells. Her hemoglobin improved appropriately. -We'll continue to monitor.  Chronic paroxysmal atrial fibrillation. The patient had been previously treated with Xarelto, now discontinued. She is on metoprolol chronically for rate control. -Her heart rate did trend up some on 8/19, but is well controlled now.  -Continue to hold Xarelto, possibly indefinitely.  Ischemic cardiomyopathy/hypertension. Currently stable and appears to be compensated. -Lasix restarted. Continue metoprolol as ordered.  Type 2 diabetes mellitus, insulin requiring. Patient is treated  with Januvia and Lantus chronically. Januvia is being held. Her blood glucoses are running on the low side of normal on 8/18 and she had an symptomatic hypoglycemic episode, so Lantus was discontinued and sliding scale NovoLog was changed to sensitive scale. Gentle dextrose was started. Now her blood glucose is elevated. Small dosing of twice a day Lantus restarted. -We'll decrease the rate of dextrose infusion.  Hypokalemia. Patient's serum potassium was within normal limits on admission. It has fallen to 3.1, likely secondary to Lasix given with the blood transfusions. -She was given several IV runs of serum potassium. Her magnesium level was within normal limits. Her serum potassium has improved.  Dementia. Currently stable. Will continue Aricept.  Chronic anxiety. Currently stable, will continue Xanax when necessary.  Leukocytosis. The patient's white blood cell count was within normal limits, but it increased to 13, and then it returned to normal. -It has trended up again. Urinalysis and chest x-ray ordered for evaluation.  Time spent: 30 minutes.    [place] [facility] Pager [phone]. If 7PM-7AM, please contact night-coverage at [email or web address], password [facility] 07/09/2015, 2:20 PM  LOS: 4 days

## 2015-07-09 NOTE — Evaluation (Signed)
Physical Therapy Evaluation Patient Details Name: Alicia Mason MRN: [id number] DOB: 05-May-1922 Today's Date: 07/09/2015   History of Present Illness  This is a 81 year old lady, who has a history of dementia, who presents with rectal bleeding which has been present for the last couple of days. She apparently also has been treated by the primary care physician for this in the last few days. She denies any hematemesis but does describe back pain for the last 3 days. She denies any nausea or vomiting or any abdominal pain. She's not been lightheaded or dizzy. She is diabetic. She is hypertensive. Evaluation in the emergency room shows her to have a hemoglobin of 5.7. She is now being admitted for further investigation and management.  Clinical Impression   Pt is more agreeable to PT evaluation today.  She was alert and cooperative but oriented only to person.  She was able to follow simple directions.  She is found to be generally deconditioned and only able to ambulate 20' using a walker.  She should be able to transition to ACLF with HHPT at d/c.    Follow Up Recommendations Home health PT    Equipment Recommendations  None recommended by PT    Recommendations for Other Services   none    Precautions / Restrictions Precautions Precautions: Fall Restrictions Weight Bearing Restrictions: No      Mobility  Bed Mobility Overal bed mobility: Needs Assistance Bed Mobility: Supine to Sit     Supine to sit: Min guard        Transfers Overall transfer level: Needs assistance Equipment used: Rolling walker (2 wheeled) Transfers: Sit to/from Stand Sit to Stand: Supervision            Ambulation/Gait Ambulation/Gait assistance: Supervision Ambulation Distance (Feet): 20 Feet Assistive device: Rolling walker (2 wheeled) Gait Pattern/deviations: Trunk flexed;Shuffle   Gait velocity interpretation: <1.8 ft/sec, indicative of risk for recurrent falls    Stairs             Wheelchair Mobility    Modified Rankin (Stroke Patients Only)       Balance Overall balance assessment: No apparent balance deficits (not formally assessed)                                           Pertinent Vitals/Pain Pain Assessment: No/denies pain    Home Living Family/patient expects to be discharged to:: Assisted living               Home Equipment: Walker - 2 wheels Additional Comments: any lther equipment is unknown    Prior Function           Comments: unknown...pt is unable to give an accurate history other than that she uses a walker for gait     Hand Dominance        Extremity/Trunk Assessment   Upper Extremity Assessment: Generalized weakness           Lower Extremity Assessment: Generalized weakness      Cervical / Trunk Assessment: Kyphotic  Communication   Communication: HOH  Cognition Arousal/Alertness: Awake/alert Behavior During Therapy: WFL for tasks assessed/performed Overall Cognitive Status: History of cognitive impairments - at baseline                      General Comments      Exercises  Assessment/Plan    PT Assessment Patient needs continued PT services  PT Diagnosis Difficulty walking;Generalized weakness   PT Problem List Decreased strength;Decreased activity tolerance;Decreased mobility;Decreased cognition  PT Treatment Interventions Gait training;Functional mobility training;Therapeutic exercise   PT Goals (Current goals can be found in the Care Plan section) Acute Rehab PT Goals Patient Stated Goal: none stated PT Goal Formulation: Patient unable to participate in goal setting Time For Goal Achievement: 07/23/15 Potential to Achieve Goals: Good    Frequency Min 3X/week   Barriers to discharge   none    Co-evaluation               End of Session Equipment Utilized During Treatment: Gait belt Activity Tolerance: Patient limited by fatigue Patient  left: in chair;with call bell/phone within reach;with chair alarm set Nurse Communication: Mobility status         Time: 0923-3007 PT Time Calculation (min) (ACUTE ONLY): 31 min   Charges:   PT Evaluation $Initial PT Evaluation Tier I: 1 Procedure     PT G CodesDemetrios Isaacs L  PT 07/09/2015, 11:29 AM [phone]

## 2015-07-09 NOTE — Progress Notes (Signed)
5-beat run of SVT reported to MD.  Awaiting advise.Marland KitchenMarland KitchenMarland Kitchen

## 2015-07-10 LAB — CBC
HEMATOCRIT: 27.7 % — AB (ref 36.0–46.0)
HEMOGLOBIN: 8.9 g/dL — AB (ref 12.0–15.0)
MCH: 29.9 pg (ref 26.0–34.0)
MCHC: 32.1 g/dL (ref 30.0–36.0)
MCV: 93 fL (ref 78.0–100.0)
Platelets: 239 10*3/uL (ref 150–400)
RBC: 2.98 MIL/uL — AB (ref 3.87–5.11)
RDW: 14.2 % (ref 11.5–15.5)
WBC: 8.8 10*3/uL (ref 4.0–10.5)

## 2015-07-10 LAB — BASIC METABOLIC PANEL
ANION GAP: 5 (ref 5–15)
BUN: 17 mg/dL (ref 6–20)
CHLORIDE: 104 mmol/L (ref 101–111)
CO2: 28 mmol/L (ref 22–32)
Calcium: 7.9 mg/dL — ABNORMAL LOW (ref 8.9–10.3)
Creatinine, Ser: 0.82 mg/dL (ref 0.44–1.00)
GFR calc Af Amer: >60 mL/min (ref >60)
GLUCOSE: 165 mg/dL — AB (ref 65–99)
POTASSIUM: 4.5 mmol/L (ref 3.5–5.1)
Sodium: 137 mmol/L (ref 135–145)

## 2015-07-10 LAB — GLUCOSE, CAPILLARY
GLUCOSE-CAPILLARY: 158 mg/dL — AB (ref 65–99)
GLUCOSE-CAPILLARY: 179 mg/dL — AB (ref 65–99)

## 2015-07-10 MED ORDER — ASPIRIN EC 81 MG PO TBEC: 81.0000 mg | DELAYED_RELEASE_TABLET | Freq: Every day | ORAL | Status: DC

## 2015-07-10 MED ORDER — FUROSEMIDE 40 MG PO TABS
60.0000 mg | ORAL_TABLET | Freq: Once | ORAL | Status: AC
  Administered 2015-07-10: 60 mg via ORAL
  Filled 2015-07-10 (×2): qty 1

## 2015-07-10 MED ORDER — SITAGLIPTIN PHOSPHATE 50 MG PO TABS: 50.0000 mg | ORAL_TABLET | Freq: Every morning | ORAL | Status: DC

## 2015-07-10 MED ORDER — PANTOPRAZOLE SODIUM 40 MG PO TBEC: 40.0000 mg | DELAYED_RELEASE_TABLET | Freq: Every day | ORAL | Status: DC

## 2015-07-10 MED ORDER — FUROSEMIDE 10 MG/ML IJ SOLN: 20.0000 mg | Freq: Once | INTRAMUSCULAR | Status: DC

## 2015-07-10 MED ORDER — FUROSEMIDE 20 MG PO TABS: 20.0000 mg | ORAL_TABLET | Freq: Two times a day (BID) | ORAL | Status: AC

## 2015-07-10 MED ORDER — LEVOFLOXACIN 750 MG PO TABS
750.0000 mg | ORAL_TABLET | ORAL | Status: DC
  Administered 2015-07-10: 750 mg via ORAL
  Filled 2015-07-10: qty 1

## 2015-07-10 MED ORDER — POTASSIUM CHLORIDE ER 10 MEQ PO TBCR: 10.0000 meq | EXTENDED_RELEASE_TABLET | Freq: Two times a day (BID) | ORAL | Status: DC

## 2015-07-10 MED ORDER — LEVOFLOXACIN 750 MG PO TABS: 750.0000 mg | ORAL_TABLET | ORAL | Status: DC

## 2015-07-10 NOTE — Progress Notes (Signed)
Pt discharging back to [facility].  Report called to [place] at [place].  Answered questions.  EMS to transport pt, will continue to monitor pt until leaves floor.

## 2015-07-10 NOTE — Discharge Summary (Signed)
Physician Discharge Summary  Alicia Mason TJQ:[id number] DOB: 1922-01-15 DOA: 07/05/2015  PCP: Glo Herring., MD  Admit date: 07/05/2015 Discharge date: 07/10/2015  Time spent: Greater than 30 minutes  Recommendations for Outpatient Follow-up:  1. Alicia Mason was discontinued indefinitely secondary to severe GI bleed. Aspirin can be started on 8/28. 2. Gastroenterology will schedule a follow-up appointment in the office. If you do not hear from them, call the office with the information provided for follow-up.  3. Patient needs a follow-up appointment scheduled with her PCP in 1-2 weeks. 4. Home health physical therapy ordered.  Discharge Diagnoses:  1. 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 2. Hemorrhoids and colon polyps, status post polypectomy. Pathology results were consistent with adenomatous polyps. 3. Gastritis and mild pyloric stenosis, per EGD. 4. Acute blood loss anemia. -Patient's hemoglobin was 5.7 on admission. She was transfused 2 units of packed red blood cells. -Hemoglobin was 8.9 at time of discharge. 5. Chronic paroxysmal atrial fibrillation. Xarelto was discontinued. 6. Bibasilar airspace disease with small pleural effusions, secondary to possible pneumonia and mild decompensated systolic heart failure. 7. Chronic systolic heart failure/ischemic cardiomyopathy with possible mild decompensation. 8. Hypertension. 9. Diabetes mellitus, type II, insulin requiring. 10. Hypokalemia. 11. Chronic dementia. 11. Chronic anxiety.  Discharge Condition: Improved.  Diet recommendation: carb modified, heart healthy.  Filed Weights   07/05/15 1432 07/05/15 2006  Weight: 45.36 kg (100 lb) 50.4 kg (111 lb 1.8 oz)    History of present illness:  The patient is a 81 year old woman with a history of mild dementia, ischemic cardiomyopathy, chronic systolic heart failure, paroxysmal atrial fibrillation on  anticoagulation, and diabetes mellitus, who presented to the emergency department on 07/05/2015 with a chief complaint of generalized weakness, lightheadedness, and rectal bleeding. In the ED, her hemoglobin was found to be 5.7.   Hospital Course:  1. GI bleeding in the setting of anticoagulation on Xarelto; due to Dieulafoy's lesion in the cecum On admission, the patient complained of red to black rectal bleeding. -Xarelto was discontinued. She was started on IV Protonix. It was eventually changed to by mouth. -Gastroenterology was consulted. Dr. Oneida Alar performed an EGD and colonoscopy on 07/07/15. The results were significant for a mild stricture at the GE junction; gastritis-status post biopsy; pyloric stenosis; visible vessel at the cecum secondary to Dieulafoy lesion-status post 2 clips; colon polyps-status post polypectomy, and internal hemorrhoids. -Dr. Oneida Alar recommended holding the Xarelto until 07/17/15. However, I recommended discontinuation of Xarelto indefinitely in this 81 year old with severe GI bleeding. I discussed this recommendation with her sister who is the Co- POA and she agreed. -Pathology report revealed gastritis and adenomatous polyps. -Patient had no further bright red blood per rectum or dark colored stools during hospitalization. Acute blood loss anemia. The patient's hemoglobin was 5.7 on admission. Her baseline hemoglobin is approximately 11-11.5. -She was transfused 2 units of packed red blood cells. Her hemoglobin improved appropriately. -Her hemoglobin was 8.9 at the time of discharge. Chronic paroxysmal atrial fibrillation. The patient had been previously treated with Xarelto, now discontinued. She is on metoprolol chronically for rate control. -Her heart rate did trend up some on 8/19, but is well controlled now.  -As stated above, Xarelto was discontinued indefinitely. She was instructed to start 81 mg aspirin on 07/18/2015. This was discussed with her sister who  agreed.  Ischemic cardiomyopathy/hypertension. Patient was gently hydrated on admission. Lasix was withheld. She was also given 2 units of packed red  blood cells with Lasix between both units. Follow-up chest x-ray revealed small bilateral pleural effusions which could be construed as mild decompensated heart failure. Lasix was resumed. She was given an additional dose of 60 mg orally prior to discharge. She was instructed to resume Lasix at 20 mg twice a day.  Possible basilar pneumonia Patient's follow-up chest x-ray revealed a basal airspace disease, atelectasis versus infiltrate. Although the patient had no pulmonary symptoms, she did have a mild increase in her white blood cell count. Therefore, she was started on Levaquin. She was discharged on Levaquin for a few more days. Type 2 diabetes mellitus, insulin requiring. Patient is treated with Januvia and Lantus chronically. Januvia was held during the hospital course.  Her blood glucoses were  running on the low side of normal on 8/18 and she had an symptomatic hypoglycemic episode, so Lantus was discontinued and sliding scale NovoLog was changed to sensitive scale. Gentle dextrose was started.  Subsequently, her blood glucose increase.  -She was discharged on Lantus and Januvia, but it is recommended that Januvia not be given for a blood sugar of less than 120. Hypokalemia. Patient's serum potassium was within normal limits on admission. It had fallen to 3.1, likely secondary to Lasix given with the blood transfusions. -She was given several IV runs of serum potassium. Her magnesium level was within normal limits. Her serum potassium has improved. Dementia. Currently stable. Will continue Aricept. Chronic anxiety. Currently stable, will continue Xanax when necessary.       Procedures: 1.EGD 07/07/15:ENDOSCOPIC IMPRESSION: PATENT stricture at the gastroesophageal junction; MILD Non-erosive gastritis; PYLORIC STENOSIS 2. Colonoscopy  07/07/15: The LEFT colon IS was redundant; LOWER GI BLEED DUE TO DIEULAFOY'S LESION IN CECUM; FOUR COLORECTAL POLYPS REMOVED; Small internal hemorrhoids  Consultations:  Gastroenterologist, Dr. Oneida Alar  Discharge Exam: Filed Vitals:   07/10/15 0750  BP: 153/66  Pulse: 66  Temp: 98.7 F (37.1 C)  Resp: 20    General: Elderly 81 year old woman in no acute distress.  Cardiovascular: Irregular, irregular with soft systolic murmur.  Respiratory: Occasional crackles in the bases. Breathing nonlabored.  Abdomen: Positive bowel sounds, soft, nontender, nondistended.  Musculoskeletal/extremities: No pedal edema. No acute hot red joints.  Neurologic: She is alert and oriented to herself, hospital. Cranial nerves II through XII are grossly intact.  Discharge Instructions   Discharge Instructions    Diet - low sodium heart healthy    Complete by:  As directed      Diet Carb Modified    Complete by:  As directed      Discharge instructions    Complete by:  As directed   Xarelto was discontinued. Aspirin can be started as your "blood thinner" on 07/18/2015.     Increase activity slowly    Complete by:  As directed           Current Discharge Medication List    START taking these medications   Details  aspirin EC 81 MG tablet Take 1 tablet (81 mg total) by mouth daily. START ON 07/18/15.    levofloxacin (LEVAQUIN) 750 MG tablet Take 1 tablet (750 mg total) by mouth every other day. Antibiotic. Take EVERY OTHER DAY UNTIL COMPLETED, STARTING ON MONDAY 07/12/15. Qty: 3 tablet, Refills: 0    pantoprazole (PROTONIX) 40 MG tablet Take 1 tablet (40 mg total) by mouth daily. Qty: 30 tablet, Refills: 3      CONTINUE these medications which have CHANGED   Details  furosemide (LASIX) 20 MG tablet Take  1 tablet (20 mg total) by mouth 2 (two) times daily. For treatment of heart failure. Qty: 60 tablet, Refills: 3    potassium chloride (K-DUR) 10 MEQ tablet Take 1 tablet (10 mEq total)  by mouth 2 (two) times daily. Potassium. Take each tablet with furosemide twice daily. Qty: 60 tablet, Refills: 3    sitaGLIPtin (JANUVIA) 50 MG tablet Take 1 tablet (50 mg total) by mouth every morning. Do not take if your blood sugar is less than 120.      CONTINUE these medications which have NOT CHANGED   Details  ALPRAZolam (XANAX) 0.5 MG tablet Take 1 tablet (0.5 mg total) by mouth 2 (two) times daily as needed for anxiety or sleep. Qty: 30 tablet, Refills: 0    cetirizine (ZYRTEC) 10 MG tablet Take 10 mg by mouth daily.    donepezil (ARICEPT) 10 MG tablet Take 10 mg by mouth at bedtime.    HYDROcodone-acetaminophen (NORCO/VICODIN) 5-325 MG per tablet Take 1 tablet by mouth 2 (two) times daily.    Insulin Glargine (LANTUS SOLOSTAR) 100 UNIT/ML Solostar Pen Inject 15 Units into the skin every morning.     metoprolol (LOPRESSOR) 50 MG tablet Take 1 tablet (50 mg total) by mouth 2 (two) times daily. Qty: 60 tablet, Refills: 0    mirabegron ER (MYRBETRIQ) 25 MG TB24 tablet Take 25 mg by mouth daily.    pravastatin (PRAVACHOL) 10 MG tablet Take 10 mg by mouth daily.       STOP taking these medications     ciprofloxacin (CIPRO) 250 MG tablet      Rivaroxaban (XARELTO) 15 MG TABS tablet      metroNIDAZOLE (FLAGYL) 500 MG tablet        Allergies  Allergen Reactions  . Cephalexin     NOT SURE OF THE REACTION, BUT WAS TOLD NOT TO TAKE IT. Granddaughter mentioned that patient had hives, rash. Denies anaphylactic reaction.   Follow-up Information    Follow up with Glo Herring., MD.   Specialty:  Internal Medicine   Why:  CALL TO MAKE FOLLOW-UP APPOINTMENT TO SEE HER DOCTOR IN 1-2 WEEKS.   Contact information:   [address] [place] [address] [phone]       Follow up with Barney Drain, MD.   Specialty:  Gastroenterology   Why:  GASTROENTEROLOGIST. Their office will call you with the follow-up appointment.   Contact information:   [place] [address] [phone]        The results of significant diagnostics from this hospitalization (including imaging, microbiology, ancillary and laboratory) are listed below for reference.    Significant Diagnostic Studies: Dg Chest Port 1 View  07/09/2015   CLINICAL DATA:  Abdominal pain.  EXAM: Single view of the chest 05/14/2015 and 10/01/2014.  COMPARISON:  None.  FINDINGS: There are small to moderate bilateral pleural effusions and basilar airspace disease. Cardiomegaly is noted but there is no evidence pulmonary edema. The patient is status post CABG.  IMPRESSION: Small to moderate bilateral pleural effusions and basilar airspace disease which could be atelectasis or pneumonia.  Cardiomegaly without edema.   Electronically Signed   By: Inge Rise M.D.   On: 07/09/2015 18:10    Microbiology: No results found for this or any previous visit (from the past 240 hour(s)).   Labs: Basic Metabolic Panel:  Recent Labs Lab 07/06/15 0510 07/07/15 4081 07/08/15 0615 07/09/15 0435 07/10/15 0603  NA 136 138  139 135 137  K 3.6 3.4* 3.1* 4.4 4.5  CL 103 104 106 104 104  CO2 26 24 26 23 28   GLUCOSE 141* 118* 69 253* 165*  BUN 25* 18 19 21* 17  CREATININE 0.84 0.80 0.82 0.98 0.82  CALCIUM 7.8* 7.9* 7.8* 8.0* 7.9*  MG  --   --  1.9 2.0  --    Liver Function Tests:  Recent Labs Lab 07/05/15 1535 07/06/15 0510  AST 23 49*  ALT 16 30  ALKPHOS 59 86  BILITOT 0.5 1.2  PROT 5.6* 5.2*  ALBUMIN 3.1* 2.9*   No results for input(s): LIPASE, AMYLASE in the last 168 hours. No results for input(s): AMMONIA in the last 168 hours. CBC:  Recent Labs Lab 07/05/15 1535  07/06/15 2341 07/07/15 0925 07/08/15 0615 07/09/15 0435 07/10/15 0603  WBC 7.6  < > 13.0* 9.4 9.3 12.3* 8.8  NEUTROABS 5.0  --   --   --   --   --   --   HGB 5.7*  < > 8.3* 8.9* 9.0* 9.4* 8.9*  HCT 17.0*  < > 24.6* 26.8* 28.0* 29.7* 27.7*  MCV 91.4  < > 88.2 89.6  91.5 93.1 93.0  PLT 188  < > 191 216 229 290 239  < > = values in this interval not displayed. Cardiac Enzymes: No results for input(s): CKTOTAL, CKMB, CKMBINDEX, TROPONINI in the last 168 hours. BNP: BNP (last 3 results) No results for input(s): BNP in the last 8760 hours.  ProBNP (last 3 results)  Recent Labs  10/01/14 0801  PROBNP 3877.0*    CBG:  Recent Labs Lab 07/09/15 1155 07/09/15 1656 07/09/15 2105 07/10/15 0727 07/10/15 1138  GLUCAP 226* 257* 215* 158* 179*       Signed:  Lio Wehrly  [facility] 07/10/2015, 1:21 PM

## 2015-07-12 ENCOUNTER — Encounter (HOSPITAL_COMMUNITY): Payer: Self-pay | Admitting: *Deleted

## 2015-07-12 ENCOUNTER — Emergency Department (HOSPITAL_COMMUNITY)
Admission: EM | Admit: 2015-07-12 | Discharge: 2015-07-12 | Disposition: A | Discharge status: Home / Self Care | Payer: Medicare Other | Attending: Emergency Medicine | Admitting: Emergency Medicine

## 2015-07-12 ENCOUNTER — Emergency Department (HOSPITAL_COMMUNITY): Payer: Medicare Other

## 2015-07-12 DIAGNOSIS — I5043 Acute on chronic combined systolic (congestive) and diastolic (congestive) heart failure: Secondary | ICD-10-CM | POA: Insufficient documentation

## 2015-07-12 DIAGNOSIS — Z86010 Personal history of colonic polyps: Secondary | ICD-10-CM | POA: Insufficient documentation

## 2015-07-12 DIAGNOSIS — F0390 Unspecified dementia without behavioral disturbance: Secondary | ICD-10-CM | POA: Insufficient documentation

## 2015-07-12 DIAGNOSIS — Y92129 Unspecified place in nursing home as the place of occurrence of the external cause: Secondary | ICD-10-CM | POA: Diagnosis not present

## 2015-07-12 DIAGNOSIS — S61511A Laceration without foreign body of right wrist, initial encounter: Secondary | ICD-10-CM | POA: Insufficient documentation

## 2015-07-12 DIAGNOSIS — Z8781 Personal history of (healed) traumatic fracture: Secondary | ICD-10-CM | POA: Diagnosis not present

## 2015-07-12 DIAGNOSIS — T148XXA Other injury of unspecified body region, initial encounter: Secondary | ICD-10-CM

## 2015-07-12 DIAGNOSIS — Y998 Other external cause status: Secondary | ICD-10-CM | POA: Insufficient documentation

## 2015-07-12 DIAGNOSIS — F419 Anxiety disorder, unspecified: Secondary | ICD-10-CM | POA: Insufficient documentation

## 2015-07-12 DIAGNOSIS — S81812A Laceration without foreign body, left lower leg, initial encounter: Secondary | ICD-10-CM | POA: Insufficient documentation

## 2015-07-12 DIAGNOSIS — S3992XA Unspecified injury of lower back, initial encounter: Secondary | ICD-10-CM | POA: Diagnosis present

## 2015-07-12 DIAGNOSIS — S22008A Other fracture of unspecified thoracic vertebra, initial encounter for closed fracture: Secondary | ICD-10-CM | POA: Insufficient documentation

## 2015-07-12 DIAGNOSIS — W19XXXA Unspecified fall, initial encounter: Secondary | ICD-10-CM

## 2015-07-12 DIAGNOSIS — M1990 Unspecified osteoarthritis, unspecified site: Secondary | ICD-10-CM | POA: Diagnosis not present

## 2015-07-12 DIAGNOSIS — S81811A Laceration without foreign body, right lower leg, initial encounter: Secondary | ICD-10-CM | POA: Insufficient documentation

## 2015-07-12 DIAGNOSIS — Z7982 Long term (current) use of aspirin: Secondary | ICD-10-CM | POA: Insufficient documentation

## 2015-07-12 DIAGNOSIS — Y9389 Activity, other specified: Secondary | ICD-10-CM | POA: Insufficient documentation

## 2015-07-12 DIAGNOSIS — Z79899 Other long term (current) drug therapy: Secondary | ICD-10-CM | POA: Insufficient documentation

## 2015-07-12 DIAGNOSIS — S79912A Unspecified injury of left hip, initial encounter: Secondary | ICD-10-CM | POA: Insufficient documentation

## 2015-07-12 DIAGNOSIS — I10 Essential (primary) hypertension: Secondary | ICD-10-CM | POA: Diagnosis not present

## 2015-07-12 DIAGNOSIS — I4891 Unspecified atrial fibrillation: Secondary | ICD-10-CM | POA: Diagnosis not present

## 2015-07-12 DIAGNOSIS — I252 Old myocardial infarction: Secondary | ICD-10-CM | POA: Insufficient documentation

## 2015-07-12 DIAGNOSIS — Z8673 Personal history of transient ischemic attack (TIA), and cerebral infarction without residual deficits: Secondary | ICD-10-CM | POA: Insufficient documentation

## 2015-07-12 DIAGNOSIS — E119 Type 2 diabetes mellitus without complications: Secondary | ICD-10-CM | POA: Diagnosis not present

## 2015-07-12 DIAGNOSIS — Z8719 Personal history of other diseases of the digestive system: Secondary | ICD-10-CM | POA: Diagnosis not present

## 2015-07-12 DIAGNOSIS — W1839XA Other fall on same level, initial encounter: Secondary | ICD-10-CM | POA: Insufficient documentation

## 2015-07-12 DIAGNOSIS — S22000A Wedge compression fracture of unspecified thoracic vertebra, initial encounter for closed fracture: Secondary | ICD-10-CM

## 2015-07-12 DIAGNOSIS — I2510 Atherosclerotic heart disease of native coronary artery without angina pectoris: Secondary | ICD-10-CM | POA: Insufficient documentation

## 2015-07-12 DIAGNOSIS — E785 Hyperlipidemia, unspecified: Secondary | ICD-10-CM | POA: Diagnosis not present

## 2015-07-12 MED ORDER — TRAMADOL HCL 50 MG PO TABS: 50.0000 mg | ORAL_TABLET | Freq: Once | ORAL | Status: DC

## 2015-07-12 MED ORDER — TRAMADOL HCL 50 MG PO TABS: 50.0000 mg | ORAL_TABLET | Freq: Four times a day (QID) | ORAL | Status: DC | PRN

## 2015-07-12 NOTE — ED Notes (Signed)
Pt brought in by [facility] for c/o fall; pt has abrasion to middle of back; pt has dementia and states she fell while trying to get to bathroom

## 2015-07-12 NOTE — ED Provider Notes (Signed)
CSN: [id number]     Arrival date & time 07/12/15  0207 History   First MD Initiated Contact with Patient 07/12/15 0209     Chief Complaint  Patient presents with  . Fall     (Consider location/radiation/quality/duration/timing/severity/associated sxs/prior Treatment) HPI Patient presents by EMS from nursing home after unwitnessed fall. Patient states that she got out of bed to go to the bathroom and slipped falling back against her bed. She sustained multiple skin tears and small abrasion to her back. Patient states she does not believe that she hit her head. She denies any syncope. She denies any focal weakness, numbness or visual changes. At this point patient only complains about mild low back pain and left hip pain. Past Medical History  Diagnosis Date  . Essential hypertension, benign   . Type 2 diabetes mellitus   . Coronary atherosclerosis of native coronary artery 1999    CABG LIMA to LAD, SVG to OM1 and OM2 and SVG to PDA in 1999,   . Closed fracture of right humerus     Managed conservatively 2011  . Cerebrovascular disease   . Seasonal allergies   . Arthritis   . Vertigo   . Ischemic cardiomyopathy     45-50% 07/2013/ EF 40% per Echo 06/2011.  Marland Kitchen Near syncope   . NSTEMI (non-ST elevated myocardial infarction)     10/02/2014 & 06/2011.  . Diabetes mellitus   . Acute ischemic stroke 08/04/2013    Right MCA stroke  . Atrial fibrillation 05/28/2009    Paroxysmal    . Dyslipidemia   . Chronic anxiety   . Acute on chronic combined systolic and diastolic heart failure 40/81/4481    EF 85-63%; grade 2 diastolic dysfunction  . Dementia   . Dieulafoy lesion of colon 07/08/2015  . Colon polyps 07/08/2015  . Pyloric stenosis 07/08/2015   Past Surgical History  Procedure Laterality Date  . Coronary artery bypass graft  1999    LIMA to LAD, SVG to OM1 and OM2, SVG to PDA  . Cataract surgery    . Abdominal hysterectomy      WITH BSO.  . Orif hip fracture Left 05/30/2014     Procedure: COMPRESSION HIP SCREW LEFT HIP;  Surgeon: Sanjuana Kava, MD;  Location: [facility];  Service: Orthopedics;  Laterality: Left;  . Colonoscopy N/A 07/07/2015    Procedure: COLONOSCOPY;  Surgeon: Danie Binder, MD;  Location: [facility];  Service: Endoscopy;  Laterality: N/A;  . Esophagogastroduodenoscopy N/A 07/07/2015    Procedure: ESOPHAGOGASTRODUODENOSCOPY (EGD);  Surgeon: Danie Binder, MD;  Location: [facility];  Service: Endoscopy;  Laterality: N/A;   Family History  Problem Relation Age of Onset  . CAD Sister     Died with MI age 59  . Pneumonia Mother   . Arthritis Other   . Coronary artery disease Other   . Diabetes Other   . Cancer Other   . Colon cancer Neg Hx    Social History  Substance Use Topics  . Smoking status: Never Smoker   . Smokeless tobacco: Never Used  . Alcohol Use: No   OB History    No data available     Review of Systems  Constitutional: Negative for fever and chills.  Respiratory: Negative for shortness of breath.   Cardiovascular: Negative for chest pain.  Gastrointestinal: Negative for nausea, vomiting and abdominal pain.  Musculoskeletal: Positive for back pain and arthralgias. Negative for neck pain.  Skin: Positive for wound.  Neurological: Negative for dizziness, syncope, weakness, light-headedness, numbness and headaches.  All other systems reviewed and are negative.     Allergies  Cephalexin  Home Medications   Prior to Admission medications   Medication Sig Start Date End Date Taking? Authorizing Provider  ALPRAZolam Duanne Moron) 0.5 MG tablet Take 1 tablet (0.5 mg total) by mouth 2 (two) times daily as needed for anxiety or sleep. Patient taking differently: Take 0.5 mg by mouth 3 (three) times daily as needed for anxiety or sleep.  12/26/14   Tanna Furry, MD  aspirin EC 81 MG tablet Take 1 tablet (81 mg total) by mouth daily. START ON 07/18/15. 07/10/15   Rexene Alberts, MD  cetirizine (ZYRTEC) 10 MG tablet Take 10 mg by mouth  daily.    Historical Provider, MD  donepezil (ARICEPT) 10 MG tablet Take 10 mg by mouth at bedtime.    Historical Provider, MD  furosemide (LASIX) 20 MG tablet Take 1 tablet (20 mg total) by mouth 2 (two) times daily. For treatment of heart failure. 07/10/15   Rexene Alberts, MD  HYDROcodone-acetaminophen (NORCO/VICODIN) 5-325 MG per tablet Take 1 tablet by mouth 2 (two) times daily.    Historical Provider, MD  Insulin Glargine (LANTUS SOLOSTAR) 100 UNIT/ML Solostar Pen Inject 15 Units into the skin every morning.     Historical Provider, MD  levofloxacin (LEVAQUIN) 750 MG tablet Take 1 tablet (750 mg total) by mouth every other day. Antibiotic. Take EVERY OTHER DAY UNTIL COMPLETED, STARTING ON MONDAY 07/12/15. 07/10/15   Rexene Alberts, MD  metoprolol (LOPRESSOR) 50 MG tablet Take 1 tablet (50 mg total) by mouth 2 (two) times daily. 06/01/14   Nita Sells, MD  mirabegron ER (MYRBETRIQ) 25 MG TB24 tablet Take 25 mg by mouth daily.    Historical Provider, MD  pantoprazole (PROTONIX) 40 MG tablet Take 1 tablet (40 mg total) by mouth daily. 07/10/15   Rexene Alberts, MD  potassium chloride (K-DUR) 10 MEQ tablet Take 1 tablet (10 mEq total) by mouth 2 (two) times daily. Potassium. Take each tablet with furosemide twice daily. 07/10/15   Rexene Alberts, MD  pravastatin (PRAVACHOL) 10 MG tablet Take 10 mg by mouth daily.     Historical Provider, MD  sitaGLIPtin (JANUVIA) 50 MG tablet Take 1 tablet (50 mg total) by mouth every morning. Do not take if your blood sugar is less than 120. 07/10/15   Rexene Alberts, MD  traMADol (ULTRAM) 50 MG tablet Take 1 tablet (50 mg total) by mouth every 6 (six) hours as needed. 07/12/15   Julianne Rice, MD   BP 139/75 mmHg  Pulse 65  Temp(Src) 97.4 F (36.3 C) (Oral)  Resp 16  Ht 5\' 5"  (1.651 m)  Wt 115 lb (52.164 kg)  BMI 19.14 kg/m2  SpO2 100% Physical Exam  Constitutional: She is oriented to person, place, and time. She appears well-developed and well-nourished. No  distress.  HENT:  Head: Normocephalic and atraumatic.  Mouth/Throat: Oropharynx is clear and moist.  No obvious head trauma  Eyes: EOM are normal. Pupils are equal, round, and reactive to light.  Neck: Normal range of motion. Neck supple.  No posterior midline cervical tenderness to palpation.  Cardiovascular: Normal rate and regular rhythm.   Pulmonary/Chest: Effort normal and breath sounds normal. No respiratory distress. She has no wheezes. She has no rales.  Abdominal: Soft. Bowel sounds are normal. She exhibits no distension and no mass. There is no tenderness. There is no rebound and no guarding.  Musculoskeletal:  Normal range of motion. She exhibits no edema or tenderness.  Full range of motion of all joints including bilateral hips the patient does have some mild left-sided hip pain with range of motion. There is an abrasion to the skin of the patient's lumbar region. There is no specific midline lumbar tenderness, step offs or deformities. Distal pulses are intact.  Neurological: She is alert and oriented to person, place, and time.  Patient moves all extremities without any deficit. Her sensation is fully intact.  Skin: Skin is warm and dry. No rash noted. No erythema.  Patient has multiple skin tears. She has one on her left lower leg and then one on the right. She also has a skin tear to the right wrist. There is no active bleeding.  Psychiatric: She has a normal mood and affect. Her behavior is normal.  Nursing note and vitals reviewed.   ED Course  Procedures (including critical care time) Labs Review Labs Reviewed - No data to display  Imaging Review Dg Lumbar Spine 2-3 Views  07/12/2015   CLINICAL DATA:  Low back pain. Left hip pain. Patient fell backwards in struck back on furniture. History of chronic back pain.  EXAM: LUMBAR SPINE - 2-3 VIEW  COMPARISON:  04/30/2009  FINDINGS: Diffuse degenerative change throughout the lumbar spine. No anterior subluxation. Compression  fracture of the T12 vertebral body anteriorly with about 70% loss of height. This appears to be progressing from previous study an acute fracture is not excluded. Recommend CT lumbar spine for further evaluation. No other vertebral compression deformities identified. Kyphosis of the thoracolumbar spine at this level. Vascular calcifications.  IMPRESSION: Diffuse degenerative change throughout the lumbar spine. Anterior compression of T12 is new since previous study and acute fracture is not excluded. Suggest CT lumbar spine for further evaluation.   Electronically Signed   By: Lucienne Capers M.D.   On: 07/12/2015 03:03   Ct Head Wo Contrast  07/12/2015   CLINICAL DATA:  Fall with abrasions to the back.  Dementia.  EXAM: CT HEAD WITHOUT CONTRAST  TECHNIQUE: Contiguous axial images were obtained from the base of the skull through the vertex without intravenous contrast.  COMPARISON:  12/26/2014  FINDINGS: Diffuse cerebral atrophy. Ventricular dilatation consistent with central atrophy. Low-attenuation changes throughout the deep white matter consistent with small vessel ischemia. No mass effect or midline shift. No abnormal extra-axial fluid collections. Gray-white matter junctions are distinct. Basal cisterns are not effaced. No evidence of acute intracranial hemorrhage. No depressed skull fractures. Mucosal thickening in the paranasal sinuses. Mastoid air cells are not opacified. Vascular calcifications. Extra-axial soft tissue lesion in the left posterior fossa likely represents a meningioma. No change since previous studies.  IMPRESSION: No acute intracranial abnormalities. Chronic atrophy and small vessel ischemic changes. Chronic appearing inflammatory changes in the paranasal sinuses. Extra-axial soft tissue lesion in the left posterior fossa probably represents a meningioma. No change since previous studies.   Electronically Signed   By: Lucienne Capers M.D.   On: 07/12/2015 03:16   Dg Hip Unilat With  Pelvis 1v Left  07/12/2015   CLINICAL DATA:  81 year old female with left hip pain. Fall trying to reach call button. History of hip surgery 1 year prior.  EXAM: DG HIP (WITH OR WITHOUT PELVIS) 1V*L*  COMPARISON:  06/30/2014  FINDINGS: Lateral plate and dynamic screw fixation of the left hip, hardware is intact. No periprosthetic lucency or fracture. Femoral head is normally located in the acetabulum. Pubic rami and pelvic ring  intact. No acute fracture. Vascular calcifications are seen. The bones are under mineralized.  IMPRESSION: Intact left hip surgical hardware without hardware complication or acute fracture.   Electronically Signed   By: Jeb Levering M.D.   On: 07/12/2015 03:12   I have personally reviewed and evaluated these images and lab results as part of my medical decision-making.   EKG Interpretation None      MDM   Final diagnoses:  Fall, initial encounter  Compression fracture of thoracic vertebra, closed, initial encounter  Multiple skin tears   Patient states she's had back pain prior to her fall. Likely T12 compression fracture. We'll treat symptomatically. If the patient continues to have pain she may need to follow-up with an orthopedist. CT abdomen without any acute findings. Skin tears are dressed. Will discharge back to nursing home. Return precautions given.     Julianne Rice, MD 07/12/15 [phone]

## 2015-07-12 NOTE — Discharge Instructions (Signed)
Vertebral Fracture You have a fracture of one or more vertebra. These are the bony parts that form the spine. Minor vertebral fractures happen when people fall. Osteoporosis is associated with many of these fractures. Hospital care may not be necessary for minor compression fractures that are stable. However, multiple fractures of the spine or unstable injuries can cause severe pain and even damage the spinal cord. A spinal cord injury may cause paralysis, numbness, or loss of normal bowel and bladder control.  Normally there is pain and stiffness in the back for 3 to 6 weeks after a vertebral fracture. Bed rest for several days, pain medicine, and a slow return to activity is often the only treatment that is needed depending on the location of the fracture. Neck and back braces may be helpful in reducing pain and increasing mobility. When your pain allows, you should begin walking or swimming to help maintain your endurance. Exercises to improve motion and to strengthen the back may also be useful after the initial pain improves. Treatment for osteoporosis may be essential for full recovery. This will help reduce your risk of vertebral fractures with a future fall. During the first few days after a spine fracture you may feel nauseated or vomit. If this is severe, hospital care with IV fluids will be needed.  Arrange for follow-up care as recommended to assure proper long-term care and prevention of further spine injury.  SEEK IMMEDIATE MEDICAL CARE IF:  You have increasing pain, vomiting, or are unable to move around at all.  You develop numbness, tingling, weakness, or paralysis of any part of your body.  You develop a loss of normal bowel or bladder control.  You have difficulty breathing, cough, fever, chest or abdominal pain. MAKE SURE YOU:   Understand these instructions.  Will watch your condition.  Will get help right away if you are not doing well or get worse. Document Released:  12/14/2004 Document Revised: 01/29/2012 Document Reviewed: 06/29/2009 [facility] Patient Information 2015 [place], [place]. This information is not intended to replace advice given to you by your health care provider. Make sure you discuss any questions you have with your health care provider. Skin Tear Care A skin tear is a wound in which the top layer of skin has peeled off. This is a common problem with aging because the skin becomes thinner and more fragile as a person gets older. In addition, some medicines, such as oral corticosteroids, can lead to skin thinning if taken for long periods of time.  A skin tear is often repaired with tape or skin adhesive strips. This keeps the skin that has been peeled off in contact with the healthier skin beneath. Depending on the location of the wound, a bandage (dressing) may be applied over the tape or skin adhesive strips. Sometimes, during the healing process, the skin turns black and dies. Even when this happens, the torn skin acts as a good dressing until the skin underneath gets healthier and repairs itself. HOME CARE INSTRUCTIONS   Change dressings once per day or as directed by your caregiver.  Gently clean the skin tear and the area around the tear using saline solution or mild soap and water.  Do not rub the injured skin dry. Let the area air dry.  Apply petroleum jelly or an antibiotic cream or ointment to keep the tear moist. This will help the wound heal. Do not allow a scab to form.  If the dressing sticks before the next dressing change, moisten  it with warm soapy water and gently remove it.  Protect the injured skin until it has healed.  Only take over-the-counter or prescription medicines as directed by your caregiver.  Take showers or baths using warm soapy water. Apply a new dressing after the shower or bath.  Keep all follow-up appointments as directed by your caregiver.  SEEK IMMEDIATE MEDICAL CARE IF:   You have redness,  swelling, or increasing pain in the skin tear.  You havepus coming from the skin tear.  You have chills.  You have a red streak that goes away from the skin tear.  You have a bad smell coming from the tear or dressing.  You have a fever or persistent symptoms for more than 2-3 days.  You have a fever and your symptoms suddenly get worse. MAKE SURE YOU:  Understand these instructions.  Will watch this condition.  Will get help right away if your child is not doing well or gets worse. Document Released: 08/01/2001 Document Revised: 07/31/2012 Document Reviewed: 05/20/2012 [facility] Patient Information 2015 [place], [place]. This information is not intended to replace advice given to you by your health care provider. Make sure you discuss any questions you have with your health care provider.

## 2015-07-12 NOTE — ED Notes (Signed)
Discharge instructions given, pt demonstrated teach back and verbal understanding. No concerns voiced.  

## 2015-07-14 ENCOUNTER — Telehealth: Payer: Self-pay

## 2015-07-14 NOTE — Telephone Encounter (Signed)
T/C from [place] at [place], said pt had colonoscopy on 07/07/2015 and had some polyps removed and clips placed. Pt just started complaining this Am with feeling like something sticking her in her butt. They would like to know if it is possible for one of the clips to have come loose and be doing this. Please advise !

## 2015-07-14 NOTE — Telephone Encounter (Signed)
I spoke with Dr. Oneida Alar and she said it should not be coming from the clamps. She will probably feel better after she has a good BM.  It is OK to give pt an anema. I called and informed Sharyn Lull of the recommendations.

## 2015-07-15 NOTE — Telephone Encounter (Signed)
REVIEWED-NO ADDITIONAL RECOMMENDATIONS. 

## 2015-07-20 ENCOUNTER — Encounter: Payer: Self-pay | Admitting: *Deleted

## 2015-07-20 ENCOUNTER — Other Ambulatory Visit: Payer: Self-pay | Admitting: *Deleted

## 2015-07-20 NOTE — Patient Outreach (Signed)
[place] [facility]) Care Management  07/20/2015  Alicia Mason 1922-01-20 [id number]   Notification received from Sherrin Daisy, [facility] to close case. Patient is not eligible for benefit, in long term care and will not return home.  Kollins Fenter L. Alcide Memoli, [place] Care Management Assistant

## 2015-07-20 NOTE — Patient Outreach (Signed)
[facility]) Care Management  07/20/2015  Alicia Mason 06-01-1922 [id number]   High Risk referral:  Telephone call to patient: recording states RN CM has reached [name] in [name]. Operator referred  RN CM to patient's sister who voiced that patient is currently in long term care at [name] in memory care unit. States she will not be returning home and that she has case management services at [facility] as needed.  Plan: will close case and send MD closure letter. Sherrin Daisy, RN BSN [place] Management Coordinator [facility] Care Management  [phone]

## 2016-04-14 ENCOUNTER — Other Ambulatory Visit (HOSPITAL_COMMUNITY)
Admission: AD | Admit: 2016-04-14 | Discharge: 2016-04-14 | Disposition: A | Discharge status: Home / Self Care | Payer: Medicare Other | Source: Skilled Nursing Facility | Attending: Vascular Surgery | Admitting: Vascular Surgery

## 2016-04-14 DIAGNOSIS — E119 Type 2 diabetes mellitus without complications: Secondary | ICD-10-CM | POA: Insufficient documentation

## 2016-04-14 DIAGNOSIS — N390 Urinary tract infection, site not specified: Secondary | ICD-10-CM | POA: Diagnosis present

## 2016-04-14 LAB — URINALYSIS, ROUTINE W REFLEX MICROSCOPIC
Bilirubin Urine: NEGATIVE
Glucose, UA: >1000 mg/dL — AB
Ketones, ur: NEGATIVE mg/dL
NITRITE: POSITIVE — AB
PROTEIN: NEGATIVE mg/dL
SPECIFIC GRAVITY, URINE: 1.02 (ref 1.005–1.030)
pH: 5.5 (ref 5.0–8.0)

## 2016-04-14 LAB — URINE MICROSCOPIC-ADD ON

## 2016-04-17 LAB — URINE CULTURE

## 2016-06-13 IMAGING — DX DG HIP (WITH OR WITHOUT PELVIS) 1V*L*
3 series · 3 of 3 positions shown · non-contrast
Comparison: 06/30/2014

CLINICAL DATA: [AGE] female with left hip pain. Fall trying
to reach call button. History of hip surgery 1 year prior.

EXAM:
DG HIP (WITH OR WITHOUT PELVIS) 1V*L*

[pelvis ap]
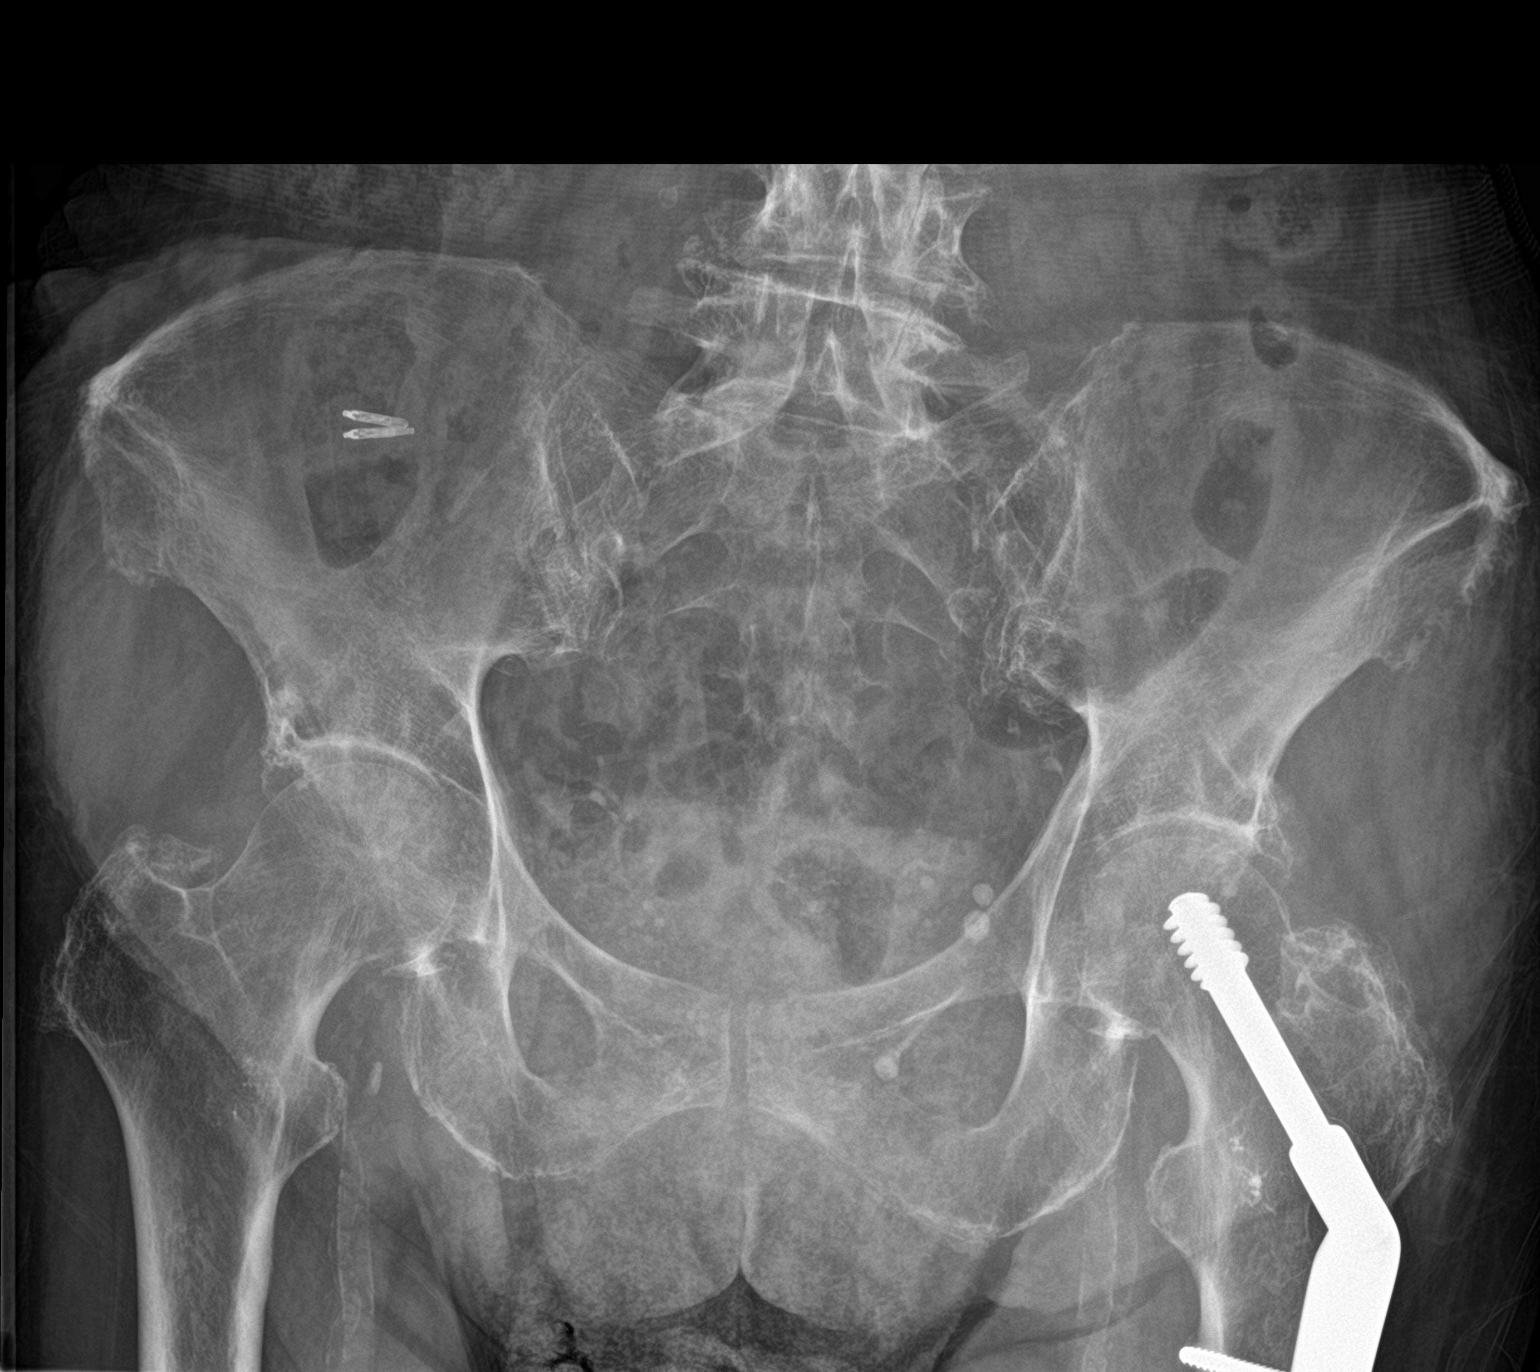

[hip ap]
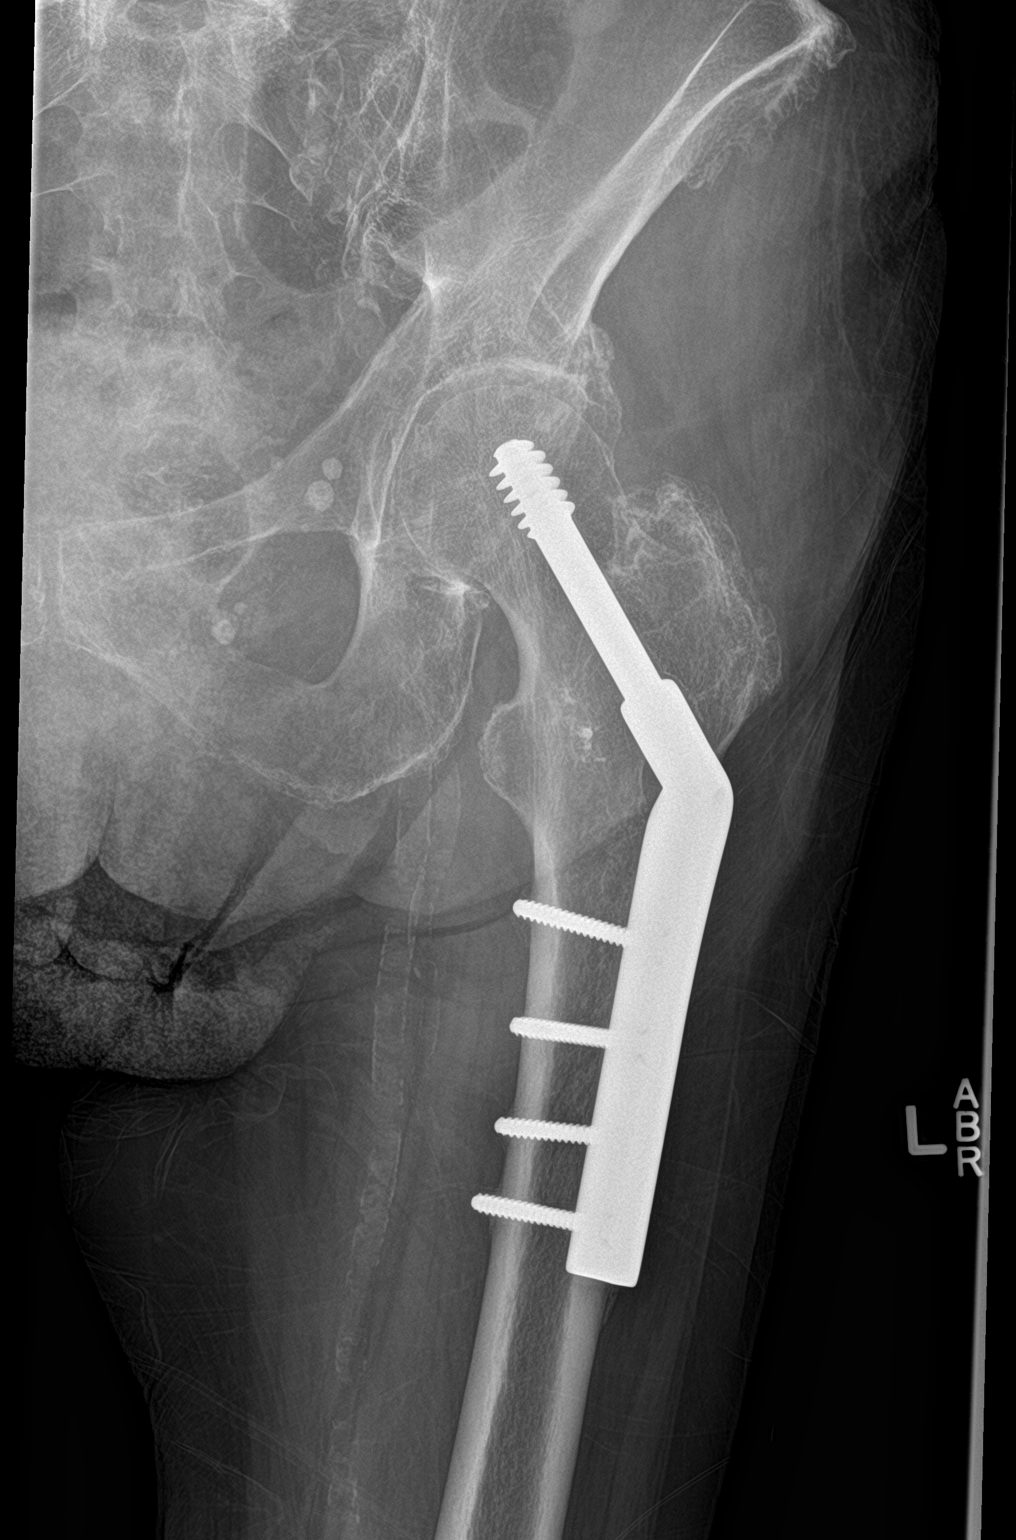

[hip lat]
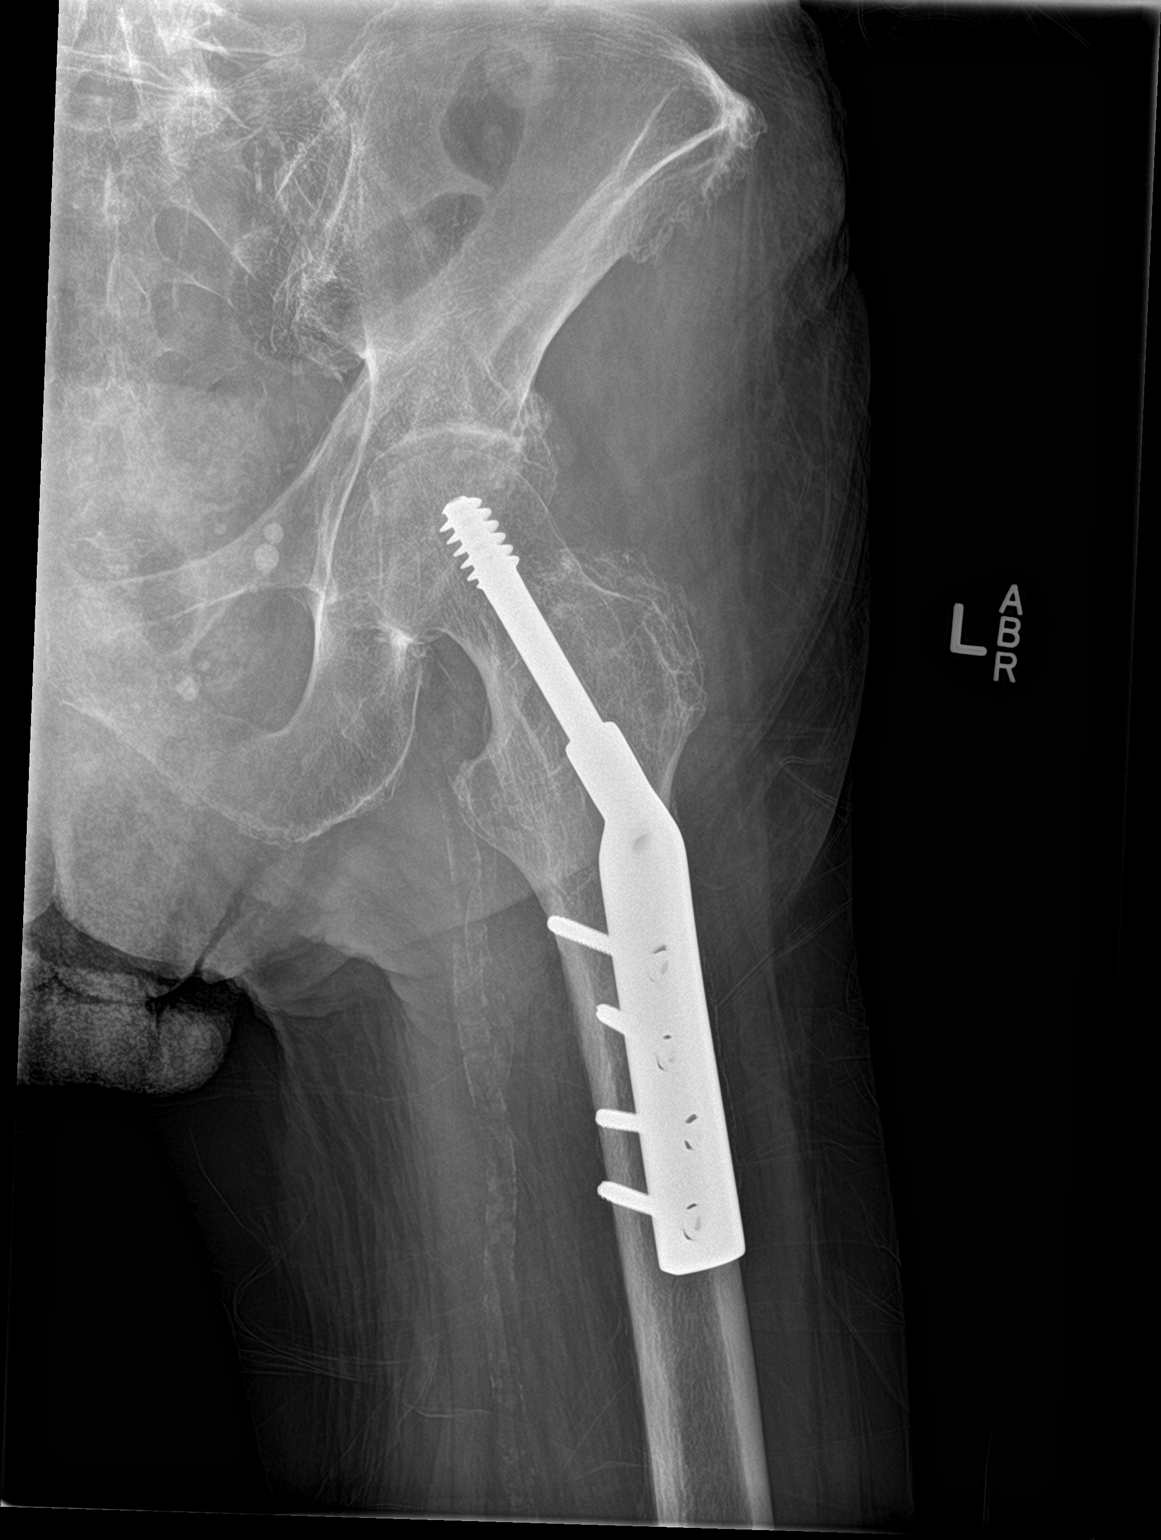

[3 of 3 positions shown; findings below may reference images not displayed]

FINDINGS: Lateral plate and dynamic screw fixation of the left hip, hardware
is intact. No periprosthetic lucency or fracture. Femoral head is
normally located in the acetabulum. Pubic rami and pelvic ring
intact. No acute fracture. Vascular calcifications are seen. The
bones are under mineralized.
IMPRESSION: Intact left hip surgical hardware without hardware complication or
acute fracture.

## 2016-06-13 IMAGING — DX DG LUMBAR SPINE 2-3V
3 series · 3 of 3 positions shown · non-contrast
Comparison: 04/30/2009

CLINICAL DATA: Low back pain. Left hip pain. Patient fell backwards
in struck back on furniture. History of chronic back pain.

EXAM:
LUMBAR SPINE - 2-3 VIEW

[l-spine ap]
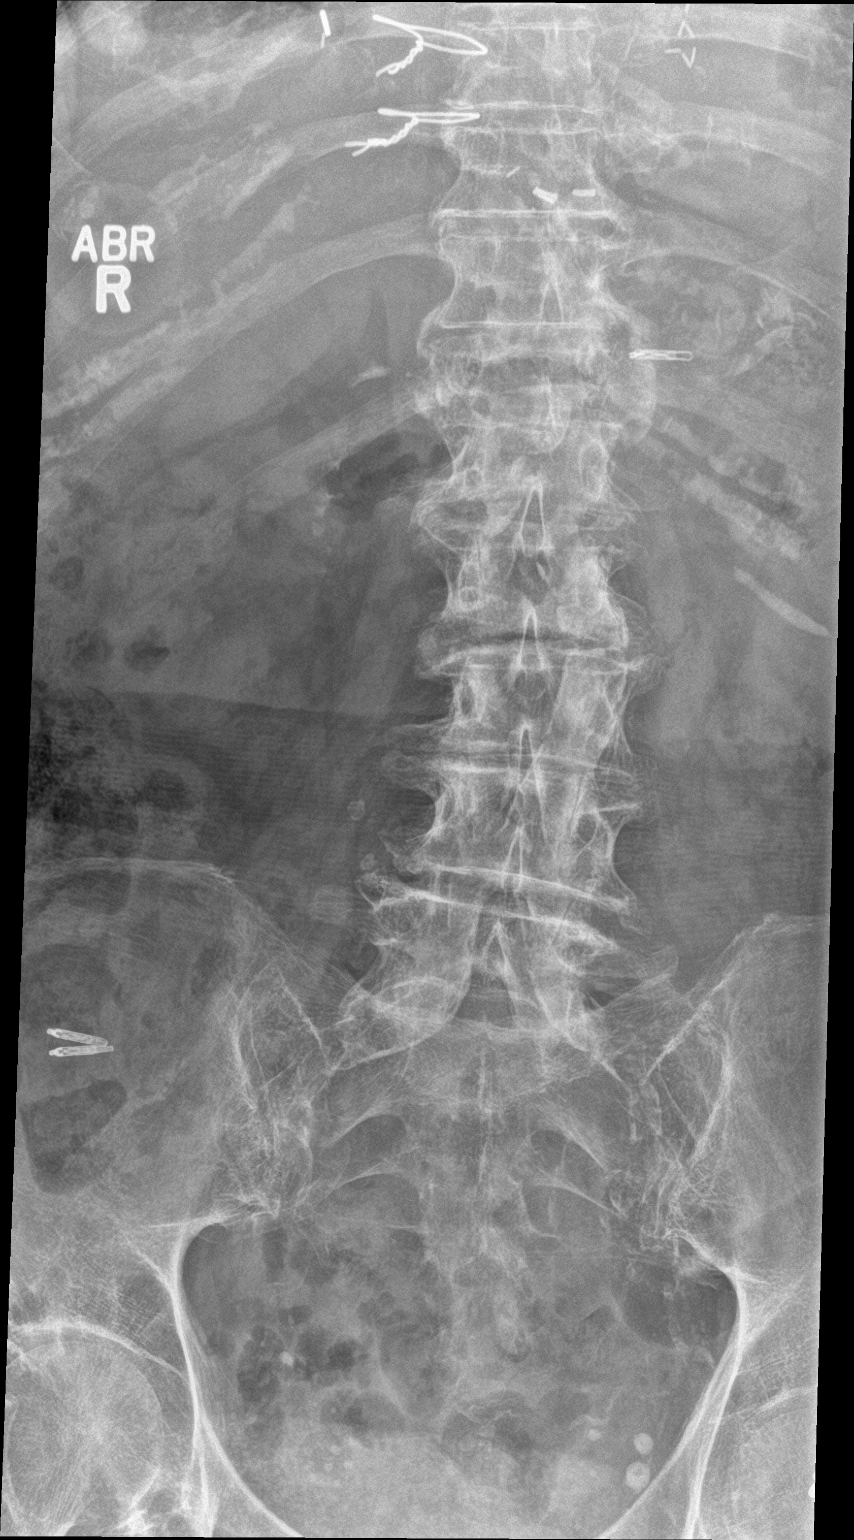

[l-spine lat]
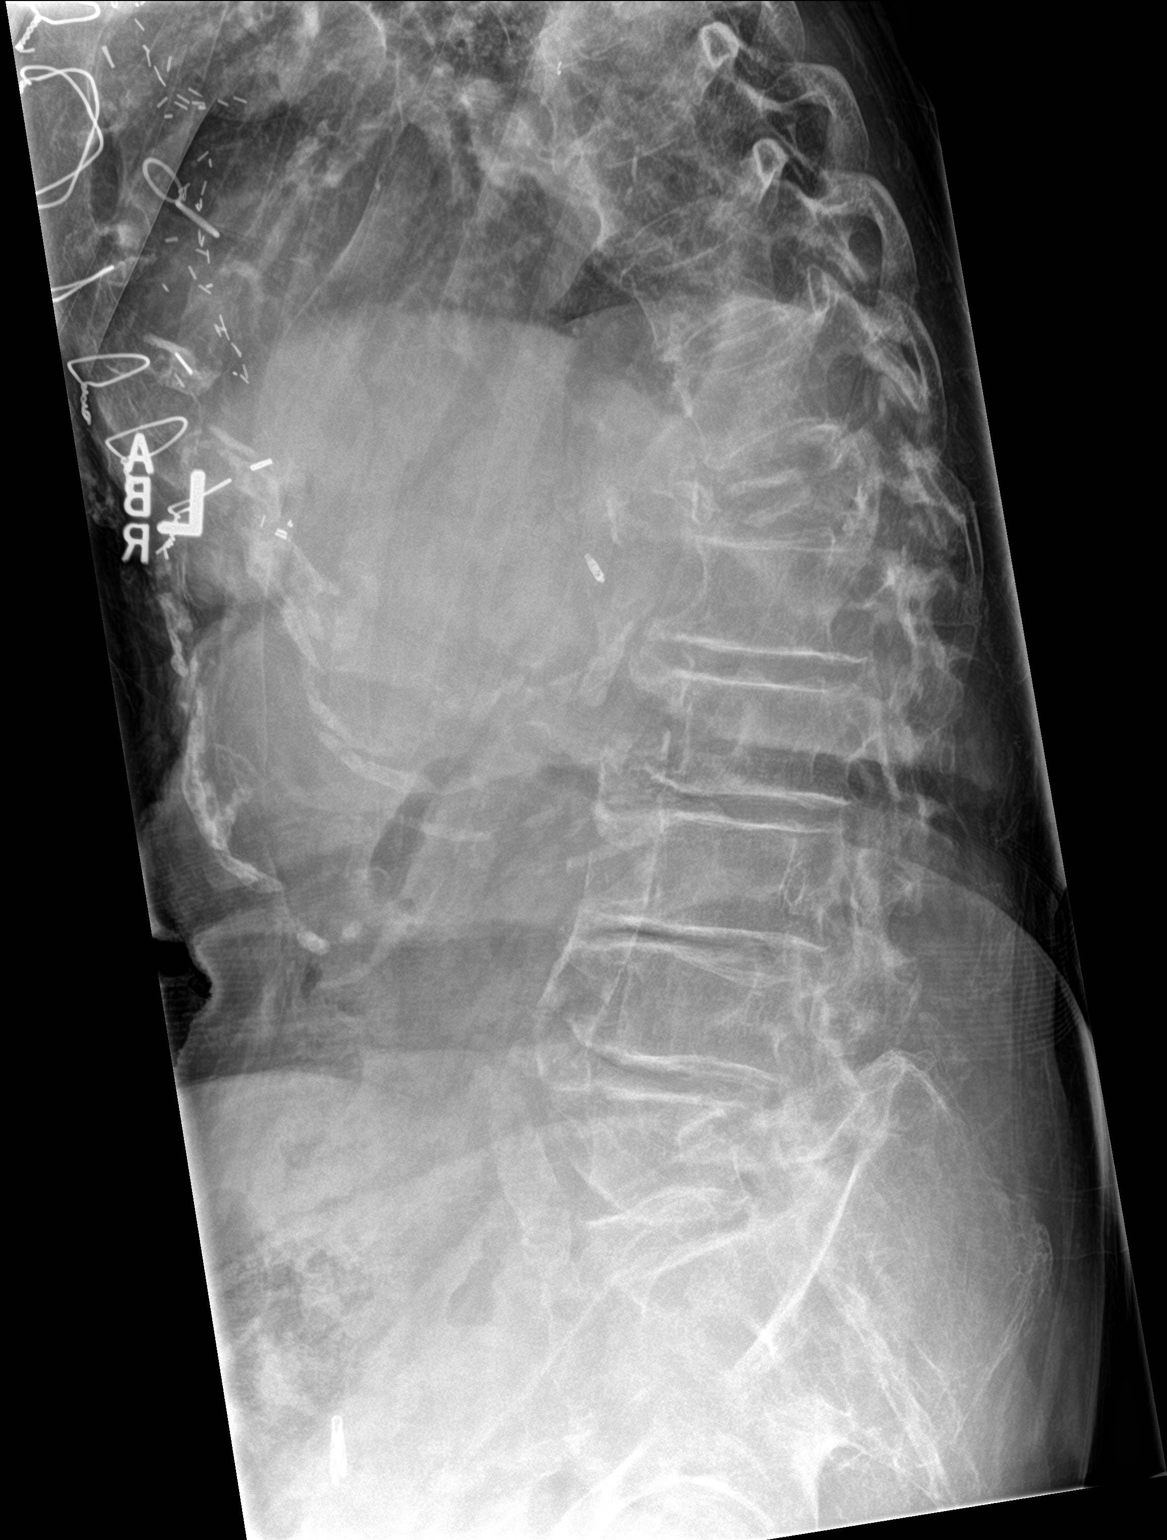

[l-spine spot]
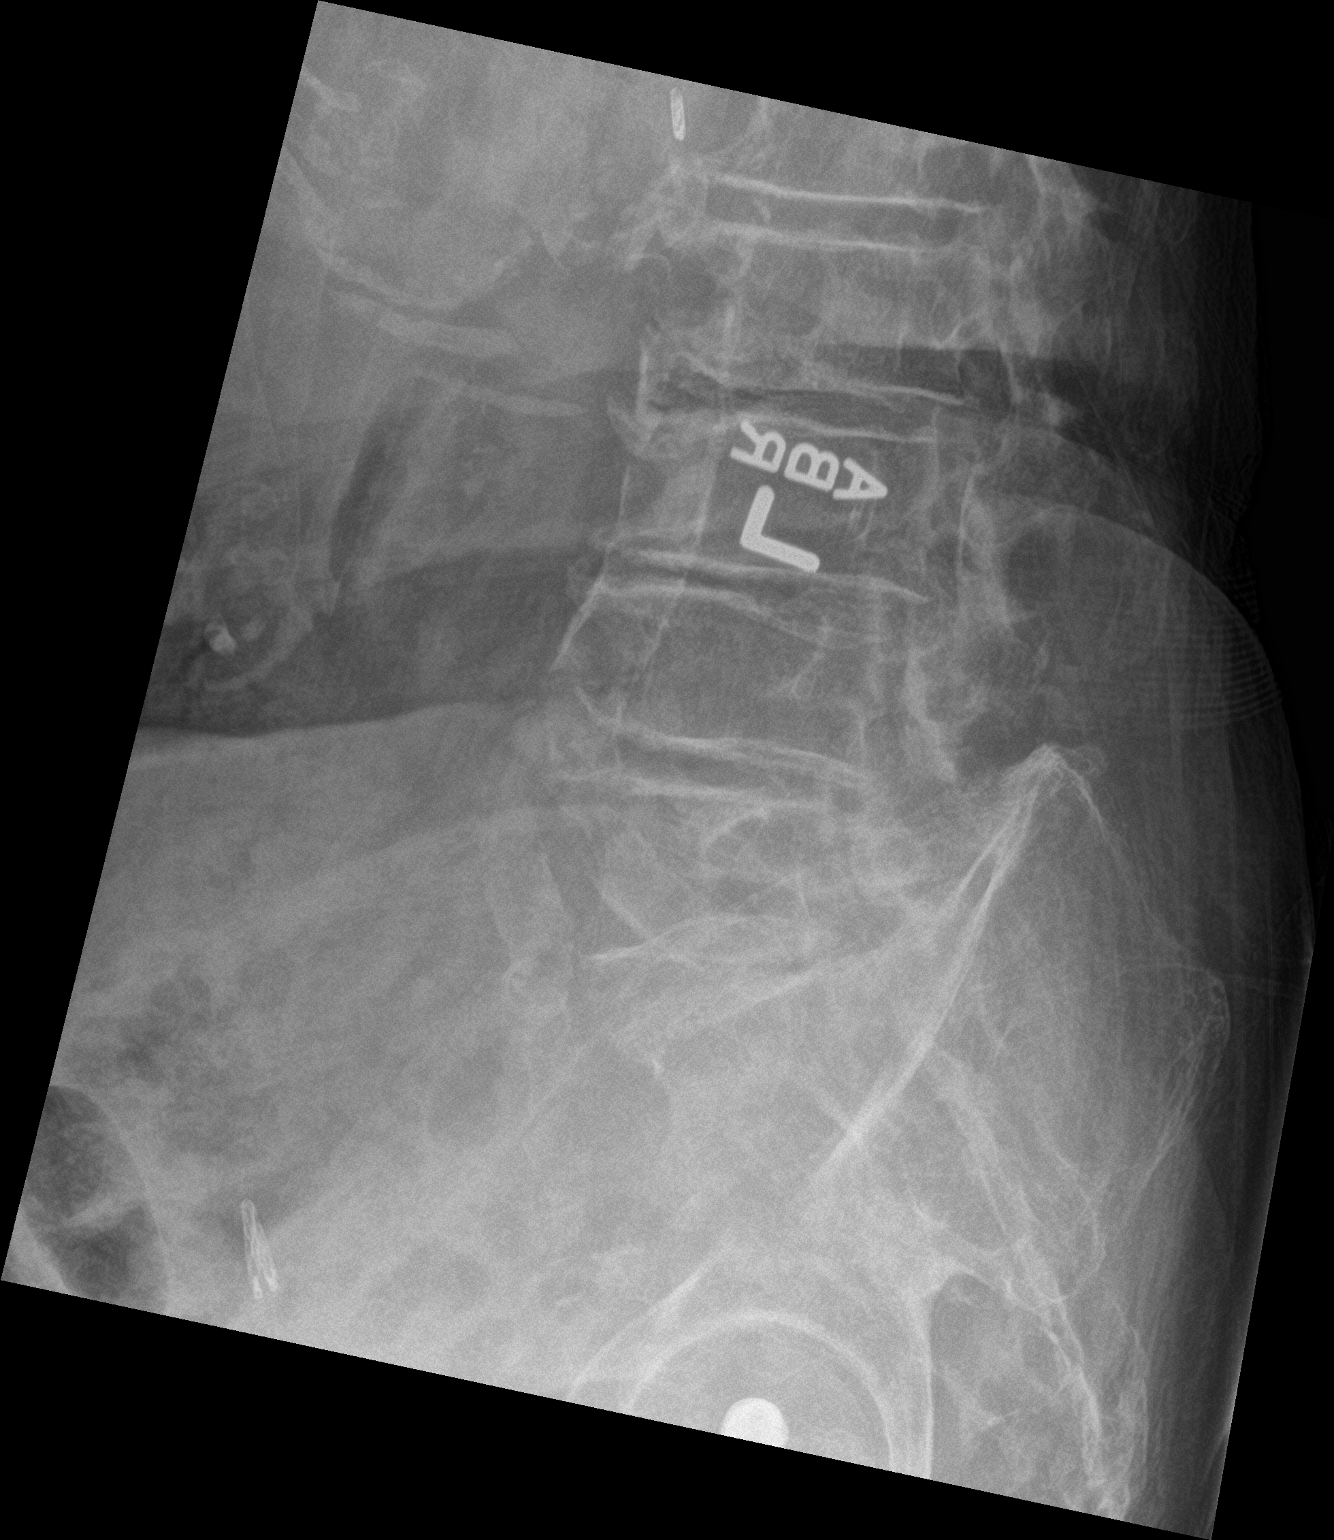

[3 of 3 positions shown; findings below may reference images not displayed]

FINDINGS: Diffuse degenerative change throughout the lumbar spine. No anterior
subluxation. Compression fracture of the T12 vertebral body
anteriorly with about 70% loss of height. This appears to be
progressing from previous study an acute fracture is not excluded.
Recommend CT lumbar spine for further evaluation. No other vertebral
compression deformities identified. Kyphosis of the thoracolumbar
spine at this level. Vascular calcifications.
IMPRESSION: Diffuse degenerative change throughout the lumbar spine. Anterior
compression of T12 is new since previous study and acute fracture is
not excluded. Suggest CT lumbar spine for further evaluation.

## 2016-08-04 ENCOUNTER — Encounter (HOSPITAL_COMMUNITY): Payer: Self-pay | Admitting: *Deleted

## 2016-08-04 ENCOUNTER — Inpatient Hospital Stay (HOSPITAL_COMMUNITY): Payer: Medicare Other

## 2016-08-04 ENCOUNTER — Emergency Department (HOSPITAL_COMMUNITY): Payer: Medicare Other

## 2016-08-04 ENCOUNTER — Inpatient Hospital Stay (HOSPITAL_COMMUNITY)
Admission: EM | Admit: 2016-08-04 | Discharge: 2016-08-08 | DRG: 064 | Disposition: A | Discharge status: Hospice | Payer: Medicare Other | Attending: Internal Medicine | Admitting: Internal Medicine

## 2016-08-04 DIAGNOSIS — Z515 Encounter for palliative care: Secondary | ICD-10-CM | POA: Diagnosis not present

## 2016-08-04 DIAGNOSIS — R531 Weakness: Secondary | ICD-10-CM | POA: Diagnosis present

## 2016-08-04 DIAGNOSIS — I480 Paroxysmal atrial fibrillation: Secondary | ICD-10-CM

## 2016-08-04 DIAGNOSIS — I2510 Atherosclerotic heart disease of native coronary artery without angina pectoris: Secondary | ICD-10-CM | POA: Diagnosis present

## 2016-08-04 DIAGNOSIS — I482 Chronic atrial fibrillation: Secondary | ICD-10-CM | POA: Diagnosis present

## 2016-08-04 DIAGNOSIS — E785 Hyperlipidemia, unspecified: Secondary | ICD-10-CM | POA: Diagnosis present

## 2016-08-04 DIAGNOSIS — Z951 Presence of aortocoronary bypass graft: Secondary | ICD-10-CM

## 2016-08-04 DIAGNOSIS — G309 Alzheimer's disease, unspecified: Secondary | ICD-10-CM | POA: Diagnosis present

## 2016-08-04 DIAGNOSIS — F0150 Vascular dementia without behavioral disturbance: Secondary | ICD-10-CM | POA: Diagnosis present

## 2016-08-04 DIAGNOSIS — Z66 Do not resuscitate: Secondary | ICD-10-CM | POA: Diagnosis present

## 2016-08-04 DIAGNOSIS — Z794 Long term (current) use of insulin: Secondary | ICD-10-CM

## 2016-08-04 DIAGNOSIS — R1310 Dysphagia, unspecified: Secondary | ICD-10-CM | POA: Diagnosis present

## 2016-08-04 DIAGNOSIS — F0280 Dementia in other diseases classified elsewhere without behavioral disturbance: Secondary | ICD-10-CM | POA: Diagnosis present

## 2016-08-04 DIAGNOSIS — I5042 Chronic combined systolic (congestive) and diastolic (congestive) heart failure: Secondary | ICD-10-CM | POA: Diagnosis present

## 2016-08-04 DIAGNOSIS — G9340 Encephalopathy, unspecified: Secondary | ICD-10-CM | POA: Diagnosis present

## 2016-08-04 DIAGNOSIS — I63511 Cerebral infarction due to unspecified occlusion or stenosis of right middle cerebral artery: Principal | ICD-10-CM | POA: Diagnosis present

## 2016-08-04 DIAGNOSIS — I5022 Chronic systolic (congestive) heart failure: Secondary | ICD-10-CM | POA: Diagnosis not present

## 2016-08-04 DIAGNOSIS — Z8249 Family history of ischemic heart disease and other diseases of the circulatory system: Secondary | ICD-10-CM | POA: Diagnosis not present

## 2016-08-04 DIAGNOSIS — I255 Ischemic cardiomyopathy: Secondary | ICD-10-CM | POA: Diagnosis present

## 2016-08-04 DIAGNOSIS — I639 Cerebral infarction, unspecified: Secondary | ICD-10-CM | POA: Diagnosis not present

## 2016-08-04 DIAGNOSIS — I10 Essential (primary) hypertension: Secondary | ICD-10-CM | POA: Diagnosis not present

## 2016-08-04 DIAGNOSIS — Z7189 Other specified counseling: Secondary | ICD-10-CM | POA: Diagnosis not present

## 2016-08-04 DIAGNOSIS — E119 Type 2 diabetes mellitus without complications: Secondary | ICD-10-CM

## 2016-08-04 DIAGNOSIS — I4891 Unspecified atrial fibrillation: Secondary | ICD-10-CM | POA: Diagnosis present

## 2016-08-04 DIAGNOSIS — M6281 Muscle weakness (generalized): Secondary | ICD-10-CM

## 2016-08-04 DIAGNOSIS — Z833 Family history of diabetes mellitus: Secondary | ICD-10-CM

## 2016-08-04 DIAGNOSIS — I6389 Other cerebral infarction: Secondary | ICD-10-CM

## 2016-08-04 DIAGNOSIS — E11649 Type 2 diabetes mellitus with hypoglycemia without coma: Secondary | ICD-10-CM | POA: Diagnosis present

## 2016-08-04 DIAGNOSIS — I252 Old myocardial infarction: Secondary | ICD-10-CM

## 2016-08-04 DIAGNOSIS — R29898 Other symptoms and signs involving the musculoskeletal system: Secondary | ICD-10-CM

## 2016-08-04 DIAGNOSIS — R278 Other lack of coordination: Secondary | ICD-10-CM

## 2016-08-04 DIAGNOSIS — G8194 Hemiplegia, unspecified affecting left nondominant side: Secondary | ICD-10-CM | POA: Diagnosis present

## 2016-08-04 DIAGNOSIS — I110 Hypertensive heart disease with heart failure: Secondary | ICD-10-CM | POA: Diagnosis present

## 2016-08-04 DIAGNOSIS — R1312 Dysphagia, oropharyngeal phase: Secondary | ICD-10-CM

## 2016-08-04 DIAGNOSIS — Z79899 Other long term (current) drug therapy: Secondary | ICD-10-CM

## 2016-08-04 DIAGNOSIS — Z86010 Personal history of colonic polyps: Secondary | ICD-10-CM | POA: Diagnosis not present

## 2016-08-04 DIAGNOSIS — I6789 Other cerebrovascular disease: Secondary | ICD-10-CM | POA: Diagnosis not present

## 2016-08-04 LAB — CBC
HEMATOCRIT: 36.8 % (ref 36.0–46.0)
HEMOGLOBIN: 11.9 g/dL — AB (ref 12.0–15.0)
MCH: 28.7 pg (ref 26.0–34.0)
MCHC: 32.3 g/dL (ref 30.0–36.0)
MCV: 88.9 fL (ref 78.0–100.0)
Platelets: 252 10*3/uL (ref 150–400)
RBC: 4.14 MIL/uL (ref 3.87–5.11)
RDW: 14.6 % (ref 11.5–15.5)
WBC: 9.5 10*3/uL (ref 4.0–10.5)

## 2016-08-04 LAB — PROTIME-INR
INR: 1
Prothrombin Time: 13.2 seconds (ref 11.4–15.2)

## 2016-08-04 LAB — DIFFERENTIAL
Basophils Absolute: 0 10*3/uL (ref 0.0–0.1)
Basophils Relative: 0 %
EOS ABS: 0.3 10*3/uL (ref 0.0–0.7)
EOS PCT: 3 %
LYMPHS ABS: 1.6 10*3/uL (ref 0.7–4.0)
LYMPHS PCT: 17 %
MONO ABS: 0.5 10*3/uL (ref 0.1–1.0)
MONOS PCT: 6 %
Neutro Abs: 7 10*3/uL (ref 1.7–7.7)
Neutrophils Relative %: 74 %

## 2016-08-04 LAB — COMPREHENSIVE METABOLIC PANEL
ALBUMIN: 4.2 g/dL (ref 3.5–5.0)
ALT: 16 U/L (ref 14–54)
AST: 20 U/L (ref 15–41)
Alkaline Phosphatase: 108 U/L (ref 38–126)
Anion gap: 11 (ref 5–15)
BUN: 25 mg/dL — AB (ref 6–20)
CHLORIDE: 98 mmol/L — AB (ref 101–111)
CO2: 28 mmol/L (ref 22–32)
CREATININE: 1.01 mg/dL — AB (ref 0.44–1.00)
Calcium: 8.8 mg/dL — ABNORMAL LOW (ref 8.9–10.3)
GFR calc Af Amer: 54 mL/min — ABNORMAL LOW (ref >60)
GFR, EST NON AFRICAN AMERICAN: 47 mL/min — AB (ref >60)
GLUCOSE: 274 mg/dL — AB (ref 65–99)
Potassium: 4.2 mmol/L (ref 3.5–5.1)
Sodium: 137 mmol/L (ref 135–145)
Total Bilirubin: 1 mg/dL (ref 0.3–1.2)
Total Protein: 7.5 g/dL (ref 6.5–8.1)

## 2016-08-04 LAB — I-STAT CHEM 8, ED
BUN: 24 mg/dL — AB (ref 6–20)
CHLORIDE: 96 mmol/L — AB (ref 101–111)
CREATININE: 1.1 mg/dL — AB (ref 0.44–1.00)
Calcium, Ion: 1.08 mmol/L — ABNORMAL LOW (ref 1.15–1.40)
Glucose, Bld: 270 mg/dL — ABNORMAL HIGH (ref 65–99)
HEMATOCRIT: 39 % (ref 36.0–46.0)
Hemoglobin: 13.3 g/dL (ref 12.0–15.0)
POTASSIUM: 4.2 mmol/L (ref 3.5–5.1)
SODIUM: 138 mmol/L (ref 135–145)
TCO2: 28 mmol/L (ref 0–100)

## 2016-08-04 LAB — I-STAT TROPONIN, ED: TROPONIN I, POC: 0.02 ng/mL (ref 0.00–0.08)

## 2016-08-04 LAB — GLUCOSE, CAPILLARY
GLUCOSE-CAPILLARY: 145 mg/dL — AB (ref 65–99)
Glucose-Capillary: 49 mg/dL — ABNORMAL LOW (ref 65–99)
Glucose-Capillary: 55 mg/dL — ABNORMAL LOW (ref 65–99)
Glucose-Capillary: 68 mg/dL (ref 65–99)

## 2016-08-04 LAB — CBG MONITORING, ED
Glucose-Capillary: 272 mg/dL — ABNORMAL HIGH (ref 65–99)
Glucose-Capillary: 286 mg/dL — ABNORMAL HIGH (ref 65–99)
Glucose-Capillary: 255 mg/dL — ABNORMAL HIGH (ref 65–99)

## 2016-08-04 LAB — APTT: aPTT: 32 seconds (ref 24–36)

## 2016-08-04 LAB — MRSA PCR SCREENING: MRSA BY PCR: NEGATIVE

## 2016-08-04 MED ORDER — HYDROCODONE-ACETAMINOPHEN 5-325 MG PO TABS: 1.0000 | ORAL_TABLET | Freq: Every day | ORAL | Status: DC | PRN

## 2016-08-04 MED ORDER — METOPROLOL TARTRATE 25 MG PO TABS: 25.0000 mg | ORAL_TABLET | Freq: Every day | ORAL | Status: DC

## 2016-08-04 MED ORDER — MORPHINE SULFATE (PF) 2 MG/ML IV SOLN
1.0000 mg | INTRAVENOUS | Status: DC | PRN
  Administered 2016-08-04 – 2016-08-07 (×2): 1 mg via INTRAVENOUS
  Filled 2016-08-04: qty 1

## 2016-08-04 MED ORDER — ENOXAPARIN SODIUM 30 MG/0.3ML ~~LOC~~ SOLN
30.0000 mg | SUBCUTANEOUS | Status: DC
  Administered 2016-08-04 – 2016-08-06 (×3): 30 mg via SUBCUTANEOUS
  Filled 2016-08-04 (×3): qty 0.3

## 2016-08-04 MED ORDER — SENNOSIDES-DOCUSATE SODIUM 8.6-50 MG PO TABS: 1.0000 | ORAL_TABLET | Freq: Every evening | ORAL | Status: DC | PRN

## 2016-08-04 MED ORDER — ASPIRIN 325 MG PO TABS
325.0000 mg | ORAL_TABLET | Freq: Every day | ORAL | Status: DC
  Administered 2016-08-06 – 2016-08-08 (×3): 325 mg via ORAL
  Filled 2016-08-04 (×3): qty 1

## 2016-08-04 MED ORDER — GLUCOSE 40 % PO GEL
ORAL | Status: AC
  Administered 2016-08-04
  Filled 2016-08-04: qty 1

## 2016-08-04 MED ORDER — INSULIN ASPART 100 UNIT/ML ~~LOC~~ SOLN
0.0000 [IU] | SUBCUTANEOUS | Status: DC
  Administered 2016-08-04 (×2): 8 [IU] via SUBCUTANEOUS
  Administered 2016-08-05: 5 [IU] via SUBCUTANEOUS
  Administered 2016-08-05 (×2): 3 [IU] via SUBCUTANEOUS
  Administered 2016-08-05 – 2016-08-06 (×2): 2 [IU] via SUBCUTANEOUS
  Administered 2016-08-06 (×2): 5 [IU] via SUBCUTANEOUS
  Administered 2016-08-06: 3 [IU] via SUBCUTANEOUS
  Administered 2016-08-06: 5 [IU] via SUBCUTANEOUS
  Administered 2016-08-07 – 2016-08-08 (×3): 3 [IU] via SUBCUTANEOUS
  Administered 2016-08-08: 2 [IU] via SUBCUTANEOUS
  Filled 2016-08-04: qty 1

## 2016-08-04 MED ORDER — MORPHINE SULFATE (PF) 2 MG/ML IV SOLN
INTRAVENOUS | Status: AC
  Administered 2016-08-07: 1 mg via INTRAVENOUS
  Filled 2016-08-04: qty 1

## 2016-08-04 MED ORDER — IOPAMIDOL (ISOVUE-370) INJECTION 76%
75.0000 mL | Freq: Once | INTRAVENOUS | Status: AC | PRN
  Administered 2016-08-04: 60 mL via INTRAVENOUS

## 2016-08-04 MED ORDER — DEXTROSE 5 % IV SOLN
INTRAVENOUS | Status: DC
  Administered 2016-08-04: via INTRAVENOUS

## 2016-08-04 MED ORDER — ASPIRIN 300 MG RE SUPP
300.0000 mg | Freq: Every day | RECTAL | Status: DC
  Administered 2016-08-04 – 2016-08-05 (×2): 300 mg via RECTAL
  Filled 2016-08-04 (×3): qty 1

## 2016-08-04 MED ORDER — STROKE: EARLY STAGES OF RECOVERY BOOK
Freq: Once | Status: DC
  Filled 2016-08-04: qty 1

## 2016-08-04 MED ORDER — DONEPEZIL HCL 5 MG PO TABS: 10.0000 mg | ORAL_TABLET | Freq: Every day | ORAL | Status: DC

## 2016-08-04 MED ORDER — SODIUM CHLORIDE 0.9 % IV SOLN
INTRAVENOUS | Status: DC
  Administered 2016-08-04 – 2016-08-06 (×2): via INTRAVENOUS

## 2016-08-04 NOTE — ED Notes (Addendum)
Tele-neurologist back on line to talk to patient. Patient states that she remembers waking at 4am and looking at wrist watch. Patient states had headache and neck pain at the time but is unsure of any difficulty swallowing because she didn't try. Patient asked if she went back to sleep. Patient states she can't remember but sure she tried because she had no one to talk to that early. Patient unsure if she was woke this morning for her medications.

## 2016-08-04 NOTE — Progress Notes (Signed)
Code Stroke CT 0737 beeper [phone] exam started 0750 exam finished 0752 images sent to [place] exam completed in [facility] called [facility] called twice before exam was complete

## 2016-08-04 NOTE — ED Notes (Signed)
Patient leaving ED for CT Angio.

## 2016-08-04 NOTE — ED Notes (Signed)
Patient back from CT-reported to Nolon Stalls RN that patient "spitting pink tinged saliva). Patient suctioned with yanker by Nolon Stalls and [facility] elevated-none noted at this time. EDP-made aware.

## 2016-08-04 NOTE — ED Triage Notes (Signed)
Pt last seen normal at 0500 on 08/04/16 at med pass. Pt found with left sided weakness. BP 170/90. CBG 204.  IV established. MD Mcmanus cleared pt for CT.

## 2016-08-04 NOTE — H&P (Signed)
History and Physical    Alicia Mason [id number] DOB: Nov 12, 1922 DOA: 08/04/2016  PCP: Glo Herring., MD  Patient coming from: SNF  Chief Complaint: Left sided weakness  HPI: Alicia Mason is a 82 y.o. female with multiple comorbidities, including having history of atrial fibrillation, systolic heart failure last transthoracic echocardiogram performed in 2015 that revealed reduced ejection fraction of 40-45%, history of severe GI bleed for which Xarelto was discontinued, insulin-dependent diabetes mellitus, hypertension, presented as a transfer to the emergency department from her skilled nursing facility. This morning she was found to have left-sided weakness associated with slurred speech by nursing home staff, last seen normal at 5 AM with medicine administration. Nursing home staff reporting that she had difficulties swallowing and had been slurring her words which was not baseline for her. History is limited from her, I obtained the above history from emergency room staff, nursing home staff, family members  ED Course: In the emergency department she was found to have left-sided hemiparesis, code CVA was called.  Review of Systems: As per HPI otherwise 10 point review of systems negative. She had a CT scan of brain performed in the emergency department that showed no emergent large vessel occlusion, had high-grade right M2-3 Sigma stenosis. I spoke with Dr.Doonquah of neurology. She was not felt to be a TPA candidate.    Past Medical History:  Diagnosis Date  . Acute ischemic stroke ([place]) 08/04/2013   Right MCA stroke  . Acute on chronic combined systolic and diastolic heart failure ([place]) 10/01/2014   EF [id number]; grade 2 diastolic dysfunction  . Arthritis   . Atrial fibrillation ([place]) 05/28/2009   Paroxysmal    . Cerebrovascular disease   . Chronic anxiety   . Closed fracture of right humerus    Managed conservatively 2011  . Colon polyps 07/08/2015  . Coronary  atherosclerosis of native coronary artery 1999   CABG LIMA to LAD, SVG to OM1 and OM2 and SVG to PDA in 1999,   . Dementia   . Diabetes mellitus   . Dieulafoy lesion of colon 07/08/2015  . Dyslipidemia   . Essential hypertension, benign   . Ischemic cardiomyopathy    45-50% 07/2013/ EF 40% per Echo 06/2011.  Marland Kitchen Near syncope   . NSTEMI (non-ST elevated myocardial infarction) ([place])    10/02/2014 & 06/2011.  Marland Kitchen Pyloric stenosis 07/08/2015  . Seasonal allergies   . Type 2 diabetes mellitus ([place])   . Vertigo     Past Surgical History:  Procedure Laterality Date  . ABDOMINAL HYSTERECTOMY     WITH BSO.  . Cataract surgery    . COLONOSCOPY N/A 07/07/2015   Procedure: COLONOSCOPY;  Surgeon: Danie Binder, MD;  Location: [facility];  Service: Endoscopy;  Laterality: N/A;  . CORONARY ARTERY BYPASS GRAFT  1999   LIMA to LAD, SVG to OM1 and OM2, SVG to PDA  . ESOPHAGOGASTRODUODENOSCOPY N/A 07/07/2015   Procedure: ESOPHAGOGASTRODUODENOSCOPY (EGD);  Surgeon: Danie Binder, MD;  Location: [facility];  Service: Endoscopy;  Laterality: N/A;  . ORIF HIP FRACTURE Left 05/30/2014   Procedure: COMPRESSION HIP SCREW LEFT HIP;  Surgeon: Sanjuana Kava, MD;  Location: [facility];  Service: Orthopedics;  Laterality: Left;     reports that she has never smoked. She has never used smokeless tobacco. She reports that she does not drink alcohol or use drugs.  Allergies  Allergen Reactions  . Cephalexin     NOT SURE OF THE REACTION, BUT WAS  TOLD NOT TO TAKE IT. Granddaughter mentioned that patient had hives, rash. Denies anaphylactic reaction.    Family History  Problem Relation Age of Onset  . Pneumonia Mother   . CAD Sister     Died with MI age 52  . Arthritis Other   . Coronary artery disease Other   . Diabetes Other   . Cancer Other   . Colon cancer Neg Hx      Prior to Admission medications   Medication Sig Start Date End Date Taking? Authorizing Provider  ALPRAZolam Duanne Moron) 0.5 MG tablet Take  1 tablet (0.5 mg total) by mouth 2 (two) times daily as needed for anxiety or sleep. 12/26/14  Yes Tanna Furry, MD  aluminum-magnesium hydroxide-simethicone (MAALOX) 200-200-20 MG/5ML SUSP Take 15 mLs by mouth daily as needed (heartburn).   Yes Historical Provider, MD  cetirizine (ZYRTEC) 10 MG tablet Take 10 mg by mouth daily.   Yes Historical Provider, MD  Dextrose, Diabetic Use, (GLUCOSE) 77.4 % GEL Take 1 Applicatorful by mouth daily as needed (hypoglycemia).   Yes Historical Provider, MD  donepezil (ARICEPT) 10 MG tablet Take 10 mg by mouth at bedtime.   Yes Historical Provider, MD  furosemide (LASIX) 20 MG tablet Take 1 tablet (20 mg total) by mouth 2 (two) times daily. For treatment of heart failure. Patient taking differently: Take 20 mg by mouth daily. For treatment of heart failure. 07/10/15  Yes Rexene Alberts, MD  HYDROcodone-acetaminophen (NORCO/VICODIN) 5-325 MG per tablet Take 1 tablet by mouth daily as needed for moderate pain.    Yes Historical Provider, MD  Insulin Glargine (LANTUS SOLOSTAR) 100 UNIT/ML Solostar Pen Inject 10 Units into the skin every morning.    Yes Historical Provider, MD  loperamide (IMODIUM A-D) 2 MG tablet Take 2 mg by mouth every 6 (six) hours as needed for diarrhea or loose stools.   Yes Historical Provider, MD  metoprolol tartrate (LOPRESSOR) 25 MG tablet Take 25 mg by mouth daily.   Yes Historical Provider, MD  Phenylephrine-DM-GG (ROBITUSSIN CHILD COUGH/COLD CF) 2.03-24-49 MG/5ML LIQD Take 20 mLs by mouth 3 (three) times daily as needed (cough).   Yes Historical Provider, MD  potassium chloride (K-DUR) 10 MEQ tablet Take 1 tablet (10 mEq total) by mouth 2 (two) times daily. Potassium. Take each tablet with furosemide twice daily. Patient taking differently: Take 10 mEq by mouth daily. Potassium. Take each tablet with furosemide twice daily. 07/10/15  Yes Rexene Alberts, MD  ranitidine (ZANTAC) 150 MG tablet Take 150 mg by mouth daily as needed for heartburn.   Yes  Historical Provider, MD  metoprolol (LOPRESSOR) 50 MG tablet Take 1 tablet (50 mg total) by mouth 2 (two) times daily. Patient not taking: Reported on 08/04/2016 06/01/14   Nita Sells, MD    Physical Exam: Vitals:   08/04/16 1015 08/04/16 1030 08/04/16 1045 08/04/16 1145  BP: 138/92 (!) 152/117 146/94 153/88  Pulse: 100 113  95  Resp: 17 20 18 13   Temp:      TempSrc:      SpO2: 100% 99% 98% 98%  Weight:      Height:          Constitutional: She is confused, disoriented Vitals:   08/04/16 1015 08/04/16 1030 08/04/16 1045 08/04/16 1145  BP: 138/92 (!) 152/117 146/94 153/88  Pulse: 100 113  95  Resp: 17 20 18 13   Temp:      TempSrc:      SpO2: 100% 99% 98% 98%  Weight:  Height:       Eyes: PERRL, lids and conjunctivae normal ENMT: Mucous membranes are moist. Posterior pharynx clear of any exudate or lesions.Normal dentition.  Neck: normal, supple, no masses, no thyromegaly Respiratory: clear to auscultation bilaterally, no wheezing, no crackles. Normal respiratory effort. No accessory muscle use.  Cardiovascular: Tachycardic iregular rate and rhythm, no murmurs / rubs / gallops. No extremity edema. 2+ pedal pulses. No carotid bruits.  Abdomen: no tenderness, no masses palpated. No hepatosplenomegaly. Bowel sounds positive.  Musculoskeletal: no clubbing / cyanosis. No joint deformity upper and lower extremities. Good ROM, no contractures. Normal muscle tone.  Skin: no rashes, lesions, ulcers. No induration Neurologic: She appears to have left-sided hemi-neglect, eyes did not move beyond midline to left. There is left-sided facial droop and slurred speech. She has flaccid paralysis of her left upper extremity with 4-5 muscle strength to her left lower extremity. At times she has difficulties following commands. Absent deep tendon reflexes 2 left upper extremity, intact deep tendon reflexes to right upper and bilateral lower extremities Psychiatric: She is confused and  disoriented    Labs on Admission: I have personally reviewed following labs and imaging studies  CBC:  Recent Labs Lab 08/04/16 0738 08/04/16 0746  WBC 9.5  --   NEUTROABS 7.0  --   HGB 11.9* 13.3  HCT 36.8 39.0  MCV 88.9  --   PLT 252  --    Basic Metabolic Panel:  Recent Labs Lab 08/04/16 0738 08/04/16 0746  NA 137 138  K 4.2 4.2  CL 98* 96*  CO2 28  --   GLUCOSE 274* 270*  BUN 25* 24*  CREATININE 1.01* 1.10*  CALCIUM 8.8*  --    GFR: Estimated Creatinine Clearance: 29.4 mL/min (by C-G formula based on SCr of 1.1 mg/dL (H)). Liver Function Tests:  Recent Labs Lab 08/04/16 0738  AST 20  ALT 16  ALKPHOS 108  BILITOT 1.0  PROT 7.5  ALBUMIN 4.2   No results for input(s): LIPASE, AMYLASE in the last 168 hours. No results for input(s): AMMONIA in the last 168 hours. Coagulation Profile:  Recent Labs Lab 08/04/16 0738  INR 1.00   Cardiac Enzymes: No results for input(s): CKTOTAL, CKMB, CKMBINDEX, TROPONINI in the last 168 hours. BNP (last 3 results) No results for input(s): PROBNP in the last 8760 hours. HbA1C: No results for input(s): HGBA1C in the last 72 hours. CBG: No results for input(s): GLUCAP in the last 168 hours. Lipid Profile: No results for input(s): CHOL, HDL, LDLCALC, TRIG, CHOLHDL, LDLDIRECT in the last 72 hours. Thyroid Function Tests: No results for input(s): TSH, T4TOTAL, FREET4, T3FREE, THYROIDAB in the last 72 hours. Anemia Panel: No results for input(s): VITAMINB12, FOLATE, FERRITIN, TIBC, IRON, RETICCTPCT in the last 72 hours. Urine analysis:    Component Value Date/Time   COLORURINE YELLOW 04/14/2016 1425   APPEARANCEUR HAZY (A) 04/14/2016 1425   LABSPEC 1.020 04/14/2016 1425   PHURINE 5.5 04/14/2016 1425   GLUCOSEU >1000 (A) 04/14/2016 1425   HGBUR TRACE (A) 04/14/2016 1425   BILIRUBINUR NEGATIVE 04/14/2016 1425   KETONESUR NEGATIVE 04/14/2016 1425   PROTEINUR NEGATIVE 04/14/2016 1425   UROBILINOGEN 0.2 07/09/2015  1718   NITRITE POSITIVE (A) 04/14/2016 1425   LEUKOCYTESUR SMALL (A) 04/14/2016 1425   Sepsis Labs: !!!!!!!!!!!!!!!!!!!!!!!!!!!!!!!!!!!!!!!!!!!! @LABRCNTIP (procalcitonin:4,lacticidven:4) )No results found for this or any previous visit (from the past 240 hour(s)).   Radiological Exams on Admission: Ct Angio Head W Or Wo Contrast  Result Date: 08/04/2016 CLINICAL  DATA:  Altered mental status.  Difficulty swallowing. EXAM: CT ANGIOGRAPHY HEAD TECHNIQUE: Multidetector CT imaging of the head was performed using the standard protocol during bolus administration of intravenous contrast. Multiplanar CT image reconstructions and MIPs were obtained to evaluate the vascular anatomy. CONTRAST:  60 cc Isovue 370 intravenous COMPARISON:  Head CT from earlier today FINDINGS: CTA HEAD Anterior circulation: Symmetric carotid arteries and branching. Incomplete circle-of-Willis at the left posterior communicating artery. Diffuse atherosclerotic calcification on the carotid siphons with calcific plaque blooming partially obscuring the luminal contrast. No proximal occlusion. No reversible flow limiting stenosis. There is a segment of weak flow within a right insular branch, M2-3 level. Negative for aneurysm. Posterior circulation: Left dominant vertebral artery. Standard vertebrobasilar branching. Focal high-grade left P1/2 segment stenosis. No major branch occlusion. The vertebral and basilar arteries are smooth and widely patent. Atherosclerotic calcified plaque on the proximal V4 segments. Venous sinuses: Patent where opacified. Anatomic variants: No notable Delayed phase:Enhancing 18 mm meningioma without mass effect located along the left petrous temporal bone. Small focus of internal calcification. No concerning growth when compared to priors. Remote ischemic injury and atrophy as described on preceding noncontrast head CT. Right insular indistinctness suspected on the previous scan is less convincing on this scan  which is less affected by motion. IMPRESSION: 1. No emergent large vessel occlusion. 2. High-grade right M2-3 segment stenosis which could be from atherosclerosis or thrombus. 3. Notable atheromatous proximal left PCA stenosis. 4. 18 mm meningioma in the left posterior fossa without mass effect Electronically Signed   By: Monte Fantasia M.D.   On: 08/04/2016 10:05   Ct Head Code Stroke W/o Cm  Result Date: 08/04/2016 CLINICAL DATA:  Code stroke. Altered mental status. Last seen normal 5 a.m. today. EXAM: CT HEAD WITHOUT CONTRAST TECHNIQUE: Contiguous axial images were obtained from the base of the skull through the vertex without intravenous contrast. COMPARISON:  07/12/2015 FINDINGS: Motion degraded exam. Indistinct posterior right insula. No hemorrhage, swelling, hydrocephalus, or shift. Deep Mason nuclei are stable from prior, chronically indistinct. Since previous there have been biparietal cortically based infarcts, extending into the occipital lobe on the left. Atrophy with prominent mesial temporal and parietal involvement. This pattern can be seen with Alzheimer's disease. Meningioma along the posterior left petrous temporal bone measuring 15 mm on previous study, stable as permitted by artifact. ASPECTS ([place] Stroke Program Early CT Score) - Ganglionic level infarction (caudate, lentiform nuclei, internal capsule, insula, M1-M3 cortex): 6 - Supraganglionic infarction (M4-M6 cortex): 3 Total score (0-10 with 10 being normal): 9 These results were called by telephone at the time of interpretation on 08/04/2016 at 8:03 am to Dr. Francine Graven , who verbally acknowledged these results. IMPRESSION: 1. Motion degraded exam. 2. Negative for hemorrhage. Indistinct right insula which could be motion artifact or ischemic. ASPECTS is 9 at worst. 3. Chronic biparietal infarct which has occurred since 2016. 4. Brain atrophy as described above. 5. Stable approximately 15 mm meningioma along the left petrous  bone. Electronically Signed   By: Monte Fantasia M.D.   On: 08/04/2016 08:09    EKG: Independently reviewed.   Assessment/Plan Active Problems:   Atrial fibrillation (H[facility])   DM type 2 (diabetes mellitus, type 2) ([place])   Acute encephalopathy   Essential hypertension   Acute CVA (cerebrovascular accident) ([place])   Chronic systolic (congestive) heart failure ([place])   1.  Acute CVA. Alicia Mason is a pleasant 82 year old female with a history of paroxysmal atrial fibrillation who had been  on Xarelto until 2016 at which time this was discontinued due to GI bleed presented with slurred speech and left hemiparesis. A code CVA was called. Will admit her to telemetry place her on the CVA protocol. Initiate antiplatelet therapy with aspirin. CT angio of head performed in the emergency department revealed right high-grade and 2-3 segment stenosis. Will further workup with MRI of brain along with carotid Dopplers and transthoracic echocardiogram. Keep her nothing by mouth until undergoing swallow screen. Await further recommendations from neurology. I spoke with Dr. Merlene Laughter.    2.  History of paroxysmal atrial fibrillation. CHADSVasc score of at least 5 EKG on admission showing A. fib with ventricular rates in the 90s. She had been anticoagulated with Xarelto until last year at which time she presented with severe GI bleed and thus Xarelto was discontinued. Now presenting with acute CVA. Continue metoprolol 25 mg twice a day.  3.  Insulin-dependent diabetes mellitus. Since she presents with slurred speech and CVA, will keep her nothing by mouth until swallow screen. Provide sliding scale coverage every 4 hours.  4.  History of chronic systolic congestive heart failure. Last transthoracic echocardiogram performed on 10/02/2014 that showed EF of 40-45%. She will be made nothing by mouth, provide maintenance fluids for now, monitor her volume status closely  5.  History of GI bleed. She presented last year  with a GI bleed having hemoglobin of 5.7 at which time anticoagulation was discontinued. Lab work done today showed hemoglobin of 13.3.   DVT prophylaxis: Lovenox Code Status: DO NOT RESUSCITATE, this was confirmed with her granddaughter Judson Roch Family Communication: I spoke with her granddaughter Judson Roch at telephone [phone] Disposition Plan: Will admit to inpatient, anticipate she'll require greater than 2 nights hospitalization Consults called: Neurology Admission status: Inpatient   Kelvin Cellar MD [facility] Pager [phone]  If 7PM-7AM, please contact night-coverage [email or web address] Password [facility]  08/04/2016, 11:46 AM

## 2016-08-04 NOTE — ED Notes (Signed)
Hospitalitis paged about pt's heart rate currently having periods of increasing to 130s and then falling back to 80s. Pt also c/o back pain and is restless, MD notified.

## 2016-08-04 NOTE — ED Notes (Signed)
cbg of 272.

## 2016-08-04 NOTE — Consult Note (Signed)
[place] A. Merlene Laughter, MD     [email or web address]          Alicia Mason is an 82 y.o. female.   ASSESSMENT/PLAN: 1. Moderate-sized right MCA infarct due to chronic untreated atrial fibrillation. Overall prognosis for functional recovery poor given baseline comorbidities including advanced dementia and age. 2. Chronic atrial fibrillation untreated with anticoagulation due to severe GI bleed resulting in significant drop in hemoglobin and blood transfusion. Also history of chronic falls. 3. Baseline dementia both Alzheimer's type and vascular dementia.  RECOMMENDATION: Agree with aspirin antiplatelet agent. Consider hospice consult and deceleration of care.     The patient is a 82 year old white female who presents to the hospital from nursing home with acute left hemiparesis. The patient has dementia at baseline. She has a history of chronic atrial fibrillation but has not been anticoagulated because of severe GI bleed a year ago requiring packed RBC with hemoglobin dropping from 11-5. The patient reports that she's feeling well but is quite demented and cannot provide a history.      GENERAL:  Thin female laying in bed but responsive to verbal commands.  HEENT: Mild to moderate axial rigidity appreciated.   ABDOMEN: soft  EXTREMITIES: No edema   BACK: Unremarkable  SKIN: Normal by inspection.    MENTAL STATUS: She lays in bed with eyes closed. Follows verbal commands. She does follow commands. She has moderate to  severe dysarthria. She is oriented to name only.  CRANIAL NERVES: Pupils are surgically shaped irregular and nonreactive to light; extra ocular movements are full although there appears to be apraxia of movements to the left overcome passively, there is no significant nystagmus; visual fields appears to show significant impairment left visual field and right visual field suggestive of cortical visual loss; upper and lower facial muscles are  normal in strength and symmetric, there is no flattening of the nasolabial folds; tongue is midline.  MOTOR: Left upper extremity strength 0/5 and left leg 3/5. There is severe drift involving left upper extremity and moderate drift left leg. Right upper extremity 4+/5 although exact strength is limited due to cooperation issues. Right leg 4/5. There is a moderate drift right leg. No drift of the right upper extremity.  COORDINATION: Right finger to nose is normal, No rest tremor; no intention tremor; no postural tremor; no bradykinesia.  REFLEXES: Deep tendon reflexes are symmetrical and normal.   SENSATION: There is neglect on the left side. Seem to be diminished response to pain bilaterally.    NIH stroke scale 14.    Blood pressure 115/75, pulse 90, temperature 98.7 F (37.1 C), temperature source Oral, resp. rate 20, height _0  (1.651 m), weight 120 lb 12.8 oz (54.8 kg), SpO2 100 %.  Past Medical History:  Diagnosis Date  . Acute ischemic stroke ([place]) 08/04/2013   Right MCA stroke  . Acute on chronic combined systolic and diastolic heart failure ([place]) 10/01/2014   EF 21-19%; grade 2 diastolic dysfunction  . Arthritis   . Atrial fibrillation ([place]) 05/28/2009   Paroxysmal    . Cerebrovascular disease   . Chronic anxiety   . Closed fracture of right humerus    Managed conservatively 2011  . Colon polyps 07/08/2015  . Coronary atherosclerosis of native coronary artery 1999   CABG LIMA to LAD, SVG to OM1 and OM2 and SVG to PDA in 1999,   . Dementia   . Diabetes mellitus   . Dieulafoy lesion of colon 07/08/2015  . Dyslipidemia   .  Essential hypertension, benign   . Ischemic cardiomyopathy    45-50% 07/2013/ EF 40% per Echo 06/2011.  Marland Kitchen Near syncope   . NSTEMI (non-ST elevated myocardial infarction) ([place])    10/02/2014 & 06/2011.  Marland Kitchen Pyloric stenosis 07/08/2015  . Seasonal allergies   . Type 2 diabetes mellitus ([place])   . Vertigo     Past Surgical History:  Procedure Laterality  Date  . ABDOMINAL HYSTERECTOMY     WITH BSO.  . Cataract surgery    . COLONOSCOPY N/A 07/07/2015   Procedure: COLONOSCOPY;  Surgeon: Danie Binder, MD;  Location: [facility];  Service: Endoscopy;  Laterality: N/A;  . CORONARY ARTERY BYPASS GRAFT  1999   LIMA to LAD, SVG to OM1 and OM2, SVG to PDA  . ESOPHAGOGASTRODUODENOSCOPY N/A 07/07/2015   Procedure: ESOPHAGOGASTRODUODENOSCOPY (EGD);  Surgeon: Danie Binder, MD;  Location: [facility];  Service: Endoscopy;  Laterality: N/A;  . ORIF HIP FRACTURE Left 05/30/2014   Procedure: COMPRESSION HIP SCREW LEFT HIP;  Surgeon: Sanjuana Kava, MD;  Location: [facility];  Service: Orthopedics;  Laterality: Left;    Family History  Problem Relation Age of Onset  . Pneumonia Mother   . CAD Sister     Died with MI age 39  . Arthritis Other   . Coronary artery disease Other   . Diabetes Other   . Cancer Other   . Colon cancer Neg Hx     Social History:  reports that she has never smoked. She has never used smokeless tobacco. She reports that she does not drink alcohol or use drugs.  Allergies:  Allergies  Allergen Reactions  . Cephalexin     NOT SURE OF THE REACTION, BUT WAS TOLD NOT TO TAKE IT. Granddaughter mentioned that patient had hives, rash. Denies anaphylactic reaction.    Medications: Prior to Admission medications   Medication Sig Start Date End Date Taking? Authorizing Provider  ALPRAZolam Duanne Moron) 0.5 MG tablet Take 1 tablet (0.5 mg total) by mouth 2 (two) times daily as needed for anxiety or sleep. 12/26/14  Yes Tanna Furry, MD  aluminum-magnesium hydroxide-simethicone (MAALOX) 200-200-20 MG/5ML SUSP Take 15 mLs by mouth daily as needed (heartburn).   Yes Historical Provider, MD  cetirizine (ZYRTEC) 10 MG tablet Take 10 mg by mouth daily.   Yes Historical Provider, MD  Dextrose, Diabetic Use, (GLUCOSE) 77.4 % GEL Take 1 Applicatorful by mouth daily as needed (hypoglycemia).   Yes Historical Provider, MD  donepezil (ARICEPT) 10 MG  tablet Take 10 mg by mouth at bedtime.   Yes Historical Provider, MD  furosemide (LASIX) 20 MG tablet Take 1 tablet (20 mg total) by mouth 2 (two) times daily. For treatment of heart failure. Patient taking differently: Take 20 mg by mouth daily. For treatment of heart failure. 07/10/15  Yes Rexene Alberts, MD  HYDROcodone-acetaminophen (NORCO/VICODIN) 5-325 MG per tablet Take 1 tablet by mouth daily as needed for moderate pain.    Yes Historical Provider, MD  Insulin Glargine (LANTUS SOLOSTAR) 100 UNIT/ML Solostar Pen Inject 10 Units into the skin every morning.    Yes Historical Provider, MD  loperamide (IMODIUM A-D) 2 MG tablet Take 2 mg by mouth every 6 (six) hours as needed for diarrhea or loose stools.   Yes Historical Provider, MD  metoprolol tartrate (LOPRESSOR) 25 MG tablet Take 25 mg by mouth daily.   Yes Historical Provider, MD  Phenylephrine-DM-GG (ROBITUSSIN CHILD COUGH/COLD CF) 2.03-24-49 MG/5ML LIQD Take 20 mLs by mouth 3 (  three) times daily as needed (cough).   Yes Historical Provider, MD  potassium chloride (K-DUR) 10 MEQ tablet Take 1 tablet (10 mEq total) by mouth 2 (two) times daily. Potassium. Take each tablet with furosemide twice daily. Patient taking differently: Take 10 mEq by mouth daily. Potassium. Take each tablet with furosemide twice daily. 07/10/15  Yes Rexene Alberts, MD  ranitidine (ZANTAC) 150 MG tablet Take 150 mg by mouth daily as needed for heartburn.   Yes Historical Provider, MD  metoprolol (LOPRESSOR) 50 MG tablet Take 1 tablet (50 mg total) by mouth 2 (two) times daily. Patient not taking: Reported on 08/04/2016 06/01/14   Nita Sells, MD    Scheduled Meds: .  stroke: mapping our early stages of recovery book   Does not apply Once  . aspirin  300 mg Rectal Daily   Or  . aspirin  325 mg Oral Daily  . enoxaparin (LOVENOX) injection  30 mg Subcutaneous Q24H  . insulin aspart  0-15 Units Subcutaneous Q4H   Continuous Infusions: . sodium chloride 75 mL/hr  at 08/04/16 1334   PRN Meds:.morphine injection, senna-docusate     Results for orders placed or performed during the hospital encounter of 08/04/16 (from the past 48 hour(s))  CBG monitoring, ED     Status: Abnormal   Collection Time: 08/04/16  7:37 AM  Result Value Ref Range   Glucose-Capillary 272 (H) 65 - 99 mg/dL  Protime-INR     Status: None   Collection Time: 08/04/16  7:38 AM  Result Value Ref Range   Prothrombin Time 13.2 11.4 - 15.2 seconds   INR 1.00   APTT     Status: None   Collection Time: 08/04/16  7:38 AM  Result Value Ref Range   aPTT 32 24 - 36 seconds  CBC     Status: Abnormal   Collection Time: 08/04/16  7:38 AM  Result Value Ref Range   WBC 9.5 4.0 - 10.5 K/uL   RBC 4.14 3.87 - 5.11 MIL/uL   Hemoglobin 11.9 (L) 12.0 - 15.0 g/dL   HCT 36.8 36.0 - 46.0 %   MCV 88.9 78.0 - 100.0 fL   MCH 28.7 26.0 - 34.0 pg   MCHC 32.3 30.0 - 36.0 g/dL   RDW 14.6 11.5 - 15.5 %   Platelets 252 150 - 400 K/uL  Differential     Status: None   Collection Time: 08/04/16  7:38 AM  Result Value Ref Range   Neutrophils Relative % 74 %   Neutro Abs 7.0 1.7 - 7.7 K/uL   Lymphocytes Relative 17 %   Lymphs Abs 1.6 0.7 - 4.0 K/uL   Monocytes Relative 6 %   Monocytes Absolute 0.5 0.1 - 1.0 K/uL   Eosinophils Relative 3 %   Eosinophils Absolute 0.3 0.0 - 0.7 K/uL   Basophils Relative 0 %   Basophils Absolute 0.0 0.0 - 0.1 K/uL  Comprehensive metabolic panel     Status: Abnormal   Collection Time: 08/04/16  7:38 AM  Result Value Ref Range   Sodium 137 135 - 145 mmol/L   Potassium 4.2 3.5 - 5.1 mmol/L   Chloride 98 (L) 101 - 111 mmol/L   CO2 28 22 - 32 mmol/L   Glucose, Bld 274 (H) 65 - 99 mg/dL   BUN 25 (H) 6 - 20 mg/dL   Creatinine, Ser 1.01 (H) 0.44 - 1.00 mg/dL   Calcium 8.8 (L) 8.9 - 10.3 mg/dL   Total Protein 7.5 6.5 -  8.1 g/dL   Albumin 4.2 3.5 - 5.0 g/dL   AST 20 15 - 41 U/L   ALT 16 14 - 54 U/L   Alkaline Phosphatase 108 38 - 126 U/L   Total Bilirubin 1.0 0.3 -  1.2 mg/dL   GFR calc non Af Amer 47 (L) >60 mL/min   GFR calc Af Amer 54 (L) >60 mL/min    Comment: (NOTE) The eGFR has been calculated using the CKD EPI equation. This calculation has not been validated in all clinical situations. eGFR's persistently <60 mL/min signify possible Chronic Kidney Disease.    Anion gap 11 5 - 15  I-stat troponin, ED     Status: None   Collection Time: 08/04/16  7:44 AM  Result Value Ref Range   Troponin i, poc 0.02 0.00 - 0.08 ng/mL   Comment 3            Comment: Due to the release kinetics of cTnI, a negative result within the first hours of the onset of symptoms does not rule out myocardial infarction with certainty. If myocardial infarction is still suspected, repeat the test at appropriate intervals.   I-Stat Chem 8, ED     Status: Abnormal   Collection Time: 08/04/16  7:46 AM  Result Value Ref Range   Sodium 138 135 - 145 mmol/L   Potassium 4.2 3.5 - 5.1 mmol/L   Chloride 96 (L) 101 - 111 mmol/L   BUN 24 (H) 6 - 20 mg/dL   Creatinine, Ser 1.10 (H) 0.44 - 1.00 mg/dL   Glucose, Bld 270 (H) 65 - 99 mg/dL   Calcium, Ion 1.08 (L) 1.15 - 1.40 mmol/L   TCO2 28 0 - 100 mmol/L   Hemoglobin 13.3 12.0 - 15.0 g/dL   HCT 39.0 36.0 - 46.0 %  CBG monitoring, ED     Status: Abnormal   Collection Time: 08/04/16  1:19 PM  Result Value Ref Range   Glucose-Capillary 286 (H) 65 - 99 mg/dL  CBG monitoring, ED     Status: Abnormal   Collection Time: 08/04/16  4:00 PM  Result Value Ref Range   Glucose-Capillary 255 (H) 65 - 99 mg/dL  MRSA PCR Screening     Status: None   Collection Time: 08/04/16  6:14 PM  Result Value Ref Range   MRSA by PCR NEGATIVE NEGATIVE    Comment:        The GeneXpert MRSA Assay (FDA approved for NASAL specimens only), is one component of a comprehensive MRSA colonization surveillance program. It is not intended to diagnose MRSA infection nor to guide or monitor treatment for MRSA infections.   Glucose, capillary      Status: None   Collection Time: 08/04/16  8:29 PM  Result Value Ref Range   Glucose-Capillary 68 65 - 99 mg/dL    Studies/Results:  Head CT FINDINGS: Motion degraded exam.  Indistinct posterior right insula. No hemorrhage, swelling, hydrocephalus, or shift. Deep gray nuclei are stable from prior, chronically indistinct. Since previous there have been biparietal cortically based infarcts, extending into the occipital lobe on the left. Atrophy with prominent mesial temporal and parietal involvement. This pattern can be seen with Alzheimer's disease. Meningioma along the posterior left petrous temporal bone measuring 15 mm on previous study, stable as permitted by artifact.    BRAIN CTA FINDINGS: CTA HEAD  Anterior circulation: Symmetric carotid arteries and branching. Incomplete circle-of-Willis at the left posterior communicating artery. Diffuse atherosclerotic calcification on the carotid siphons with calcific  plaque blooming partially obscuring the luminal contrast. No proximal occlusion. No reversible flow limiting stenosis. There is a segment of weak flow within a right insular branch, M2-3 level. Negative for aneurysm.  Posterior circulation: Left dominant vertebral artery. Standard vertebrobasilar branching. Focal high-grade left P1/2 segment stenosis. No major branch occlusion. The vertebral and basilar arteries are smooth and widely patent. Atherosclerotic calcified plaque on the proximal V4 segments.  Venous sinuses: Patent where opacified.  Anatomic variants: No notable  Delayed phase:Enhancing 18 mm meningioma without mass effect located along the left petrous temporal bone. Small focus of internal calcification. No concerning growth when compared to priors.  Remote ischemic injury and atrophy as described on preceding noncontrast head CT. Right insular indistinctness suspected on the previous scan is less convincing on this scan which is less  affected by motion.  IMPRESSION: 1. No emergent large vessel occlusion. 2. High-grade right M2-3 segment stenosis which could be from atherosclerosis or thrombus. 3. Notable atheromatous proximal left PCA stenosis. 4. 18 mm meningioma in the left posterior fossa without mass effect     BRAIN MRA MRI FINDINGS: MRI HEAD FINDINGS  The study is moderately motion degraded.  There is a small acute right MCA infarct involving the posterior right frontal lobe, predominantly the operculum, as well as a portion of the right insula. No gross intracranial hemorrhage, intra-axial mass, midline shift, or extra-axial fluid collection is seen. There is moderate cerebral atrophy. Chronic bilateral parietal cortical infarcts are new from the prior MRI. T2 hyperintensities elsewhere in the deep cerebral white matter bilaterally are similar to the prior MRI and compatible with chronic small vessel ischemia. Extra-axial mass in the posterior fossa along the petrous temporal bone measures 19 x 12 mm, slightly smaller than on the prior MRI.  Prior bilateral cataract extraction is noted. Mild mucosal thickening and small to moderate volume fluid in the right maxillary sinus. Clear mastoid air cells. Major intracranial vascular flow voids are grossly preserved. 15 mm T2 hyperintense is stable to minimally larger than on the prior MRI, favored to be benign.  MRA HEAD FINDINGS  The study is moderately to severely motion degraded.  The V4 segments of the vertebral arteries are not well evaluated due to motion artifact. Flow is grossly identified in the dominant left vertebral artery. The basilar artery is patent without evidence of significant stenosis. There is a patent right posterior communicating artery with hypoplastic right P1 segment. The left P1 segment is patent with a high-grade stenosis again seen near the P1-P2 junction. Artifact precludes assessment of the P2 in more distal  PCAs.  The internal carotid arteries are patent from skullbase to carotid termini. Focal areas of relative signal dropout in both ICAs are felt to be artifactual due to lack of high-grade stenosis on earlier CTA. There is likely mild stenosis involving the anterior genu and supraclinoid segments bilaterally.  M1 segments are patent proximally. More distal M1 segments, MCA bifurcations, and MCA branch vessels are inadequately evaluated due to motion artifact and poor signal. There is gross flow related enhancement in the A1 and A2 segments, however evaluation for underlying stenosis is severely limited by artifact.  IMPRESSION: 1. Acute right MCA infarct involving the posterior frontal lobe and insula. 2. Chronic bilateral parietal infarcts. 3. 19 mm meningioma along the left petrous temporal bone, slightly smaller than in 2014. 4. Moderately to severely motion degraded head MRA as above. Intracranial circulation much better assessed on today's earlier CTA.       CAROTID  DOPPLERS Atherosclerotic disease in the bilateral carotid arteries, most prominent at the right carotid bulb.  Estimated degree of stenosis in the internal carotid arteries is less than 50% bilaterally.          Jenipher Havel A. Merlene Laughter, M.D.  Diplomate, Tax adviser of Psychiatry and Neurology ( Neurology). 08/04/2016, 9:45 PM

## 2016-08-04 NOTE — ED Notes (Signed)
Hospitalist paged at this time. Pt's BP is 168/93, HR in 120s. MD notified pt has not passed stroke swallow screen and has PO medications ordered. MD stated he would place orders at this time.

## 2016-08-04 NOTE — ED Provider Notes (Signed)
[facility] DEPT Provider Note   CSN: [id number] Arrival date & time: 08/04/16  [id number]   An emergency department physician performed an initial assessment on this suspected stroke patient at [phone].  History   Chief Complaint Chief Complaint  Patient presents with  . Code Stroke    HPI Alicia Mason is a 82 y.o. female.  The history is provided by the EMS personnel and the nursing home. The history is limited by the condition of the patient (Code Stroke).    Pt was seen on arrival to the ED. Per EMS and NH report: Pt LKW 0500 when meds were being given. Pt found approximately 10 min PTA with left sided weakness, slurred speech, left facial droop. EMS called Code Stroke en route.    Past Medical History:  Diagnosis Date  . Acute ischemic stroke ([place]) 08/04/2013   Right MCA stroke  . Acute on chronic combined systolic and diastolic heart failure ([place]) 10/01/2014   EF [id number]; grade 2 diastolic dysfunction  . Arthritis   . Atrial fibrillation ([place]) 05/28/2009   Paroxysmal    . Cerebrovascular disease   . Chronic anxiety   . Closed fracture of right humerus    Managed conservatively 2011  . Colon polyps 07/08/2015  . Coronary atherosclerosis of native coronary artery 1999   CABG LIMA to LAD, SVG to OM1 and OM2 and SVG to PDA in 1999,   . Dementia   . Diabetes mellitus   . Dieulafoy lesion of colon 07/08/2015  . Dyslipidemia   . Essential hypertension, benign   . Ischemic cardiomyopathy    45-50% 07/2013/ EF 40% per Echo 06/2011.  Marland Kitchen Near syncope   . NSTEMI (non-ST elevated myocardial infarction) ([place])    10/02/2014 & 06/2011.  Marland Kitchen Pyloric stenosis 07/08/2015  . Seasonal allergies   . Type 2 diabetes mellitus ([place])   . Vertigo     Patient Active Problem List   Diagnosis Date Noted  . Dieulafoy lesion of colon 07/08/2015  . Colon polyps 07/08/2015  . Gastritis 07/08/2015  . Pyloric stenosis 07/08/2015  . Bleeding gastrointestinal   . Anemia due to blood loss, acute   .  Rectal bleeding   . Acute on chronic combined systolic and diastolic heart failure ([place]) 10/01/2014  . Paroxysmal atrial fibrillation ([place]) 10/01/2014  . Essential hypertension 10/01/2014  . History of arterial ischemic stroke 10/01/2014  . Chronic anxiety 10/01/2014  . Anemia due to other cause 10/01/2014  . Protein-calorie malnutrition, severe ([place]) 05/29/2014  . Hip fracture 05/28/2014  . Hip fracture ([place]) 05/28/2014  . Dermatitis 09/27/2013  . Hypokalemia 09/27/2013  . Anxiety 09/27/2013  . Dyslipidemia 08/07/2013  . Abnormal thyroid blood test 08/06/2013  . Acute ischemic stroke ([place]) 08/04/2013  . Fever 08/03/2013  . Acute encephalopathy 08/03/2013  . Hypoglycemia 08/03/2013  . UTI (lower urinary tract infection) 07/30/2013  . Confusion 07/30/2013  . Ataxia 07/30/2013  . Ischemic cardiomyopathy 07/13/2011  . Bradycardia 07/12/2011  . Near syncope 07/11/2011  . NSTEMI (non-ST elevated myocardial infarction) ([place]) 07/11/2011  . CAD (coronary artery disease) 07/11/2011  . Hx of CABG 07/11/2011  . DM type 2 (diabetes mellitus, type 2) ([place]) 07/11/2011  . HTN (hypertension), malignant 07/11/2011  . Cerebrovascular disease 07/11/2011  . Vertigo 07/11/2011  . FRACTURE, HUMERUS, PROXIMAL 11/24/2009  . IDDM 05/28/2009  . HYPERTENSION 05/28/2009  . CAD 05/28/2009  . Atrial fibrillation ([place]) 05/28/2009  . GERD 05/28/2009  . Osteoarthritis 05/28/2009    Past Surgical History:  Procedure Laterality Date  . ABDOMINAL HYSTERECTOMY     WITH BSO.  . Cataract surgery    . COLONOSCOPY N/A 07/07/2015   Procedure: COLONOSCOPY;  Surgeon: Danie Binder, MD;  Location: [facility];  Service: Endoscopy;  Laterality: N/A;  . CORONARY ARTERY BYPASS GRAFT  1999   LIMA to LAD, SVG to OM1 and OM2, SVG to PDA  . ESOPHAGOGASTRODUODENOSCOPY N/A 07/07/2015   Procedure: ESOPHAGOGASTRODUODENOSCOPY (EGD);  Surgeon: Danie Binder, MD;  Location: [facility];  Service: Endoscopy;  Laterality:  N/A;  . ORIF HIP FRACTURE Left 05/30/2014   Procedure: COMPRESSION HIP SCREW LEFT HIP;  Surgeon: Sanjuana Kava, MD;  Location: [facility] ORS;  Service: Orthopedics;  Laterality: Left;       Home Medications    Prior to Admission medications   Medication Sig Start Date End Date Taking? Authorizing Provider  ALPRAZolam Duanne Moron) 0.5 MG tablet Take 1 tablet (0.5 mg total) by mouth 2 (two) times daily as needed for anxiety or sleep. Patient taking differently: Take 0.5 mg by mouth 3 (three) times daily as needed for anxiety or sleep.  12/26/14   Tanna Furry, MD  aspirin EC 81 MG tablet Take 1 tablet (81 mg total) by mouth daily. START ON 07/18/15. 07/10/15   Rexene Alberts, MD  cetirizine (ZYRTEC) 10 MG tablet Take 10 mg by mouth daily.    Historical Provider, MD  donepezil (ARICEPT) 10 MG tablet Take 10 mg by mouth at bedtime.    Historical Provider, MD  furosemide (LASIX) 20 MG tablet Take 1 tablet (20 mg total) by mouth 2 (two) times daily. For treatment of heart failure. 07/10/15   Rexene Alberts, MD  HYDROcodone-acetaminophen (NORCO/VICODIN) 5-325 MG per tablet Take 1 tablet by mouth 2 (two) times daily.    Historical Provider, MD  Insulin Glargine (LANTUS SOLOSTAR) 100 UNIT/ML Solostar Pen Inject 15 Units into the skin every morning.     Historical Provider, MD  levofloxacin (LEVAQUIN) 750 MG tablet Take 1 tablet (750 mg total) by mouth every other day. Antibiotic. Take EVERY OTHER DAY UNTIL COMPLETED, STARTING ON MONDAY 07/12/15. 07/10/15   Rexene Alberts, MD  metoprolol (LOPRESSOR) 50 MG tablet Take 1 tablet (50 mg total) by mouth 2 (two) times daily. 06/01/14   Nita Sells, MD  mirabegron ER (MYRBETRIQ) 25 MG TB24 tablet Take 25 mg by mouth daily.    Historical Provider, MD  pantoprazole (PROTONIX) 40 MG tablet Take 1 tablet (40 mg total) by mouth daily. 07/10/15   Rexene Alberts, MD  potassium chloride (K-DUR) 10 MEQ tablet Take 1 tablet (10 mEq total) by mouth 2 (two) times daily. Potassium. Take each  tablet with furosemide twice daily. 07/10/15   Rexene Alberts, MD  pravastatin (PRAVACHOL) 10 MG tablet Take 10 mg by mouth daily.     Historical Provider, MD  sitaGLIPtin (JANUVIA) 50 MG tablet Take 1 tablet (50 mg total) by mouth every morning. Do not take if your blood sugar is less than 120. 07/10/15   Rexene Alberts, MD  traMADol (ULTRAM) 50 MG tablet Take 1 tablet (50 mg total) by mouth every 6 (six) hours as needed. 07/12/15   Julianne Rice, MD    Family History Family History  Problem Relation Age of Onset  . Pneumonia Mother   . CAD Sister     Died with MI age 41  . Arthritis Other   . Coronary artery disease Other   . Diabetes Other   . Cancer Other   .  Colon cancer Neg Hx     Social History Social History  Substance Use Topics  . Smoking status: Never Smoker  . Smokeless tobacco: Never Used  . Alcohol use No     Allergies   Cephalexin   Review of Systems Review of Systems  Unable to perform ROS: Acuity of condition     Physical Exam Updated Vital Signs BP 186/89   Pulse 82   Temp 97.6 F (36.4 C) (Oral)   Resp 18   SpO2 91%   Physical Exam 0740: Physical examination:  Nursing notes reviewed; Vital signs and O2 SAT reviewed;  Constitutional: Well developed, Well nourished, Well hydrated, In no acute distress; Head:  Normocephalic, atraumatic; Eyes: EOMI, PERRL, No scleral icterus; ENMT: Mouth and pharynx normal, Mucous membranes moist; Neck: Supple, Full range of motion, No lymphadenopathy; Cardiovascular: Irregular rate and rhythm, No gallop; Respiratory: Breath sounds clear & equal bilaterally, No wheezes. Normal respiratory effort/excursion; Chest: Nontender, Movement normal; Abdomen: Soft, Nontender, Nondistended, Normal bowel sounds; Genitourinary: No CVA tenderness; Extremities: Pulses normal, No tenderness, No edema, No calf edema or asymmetry.; Neuro: Awake, alert. +left facial droop, speech slurred. LUE flaccid. Strength 5/5 LLE, RLE, RUE and pt will  move these extremities spontaneously and to command.; Skin: Color normal, Warm, Dry.   ED Treatments / Results  Labs (all labs ordered are listed, but only abnormal results are displayed)   EKG  EKG Interpretation  Date/Time:  Friday August 04 2016 07:52:57 EDT Ventricular Rate:  97 PR Interval:    QRS Duration: 154 QT Interval:  404 QTC Calculation: 514 R Axis:   -39 Text Interpretation:  Atrial fibrillation Left bundle branch block Left axis deviation When compared with ECG of 05/14/2015 No significant change was found Confirmed by [facility]  MD, Nunzio Cory [phone]) on 08/04/2016 8:07:23 AM       Radiology   Procedures Procedures (including critical care time)  Medications Ordered in ED Medications - No data to display   Initial Impression / Assessment and Plan / ED Course  I have reviewed the triage vital signs and the nursing notes.  Pertinent labs & imaging results that were available during my care of the patient were reviewed by me and considered in my medical decision making (see chart for details).  MDM Reviewed: previous chart, nursing note and vitals Reviewed previous: labs and ECG Interpretation: labs, ECG and CT scan Total time providing critical care: 30-74 minutes. This excludes time spent performing separately reportable procedures and services. Consults: neurology and admitting MD   CRITICAL CARE Performed by: Alfonzo Feller Total critical care time: 35 minutes Critical care time was exclusive of separately billable procedures and treating other patients. Critical care was necessary to treat or prevent imminent or life-threatening deterioration. Critical care was time spent personally by me on the following activities: development of treatment plan with patient and/or surrogate as well as nursing, discussions with consultants, evaluation of patient's response to treatment, examination of patient, obtaining history from patient or surrogate, ordering  and performing treatments and interventions, ordering and review of laboratory studies, ordering and review of radiographic studies, pulse oximetry and re-evaluation of patient's condition.  Results for orders placed or performed during the hospital encounter of 08/04/16  Protime-INR  Result Value Ref Range   Prothrombin Time 13.2 11.4 - 15.2 seconds   INR 1.00   APTT  Result Value Ref Range   aPTT 32 24 - 36 seconds  CBC  Result Value Ref Range   WBC 9.5  4.0 - 10.5 K/uL   RBC 4.14 3.87 - 5.11 MIL/uL   Hemoglobin 11.9 (L) 12.0 - 15.0 g/dL   HCT 36.8 36.0 - 46.0 %   MCV 88.9 78.0 - 100.0 fL   MCH 28.7 26.0 - 34.0 pg   MCHC 32.3 30.0 - 36.0 g/dL   RDW 14.6 11.5 - 15.5 %   Platelets 252 150 - 400 K/uL  Differential  Result Value Ref Range   Neutrophils Relative % 74 %   Neutro Abs 7.0 1.7 - 7.7 K/uL   Lymphocytes Relative 17 %   Lymphs Abs 1.6 0.7 - 4.0 K/uL   Monocytes Relative 6 %   Monocytes Absolute 0.5 0.1 - 1.0 K/uL   Eosinophils Relative 3 %   Eosinophils Absolute 0.3 0.0 - 0.7 K/uL   Basophils Relative 0 %   Basophils Absolute 0.0 0.0 - 0.1 K/uL  Comprehensive metabolic panel  Result Value Ref Range   Sodium 137 135 - 145 mmol/L   Potassium 4.2 3.5 - 5.1 mmol/L   Chloride 98 (L) 101 - 111 mmol/L   CO2 28 22 - 32 mmol/L   Glucose, Bld 274 (H) 65 - 99 mg/dL   BUN 25 (H) 6 - 20 mg/dL   Creatinine, Ser 1.01 (H) 0.44 - 1.00 mg/dL   Calcium 8.8 (L) 8.9 - 10.3 mg/dL   Total Protein 7.5 6.5 - 8.1 g/dL   Albumin 4.2 3.5 - 5.0 g/dL   AST 20 15 - 41 U/L   ALT 16 14 - 54 U/L   Alkaline Phosphatase 108 38 - 126 U/L   Total Bilirubin 1.0 0.3 - 1.2 mg/dL   GFR calc non Af Amer 47 (L) >60 mL/min   GFR calc Af Amer 54 (L) >60 mL/min   Anion gap 11 5 - 15  I-stat troponin, ED  Result Value Ref Range   Troponin i, poc 0.02 0.00 - 0.08 ng/mL   Comment 3          I-Stat Chem 8, ED  Result Value Ref Range   Sodium 138 135 - 145 mmol/L   Potassium 4.2 3.5 - 5.1 mmol/L    Chloride 96 (L) 101 - 111 mmol/L   BUN 24 (H) 6 - 20 mg/dL   Creatinine, Ser 1.10 (H) 0.44 - 1.00 mg/dL   Glucose, Bld 270 (H) 65 - 99 mg/dL   Calcium, Ion 1.08 (L) 1.15 - 1.40 mmol/L   TCO2 28 0 - 100 mmol/L   Hemoglobin 13.3 12.0 - 15.0 g/dL   HCT 39.0 36.0 - 46.0 %   Ct Angio Head W Or Wo Contrast Result Date: 08/04/2016 CLINICAL DATA:  Altered mental status.  Difficulty swallowing. EXAM: CT ANGIOGRAPHY HEAD TECHNIQUE: Multidetector CT imaging of the head was performed using the standard protocol during bolus administration of intravenous contrast. Multiplanar CT image reconstructions and MIPs were obtained to evaluate the vascular anatomy. CONTRAST:  60 cc Isovue 370 intravenous COMPARISON:  Head CT from earlier today FINDINGS: CTA HEAD Anterior circulation: Symmetric carotid arteries and branching. Incomplete circle-of-Willis at the left posterior communicating artery. Diffuse atherosclerotic calcification on the carotid siphons with calcific plaque blooming partially obscuring the luminal contrast. No proximal occlusion. No reversible flow limiting stenosis. There is a segment of weak flow within a right insular branch, M2-3 level. Negative for aneurysm. Posterior circulation: Left dominant vertebral artery. Standard vertebrobasilar branching. Focal high-grade left P1/2 segment stenosis. No major branch occlusion. The vertebral and basilar arteries are smooth and widely  patent. Atherosclerotic calcified plaque on the proximal V4 segments. Venous sinuses: Patent where opacified. Anatomic variants: No notable Delayed phase:Enhancing 18 mm meningioma without mass effect located along the left petrous temporal bone. Small focus of internal calcification. No concerning growth when compared to priors. Remote ischemic injury and atrophy as described on preceding noncontrast head CT. Right insular indistinctness suspected on the previous scan is less convincing on this scan which is less affected by motion.  IMPRESSION: 1. No emergent large vessel occlusion. 2. High-grade right M2-3 segment stenosis which could be from atherosclerosis or thrombus. 3. Notable atheromatous proximal left PCA stenosis. 4. 18 mm meningioma in the left posterior fossa without mass effect Electronically Signed   By: Monte Fantasia M.D.   On: 08/04/2016 10:05   Ct Head Code Stroke W/o Cm Result Date: 08/04/2016 CLINICAL DATA:  Code stroke. Altered mental status. Last seen normal 5 a.m. today. EXAM: CT HEAD WITHOUT CONTRAST TECHNIQUE: Contiguous axial images were obtained from the base of the skull through the vertex without intravenous contrast. COMPARISON:  07/12/2015 FINDINGS: Motion degraded exam. Indistinct posterior right insula. No hemorrhage, swelling, hydrocephalus, or shift. Deep gray nuclei are stable from prior, chronically indistinct. Since previous there have been biparietal cortically based infarcts, extending into the occipital lobe on the left. Atrophy with prominent mesial temporal and parietal involvement. This pattern can be seen with Alzheimer's disease. Meningioma along the posterior left petrous temporal bone measuring 15 mm on previous study, stable as permitted by artifact. ASPECTS ([place] Stroke Program Early CT Score) - Ganglionic level infarction (caudate, lentiform nuclei, internal capsule, insula, M1-M3 cortex): 6 - Supraganglionic infarction (M4-M6 cortex): 3 Total score (0-10 with 10 being normal): 9 These results were called by telephone at the time of interpretation on 08/04/2016 at 8:03 am to Dr. Francine Graven , who verbally acknowledged these results. IMPRESSION: 1. Motion degraded exam. 2. Negative for hemorrhage. Indistinct right insula which could be motion artifact or ischemic. ASPECTS is 9 at worst. 3. Chronic biparietal infarct which has occurred since 2016. 4. Brain atrophy as described above. 5. Stable approximately 15 mm meningioma along the left petrous bone. Electronically Signed   By:  Monte Fantasia M.D.   On: 08/04/2016 08:09     0815:  T/C from [facility] Neuro: states she talked with NH staff and pt was seen at 0600 "mumbling" and "c/o choking when being given her meds," but "was able to swallow her meds and water without choking;" concerned regarding different HPI's, [facility] Neuro requests stat CT-A head, consult IR for intervention if clot seen.   0825:  T/C from [facility]: she has spoken with the pt again, pt states she felt she was choking when taking her meds/water at 0600 which is new for her, this would have pt waking up with symptoms and place pt's LKW last night; not TPA candidate, continue to obtain CT-A head and consult IR.   1025:  T/C to [facility] Dr. Leonel Ramsay, case discussed, including:  HPI, pertinent PM/SHx, VS/PE, dx testing, ED course and treatment:  Agrees no acute IR intervention for CT-A findings above, OK to stay at [facility] for stroke workup, transfer prn. MRI brain ordered.    1055:  T/C to [facility] Dr. Coralyn Pear, case discussed, including:  HPI, pertinent PM/SHx, VS/PE, dx testing, ED course and treatment:  Agreeable to admit, requests to write temporary orders, obtain tele bed to team APAdmits.        Final Clinical Impressions(s) / ED Diagnoses  Final diagnoses:  None    New Prescriptions New Prescriptions   No medications on file     Francine Graven, DO 08/07/16 [id number]

## 2016-08-04 NOTE — ED Notes (Signed)
Pt to MRI

## 2016-08-04 NOTE — ED Notes (Signed)
Tele-neurologist on line, talking to nursing home to confirm accurate story with time line. Per [facility] staff patient was c/o difficulty swallowing and was noted to be mumbling which is not patient's normal. No left side weakness noted at that time and patient took oral medication without difficulty. Patient states that she did have a "bad" headache and felt like she had difficulty swallowing.

## 2016-08-05 ENCOUNTER — Inpatient Hospital Stay (HOSPITAL_COMMUNITY): Payer: Medicare Other

## 2016-08-05 DIAGNOSIS — I6789 Other cerebrovascular disease: Secondary | ICD-10-CM

## 2016-08-05 LAB — ECHOCARDIOGRAM COMPLETE
EERAT: 29.04
EWDT: 148 ms
FS: 13 % — AB (ref 28–44)
HEIGHTINCHES: 65 in
IV/PV OW: 1.13
LA vol A4C: 52.2 ml
LADIAMINDEX: 2.69 cm/m2
LASIZE: 43 mm
LEFT ATRIUM END SYS DIAM: 43 mm
LV E/e' medial: 29.04
LV PW d: 11.2 mm — AB (ref 0.6–1.1)
LV e' LATERAL: 5.44 cm/s
LVEEAVG: 29.04
LVOT area: 2.84 cm2
LVOT diameter: 19 mm
MRPISAEROA: 0.25 cm2
MV Dec: 148
MV Peak grad: 10 mmHg
MV pk A vel: 67 m/s
MVPKEVEL: 158 m/s
P 1/2 time: 961 ms
RV sys press: 56 mmHg
Reg peak vel: 364 cm/s
TDI e' lateral: 5.44
TDI e' medial: 5.55
TRMAXVEL: 364 cm/s
VTI: 154 cm
Weight: 1932.8 oz

## 2016-08-05 LAB — GLUCOSE, CAPILLARY
GLUCOSE-CAPILLARY: 173 mg/dL — AB (ref 65–99)
GLUCOSE-CAPILLARY: 202 mg/dL — AB (ref 65–99)
Glucose-Capillary: 140 mg/dL — ABNORMAL HIGH (ref 65–99)
Glucose-Capillary: 162 mg/dL — ABNORMAL HIGH (ref 65–99)
Glucose-Capillary: 224 mg/dL — ABNORMAL HIGH (ref 65–99)

## 2016-08-05 LAB — BASIC METABOLIC PANEL
Anion gap: 6 (ref 5–15)
BUN: 18 mg/dL (ref 6–20)
CO2: 26 mmol/L (ref 22–32)
CREATININE: 0.87 mg/dL (ref 0.44–1.00)
Calcium: 8 mg/dL — ABNORMAL LOW (ref 8.9–10.3)
Chloride: 102 mmol/L (ref 101–111)
GFR calc Af Amer: >60 mL/min (ref >60)
GFR, EST NON AFRICAN AMERICAN: 56 mL/min — AB (ref >60)
Glucose, Bld: 165 mg/dL — ABNORMAL HIGH (ref 65–99)
Potassium: 3.6 mmol/L (ref 3.5–5.1)
SODIUM: 134 mmol/L — AB (ref 135–145)

## 2016-08-05 LAB — LIPID PANEL
CHOL/HDL RATIO: 5.2 ratio
Cholesterol: 161 mg/dL (ref 0–200)
HDL: 31 mg/dL — AB (ref >40)
LDL CALC: 112 mg/dL — AB (ref 0–99)
Triglycerides: 91 mg/dL (ref <150)
VLDL: 18 mg/dL (ref 0–40)

## 2016-08-05 LAB — CBC
HCT: 38 % (ref 36.0–46.0)
Hemoglobin: 12.2 g/dL (ref 12.0–15.0)
MCH: 28.8 pg (ref 26.0–34.0)
MCHC: 32.1 g/dL (ref 30.0–36.0)
MCV: 89.6 fL (ref 78.0–100.0)
PLATELETS: 163 10*3/uL (ref 150–400)
RBC: 4.24 MIL/uL (ref 3.87–5.11)
RDW: 14.8 % (ref 11.5–15.5)
WBC: 8.2 10*3/uL (ref 4.0–10.5)

## 2016-08-05 MED ORDER — RESOURCE THICKENUP CLEAR PO POWD
ORAL | Status: DC | PRN
  Filled 2016-08-05: qty 125

## 2016-08-05 NOTE — Evaluation (Signed)
Clinical/Bedside Swallow Evaluation Patient Details  Name: Alicia Mason MRN: [id number] Date of Birth: Mar 31, 1922  Today's Date: 08/05/2016 Time: SLP Start Time (ACUTE ONLY): 1418 SLP Stop Time (ACUTE ONLY): 1500 SLP Time Calculation (min) (ACUTE ONLY): 42 min  Past Medical History:  Past Medical History:  Diagnosis Date  . Acute ischemic stroke ([place]) 08/04/2013   Right MCA stroke  . Acute on chronic combined systolic and diastolic heart failure ([place]) 10/01/2014   EF [id number]; grade 2 diastolic dysfunction  . Arthritis   . Atrial fibrillation ([place]) 05/28/2009   Paroxysmal    . Cerebrovascular disease   . Chronic anxiety   . Closed fracture of right humerus    Managed conservatively 2011  . Colon polyps 07/08/2015  . Coronary atherosclerosis of native coronary artery 1999   CABG LIMA to LAD, SVG to OM1 and OM2 and SVG to PDA in 1999,   . Dementia   . Diabetes mellitus   . Dieulafoy lesion of colon 07/08/2015  . Dyslipidemia   . Essential hypertension, benign   . Ischemic cardiomyopathy    45-50% 07/2013/ EF 40% per Echo 06/2011.  Marland Kitchen Near syncope   . NSTEMI (non-ST elevated myocardial infarction) ([place])    10/02/2014 & 06/2011.  Marland Kitchen Pyloric stenosis 07/08/2015  . Seasonal allergies   . Type 2 diabetes mellitus ([place])   . Vertigo    Past Surgical History:  Past Surgical History:  Procedure Laterality Date  . ABDOMINAL HYSTERECTOMY     WITH BSO.  . Cataract surgery    . COLONOSCOPY N/A 07/07/2015   Procedure: COLONOSCOPY;  Surgeon: Danie Binder, MD;  Location: [facility];  Service: Endoscopy;  Laterality: N/A;  . CORONARY ARTERY BYPASS GRAFT  1999   LIMA to LAD, SVG to OM1 and OM2, SVG to PDA  . ESOPHAGOGASTRODUODENOSCOPY N/A 07/07/2015   Procedure: ESOPHAGOGASTRODUODENOSCOPY (EGD);  Surgeon: Danie Binder, MD;  Location: [facility];  Service: Endoscopy;  Laterality: N/A;  . ORIF HIP FRACTURE Left 05/30/2014   Procedure: COMPRESSION HIP SCREW LEFT HIP;  Surgeon: Sanjuana Kava, MD;  Location: [facility];  Service: Orthopedics;  Laterality: Left;   HPI:  Alicia Mason is a 82 y/o F with multiple comorbidities. The morning of 9/15 she was found to have left-sided weakness associated with slurred speech by nursing home staff, last seen normal at 5 AM with medicine administration. Nursing home staff reporting that she had difficulties swallowing and had been slurring her words which was not baseline for her. History is limited from her, I obtained the above history from emergency room staff, nursing home staff, family members. MRI revealed acute Moderate-sized right MCA infarct due to chronic untreated atrial fibrillation. Overall prognosis for functional recovery poor given baseline comorbidities including advanced dementia and age. Pt failed stroke swallow and SLP will complete clinical swallow evaluation and treat as indicated.   Assessment / Plan / Recommendation Clinical Impression  Pt presents with moderate oropharyngeal dysphagia secondary to acute Right MCA infarct negatively impacted by advanced dementia. Oral mechanism exam reveals left facial droop, decreased left labial and lingual strength and ROM; SLP also noted hard, light colored growth on hard palate. Pt demonstrated immediate overt coughing followed by residual wet vocal quality requiring multiple additional dry swallows and cues to throat clears and coughs to clear.  Pt demonstrated oral holding (~8 seconds prior to triggering a swallow despite verbal cues) and consistent wet vocal quality and delayed throat clearing with NTL. Pt demonstrated  continued oral holding with HTL but significantly decreased wet vocal quality. Pt was unable to thoroughly masticate solids with left sulci pocketing noted and oral residuals with mild wet vocal quality. Pt tolerated puree textures with uncoordinated oral stage transfer, mild oral residue and left sulcus pocketing. Recommend D1/puree with meds to be crushed in puree; pt  benefits from verbal cues to "go ahead and swallow", and "swallow hard". D/t pt's advanced dementia she requires full supervision and support for all meals, check oral cavity frequently during meal to ensure clearance of left sulci pocketing. Pt can follow commands; SLP will follow while in acute setting for possible diet upgrade and assessment of diet tolerance.    Aspiration Risk  Moderate aspiration risk    Diet Recommendation Dysphagia 1 (Puree);Honey-thick liquid   Liquid Administration via: Spoon;Cup Medication Administration: Crushed with puree Supervision: Full supervision/cueing for compensatory strategies Compensations: Minimize environmental distractions;Small sips/bites;Multiple dry swallows after each bite/sip;Follow solids with liquid;Clear throat intermittently;Effortful swallow Postural Changes: Seated upright at 90 degrees    Other  Recommendations Oral Care Recommendations: Oral care BID Other Recommendations: Order thickener from pharmacy   Follow up Recommendations Skilled Nursing facility      Frequency and Duration min 2x/week  2 weeks       Prognosis Prognosis for Safe Diet Advancement: Fair Barriers to Reach Goals: Cognitive deficits      Swallow Study   General Date of Onset: 08/04/16 HPI: Alicia Mason is a 82 y/o F with multiple comorbidities. The morning of 9/15 she was found to have left-sided weakness associated with slurred speech by nursing home staff, last seen normal at 5 AM with medicine administration. Nursing home staff reporting that she had difficulties swallowing and had been slurring her words which was not baseline for her. History is limited from her, I obtained the above history from emergency room staff, nursing home staff, family members. MRI revealed acute Moderate-sized right MCA infarct due to chronic untreated atrial fibrillation. Overall prognosis for functional recovery poor given baseline comorbidities including advanced dementia  and age. Pt failed stroke swallow and SLP will complete clinical swallow evaluation and treat as indicated. Type of Study: Bedside Swallow Evaluation Previous Swallow Assessment: BSE 2014 revealing swallowing WFL Diet Prior to this Study: NPO Temperature Spikes Noted: No Respiratory Status: Nasal cannula History of Recent Intubation: No Behavior/Cognition: Cooperative;Pleasant mood;Lethargic/Drowsy;Requires cueing Oral Cavity Assessment: Dry Oral Care Completed by SLP: Yes Oral Cavity - Dentition: Missing dentition Vision: Functional for self-feeding Self-Feeding Abilities: Total assist Patient Positioning: Upright in bed Baseline Vocal Quality: Normal Volitional Cough: Weak Volitional Swallow: Able to elicit    Oral/Motor/Sensory Function Overall Oral Motor/Sensory Function: Moderate impairment Facial ROM: Reduced left Facial Symmetry: Abnormal symmetry left Facial Strength: Reduced left Facial Sensation: Reduced left Lingual ROM: Reduced left Lingual Symmetry: Abnormal symmetry left Lingual Strength: Reduced Lingual Sensation: Reduced   Ice Chips Ice chips: Impaired Presentation: Spoon Oral Phase Impairments: Reduced labial seal;Reduced lingual movement/coordination;Poor awareness of bolus Oral Phase Functional Implications: Left anterior spillage;Prolonged oral transit;Oral holding Pharyngeal Phase Impairments: Suspected delayed Swallow;Decreased hyoid-laryngeal movement;Wet Vocal Quality;Throat Clearing - Delayed   Thin Liquid Thin Liquid: Impaired Presentation: Cup;Spoon Oral Phase Impairments: Reduced labial seal;Poor awareness of bolus Oral Phase Functional Implications: Left anterior spillage;Prolonged oral transit;Oral holding Pharyngeal  Phase Impairments: Suspected delayed Swallow;Wet Vocal Quality;Cough - Immediate    Nectar Thick Nectar Thick Liquid: Impaired Presentation: Cup Oral Phase Impairments: Reduced labial seal Oral phase functional implications:  Prolonged oral transit;Oral holding Pharyngeal Phase  Impairments: Suspected delayed Swallow;Wet Vocal Quality;Throat Clearing - Delayed   Honey Thick Honey Thick Liquid: Within functional limits   Puree Puree: Impaired Presentation: Spoon Oral Phase Impairments: Reduced labial seal;Reduced lingual movement/coordination;Poor awareness of bolus Oral Phase Functional Implications: Left lateral sulci pocketing;Prolonged oral transit;Oral residue;Oral holding Pharyngeal Phase Impairments: Suspected delayed Swallow;Wet Vocal Quality;Throat Clearing - Delayed   Solid        Nyheim Seufert H. Roddie Mc, CCC-SLP Speech Language Pathologist  Solid: Impaired Presentation: Spoon Oral Phase Impairments: Reduced lingual movement/coordination;Poor awareness of bolus;Impaired mastication Oral Phase Functional Implications: Left lateral sulci pocketing;Prolonged oral transit;Impaired mastication;Oral residue Pharyngeal Phase Impairments: Suspected delayed Swallow;Wet Vocal Quality        Wende Bushy 08/05/2016,3:21 PM

## 2016-08-05 NOTE — Progress Notes (Signed)
PROGRESS NOTE    Alicia Mason  [id number] DOB: 13-Dec-1921 DOA: 08/04/2016 PCP: Glo Herring., MD   Brief Narrative:  Alicia Mason is a 82 year old female having multiple comorbidities including a history of atrial fibrillation, chronic systolic heart failure, history of severe GI bleed for which anticoagulation was discontinued, admitted to the medicine service on 08/04/2016 when she presented with complaints of left-sided weakness and slurred speech. She [facility] a [facility]. On initial evaluation she had flaccid paralysis of [facility] left extremity along with left-sided facial droop and slurred speech. Initially she failed [facility] swallow screen. She was placed on the stroke protocol. She was not felt to be candidate for thrombolytic therapy. MRI of brain revealed an acute right MCA infarct involving posterior frontal lobe and insula. She was seen and evaluated by neurology. Treated with aspirin.   Assessment & Plan:   Active Problems:   Atrial fibrillation (HCC)   DM type 2 (diabetes mellitus, type 2) ([place])   Acute encephalopathy   Essential hypertension   Acute CVA (cerebrovascular accident) ([place])   Chronic systolic (congestive) heart failure ([place])  1.  Acute CVA -[name] is a pleasant 35-year-old female having a history of atrial fibrillation, previously had been on anticoagulants was relatively low, stopped about a year ago after she presented with a severe GI bleed -She presented with left-sided weakness and slurred speech -MRI of brain showing acute right MCA infarct involving posterior frontal lobe and insula -Per nursing staff she failed swallow screen -Neurology consulted, felt to have poor prognosis, and recommending palliative care consult. Will discuss further with family.  2.  History of atrial fibrillation -CHADSVasc score of at least 5 -Have been anticoagulant with the results of until year ago when this was discontinued when she presented  with severe GI bleed and hemoglobin 5.7. -Unfortunately she now presents with CVA -[facility] menstruation controlled for the most part with heart rates fluctuated between 71 and 100  3.  Chronic systolic congestive heart failure. A transthoracic echocardiogram from 2015 showed an EF of 40-45% with severe hypokinesis. She is currently receiving IV fluids since she is nothing by mouth. Monitor volume status closely.  4.  History of hypertension. Antihypertensive agents held to favor cerebral perfusion in the setting of stroke  5.  History of type 2 diabetes mellitus. She remains nothing by mouth, was hypoglycemic overnight for which she was administered dextrose. Hypoglycemic agents held.   DVT prophylaxis: Lovenox Code Status: DO NOT RESUSCITATE Family Communication: I spoke to [facility] and [facility] Disposition Plan:   Consultants:   Neurology  Subjective: She has made little improvement since yesterday regarding focal neurological deficits  Objective: Vitals:   08/05/16 0613 08/05/16 0813 08/05/16 1018 08/05/16 1218  BP: (!) 150/77 (!) 151/74 (!) 142/82 124/70  Pulse: 85 94 71 (!) 101  Resp: 14 14 14 15   Temp: 97.9 F (36.6 C) 97.9 F (36.6 C) 98 F (36.7 C) 98.1 F (36.7 C)  TempSrc: Oral Oral Oral Oral  SpO2: 100% 100% 100% 100%  Weight:      Height:        Intake/Output Summary (Last 24 hours) at 08/05/16 1314 Last data filed at 08/05/16 1012  Gross per 24 hour  Intake           528.25 ml  Output                2 ml  Net  526.25 ml   Filed Weights   08/04/16 0822 08/04/16 0829 08/04/16 1630  Weight: 58.5 kg (128 lb 14.4 oz) 58.5 kg (128 lb 14.4 oz) 54.8 kg (120 lb 12.8 oz)    Examination:  General exam: Appears calm and comfortable  Respiratory system: Clear to auscultation. Respiratory effort normal. Cardiovascular system: Irregular rate and rhythm. No JVD, murmurs, rubs, gallops or clicks. No pedal edema. Gastrointestinal system: Abdomen is  nondistended, soft and nontender. No organomegaly or masses felt. Normal bowel sounds heard. Central nervous system: She continues to have left upper extremity paralysis, 3-4 out of 5 muscle strength to [facility] left lower extremity, left-sided neglect, facial droop at slurred speech Extremities: Symmetric 5 x 5 power. Skin: No rashes, lesions or ulcers Psychiatry: She is confused and disoriented    Data Reviewed: I have personally reviewed following labs and imaging studies  CBC:  Recent Labs Lab 08/04/16 0738 08/04/16 0746 08/05/16 0500  WBC 9.5  --  8.2  NEUTROABS 7.0  --   --   HGB 11.9* 13.3 12.2  HCT 36.8 39.0 38.0  MCV 88.9  --  89.6  PLT 252  --  [id number]   Basic Metabolic Panel:  Recent Labs Lab 08/04/16 0738 08/04/16 0746 08/05/16 0500  NA 137 138 134*  K 4.2 4.2 3.6  CL 98* 96* 102  CO2 28  --  26  GLUCOSE 274* 270* 165*  BUN 25* 24* 18  CREATININE 1.01* 1.10* 0.87  CALCIUM 8.8*  --  8.0*   GFR: Estimated Creatinine Clearance: 35.7 mL/min (by C-G formula based on SCr of 0.87 mg/dL). Liver Function Tests:  Recent Labs Lab 08/04/16 0738  AST 20  ALT 16  ALKPHOS 108  BILITOT 1.0  PROT 7.5  ALBUMIN 4.2   No results for input(s): LIPASE, AMYLASE in the last 168 hours. No results for input(s): AMMONIA in the last 168 hours. Coagulation Profile:  Recent Labs Lab 08/04/16 0738  INR 1.00   Cardiac Enzymes: No results for input(s): CKTOTAL, CKMB, CKMBINDEX, TROPONINI in the last 168 hours. BNP (last 3 results) No results for input(s): PROBNP in the last 8760 hours. HbA1C: No results for input(s): HGBA1C in the last 72 hours. CBG:  Recent Labs Lab 08/04/16 2334 08/04/16 2349 08/05/16 0415 08/05/16 0740 08/05/16 1110  GLUCAP 49* 145* 140* 162* 173*   Lipid Profile:  Recent Labs  08/05/16 0500  CHOL 161  HDL 31*  LDLCALC 112*  TRIG 91  CHOLHDL 5.2   Thyroid Function Tests: No results for input(s): TSH, T4TOTAL, FREET4, T3FREE, THYROIDAB  in the last 72 hours. Anemia Panel: No results for input(s): VITAMINB12, FOLATE, FERRITIN, TIBC, IRON, RETICCTPCT in the last 72 hours. Sepsis Labs: No results for input(s): PROCALCITON, LATICACIDVEN in the last 168 hours.  Recent Results (from the past 240 hour(s))  MRSA PCR Screening     Status: None   Collection Time: 08/04/16  6:14 PM  Result Value Ref Range Status   MRSA by PCR NEGATIVE NEGATIVE Final    Comment:        The GeneXpert MRSA Assay (FDA approved for NASAL specimens only), is one component of a comprehensive MRSA colonization surveillance program. It is not intended to diagnose MRSA infection nor to guide or monitor treatment for MRSA infections.          Radiology Studies: Ct Angio Head W Or Wo Contrast  Result Date: 08/04/2016 CLINICAL DATA:  Altered mental status.  Difficulty swallowing. EXAM: CT ANGIOGRAPHY HEAD  TECHNIQUE: Multidetector CT imaging of the head was performed using the standard protocol during bolus administration of intravenous contrast. Multiplanar CT image reconstructions and MIPs were obtained to evaluate the vascular anatomy. CONTRAST:  60 cc Isovue 370 intravenous COMPARISON:  Head CT from earlier today FINDINGS: CTA HEAD Anterior circulation: Symmetric carotid arteries and branching. Incomplete circle-of-Willis at the left posterior communicating artery. Diffuse atherosclerotic calcification on the carotid siphons with calcific plaque blooming partially obscuring the luminal contrast. No proximal occlusion. No reversible flow limiting stenosis. There is a segment of weak flow within a right insular branch, M2-3 level. Negative for aneurysm. Posterior circulation: Left dominant vertebral artery. Standard vertebrobasilar branching. Focal high-grade left P1/2 segment stenosis. No major branch occlusion. The vertebral and basilar arteries are smooth and widely patent. Atherosclerotic calcified plaque on the proximal V4 segments. Venous sinuses:  Patent where opacified. Anatomic variants: No notable Delayed phase:Enhancing 18 mm meningioma without mass effect located along the left petrous temporal bone. Small focus of internal calcification. No concerning growth when compared to priors. Remote ischemic injury and atrophy as described on preceding noncontrast head CT. Right insular indistinctness suspected on the previous scan is less convincing on this scan which is less affected by motion. IMPRESSION: 1. No emergent large vessel occlusion. 2. High-grade right M2-3 segment stenosis which could be from atherosclerosis or thrombus. 3. Notable atheromatous proximal left PCA stenosis. 4. 18 mm meningioma in the left posterior fossa without mass effect Electronically Signed   By: Monte Fantasia M.D.   On: 08/04/2016 10:05   Mr Jodene Nam Head Wo Contrast  Result Date: 08/04/2016 CLINICAL DATA:  Left-sided weakness and slurred speech. EXAM: MRI HEAD WITHOUT CONTRAST MRA HEAD WITHOUT CONTRAST TECHNIQUE: Multiplanar, multiecho pulse sequences of the brain and surrounding structures were obtained without intravenous contrast. Angiographic images of the head were obtained using MRA technique without contrast. COMPARISON:  Head CT and CTA 08/04/2016.  Brain MRI 08/04/2013. FINDINGS: MRI HEAD FINDINGS The study is moderately motion degraded. There is a small acute right MCA infarct involving the posterior right frontal lobe, predominantly the operculum, as well as a portion of the right insula. No gross intracranial hemorrhage, intra-axial mass, midline shift, or extra-axial fluid collection is seen. There is moderate cerebral atrophy. Chronic bilateral parietal cortical infarcts are new from the prior MRI. T2 hyperintensities elsewhere in the deep cerebral white matter bilaterally are similar to the prior MRI and compatible with chronic small vessel ischemia. Extra-axial mass in the posterior fossa along the petrous temporal bone measures 19 x 12 mm, slightly smaller  than on the prior MRI. Prior bilateral cataract extraction is noted. Mild mucosal thickening and small to moderate volume fluid in the right maxillary sinus. Clear mastoid air cells. Major intracranial vascular flow voids are grossly preserved. 15 mm T2 hyperintense is stable to minimally larger than on the prior MRI, favored to be benign. MRA HEAD FINDINGS The study is moderately to severely motion degraded. The V4 segments of the vertebral arteries are not well evaluated due to motion artifact. Flow is grossly identified in the dominant left vertebral artery. The basilar artery is patent without evidence of significant stenosis. There is a patent right posterior communicating artery with hypoplastic right P1 segment. The left P1 segment is patent with a high-grade stenosis again seen near the P1-P2 junction. Artifact precludes assessment of the P2 in more distal PCAs. The internal carotid arteries are patent from skullbase to carotid termini. Focal areas of relative signal dropout in both ICAs are  felt to be artifactual due to lack of high-grade stenosis on earlier CTA. There is likely mild stenosis involving the anterior genu and supraclinoid segments bilaterally. M1 segments are patent proximally. More distal M1 segments, MCA bifurcations, and MCA branch vessels are inadequately evaluated due to motion artifact and poor signal. There is gross flow related enhancement in the A1 and A2 segments, however evaluation for underlying stenosis is severely limited by artifact. IMPRESSION: 1. Acute right MCA infarct involving the posterior frontal lobe and insula. 2. Chronic bilateral parietal infarcts. 3. 19 mm meningioma along the left petrous temporal bone, slightly smaller than in 2014. 4. Moderately to severely motion degraded head MRA as above. Intracranial circulation much better assessed on today's earlier CTA. Electronically Signed   By: Logan Bores M.D.   On: 08/04/2016 12:05   Mr Brain Wo Contrast (neuro  Protocol)  Result Date: 08/04/2016 CLINICAL DATA:  Left-sided weakness and slurred speech. EXAM: MRI HEAD WITHOUT CONTRAST MRA HEAD WITHOUT CONTRAST TECHNIQUE: Multiplanar, multiecho pulse sequences of the brain and surrounding structures were obtained without intravenous contrast. Angiographic images of the head were obtained using MRA technique without contrast. COMPARISON:  Head CT and CTA 08/04/2016.  Brain MRI 08/04/2013. FINDINGS: MRI HEAD FINDINGS The study is moderately motion degraded. There is a small acute right MCA infarct involving the posterior right frontal lobe, predominantly the operculum, as well as a portion of the right insula. No gross intracranial hemorrhage, intra-axial mass, midline shift, or extra-axial fluid collection is seen. There is moderate cerebral atrophy. Chronic bilateral parietal cortical infarcts are new from the prior MRI. T2 hyperintensities elsewhere in the deep cerebral white matter bilaterally are similar to the prior MRI and compatible with chronic small vessel ischemia. Extra-axial mass in the posterior fossa along the petrous temporal bone measures 19 x 12 mm, slightly smaller than on the prior MRI. Prior bilateral cataract extraction is noted. Mild mucosal thickening and small to moderate volume fluid in the right maxillary sinus. Clear mastoid air cells. Major intracranial vascular flow voids are grossly preserved. 15 mm T2 hyperintense is stable to minimally larger than on the prior MRI, favored to be benign. MRA HEAD FINDINGS The study is moderately to severely motion degraded. The V4 segments of the vertebral arteries are not well evaluated due to motion artifact. Flow is grossly identified in the dominant left vertebral artery. The basilar artery is patent without evidence of significant stenosis. There is a patent right posterior communicating artery with hypoplastic right P1 segment. The left P1 segment is patent with a high-grade stenosis again seen near the  P1-P2 junction. Artifact precludes assessment of the P2 in more distal PCAs. The internal carotid arteries are patent from skullbase to carotid termini. Focal areas of relative signal dropout in both ICAs are felt to be artifactual due to lack of high-grade stenosis on earlier CTA. There is likely mild stenosis involving the anterior genu and supraclinoid segments bilaterally. M1 segments are patent proximally. More distal M1 segments, MCA bifurcations, and MCA branch vessels are inadequately evaluated due to motion artifact and poor signal. There is gross flow related enhancement in the A1 and A2 segments, however evaluation for underlying stenosis is severely limited by artifact. IMPRESSION: 1. Acute right MCA infarct involving the posterior frontal lobe and insula. 2. Chronic bilateral parietal infarcts. 3. 19 mm meningioma along the left petrous temporal bone, slightly smaller than in 2014. 4. Moderately to severely motion degraded head MRA as above. Intracranial circulation much better assessed on today's  earlier CTA. Electronically Signed   By: Logan Bores M.D.   On: 08/04/2016 12:05   US Carotid Bilateral  Result Date: 08/04/2016 CLINICAL DATA:  Acute CVA 1 day ago.  Acute right MCA infarct. EXAM: BILATERAL CAROTID DUPLEX ULTRASOUND TECHNIQUE: Pearline Cables scale imaging, color Doppler and duplex ultrasound were performed of bilateral carotid and vertebral arteries in the neck. COMPARISON:  Brain MRI 08/04/2016 FINDINGS: Criteria: Quantification of carotid stenosis is based on velocity parameters that correlate the residual internal carotid diameter with NASCET-based stenosis levels, using the diameter of the distal internal carotid lumen as the denominator for stenosis measurement. The following velocity measurements were obtained: RIGHT ICA:  58 cm/sec CCA:  41 cm/sec SYSTOLIC ICA/CCA RATIO:  1.4 DIASTOLIC ICA/CCA RATIO:  2.1 ECA:  134 cm/sec LEFT ICA:  69 cm/sec CCA:  50 cm/sec SYSTOLIC ICA/CCA RATIO:  1.4  DIASTOLIC ICA/CCA RATIO:  1.8 ECA:  120 cm/sec RIGHT CAROTID ARTERY: Moderate amount of plaque at the right carotid bulb. There is irregular plaque at the origin of the external carotid artery without significant stenosis. Plaque at the origin of the internal carotid artery without significant stenosis based on the waveforms and velocities. RIGHT VERTEBRAL ARTERY: Antegrade flow and normal waveform in the right vertebral artery. LEFT CAROTID ARTERY: Calcified plaque with shadowing at the left carotid bulb. Small amount of plaque in the proximal left external carotid artery. Small amount of plaque in the proximal internal carotid artery. No significant stenosis in the left internal carotid artery based on the waveforms and velocities. LEFT VERTEBRAL ARTERY: Antegrade flow and normal waveform in the left vertebral artery. IMPRESSION: Atherosclerotic disease in the bilateral carotid arteries, most prominent at the right carotid bulb. Estimated degree of stenosis in the internal carotid arteries is less than 50% bilaterally. Patent vertebral arteries. Electronically Signed   By: Markus Daft M.D.   On: 08/04/2016 14:34   Ct Head Code Stroke W/o Cm  Result Date: 08/04/2016 CLINICAL DATA:  Code stroke. Altered mental status. Last seen normal 5 a.m. today. EXAM: CT HEAD WITHOUT CONTRAST TECHNIQUE: Contiguous axial images were obtained from the base of the skull through the vertex without intravenous contrast. COMPARISON:  07/12/2015 FINDINGS: Motion degraded exam. Indistinct posterior right insula. No hemorrhage, swelling, hydrocephalus, or shift. Deep gray nuclei are stable from prior, chronically indistinct. Since previous there have been biparietal cortically based infarcts, extending into the occipital lobe on the left. Atrophy with prominent mesial temporal and parietal involvement. This pattern can be seen with Alzheimer's disease. Meningioma along the posterior left petrous temporal bone measuring 15 mm on  previous study, stable as permitted by artifact. ASPECTS ([facility] Stroke Program Early CT Score) - Ganglionic level infarction (caudate, lentiform nuclei, internal capsule, insula, M1-M3 cortex): 6 - Supraganglionic infarction (M4-M6 cortex): 3 Total score (0-10 with 10 being normal): 9 These results were called by telephone at the time of interpretation on 08/04/2016 at 8:03 am to Dr. Francine Graven , who verbally acknowledged these results. IMPRESSION: 1. Motion degraded exam. 2. Negative for hemorrhage. Indistinct right insula which could be motion artifact or ischemic. ASPECTS is 9 at worst. 3. Chronic biparietal infarct which has occurred since 2016. 4. Brain atrophy as described above. 5. Stable approximately 15 mm meningioma along the left petrous bone. Electronically Signed   By: Monte Fantasia M.D.   On: 08/04/2016 08:09        Scheduled Meds: .  stroke: mapping our early stages of recovery book   Does not apply Once  .  aspirin  300 mg Rectal Daily   Or  . aspirin  325 mg Oral Daily  . enoxaparin (LOVENOX) injection  30 mg Subcutaneous Q24H  . insulin aspart  0-15 Units Subcutaneous Q4H   Continuous Infusions: . sodium chloride 75 mL/hr at 08/04/16 1334  . dextrose 30 mL/hr at 08/04/16 2336     LOS: 1 day    Time spent: 35 min    Kelvin Cellar, MD [facility] Pager [phone]  If 7PM-7AM, please contact night-coverage [email or web address] Password [email or web address] 08/05/2016, 1:14 PM

## 2016-08-05 NOTE — Evaluation (Signed)
Physical Therapy Evaluation Patient Details Name: Alicia Mason MRN: [id number] DOB: Sep 19, 1922 Today's Date: 08/05/2016   History of Present Illness  Alicia Mason is a 82 y.o. female with multiple comorbidities, including having history of atrial fibrillation, systolic heart failure last transthoracic echocardiogram performed in 2015 that revealed reduced ejection fraction of 40-45%, history of severe GI bleed for which Xarelto was discontinued, insulin-dependent diabetes mellitus, hypertension, presented as a transfer to the emergency department from her skilled nursing facility. This morning she was found to have left-sided weakness associated with slurred speech by nursing home staff, last seen normal at 5 AM with medicine administration. Nursing home staff reporting that she had difficulties swallowing and had been slurring her words which was not baseline for her. History is limited from her, I obtained the above history from emergency room staff, nursing home staff, family members  Clinical Impression  Patient found supine in bed, sleeping but easily woken and pleasant with A&Ox2, willing to participate in skilled PT services today. Monitored O2/HR on 2LPM O2 throughout session due to history of A-fib, however both O2 and HR remained in the mid-90s with no acute changes noted throughout session. Attempted supine to sit with HOB elevated; able to complete partial transfer with Max(A)  before patient refused to continue due to pain in her back, also leaned backwards and held onto bed rail, not allowing PT to complete full supine to sit transfer. Also attempted rolling in bed with verbal and tactile cues, however patient required Max(A) for this activity as well. Patient did, however, follow commands to assist PT with scooting up in bed although Mod-Max(A) was provided. Requested that CNA come to clean patient/change linens as they appeared soiled at end of session. Patient left supine in bed with  HOB elevated, bed alarm set, call bell in reach, and all needs otherwise met. Session today limited by cognitive impairment possibly related to dementia, however will continue to attempt skilled PT during her stay in this facility; recommend return to SNF upon DC from this facility.     Follow Up Recommendations SNF    Equipment Recommendations  None recommended by PT    Recommendations for Other Services OT consult;Speech consult     Precautions / Restrictions Precautions Precautions: Fall Restrictions Weight Bearing Restrictions: No      Mobility  Bed Mobility Overal bed mobility: Needs Assistance Bed Mobility: Rolling;Supine to Sit;Sit to Supine Rolling: Max assist   Supine to sit: Max assist Sit to supine: Max assist   General bed mobility comments: verbal and tactile cues, Max(A) throughout and patient limited by back pain while attempting mobility   Transfers                 General transfer comment: unable to attempt   Ambulation/Gait             General Gait Details: unable to attempt   Stairs            Wheelchair Mobility    Modified Rankin (Stroke Patients Only)       Balance Overall balance assessment: History of Falls                                           Pertinent Vitals/Pain Pain Assessment: No/denies pain    Home Living Family/patient expects to be discharged to:: Skilled nursing facility  Additional Comments: other equipment is unknown     Prior Function Level of Independence: Needs assistance   Gait / Transfers Assistance Needed: unknown   ADL's / Homemaking Assistance Needed: unknown   Comments: unknown, patient unable to provide accurate history      Hand Dominance        Extremity/Trunk Assessment   Upper Extremity Assessment: Defer to OT evaluation           Lower Extremity Assessment: Generalized weakness;Difficult to assess due to impaired cognition       Cervical / Trunk Assessment: Kyphotic  Communication   Communication: HOH  Cognition Arousal/Alertness: Awake/alert Behavior During Therapy: WFL for tasks assessed/performed Overall Cognitive Status: History of cognitive impairments - at baseline (per PMH, history of dementia; A&Ox2 )       Memory: Decreased recall of precautions;Decreased short-term memory              General Comments General comments (skin integrity, edema, etc.): unable to get to sitting/weight bearing to assess balance, however per PMH she does have a history of regular falls (chronic falling)    Exercises     Assessment/Plan    PT Assessment Patient needs continued PT services  PT Problem List Decreased strength;Decreased mobility;Decreased safety awareness;Decreased coordination;Decreased activity tolerance;Decreased balance          PT Treatment Interventions DME instruction;Therapeutic activities;Gait training;Therapeutic exercise;Patient/family education;Stair training;Balance training;Functional mobility training;Neuromuscular re-education    PT Goals (Current goals can be found in the Care Plan section)  Acute Rehab PT Goals PT Goal Formulation: Patient unable to participate in goal setting Time For Goal Achievement: 08/19/16 Potential to Achieve Goals: Fair    Frequency Min 4X/week   Barriers to discharge Other (comment) cognitive status     Co-evaluation               End of Session   Activity Tolerance: Patient limited by pain Patient left: in bed;with call bell/phone within reach;with bed alarm set Nurse Communication: Other (comment) (requested CNA come to clean patient due to soiled linens )    Functional Assessment Tool Used: Based on skilled clinical assessment of functional mobilty, command following, participation in PT, orientation, general functional strength  Functional Limitation: Mobility: Walking and moving around Mobility: Walking and Moving Around  Current Status ([id number]): At least 80 percent but less than 100 percent impaired, limited or restricted Mobility: Walking and Moving Around Goal Status [phone]): At least 60 percent but less than 80 percent impaired, limited or restricted    Time: 0937-0958 PT Time Calculation (min) (ACUTE ONLY): 21 min   Charges:   PT Evaluation $PT Eval Low Complexity: 1 Procedure     PT G Codes:   PT G-Codes **NOT FOR INPATIENT CLASS** Functional Assessment Tool Used: Based on skilled clinical assessment of functional mobilty, command following, participation in PT, orientation, general functional strength  Functional Limitation: Mobility: Walking and moving around Mobility: Walking and Moving Around Current Status (L3734): At least 80 percent but less than 100 percent impaired, limited or restricted Mobility: Walking and Moving Around Goal Status [phone]): At least 60 percent but less than 80 percent impaired, limited or restricted   Deniece Ree PT, DPT [phone]

## 2016-08-05 NOTE — Progress Notes (Signed)
*  PRELIMINARY RESULTS* Echocardiogram 2D Echocardiogram has been performed.  Leavy Cella 08/05/2016, 10:33 AM

## 2016-08-06 DIAGNOSIS — I482 Chronic atrial fibrillation: Secondary | ICD-10-CM

## 2016-08-06 LAB — HEMOGLOBIN A1C
HEMOGLOBIN A1C: 8.9 % — AB (ref 4.8–5.6)
MEAN PLASMA GLUCOSE: 209 mg/dL

## 2016-08-06 LAB — GLUCOSE, CAPILLARY
GLUCOSE-CAPILLARY: 189 mg/dL — AB (ref 65–99)
GLUCOSE-CAPILLARY: 195 mg/dL — AB (ref 65–99)
GLUCOSE-CAPILLARY: 212 mg/dL — AB (ref 65–99)
GLUCOSE-CAPILLARY: 230 mg/dL — AB (ref 65–99)
Glucose-Capillary: 225 mg/dL — ABNORMAL HIGH (ref 65–99)
Glucose-Capillary: 125 mg/dL — ABNORMAL HIGH (ref 65–99)

## 2016-08-06 NOTE — Progress Notes (Signed)
Follow-up note  I spoke with her granddaughter Alicia Mason over telephone today, explained [name]'s poor prognosis and probable limited life expectancy. Unfortunately Mr Canale has suffered a stroke that has resulted in significant disability including compromised swallow function. She has a history of A. fib for which anticoagulation was stopped 1 year ago due to severe GI bleed. Her granddaughter verbalize understanding of this and expresses wishes to focus care on comfort. She agrees with palliative care consult.

## 2016-08-06 NOTE — Progress Notes (Signed)
PROGRESS NOTE    Alicia Mason  [id number] DOB: 1922/08/03 DOA: 08/04/2016 PCP: Glo Herring., MD   Brief Narrative:  Alicia Mason is a 82 year old female having multiple comorbidities including a history of atrial fibrillation, chronic systolic heart failure, history of severe GI bleed for which anticoagulation was discontinued, admitted to the medicine service on 08/04/2016 when she presented with complaints of left-sided weakness and slurred speech. She presented as a transfer from her nursing home. On initial evaluation she had flaccid paralysis of her left extremity along with left-sided facial droop and slurred speech. Initially she failed her swallow screen. She was placed on the stroke protocol. She was not felt to be candidate for thrombolytic therapy. MRI of brain revealed an acute right MCA infarct involving posterior frontal lobe and insula. She was seen and evaluated by neurology. Treated with aspirin.   Assessment & Plan:   Active Problems:   Atrial fibrillation (HCC)   DM type 2 (diabetes mellitus, type 2) ([place])   Acute encephalopathy   Essential hypertension   Acute CVA (cerebrovascular accident) ([place])   Chronic systolic (congestive) heart failure ([place])  1.  Acute CVA -Alicia Mason is a pleasant 74-year-old female having a history of atrial fibrillation, previously had been on anticoagulants was relatively low, stopped about a year ago after she presented with a severe GI bleed -She presented with left-sided weakness and slurred speech -MRI of brain showing acute right MCA infarct involving posterior frontal lobe and insula -Per nursing staff she failed swallow screen -Neurology consulted, felt to have poor prognosis, and recommending palliative care consult.  SLP recommending dysphagia 1 diet -Plan to meet with family  2.  History of atrial fibrillation -CHADSVasc score of at least 5 -Have been anticoagulant with the results of until year ago when this was  discontinued when she presented with severe GI bleed and hemoglobin 5.7. -Unfortunately she now presents with CVA -Controlled for the most part with heart rates in the 70's-90's  3.  Chronic systolic congestive heart failure. A transthoracic echocardiogram from 2015 showed an EF of 40-45% with severe hypokinesis. She is currently receiving IV fluids since she is nothing by mouth. Monitor volume status closely.  4.  History of hypertension. Antihypertensive agents held to favor cerebral perfusion in the setting of stroke  5.  History of type 2 diabetes mellitus. She remains nothing by mouth, was hypoglycemic overnight for which she was administered dextrose. Hypoglycemic agents held.   DVT prophylaxis: Lovenox Code Status: DO NOT RESUSCITATE Family Communication: I spoke to her sister and granddaughter Disposition Plan:   Consultants:   Neurology  Subjective: No improvement to neurologic deficits, seems a little more lethargic today  Objective: Vitals:   08/05/16 2200 08/06/16 0030 08/06/16 0430 08/06/16 0830  BP:  138/76 124/72 (!) 143/105  Pulse:  95 86 93  Resp: 16  16 16   Temp:  98.6 F (37 C) 98.7 F (37.1 C) 97.6 F (36.4 C)  TempSrc:  Oral Oral Axillary  SpO2: 97%  98% 100%  Weight:      Height:        Intake/Output Summary (Last 24 hours) at 08/06/16 1033 Last data filed at 08/06/16 0905  Gross per 24 hour  Intake              240 ml  Output                1 ml  Net  239 ml   Filed Weights   08/04/16 0822 08/04/16 0829 08/04/16 1630  Weight: 58.5 kg (128 lb 14.4 oz) 58.5 kg (128 lb 14.4 oz) 54.8 kg (120 lb 12.8 oz)    Examination:  General exam: Appears calm and comfortable  Respiratory system: Clear to auscultation. Respiratory effort normal. Cardiovascular system: Irregular rate and rhythm. No JVD, murmurs, rubs, gallops or clicks. No pedal edema. Gastrointestinal system: Abdomen is nondistended, soft and nontender. No organomegaly or  masses felt. Normal bowel sounds heard. Central nervous system: She continues to have left upper extremity paralysis, 3-4 out of 5 muscle strength to her left lower extremity, left-sided neglect, facial droop at slurred speech Extremities: Symmetric 5 x 5 power. Skin: No rashes, lesions or ulcers Psychiatry: She is confused and disoriented    Data Reviewed: I have personally reviewed following labs and imaging studies  CBC:  Recent Labs Lab 08/04/16 0738 08/04/16 0746 08/05/16 0500  WBC 9.5  --  8.2  NEUTROABS 7.0  --   --   HGB 11.9* 13.3 12.2  HCT 36.8 39.0 38.0  MCV 88.9  --  89.6  PLT 252  --  [id number]   Basic Metabolic Panel:  Recent Labs Lab 08/04/16 0738 08/04/16 0746 08/05/16 0500  NA 137 138 134*  K 4.2 4.2 3.6  CL 98* 96* 102  CO2 28  --  26  GLUCOSE 274* 270* 165*  BUN 25* 24* 18  CREATININE 1.01* 1.10* 0.87  CALCIUM 8.8*  --  8.0*   GFR: Estimated Creatinine Clearance: 35.7 mL/min (by C-G formula based on SCr of 0.87 mg/dL). Liver Function Tests:  Recent Labs Lab 08/04/16 0738  AST 20  ALT 16  ALKPHOS 108  BILITOT 1.0  PROT 7.5  ALBUMIN 4.2   No results for input(s): LIPASE, AMYLASE in the last 168 hours. No results for input(s): AMMONIA in the last 168 hours. Coagulation Profile:  Recent Labs Lab 08/04/16 0738  INR 1.00   Cardiac Enzymes: No results for input(s): CKTOTAL, CKMB, CKMBINDEX, TROPONINI in the last 168 hours. BNP (last 3 results) No results for input(s): PROBNP in the last 8760 hours. HbA1C: No results for input(s): HGBA1C in the last 72 hours. CBG:  Recent Labs Lab 08/05/16 1622 08/05/16 2000 08/06/16 0030 08/06/16 0452 08/06/16 0806  GLUCAP 202* 224* 195* 212* 189*   Lipid Profile:  Recent Labs  08/05/16 0500  CHOL 161  HDL 31*  LDLCALC 112*  TRIG 91  CHOLHDL 5.2   Thyroid Function Tests: No results for input(s): TSH, T4TOTAL, FREET4, T3FREE, THYROIDAB in the last 72 hours. Anemia Panel: No results for  input(s): VITAMINB12, FOLATE, FERRITIN, TIBC, IRON, RETICCTPCT in the last 72 hours. Sepsis Labs: No results for input(s): PROCALCITON, LATICACIDVEN in the last 168 hours.  Recent Results (from the past 240 hour(s))  MRSA PCR Screening     Status: None   Collection Time: 08/04/16  6:14 PM  Result Value Ref Range Status   MRSA by PCR NEGATIVE NEGATIVE Final    Comment:        The GeneXpert MRSA Assay (FDA approved for NASAL specimens only), is one component of a comprehensive MRSA colonization surveillance program. It is not intended to diagnose MRSA infection nor to guide or monitor treatment for MRSA infections.          Radiology Studies: Mr Virgel Paling [id number] Contrast  Result Date: 08/04/2016 CLINICAL DATA:  Left-sided weakness and slurred speech. EXAM: MRI HEAD WITHOUT CONTRAST MRA HEAD  WITHOUT CONTRAST TECHNIQUE: Multiplanar, multiecho pulse sequences of the brain and surrounding structures were obtained without intravenous contrast. Angiographic images of the head were obtained using MRA technique without contrast. COMPARISON:  Head CT and CTA 08/04/2016.  Brain MRI 08/04/2013. FINDINGS: MRI HEAD FINDINGS The study is moderately motion degraded. There is a small acute right MCA infarct involving the posterior right frontal lobe, predominantly the operculum, as well as a portion of the right insula. No gross intracranial hemorrhage, intra-axial mass, midline shift, or extra-axial fluid collection is seen. There is moderate cerebral atrophy. Chronic bilateral parietal cortical infarcts are new from the prior MRI. T2 hyperintensities elsewhere in the deep cerebral white matter bilaterally are similar to the prior MRI and compatible with chronic small vessel ischemia. Extra-axial mass in the posterior fossa along the petrous temporal bone measures 19 x 12 mm, slightly smaller than on the prior MRI. Prior bilateral cataract extraction is noted. Mild mucosal thickening and small to moderate  volume fluid in the right maxillary sinus. Clear mastoid air cells. Major intracranial vascular flow voids are grossly preserved. 15 mm T2 hyperintense is stable to minimally larger than on the prior MRI, favored to be benign. MRA HEAD FINDINGS The study is moderately to severely motion degraded. The V4 segments of the vertebral arteries are not well evaluated due to motion artifact. Flow is grossly identified in the dominant left vertebral artery. The basilar artery is patent without evidence of significant stenosis. There is a patent right posterior communicating artery with hypoplastic right P1 segment. The left P1 segment is patent with a high-grade stenosis again seen near the P1-P2 junction. Artifact precludes assessment of the P2 in more distal PCAs. The internal carotid arteries are patent from skullbase to carotid termini. Focal areas of relative signal dropout in both ICAs are felt to be artifactual due to lack of high-grade stenosis on earlier CTA. There is likely mild stenosis involving the anterior genu and supraclinoid segments bilaterally. M1 segments are patent proximally. More distal M1 segments, MCA bifurcations, and MCA branch vessels are inadequately evaluated due to motion artifact and poor signal. There is gross flow related enhancement in the A1 and A2 segments, however evaluation for underlying stenosis is severely limited by artifact. IMPRESSION: 1. Acute right MCA infarct involving the posterior frontal lobe and insula. 2. Chronic bilateral parietal infarcts. 3. 19 mm meningioma along the left petrous temporal bone, slightly smaller than in 2014. 4. Moderately to severely motion degraded head MRA as above. Intracranial circulation much better assessed on today's earlier CTA. Electronically Signed   By: Logan Bores M.D.   On: 08/04/2016 12:05   Mr Brain Wo Contrast (neuro Protocol)  Result Date: 08/04/2016 CLINICAL DATA:  Left-sided weakness and slurred speech. EXAM: MRI HEAD WITHOUT  CONTRAST MRA HEAD WITHOUT CONTRAST TECHNIQUE: Multiplanar, multiecho pulse sequences of the brain and surrounding structures were obtained without intravenous contrast. Angiographic images of the head were obtained using MRA technique without contrast. COMPARISON:  Head CT and CTA 08/04/2016.  Brain MRI 08/04/2013. FINDINGS: MRI HEAD FINDINGS The study is moderately motion degraded. There is a small acute right MCA infarct involving the posterior right frontal lobe, predominantly the operculum, as well as a portion of the right insula. No gross intracranial hemorrhage, intra-axial mass, midline shift, or extra-axial fluid collection is seen. There is moderate cerebral atrophy. Chronic bilateral parietal cortical infarcts are new from the prior MRI. T2 hyperintensities elsewhere in the deep cerebral white matter bilaterally are similar to the prior MRI  and compatible with chronic small vessel ischemia. Extra-axial mass in the posterior fossa along the petrous temporal bone measures 19 x 12 mm, slightly smaller than on the prior MRI. Prior bilateral cataract extraction is noted. Mild mucosal thickening and small to moderate volume fluid in the right maxillary sinus. Clear mastoid air cells. Major intracranial vascular flow voids are grossly preserved. 15 mm T2 hyperintense is stable to minimally larger than on the prior MRI, favored to be benign. MRA HEAD FINDINGS The study is moderately to severely motion degraded. The V4 segments of the vertebral arteries are not well evaluated due to motion artifact. Flow is grossly identified in the dominant left vertebral artery. The basilar artery is patent without evidence of significant stenosis. There is a patent right posterior communicating artery with hypoplastic right P1 segment. The left P1 segment is patent with a high-grade stenosis again seen near the P1-P2 junction. Artifact precludes assessment of the P2 in more distal PCAs. The internal carotid arteries are patent  from skullbase to carotid termini. Focal areas of relative signal dropout in both ICAs are felt to be artifactual due to lack of high-grade stenosis on earlier CTA. There is likely mild stenosis involving the anterior genu and supraclinoid segments bilaterally. M1 segments are patent proximally. More distal M1 segments, MCA bifurcations, and MCA branch vessels are inadequately evaluated due to motion artifact and poor signal. There is gross flow related enhancement in the A1 and A2 segments, however evaluation for underlying stenosis is severely limited by artifact. IMPRESSION: 1. Acute right MCA infarct involving the posterior frontal lobe and insula. 2. Chronic bilateral parietal infarcts. 3. 19 mm meningioma along the left petrous temporal bone, slightly smaller than in 2014. 4. Moderately to severely motion degraded head MRA as above. Intracranial circulation much better assessed on today's earlier CTA. Electronically Signed   By: Logan Bores M.D.   On: 08/04/2016 12:05   US Carotid Bilateral  Result Date: 08/04/2016 CLINICAL DATA:  Acute CVA 1 day ago.  Acute right MCA infarct. EXAM: BILATERAL CAROTID DUPLEX ULTRASOUND TECHNIQUE: Pearline Cables scale imaging, color Doppler and duplex ultrasound were performed of bilateral carotid and vertebral arteries in the neck. COMPARISON:  Brain MRI 08/04/2016 FINDINGS: Criteria: Quantification of carotid stenosis is based on velocity parameters that correlate the residual internal carotid diameter with NASCET-based stenosis levels, using the diameter of the distal internal carotid lumen as the denominator for stenosis measurement. The following velocity measurements were obtained: RIGHT ICA:  58 cm/sec CCA:  41 cm/sec SYSTOLIC ICA/CCA RATIO:  1.4 DIASTOLIC ICA/CCA RATIO:  2.1 ECA:  134 cm/sec LEFT ICA:  69 cm/sec CCA:  50 cm/sec SYSTOLIC ICA/CCA RATIO:  1.4 DIASTOLIC ICA/CCA RATIO:  1.8 ECA:  120 cm/sec RIGHT CAROTID ARTERY: Moderate amount of plaque at the right carotid bulb.  There is irregular plaque at the origin of the external carotid artery without significant stenosis. Plaque at the origin of the internal carotid artery without significant stenosis based on the waveforms and velocities. RIGHT VERTEBRAL ARTERY: Antegrade flow and normal waveform in the right vertebral artery. LEFT CAROTID ARTERY: Calcified plaque with shadowing at the left carotid bulb. Small amount of plaque in the proximal left external carotid artery. Small amount of plaque in the proximal internal carotid artery. No significant stenosis in the left internal carotid artery based on the waveforms and velocities. LEFT VERTEBRAL ARTERY: Antegrade flow and normal waveform in the left vertebral artery. IMPRESSION: Atherosclerotic disease in the bilateral carotid arteries, most prominent at the right  carotid bulb. Estimated degree of stenosis in the internal carotid arteries is less than 50% bilaterally. Patent vertebral arteries. Electronically Signed   By: Markus Daft M.D.   On: 08/04/2016 14:34        Scheduled Meds: .  stroke: mapping our early stages of recovery book   Does not apply Once  . aspirin  300 mg Rectal Daily   Or  . aspirin  325 mg Oral Daily  . enoxaparin (LOVENOX) injection  30 mg Subcutaneous Q24H  . insulin aspart  0-15 Units Subcutaneous Q4H   Continuous Infusions: . sodium chloride 75 mL/hr at 08/04/16 1334  . dextrose 30 mL/hr at 08/05/16 0600     LOS: 2 days    Time spent: 25 min    Kelvin Cellar, MD [facility] Pager [phone]  If 7PM-7AM, please contact night-coverage [email or web address] Password [facility] 08/06/2016, 10:33 AM

## 2016-08-06 NOTE — Progress Notes (Signed)
Spoke with granddaughter, Eustaquio Maize, about patient at length.  Made a note that she wants to speak with Tasha/palliative care tomorrow, possibly face-to-face if she travels from [place].  If not, Eustaquio Maize is requesting an phone call from palliative tomorrow.  Thank you. Beth's phone number [phone].

## 2016-08-07 ENCOUNTER — Encounter (HOSPITAL_COMMUNITY): Payer: Self-pay | Admitting: Primary Care

## 2016-08-07 DIAGNOSIS — Z7189 Other specified counseling: Secondary | ICD-10-CM

## 2016-08-07 DIAGNOSIS — I639 Cerebral infarction, unspecified: Secondary | ICD-10-CM

## 2016-08-07 DIAGNOSIS — Z515 Encounter for palliative care: Secondary | ICD-10-CM

## 2016-08-07 LAB — GLUCOSE, CAPILLARY
GLUCOSE-CAPILLARY: 108 mg/dL — AB (ref 65–99)
GLUCOSE-CAPILLARY: 129 mg/dL — AB (ref 65–99)
GLUCOSE-CAPILLARY: 79 mg/dL (ref 65–99)
Glucose-Capillary: 110 mg/dL — ABNORMAL HIGH (ref 65–99)
Glucose-Capillary: 163 mg/dL — ABNORMAL HIGH (ref 65–99)
Glucose-Capillary: 167 mg/dL — ABNORMAL HIGH (ref 65–99)
Glucose-Capillary: 184 mg/dL — ABNORMAL HIGH (ref 65–99)

## 2016-08-07 MED ORDER — METOPROLOL TARTRATE 25 MG PO TABS
12.5000 mg | ORAL_TABLET | Freq: Two times a day (BID) | ORAL | Status: DC
  Administered 2016-08-07 – 2016-08-08 (×3): 12.5 mg via ORAL
  Filled 2016-08-07 (×3): qty 1

## 2016-08-07 NOTE — Consult Note (Signed)
Consultation Note Date: 08/07/2016   Patient Name: Alicia Mason  DOB: 03/31/1922  MRN: [id number]  Age / Sex: 82 y.o., female  PCP: Redmond School, MD Referring Physician: Kelvin Cellar, MD  Reason for Consultation: Disposition, Establishing goals of care, Hospice Evaluation and Psychosocial/spiritual support  HPI/Patient Profile: 82 y.o. female  with past medical history of Acute ischemic stroke in 2014, dementia, chronic heart failure with the EF of 40%, CABG in 1999, NSTEMI in 2012 and 2015, a fib without anticoagulation due to history of G.I. bleed, chronic anxiety, closed right humerus fracture without surgery, admitted on 08/04/2016 with acute stroke.   Clinical Assessment and Goals of Care: Alicia Mason is resting quietly in bed. She response when I greet her. She makes eye contact. Present today is her sister Judene Companion. Alicia Mason denies pain at this time and seems pleasantly confused. Alicia Mason about her relationship with her sister at first stating, "her mind is sharp". But after the mini mental status exam, Alicia Mason that she is not surprised that Alicia Mason has done so poorly.  Call to granddaughter Alicia Mason. She states that she and her sister are nearby and will arrive at the hospital in 5 minutes. I meet them at the elevators with Dr. Coralyn Pear, who answers their questions. Alicia Mason and Alicia Mason come to my office for a family discussion. We talk at length about Alicia Mason's chronic health conditions and her acute stroke. They seem very knowledgeable about her health. We talk about her swallowing concerns and her specialized diet. We also talk about the recovery period with stroke. We talk and I share a diagram of the chronic illness.    Alicia Mason and Eustaquio Maize are very appropriate regarding their grandmothers decline since her 1st stroke in '08 (when she started to show signs of dementia). They share her  previous life and what was important to her, stating that she is no longer able to do the things that give her pleasure. Alicia Mason states several times that she is very concerned about swallowing problems. We talk about issues associated with dysphasia. I share our concerns over keeping people hydrated, and the cycle of returning to the hospital multiple times. We talk about advanced directives and the MOST form. We review the MOST form in detail. Alicia Mason states that she is interested in pursuing comfort care, but Alicia Mason and Eustaquio Maize will talk about this tonight and return with the MOST form tomorrow for MD signature.    Alicia Mason and Eustaquio Maize state that their goal is for Alicia Mason to return to [facility] with the benefits of hospice. They share that Alicia Mason would not want to rehab. They talk about having rehab after her last stroke and that she refused to participate, "she just sat there".  We talk about the benefits of hospice in detail. We talk about the natural changes that occur as we near end of life. We also talk about prognosis, this is based on how Alicia Mason is able to recover from her stroke, her functional decline,  and her other chronic illnesses. We talk about SNF as resident if Nanine Means is unable/unwilling to continue to care for Alicia Mason. Alicia Mason and Alicia Mason understand that this would be as private pay.  Healthcare power of attorney HCPOA - granddaughters [name] then [employer].  Sister Judene Companion is only allowed to make decisions if neither [place] nor Eustaquio Maize are available. [place] and [employer] share that there has been confusion regarding responsible party in the past.  [place]   family is leaning toward full comfort care at this time, with the goal of returning to [facility] facility under the benefits of hospice. They are reviewing the MOST form to be signed by Dr. Coralyn Pear tomorrow.  Code Status/Advance Care Planning:  DNR  Symptom  Management:   per hospitalist  Palliative Prophylaxis:   Aspiration, Frequent Pain Assessment and Turn Reposition  Additional Recommendations (Limitations, Scope, Preferences):  Family is considering their option to provide comfort measures only. They will review the MOST form today and return tomorrow with completed form.  Psycho-social/Spiritual:   Desire for further Chaplaincy support:no  Additional Recommendations: Caregiving  Support/Resources and Education on Hospice  Prognosis:   < 6 months, possibly less, based on outcomes. Based on dysphasia and functional decline related to current stroke, history of stroke, history of extensive cardiovascular burden.  Discharge Planning: Family is requesting that Alicia Mason be return to [facility] under the benefits of hospice.      Primary Diagnoses: Present on Admission: . Acute CVA (cerebrovascular accident) ([place]) . Acute encephalopathy . Atrial fibrillation ([place]) . Essential hypertension . Chronic systolic (congestive) heart failure (HCC)   I have reviewed the medical record, interviewed the patient and family, and examined the patient. The following aspects are pertinent.  Past Medical History:  Diagnosis Date  . Acute ischemic stroke ([place]) 08/04/2013   Right MCA stroke  . Acute on chronic combined systolic and diastolic heart failure ([place]) 10/01/2014   EF [id number]; grade 2 diastolic dysfunction  . Arthritis   . Atrial fibrillation ([place]) 05/28/2009   Paroxysmal    . Cerebrovascular disease   . Chronic anxiety   . Closed fracture of right humerus    Managed conservatively 2011  . Colon polyps 07/08/2015  . Coronary atherosclerosis of native coronary artery 1999   CABG LIMA to LAD, SVG to OM1 and OM2 and SVG to PDA in 1999,   . Dementia   . Diabetes mellitus   . Dieulafoy lesion of colon 07/08/2015  . Dyslipidemia   . Essential hypertension, benign   . Ischemic cardiomyopathy    45-50%  07/2013/ EF 40% per Echo 06/2011.  Marland Kitchen Near syncope   . NSTEMI (non-ST elevated myocardial infarction) ([place])    10/02/2014 & 06/2011.  Marland Kitchen Pyloric stenosis 07/08/2015  . Seasonal allergies   . Type 2 diabetes mellitus ([place])   . Vertigo    Social History   Social History  . Marital status: Widowed    Spouse name: N/A  . Number of children: N/A  . Years of education: N/A   Occupational History  . Retired    Social History Main Topics  . Smoking status: Never Smoker  . Smokeless tobacco: Never Used  . Alcohol use No  . Drug use: No  . Sexual activity: Not Currently   Other Topics Concern  . None   Social History Narrative  . None   Family History  Problem Relation Age of Onset  . Pneumonia Mother   .  CAD Sister     Died with MI age 65  . Arthritis Other   . Coronary artery disease Other   . Diabetes Other   . Cancer Other   . Colon cancer Neg Hx    Scheduled Meds: .  stroke: mapping our early stages of recovery book   Does not apply Once  . aspirin  300 mg Rectal Daily   Or  . aspirin  325 mg Oral Daily  . insulin aspart  0-15 Units Subcutaneous Q4H  . metoprolol tartrate  12.5 mg Oral BID   Continuous Infusions:  PRN Meds:.morphine injection, RESOURCE THICKENUP CLEAR, senna-docusate Medications Prior to Admission:  Prior to Admission medications   Medication Sig Start Date End Date Taking? Authorizing Provider  ALPRAZolam Duanne Moron) 0.5 MG tablet Take 1 tablet (0.5 mg total) by mouth 2 (two) times daily as needed for anxiety or sleep. 12/26/14  Yes Tanna Furry, MD  aluminum-magnesium hydroxide-simethicone (MAALOX) 200-200-20 MG/5ML SUSP Take 15 mLs by mouth daily as needed (heartburn).   Yes Historical Provider, MD  cetirizine (ZYRTEC) 10 MG tablet Take 10 mg by mouth daily.   Yes Historical Provider, MD  Dextrose, Diabetic Use, (GLUCOSE) 77.4 % GEL Take 1 Applicatorful by mouth daily as needed (hypoglycemia).   Yes Historical Provider, MD  donepezil (ARICEPT) 10 MG  tablet Take 10 mg by mouth at bedtime.   Yes Historical Provider, MD  furosemide (LASIX) 20 MG tablet Take 1 tablet (20 mg total) by mouth 2 (two) times daily. For treatment of heart failure. Patient taking differently: Take 20 mg by mouth daily. For treatment of heart failure. 07/10/15  Yes Rexene Alberts, MD  HYDROcodone-acetaminophen (NORCO/VICODIN) 5-325 MG per tablet Take 1 tablet by mouth daily as needed for moderate pain.    Yes Historical Provider, MD  Insulin Glargine (LANTUS SOLOSTAR) 100 UNIT/ML Solostar Pen Inject 10 Units into the skin every morning.    Yes Historical Provider, MD  loperamide (IMODIUM A-D) 2 MG tablet Take 2 mg by mouth every 6 (six) hours as needed for diarrhea or loose stools.   Yes Historical Provider, MD  metoprolol tartrate (LOPRESSOR) 25 MG tablet Take 25 mg by mouth daily.   Yes Historical Provider, MD  Phenylephrine-DM-GG (ROBITUSSIN CHILD COUGH/COLD CF) 2.03-24-49 MG/5ML LIQD Take 20 mLs by mouth 3 (three) times daily as needed (cough).   Yes Historical Provider, MD  potassium chloride (K-DUR) 10 MEQ tablet Take 1 tablet (10 mEq total) by mouth 2 (two) times daily. Potassium. Take each tablet with furosemide twice daily. Patient taking differently: Take 10 mEq by mouth daily. Potassium. Take each tablet with furosemide twice daily. 07/10/15  Yes Rexene Alberts, MD  ranitidine (ZANTAC) 150 MG tablet Take 150 mg by mouth daily as needed for heartburn.   Yes Historical Provider, MD  metoprolol (LOPRESSOR) 50 MG tablet Take 1 tablet (50 mg total) by mouth 2 (two) times daily. Patient not taking: Reported on 08/04/2016 06/01/14   Nita Sells, MD   Allergies  Allergen Reactions  . Cephalexin     NOT SURE OF THE REACTION, BUT WAS TOLD NOT TO TAKE IT. Granddaughter mentioned that patient had hives, rash. Denies anaphylactic reaction.   Review of Systems  Unable to perform ROS: Dementia    Physical Exam  Constitutional: No distress.  Frail appearing, no acute  distress  HENT:  Head: Normocephalic and atraumatic.  Cardiovascular: Normal rate.   Pulmonary/Chest: Effort normal. No respiratory distress.  Abdominal: Soft. She exhibits no distension.  Musculoskeletal:  Leg strength 2/5 left lower extremity arm strength 2/5 left upper extremity  Neurological: She is alert.  Oriented to person only  Skin: Skin is warm and dry.  Nursing note and vitals reviewed.   Vital Signs: BP (!) 138/92 (BP Location: Left Arm)   Pulse (!) 118   Temp 98.1 F (36.7 C) (Oral)   Resp (!) 24   Ht 5\' 5"  (1.651 m)   Wt 54.8 kg (120 lb 12.8 oz)   SpO2 99%   BMI 20.10 kg/m  Pain Assessment: No/denies pain POSS *See Group Information*: 1-Acceptable,Awake and alert Pain Score: 0-No pain   SpO2: SpO2: 99 % O2 Device:SpO2: 99 % O2 Flow Rate: .O2 Flow Rate (L/min): 1 L/min  IO: Intake/output summary:  Intake/Output Summary (Last 24 hours) at 08/07/16 1520 Last data filed at 08/06/16 1750  Gross per 24 hour  Intake              120 ml  Output                0 ml  Net              120 ml    LBM:   Baseline Weight: Weight: 58.5 kg (128 lb 14.4 oz) Most recent weight: Weight: 54.8 kg (120 lb 12.8 oz)     Palliative Assessment/Data:   Flowsheet Rows   Flowsheet Row Most Recent Value  Intake Tab  Referral Department  Hospitalist  Unit at Time of Referral  Med/Surg Unit  Palliative Care Primary Diagnosis  Neurology  Date Notified  08/06/16  Palliative Care Type  New Palliative care  Reason for referral  Clarify Goals of Care  Date of Admission  08/04/16  # of days IP prior to Palliative referral  2  Clinical Assessment  Palliative Performance Scale Score  30%  Pain Max last 24 hours  0  Pain Min Last 24 hours  0  Dyspnea Max Last 24 Hours  Not able to report  Dyspnea Min Last 24 hours  Not able to report  Psychosocial & Spiritual Assessment  Palliative Care Outcomes  Patient/Family meeting held?  Yes  Who was at the meeting?   granddaughters Alicia Mason  Dye in [place]  Provided advance care planning, Counseled regarding hospice, Clarified goals of care, Provided psychosocial or spiritual support  Patient/Family wishes: Interventions discontinued/not started   Mechanical Ventilation  Palliative Care follow-up planned  -- [Follow-up while at [facility]]      Time In: 1510 Time Out: 1720 Time Total: 130 minutes Greater than 50%  of this time was spent counseling and coordinating care related to the above assessment and plan.  Signed by: Drue Novel, NP   Please contact Palliative Medicine Team phone at [phone] for questions and concerns.  For individual provider: See Shea Evans

## 2016-08-07 NOTE — Care Management Important Message (Signed)
Important Message  Patient Details  Name: Alicia Mason MRN: [id number] Date of Birth: 12-22-1921   Medicare Important Message Given:  Yes    Sherald Barge, RN 08/07/2016, 2:10 PM

## 2016-08-07 NOTE — Evaluation (Signed)
Occupational Therapy Evaluation Patient Details Name: Alicia Mason MRN: [id number] DOB: [birth year] Today's Date: 08/07/2016    History of Present Illness Alicia Mason is a 82 y.o. female with multiple comorbidities, including having history of atrial fibrillation, systolic heart failure last transthoracic echocardiogram performed in 2015 that revealed reduced ejection fraction of 40-45%, history of severe GI bleed for which Xarelto was discontinued, insulin-dependent diabetes mellitus, hypertension, presented as a transfer to the emergency department from her skilled nursing facility. This morning she was found to have left-sided weakness associated with slurred speech by nursing home staff, last seen normal at 5 AM with medicine administration. Nursing home staff reporting that she had difficulties swallowing and had been slurring her words which was not baseline for her. History is limited from her, I obtained the above history from emergency room staff, nursing home staff, family members   Clinical Impression   Pt awake and alert this afternoon, sister present for OT evaluation. OT evaluation completed at bed level as pt unwilling to attempt bed mobility at this time. Pt currently requiring max-total assistance for ADL tasks due to left side weakness and cognitive deficits. Prior level of function is unknown, sister reports pt required assistance for all ADL tasks, however was able to feed herself and walk with a walker. Will continue to follow while in acute care.   Follow Up Recommendations  SNF    Equipment Recommendations  None recommended by OT       Precautions / Restrictions Precautions Precautions: Fall Restrictions Weight Bearing Restrictions: No      Mobility Bed Mobility               General bed mobility comments: not assessed   Transfers                 General transfer comment: not assessed         ADL Overall ADL's : Needs  assistance/impaired                                       General ADL Comments: Pt currently requiring max-total assistance for ADL tasks               Pertinent Vitals/Pain Pain Assessment: No/denies pain     Hand Dominance Right   Extremity/Trunk Assessment Upper Extremity Assessment Upper Extremity Assessment: LUE deficits/detail LUE Deficits / Details: LUE strength: shoulder/elbow/hand 2/5. Grip 1/5 LUE Coordination: decreased fine motor;decreased gross motor   Lower Extremity Assessment Lower Extremity Assessment: Defer to PT evaluation       Communication Communication Communication: HOH   Cognition Arousal/Alertness: Awake/alert Behavior During Therapy: WFL for tasks assessed/performed Overall Cognitive Status: History of cognitive impairments - at baseline (dementia at baseline)       Memory: Decreased recall of precautions;Decreased short-term memory                        Home Living Family/patient expects to be discharged to:: Skilled nursing facility                                 Additional Comments: other equipment is unknown       Prior Functioning/Environment Level of Independence: Needs assistance    ADL's / Homemaking Assistance Needed: Per pt sister: pt required assistance from SNF  staff for all ADL tasks. Was able to feed herself            OT Problem List: Decreased strength;Decreased range of motion;Decreased activity tolerance;Impaired balance (sitting and/or standing);Decreased coordination;Decreased cognition;Decreased safety awareness;Decreased knowledge of use of DME or AE;Impaired UE functional use   OT Treatment/Interventions: Self-care/ADL training;Therapeutic exercise;Neuromuscular education;Therapeutic activities;Cognitive remediation/compensation;Patient/family education    OT Goals(Current goals can be found in the care plan section) Acute Rehab OT Goals Patient Stated Goal: none  stated OT Goal Formulation: Patient unable to participate in goal setting Time For Goal Achievement: 08/21/16 Potential to Achieve Goals: Fair  OT Frequency: Min 2X/week              End of Session    Activity Tolerance: Patient limited by lethargy Patient left: in bed;with call bell/phone within reach;with bed alarm set;with family/visitor present   Time: [id number] OT Time Calculation (min): 18 min Charges:  OT General Charges $OT Visit: 1 Procedure OT Evaluation $OT Eval Moderate Complexity: 1 Procedure Guadelupe Sabin, OTR/L  [phone] 08/07/2016, 4:56 PM

## 2016-08-07 NOTE — Care Management Note (Signed)
Case Management Note  Patient Details  Name: Alicia Mason MRN: [id number] Date of Birth: [birth year]  Subjective/Objective:                  Pt is from [facility]. PT has recommended SNF. CSW is aware of recommendation and will make arrangements for placement at [facility]. Palliative also seeing pt this admission.   Action/Plan: No CM needs.   Expected Discharge Date:    08/08/2016              Expected Discharge Plan:  Skilled Nursing Facility  In-House Referral:  Clinical Social Work  Discharge planning Services  CM Consult  Post Acute Care Choice:  NA Choice offered to:  NA  DME Arranged:    DME Agency:     HH Arranged:    [place] Agency:     Status of Service:  Completed, signed off  If discussed at [employer], dates discussed:    Additional Comments:  Sherald Barge, RN 08/07/2016, 2:11 PM

## 2016-08-07 NOTE — Progress Notes (Signed)
Speech Language Pathology Treatment: Dysphagia  Patient Details Name: Alicia Mason MRN: [id number] DOB: [birth year] Today's Date: 08/07/2016 Time: [id number] SLP Time Calculation (min) (ACUTE ONLY): 20 min  Assessment / Plan / Recommendation Clinical Impression  Pt seen at bedside for diet tolerance, trials of advanced consistencies, and caregiver education. Pt was alert and agreeable. She complained of some back pain when HOB increased, however she tolerated sitting up for trials. Her CNA reported good tolerance of puree at lunch and only minimal intake of thickened liquids (better consumption with apple juice). Pt requested "Coke" and was given trials of thin soda. Initially, no overt signs of decreased airway protection, however subtle throat clearing exhibited after trials. Improved performance noted with NTL. Will advance to NTL and continue D1/puree with SLP to follow for tolerance and advancement as appropriate. Sister present for visit and acknowledges recommendations.    HPI HPI: Alicia Mason is a 82 y/o F with multiple comorbidities. The morning of 9/15 she was found to have left-sided weakness associated with slurred speech by nursing home staff, last seen normal at 5 AM with medicine administration. Nursing home staff reporting that she had difficulties swallowing and had been slurring her words which was not baseline for her. History is limited from her, I obtained the above history from emergency room staff, nursing home staff, family members. MRI revealed acute Moderate-sized right MCA infarct due to chronic untreated atrial fibrillation. Overall prognosis for functional recovery poor given baseline comorbidities including advanced dementia and age. Pt failed stroke swallow and SLP will complete clinical swallow evaluation and treat as indicated.      SLP Plan  Continue with current plan of care     Recommendations  Diet recommendations: Dysphagia 1 (puree);Nectar-thick  liquid Liquids provided via: Cup;Straw Medication Administration: Crushed with puree Supervision: Staff to assist with self feeding;Full supervision/cueing for compensatory strategies Compensations: Minimize environmental distractions;Small sips/bites;Multiple dry swallows after each bite/sip;Follow solids with liquid;Clear throat intermittently;Effortful swallow Postural Changes and/or Swallow Maneuvers: Seated upright 90 degrees;Upright 30-60 min after meal                Oral Care Recommendations: Oral care BID;Staff/trained caregiver to provide oral care Follow up Recommendations: Skilled Nursing facility Plan: Continue with current plan of care       Thank you,  Genene Churn, [place]                 [place] 08/07/2016, 4:12 PM

## 2016-08-07 NOTE — Progress Notes (Signed)
PROGRESS NOTE    Alicia Mason  [id number] DOB: 04-30-1922 DOA: 08/04/2016 PCP: Glo Herring., MD   Brief Narrative:  Alicia Mason is a 82 year old female having multiple comorbidities including a history of atrial fibrillation, chronic systolic heart failure, history of severe GI bleed for which anticoagulation was discontinued, admitted to the medicine service on 08/04/2016 when she presented with complaints of left-sided weakness and slurred speech. She presented as a transfer from her nursing home. On initial evaluation she had flaccid paralysis of her left extremity along with left-sided facial droop and slurred speech. Initially she failed her swallow screen. She was placed on the stroke protocol. She was not felt to be candidate for thrombolytic therapy. MRI of brain revealed an acute right MCA infarct involving posterior frontal lobe and insula. She was seen and evaluated by neurology. Treated with aspirin.   Assessment & Plan:   Active Problems:   Atrial fibrillation (HCC)   DM type 2 (diabetes mellitus, type 2) ([place])   Acute encephalopathy   Essential hypertension   Acute CVA (cerebrovascular accident) ([place])   Chronic systolic (congestive) heart failure ([place])  1.  Acute CVA -Alicia Mason is a pleasant 8-year-old female having a history of atrial fibrillation, previously had been on anticoagulants was relatively low, stopped about a year ago after she presented with a severe GI bleed -She presented with left-sided weakness and slurred speech -MRI of brain showing acute right MCA infarct involving posterior frontal lobe and insula -Per nursing staff she failed swallow screen -Neurology consulted, felt to have poor prognosis, and recommending palliative care consult.  SLP recommending dysphagia 1 diet -On 08/07/2016 she seems a little better, more interactive with me  2.  History of atrial fibrillation -CHADSVasc score of at least 5 -Have been anticoagulant with the  results of until year ago when this was discontinued when she presented with severe GI bleed and hemoglobin 5.7. -Unfortunately she now presents with CVA -On 08/07/2016: Plan to restart her metoprolol at 12.5 mg by mouth twice a day  3.  Chronic systolic congestive heart failure.  -A transthoracic echocardiogram from 2015 showed an EF of 40-45% with severe hypokinesis. She is currently receiving IV fluids since she is nothing by mouth. Monitor volume status closely. -On 08/07/2016 looks better, will stop IV fluids and encourage oral intake  4.  History of hypertension.  -Antihypertensive agents held to favor cerebral perfusion in the setting of stroke -On 08/07/2016 will restart her metoprolol at 12.5 mg by mouth twice a day, monitor blood pressures carefully  5.  History of type 2 diabetes mellitus.  -She remains nothing by mouth, was hypoglycemic overnight for which she was administered dextrose. Hypoglycemic agents held. -On 08/07/2016: Diet advanced to dysphagia 1 diet, provide sliding scale coverage for now.   DVT prophylaxis: Lovenox Code Status: DO NOT RESUSCITATE Family Communication: I spoke to her sister and granddaughter Disposition Plan:   Consultants:   Neurology  Subjective: Overall looks little better today, she is more awake and alert, interactive  Objective: Vitals:   08/06/16 1630 08/06/16 2113 08/07/16 0539 08/07/16 0900  BP: 131/73 (!) 156/80 123/74 (!) 138/92  Pulse: 78 68 88 (!) 118  Resp: 15   (!) 24  Temp: 98.7 F (37.1 C) 98.1 F (36.7 C) 98.9 F (37.2 C) 98.1 F (36.7 C)  TempSrc: Oral Oral Oral Oral  SpO2: 99% 99% 98% 99%  Weight:      Height:        Intake/Output Summary (  Last 24 hours) at 08/07/16 1318 Last data filed at 08/06/16 1750  Gross per 24 hour  Intake              120 ml  Output                0 ml  Net              120 ml   Filed Weights   08/04/16 0822 08/04/16 0829 08/04/16 1630  Weight: 58.5 kg (128 lb 14.4 oz) 58.5 kg  (128 lb 14.4 oz) 54.8 kg (120 lb 12.8 oz)    Examination:  General exam: Appears calm and comfortable  Respiratory system: Clear to auscultation. Respiratory effort normal. Cardiovascular system: Irregular rate and rhythm. No JVD, murmurs, rubs, gallops or clicks. No pedal edema. Gastrointestinal system: Abdomen is nondistended, soft and nontender. No organomegaly or masses felt. Normal bowel sounds heard. Central nervous system: She continues to have left upper extremity paralysis, 3-4 out of 5 muscle strength to her left lower extremity, left-sided neglect, facial droop at slurred speech Extremities: Symmetric 5 x 5 power. Skin: No rashes, lesions or ulcers Psychiatry: She is confused and disoriented    Data Reviewed: I have personally reviewed following labs and imaging studies  CBC:  Recent Labs Lab 08/04/16 0738 08/04/16 0746 08/05/16 0500  WBC 9.5  --  8.2  NEUTROABS 7.0  --   --   HGB 11.9* 13.3 12.2  HCT 36.8 39.0 38.0  MCV 88.9  --  89.6  PLT 252  --  [id number]   Basic Metabolic Panel:  Recent Labs Lab 08/04/16 0738 08/04/16 0746 08/05/16 0500  NA 137 138 134*  K 4.2 4.2 3.6  CL 98* 96* 102  CO2 28  --  26  GLUCOSE 274* 270* 165*  BUN 25* 24* 18  CREATININE 1.01* 1.10* 0.87  CALCIUM 8.8*  --  8.0*   GFR: Estimated Creatinine Clearance: 35.7 mL/min (by C-G formula based on SCr of 0.87 mg/dL). Liver Function Tests:  Recent Labs Lab 08/04/16 0738  AST 20  ALT 16  ALKPHOS 108  BILITOT 1.0  PROT 7.5  ALBUMIN 4.2   No results for input(s): LIPASE, AMYLASE in the last 168 hours. No results for input(s): AMMONIA in the last 168 hours. Coagulation Profile:  Recent Labs Lab 08/04/16 0738  INR 1.00   Cardiac Enzymes: No results for input(s): CKTOTAL, CKMB, CKMBINDEX, TROPONINI in the last 168 hours. BNP (last 3 results) No results for input(s): PROBNP in the last 8760 hours. HbA1C: No results for input(s): HGBA1C in the last 72  hours. CBG:  Recent Labs Lab 08/07/16 0036 08/07/16 0411 08/07/16 0459 08/07/16 0831 08/07/16 1153  GLUCAP 79 110* 129* 167* 184*   Lipid Profile:  Recent Labs  08/05/16 0500  CHOL 161  HDL 31*  LDLCALC 112*  TRIG 91  CHOLHDL 5.2   Thyroid Function Tests: No results for input(s): TSH, T4TOTAL, FREET4, T3FREE, THYROIDAB in the last 72 hours. Anemia Panel: No results for input(s): VITAMINB12, FOLATE, FERRITIN, TIBC, IRON, RETICCTPCT in the last 72 hours. Sepsis Labs: No results for input(s): PROCALCITON, LATICACIDVEN in the last 168 hours.  Recent Results (from the past 240 hour(s))  MRSA PCR Screening     Status: None   Collection Time: 08/04/16  6:14 PM  Result Value Ref Range Status   MRSA by PCR NEGATIVE NEGATIVE Final    Comment:        The GeneXpert MRSA Assay (  FDA approved for NASAL specimens only), is one component of a comprehensive MRSA colonization surveillance program. It is not intended to diagnose MRSA infection nor to guide or monitor treatment for MRSA infections.          Radiology Studies: No results found.      Scheduled Meds: .  stroke: mapping our early stages of recovery book   Does not apply Once  . aspirin  300 mg Rectal Daily   Or  . aspirin  325 mg Oral Daily  . insulin aspart  0-15 Units Subcutaneous Q4H   Continuous Infusions: . sodium chloride 75 mL/hr at 08/06/16 2057  . dextrose 30 mL/hr at 08/05/16 0600     LOS: 3 days    Time spent: 25 min    Kelvin Cellar, MD [facility] Pager [phone]  If 7PM-7AM, please contact night-coverage [email or web address] Password [email or web address] 08/07/2016, 1:18 PM

## 2016-08-08 LAB — GLUCOSE, CAPILLARY
GLUCOSE-CAPILLARY: 95 mg/dL (ref 65–99)
GLUCOSE-CAPILLARY: 176 mg/dL — AB (ref 65–99)
GLUCOSE-CAPILLARY: 121 mg/dL — AB (ref 65–99)
GLUCOSE-CAPILLARY: 86 mg/dL (ref 65–99)
GLUCOSE-CAPILLARY: 105 mg/dL — AB (ref 65–99)

## 2016-08-08 MED ORDER — METOPROLOL TARTRATE 25 MG PO TABS: 12.5000 mg | ORAL_TABLET | Freq: Two times a day (BID) | ORAL | 1 refills | Status: AC

## 2016-08-08 MED ORDER — HYDROCODONE-ACETAMINOPHEN 5-325 MG PO TABS: 1.0000 | ORAL_TABLET | Freq: Every day | ORAL | 0 refills | Status: AC | PRN

## 2016-08-08 MED ORDER — RESOURCE THICKENUP CLEAR PO POWD: 1.0000 | ORAL | 0 refills | Status: AC | PRN

## 2016-08-08 MED ORDER — ALPRAZOLAM 0.5 MG PO TABS: 0.5000 mg | ORAL_TABLET | Freq: Two times a day (BID) | ORAL | 0 refills | Status: AC | PRN

## 2016-08-08 NOTE — Progress Notes (Signed)
Speech Language Pathology Treatment: Dysphagia  Patient Details Name: SIDONIE ROSANDER MRN: [id number] DOB: 10-Mar-1922 Today's Date: 08/08/2016 Time: 1030-1100 SLP Time Calculation (min) (ACUTE ONLY): 30 min  Assessment / Plan / Recommendation Clinical Impression  SLP spoke with Mrs. Basley's granddaughters regarding dysphagia recommendations. They expressed that they want their grandmother to be comfortable. Pt awoke during our conversation and requested water. SLP provided oral care and presented single ice chips and small sips of thin water. Pt presented with occasional throat clearing and one episode of overt coughing followed by a sneeze. Given transitioning to comfort measures at [facility], consider liberalizing diet if pt desires (to allow thin liquids). Recommend SLP to evaluate pt once back at [facility] to see if diet can be liberalized and caregivers can be trained for proper oral care and safe administration of po. Granddaughters are in agreement with plan of care.   HPI HPI: Dawn T. Gudgel is a 82 y/o F with multiple comorbidities. The morning of 9/15 she was found to have left-sided weakness associated with slurred speech by nursing home staff, last seen normal at 5 AM with medicine administration. Nursing home staff reporting that she had difficulties swallowing and had been slurring her words which was not baseline for her. History is limited from her, I obtained the above history from emergency room staff, nursing home staff, family members. MRI revealed acute Moderate-sized right MCA infarct due to chronic untreated atrial fibrillation. Overall prognosis for functional recovery poor given baseline comorbidities including advanced dementia and age. Pt failed stroke swallow and SLP will complete clinical swallow evaluation and treat as indicated.      SLP Plan  Continue with current plan of care     Recommendations  Diet recommendations: Dysphagia 1 (puree);Nectar-thick  liquid Liquids provided via: Cup Medication Administration: Whole meds with puree (observed whole with puree this date) Supervision: Staff to assist with self feeding;Full supervision/cueing for compensatory strategies Compensations: Minimize environmental distractions;Small sips/bites;Multiple dry swallows after each bite/sip;Follow solids with liquid;Clear throat intermittently;Effortful swallow Postural Changes and/or Swallow Maneuvers: Seated upright 90 degrees;Upright 30-60 min after meal                Oral Care Recommendations: Oral care BID;Staff/trained caregiver to provide oral care Follow up Recommendations: 24 hour supervision/assistance Plan: Continue with current plan of care                     Thank you,  Genene Churn, [place]   [place] 08/08/2016, 11:05 AM

## 2016-08-08 NOTE — Progress Notes (Signed)
Tele called stating pt in SVT hr 160s  On assessment pt yelling and confused  VSS  Dr schorr aware No new orders

## 2016-08-08 NOTE — Progress Notes (Signed)
Occupational Therapy Discharge Patient Details Name: Alicia Mason MRN: [id number] DOB: 1922/02/19 Today's Date: 08/08/2016    Patient discharged from OT services secondary to: Pt is discharging to ALF with hospice.  Pt is total care s/p CVA and family is requesting return to ALF with hospice measures. Pt will be removed from OT services. Thank you.     Guadelupe Sabin, OTR/L  [phone] 08/08/2016, 12:05 PM

## 2016-08-08 NOTE — NC FL2 (Signed)
[place] MEDICAID FL2 LEVEL OF CARE SCREENING TOOL     IDENTIFICATION  Patient Name: Alicia Mason Birthdate: 1922-04-16 Sex: female Admission Date (Current Location): 08/04/2016  [facility] and [place] Number:  [employer] and Address:  [facility] [address] [place]      Provider Number: [id number]  Attending Physician Name and Address:  Kelvin Cellar, MD  Relative Name and Phone Number:       Current Level of Care: Hospital Recommended Level of Care: [place] Prior Approval Number:    Date Approved/Denied:   PASRR Number:    Discharge Plan: Other (Comment) (ALF)    Current Diagnoses: Patient Active Problem List   Diagnosis Date Noted  . Palliative care encounter   . Goals of care, counseling/discussion   . DNR (do not resuscitate) discussion   . Stroke (cerebrum) ([place]) 08/04/2016  . CVA (cerebral infarction) 08/04/2016  . Acute CVA (cerebrovascular accident) ([place]) 08/04/2016  . Chronic systolic (congestive) heart failure ([place]) 08/04/2016  . Dieulafoy lesion of colon 07/08/2015  . Colon polyps 07/08/2015  . Gastritis 07/08/2015  . Pyloric stenosis 07/08/2015  . Bleeding gastrointestinal   . Anemia due to blood loss, acute   . Rectal bleeding   . Acute on chronic combined systolic and diastolic heart failure ([place]) 10/01/2014  . Paroxysmal atrial fibrillation ([place]) 10/01/2014  . Essential hypertension 10/01/2014  . History of arterial ischemic stroke 10/01/2014  . Chronic anxiety 10/01/2014  . Anemia due to other cause 10/01/2014  . Protein-calorie malnutrition, severe ([place]) 05/29/2014  . Hip fracture 05/28/2014  . Hip fracture ([place]) 05/28/2014  . Dermatitis 09/27/2013  . Hypokalemia 09/27/2013  . Anxiety 09/27/2013  . Dyslipidemia 08/07/2013  . Abnormal thyroid blood test 08/06/2013  . Acute ischemic stroke ([place]) 08/04/2013  . Fever 08/03/2013  . Acute encephalopathy 08/03/2013  . Hypoglycemia  08/03/2013  . UTI (lower urinary tract infection) 07/30/2013  . Confusion 07/30/2013  . Ataxia 07/30/2013  . Ischemic cardiomyopathy 07/13/2011  . Bradycardia 07/12/2011  . Near syncope 07/11/2011  . NSTEMI (non-ST elevated myocardial infarction) ([place]) 07/11/2011  . CAD (coronary artery disease) 07/11/2011  . Hx of CABG 07/11/2011  . DM type 2 (diabetes mellitus, type 2) ([place]) 07/11/2011  . HTN (hypertension), malignant 07/11/2011  . Cerebrovascular disease 07/11/2011  . Vertigo 07/11/2011  . FRACTURE, HUMERUS, PROXIMAL 11/24/2009  . IDDM 05/28/2009  . HYPERTENSION 05/28/2009  . CAD 05/28/2009  . Atrial fibrillation ([place]) 05/28/2009  . GERD 05/28/2009  . Osteoarthritis 05/28/2009    Orientation RESPIRATION BLADDER Height & Weight     Self  Normal Incontinent Weight: 120 lb 12.8 oz (54.8 kg) Height:  5\' 5"  (165.1 cm)  BEHAVIORAL SYMPTOMS/MOOD NEUROLOGICAL BOWEL NUTRITION STATUS  Other (Comment) (none)  (n/a) Incontinent Diet (Dysphagia 1 with honey thick liquids. Meds crushed in puree. Full supervision. Check left sulci for pocketing frequently. )  AMBULATORY STATUS COMMUNICATION OF NEEDS Skin   Total Care Verbally Skin abrasions, Other (Comment) (Moisture associated skin damage groin, perineum)                       Personal Care Assistance Level of Assistance         Total care     Functional Limitations Info  Sight, Hearing, Speech Sight Info: Impaired Hearing Info: Adequate Speech Info: Adequate    SPECIAL CARE FACTORS FREQUENCY  Contractures      Additional Factors Info  Insulin Sliding Scale Code Status Info: DNR Allergies Info: Cephalexin           Current Medications (08/08/2016):  This is the current hospital active medication list Current Facility-Administered Medications  Medication Dose Route Frequency Provider Last Rate Last Dose  .  stroke: mapping our early stages of recovery book   Does not apply Once  Kelvin Cellar, MD      . aspirin suppository 300 mg  300 mg Rectal Daily Kelvin Cellar, MD   300 mg at 08/05/16 1314   Or  . aspirin tablet 325 mg  325 mg Oral Daily Kelvin Cellar, MD   325 mg at 08/08/16 1002  . insulin aspart (novoLOG) injection 0-15 Units  0-15 Units Subcutaneous Q4H Kelvin Cellar, MD   2 Units at 08/08/16 [phone]  . metoprolol tartrate (LOPRESSOR) tablet 12.5 mg  12.5 mg Oral BID Kelvin Cellar, MD   12.5 mg at 08/08/16 1002  . morphine 2 MG/ML injection 1 mg  1 mg Intravenous Q2H PRN Kelvin Cellar, MD   1 mg at 08/07/16 2047  . [place]   Oral PRN Kelvin Cellar, MD      . senna-docusate (Senokot-S) tablet 1 tablet  1 tablet Oral QHS PRN Kelvin Cellar, MD         Discharge Medications: Current Discharge Medication List        START taking these medications   Details  Maltodextrin-Xanthan Gum (RESOURCE THICKENUP CLEAR) POWD Take 120 g by mouth as needed. Qty: 1 Can, Refills: 0          CONTINUE these medications which have CHANGED   Details  ALPRAZolam (XANAX) 0.5 MG tablet Take 1 tablet (0.5 mg total) by mouth 2 (two) times daily as needed for anxiety or sleep. Qty: 15 tablet, Refills: 0    HYDROcodone-acetaminophen (NORCO/VICODIN) 5-325 MG tablet Take 1 tablet by mouth daily as needed for moderate pain. Qty: 15 tablet, Refills: 0          CONTINUE these medications which have NOT CHANGED   Details  furosemide (LASIX) 20 MG tablet Take 1 tablet (20 mg total) by mouth 2 (two) times daily. For treatment of heart failure. Qty: 60 tablet, Refills: 3         STOP taking these medications     aluminum-magnesium hydroxide-simethicone (MAALOX) 200-200-20 MG/5ML SUSP      cetirizine (ZYRTEC) 10 MG tablet      Dextrose, Diabetic Use, (GLUCOSE) 77.4 % GEL      donepezil (ARICEPT) 10 MG tablet      Insulin Glargine (LANTUS SOLOSTAR) 100 UNIT/ML Solostar Pen      loperamide (IMODIUM A-D) 2 MG tablet       metoprolol tartrate (LOPRESSOR) 25 MG tablet      Phenylephrine-DM-GG (ROBITUSSIN CHILD COUGH/COLD CF) 2.03-24-49 MG/5ML LIQD      potassium chloride (K-DUR) 10 MEQ tablet      ranitidine (ZANTAC) 150 MG tablet      metoprolol (LOPRESSOR) 50 MG tablet          Relevant Imaging Results:  Relevant Lab Results:   Additional Information Hospice to follow.   Benay Pike [place], [place]

## 2016-08-08 NOTE — Discharge Summary (Addendum)
Physician Discharge Summary  Alicia Mason [id number] DOB: 09-Mar-1922 DOA: 08/04/2016  PCP: Glo Herring., MD  Admit date: 08/04/2016 Discharge date: 08/08/2016  Time spent: 35 minutes  Recommendations for Outpatient Follow-up:  1. Mrs. Renninger was admitted for acute CVA, having significant disability, has been transitioned to full comfort measures. MOST form has been completed. She will be discharged back to her facility with hospice services continue to provide care.    Discharge Diagnoses:  Active Problems:   Atrial fibrillation (HCC)   DM type 2 (diabetes mellitus, type 2) ([place])   Acute encephalopathy   Essential hypertension   Acute CVA (cerebrovascular accident) ([place])   Chronic systolic (congestive) heart failure ([place])   Palliative care encounter   Goals of care, counseling/discussion   DNR (do not resuscitate) discussion   Discharge Condition: Stable to transport to facility   Diet recommendation: As tolerated  Filed Weights   08/04/16 0822 08/04/16 0829 08/04/16 1630  Weight: 58.5 kg (128 lb 14.4 oz) 58.5 kg (128 lb 14.4 oz) 54.8 kg (120 lb 12.8 oz)    History of present illness:  Alicia Mason is a 82 y.o. female with multiple comorbidities, including having history of atrial fibrillation, systolic heart failure last transthoracic echocardiogram performed in 2015 that revealed reduced ejection fraction of 40-45%, history of severe GI bleed for which Xarelto was discontinued, insulin-dependent diabetes mellitus, hypertension, presented as a transfer to the emergency department from her skilled nursing facility. This morning she was found to have left-sided weakness associated with slurred speech by nursing home staff, last seen normal at 5 AM with medicine administration. Nursing home staff reporting that she had difficulties swallowing and had been slurring her words which was not baseline for her. History is limited from her, I obtained the above history from  emergency room staff, nursing home staff, family members   Hospital Course:  Alicia Mason is a 82 year old female having multiple comorbidities including a history of atrial fibrillation, chronic systolic heart failure, history of severe GI bleed for which anticoagulation was discontinued, admitted to the medicine service on 08/04/2016 when she presented with complaints of left-sided weakness and slurred speech. She presented as a transfer from her nursing home. On initial evaluation she had flaccid paralysis of her left extremity along with left-sided facial droop and slurred speech. Initially she failed her swallow screen. She was placed on the stroke protocol. She was not felt to be candidate for thrombolytic therapy. MRI of brain revealed an acute right MCA infarct involving posterior frontal lobe and insula. She was seen and evaluated by neurology. Treated with aspirin.  1.  Acute CVA -[name] is a pleasant 82 year old female having a history of atrial fibrillation, previously had been on anticoagulant therapy with Xarelto, stopped about a year ago after she presented with a severe GI bleed -She presented with left-sided weakness and slurred speech -MRI of brain showing acute right MCA infarct involving posterior frontal lobe and insula -Per nursing staff she failed swallow screen -Neurology consulted, felt to have poor prognosis, and recommending palliative care consult.  SLP recommending dysphagia 1 diet -Palliative Care consulted, held family meeting with her two granddaughters on 08/08/2016, was transitioned to comfort care. They expressed wishes to go back to her facility with hospice services. Case manager consulted to [facility] with setting up Hospice.   2.  History of atrial fibrillation -CHADSVasc score of at least 5 -Have been anticoagulant with the results of until year ago when this was discontinued when she  presented with severe GI bleed and hemoglobin 5.7. -Unfortunately she  now presents with CVA -On Metoprolol 25 mg PO BID  3.  Chronic systolic congestive heart failure.  -A transthoracic echocardiogram from 2015 showed an EF of 40-45% with severe hypokinesis. She is currently receiving IV fluids since she is nothing by mouth. Monitor volume status closely. -On 08/07/2016 looks better, will stop IV fluids and encourage oral intake  4.  History of hypertension.  -Antihypertensive agents held to favor cerebral perfusion in the setting of stroke -On 08/07/2016 will restart her metoprolol at 12.5 mg by mouth twice a day, monitor blood pressures carefully  5.  History of type 2 diabetes mellitus.  -She remains nothing by mouth, was hypoglycemic overnight for which she was administered dextrose. Hypoglycemic agents held. -On 08/07/2016: Diet advanced to dysphagia 1 diet  Procedures:  Echo Impression:  - Left ventricular systolic function is now severely decreased   estimated at 20-25%, previously 40-45%. Mildly decreased right   ventricular function. Severe pulmonary hypertension.  Consultations:  Neurology  Discharge Exam: Vitals:   08/08/16 0723 08/08/16 1100  BP: (!) 142/75 (!) 133/94  Pulse: 84 (!) 54  Resp: 20 18  Temp: 98.7 F (37.1 C) 98.3 F (36.8 C)    General exam: Appears comfortable Respiratory system: Clear to auscultation. Respiratory effort normal. Cardiovascular system: Irregular rate and rhythm. No JVD, murmurs, rubs, gallops or clicks. No pedal edema. Gastrointestinal system: Abdomen is nondistended, soft and nontender. No organomegaly or masses felt. Normal bowel sounds heard. Central nervous system: She continues to have left upper extremity paralysis, 3-4 out of 5 muscle strength to her left lower extremity, left-sided neglect, facial droop at slurred speech Skin: No rashes, lesions or ulcers Psychiatry: She is confused and disoriented  Discharge Instructions   Discharge Instructions    Call MD for:  severe uncontrolled  pain    Complete by:  As directed    Diet - low sodium heart healthy    Complete by:  As directed    Increase activity slowly    Complete by:  As directed      Current Discharge Medication List    START taking these medications   Details  Maltodextrin-Xanthan Gum (RESOURCE THICKENUP CLEAR) POWD Take 120 g by mouth as needed. Qty: 1 Can, Refills: 0      CONTINUE these medications which have CHANGED   Details  ALPRAZolam (XANAX) 0.5 MG tablet Take 1 tablet (0.5 mg total) by mouth 2 (two) times daily as needed for anxiety or sleep. Qty: 15 tablet, Refills: 0    HYDROcodone-acetaminophen (NORCO/VICODIN) 5-325 MG tablet Take 1 tablet by mouth daily as needed for moderate pain. Qty: 15 tablet, Refills: 0      CONTINUE these medications which have NOT CHANGED   Details  furosemide (LASIX) 20 MG tablet Take 1 tablet (20 mg total) by mouth 2 (two) times daily. For treatment of heart failure. Qty: 60 tablet, Refills: 3      STOP taking these medications     aluminum-magnesium hydroxide-simethicone (MAALOX) 200-200-20 MG/5ML SUSP      cetirizine (ZYRTEC) 10 MG tablet      Dextrose, Diabetic Use, (GLUCOSE) 77.4 % GEL      donepezil (ARICEPT) 10 MG tablet      Insulin Glargine (LANTUS SOLOSTAR) 100 UNIT/ML Solostar Pen      loperamide (IMODIUM A-D) 2 MG tablet      metoprolol tartrate (LOPRESSOR) 25 MG tablet      Phenylephrine-DM-GG (  ROBITUSSIN CHILD COUGH/COLD CF) 2.03-24-49 MG/5ML LIQD      potassium chloride (K-DUR) 10 MEQ tablet      ranitidine (ZANTAC) 150 MG tablet      metoprolol (LOPRESSOR) 50 MG tablet        Allergies  Allergen Reactions  . Cephalexin     NOT SURE OF THE REACTION, BUT WAS TOLD NOT TO TAKE IT. Granddaughter mentioned that patient had hives, rash. Denies anaphylactic reaction.      The results of significant diagnostics from this hospitalization (including imaging, microbiology, ancillary and laboratory) are listed below for reference.     Significant Diagnostic Studies: Ct Angio Head W Or Wo Contrast  Result Date: 08/04/2016 CLINICAL DATA:  Altered mental status.  Difficulty swallowing. EXAM: CT ANGIOGRAPHY HEAD TECHNIQUE: Multidetector CT imaging of the head was performed using the standard protocol during bolus administration of intravenous contrast. Multiplanar CT image reconstructions and MIPs were obtained to evaluate the vascular anatomy. CONTRAST:  60 cc Isovue 370 intravenous COMPARISON:  Head CT from earlier today FINDINGS: CTA HEAD Anterior circulation: Symmetric carotid arteries and branching. Incomplete circle-of-Willis at the left posterior communicating artery. Diffuse atherosclerotic calcification on the carotid siphons with calcific plaque blooming partially obscuring the luminal contrast. No proximal occlusion. No reversible flow limiting stenosis. There is a segment of weak flow within a right insular branch, M2-3 level. Negative for aneurysm. Posterior circulation: Left dominant vertebral artery. Standard vertebrobasilar branching. Focal high-grade left P1/2 segment stenosis. No major branch occlusion. The vertebral and basilar arteries are smooth and widely patent. Atherosclerotic calcified plaque on the proximal V4 segments. Venous sinuses: Patent where opacified. Anatomic variants: No notable Delayed phase:Enhancing 18 mm meningioma without mass effect located along the left petrous temporal bone. Small focus of internal calcification. No concerning growth when compared to priors. Remote ischemic injury and atrophy as described on preceding noncontrast head CT. Right insular indistinctness suspected on the previous scan is less convincing on this scan which is less affected by motion. IMPRESSION: 1. No emergent large vessel occlusion. 2. High-grade right M2-3 segment stenosis which could be from atherosclerosis or thrombus. 3. Notable atheromatous proximal left PCA stenosis. 4. 18 mm meningioma in the left posterior fossa  without mass effect Electronically Signed   By: Monte Fantasia M.D.   On: 08/04/2016 10:05   Mr Jodene Nam Head Wo Contrast  Result Date: 08/04/2016 CLINICAL DATA:  Left-sided weakness and slurred speech. EXAM: MRI HEAD WITHOUT CONTRAST MRA HEAD WITHOUT CONTRAST TECHNIQUE: Multiplanar, multiecho pulse sequences of the brain and surrounding structures were obtained without intravenous contrast. Angiographic images of the head were obtained using MRA technique without contrast. COMPARISON:  Head CT and CTA 08/04/2016.  Brain MRI 08/04/2013. FINDINGS: MRI HEAD FINDINGS The study is moderately motion degraded. There is a small acute right MCA infarct involving the posterior right frontal lobe, predominantly the operculum, as well as a portion of the right insula. No gross intracranial hemorrhage, intra-axial mass, midline shift, or extra-axial fluid collection is seen. There is moderate cerebral atrophy. Chronic bilateral parietal cortical infarcts are new from the prior MRI. T2 hyperintensities elsewhere in the deep cerebral white matter bilaterally are similar to the prior MRI and compatible with chronic small vessel ischemia. Extra-axial mass in the posterior fossa along the petrous temporal bone measures 19 x 12 mm, slightly smaller than on the prior MRI. Prior bilateral cataract extraction is noted. Mild mucosal thickening and small to moderate volume fluid in the right maxillary sinus. Clear mastoid air cells.  Major intracranial vascular flow voids are grossly preserved. 15 mm T2 hyperintense is stable to minimally larger than on the prior MRI, favored to be benign. MRA HEAD FINDINGS The study is moderately to severely motion degraded. The V4 segments of the vertebral arteries are not well evaluated due to motion artifact. Flow is grossly identified in the dominant left vertebral artery. The basilar artery is patent without evidence of significant stenosis. There is a patent right posterior communicating artery with  hypoplastic right P1 segment. The left P1 segment is patent with a high-grade stenosis again seen near the P1-P2 junction. Artifact precludes assessment of the P2 in more distal PCAs. The internal carotid arteries are patent from skullbase to carotid termini. Focal areas of relative signal dropout in both ICAs are felt to be artifactual due to lack of high-grade stenosis on earlier CTA. There is likely mild stenosis involving the anterior genu and supraclinoid segments bilaterally. M1 segments are patent proximally. More distal M1 segments, MCA bifurcations, and MCA branch vessels are inadequately evaluated due to motion artifact and poor signal. There is gross flow related enhancement in the A1 and A2 segments, however evaluation for underlying stenosis is severely limited by artifact. IMPRESSION: 1. Acute right MCA infarct involving the posterior frontal lobe and insula. 2. Chronic bilateral parietal infarcts. 3. 19 mm meningioma along the left petrous temporal bone, slightly smaller than in 2014. 4. Moderately to severely motion degraded head MRA as above. Intracranial circulation much better assessed on today's earlier CTA. Electronically Signed   By: Logan Bores M.D.   On: 08/04/2016 12:05   Mr Brain Wo Contrast (neuro Protocol)  Result Date: 08/04/2016 CLINICAL DATA:  Left-sided weakness and slurred speech. EXAM: MRI HEAD WITHOUT CONTRAST MRA HEAD WITHOUT CONTRAST TECHNIQUE: Multiplanar, multiecho pulse sequences of the brain and surrounding structures were obtained without intravenous contrast. Angiographic images of the head were obtained using MRA technique without contrast. COMPARISON:  Head CT and CTA 08/04/2016.  Brain MRI 08/04/2013. FINDINGS: MRI HEAD FINDINGS The study is moderately motion degraded. There is a small acute right MCA infarct involving the posterior right frontal lobe, predominantly the operculum, as well as a portion of the right insula. No gross intracranial hemorrhage,  intra-axial mass, midline shift, or extra-axial fluid collection is seen. There is moderate cerebral atrophy. Chronic bilateral parietal cortical infarcts are new from the prior MRI. T2 hyperintensities elsewhere in the deep cerebral white matter bilaterally are similar to the prior MRI and compatible with chronic small vessel ischemia. Extra-axial mass in the posterior fossa along the petrous temporal bone measures 19 x 12 mm, slightly smaller than on the prior MRI. Prior bilateral cataract extraction is noted. Mild mucosal thickening and small to moderate volume fluid in the right maxillary sinus. Clear mastoid air cells. Major intracranial vascular flow voids are grossly preserved. 15 mm T2 hyperintense is stable to minimally larger than on the prior MRI, favored to be benign. MRA HEAD FINDINGS The study is moderately to severely motion degraded. The V4 segments of the vertebral arteries are not well evaluated due to motion artifact. Flow is grossly identified in the dominant left vertebral artery. The basilar artery is patent without evidence of significant stenosis. There is a patent right posterior communicating artery with hypoplastic right P1 segment. The left P1 segment is patent with a high-grade stenosis again seen near the P1-P2 junction. Artifact precludes assessment of the P2 in more distal PCAs. The internal carotid arteries are patent from skullbase to carotid termini. Focal  areas of relative signal dropout in both ICAs are felt to be artifactual due to lack of high-grade stenosis on earlier CTA. There is likely mild stenosis involving the anterior genu and supraclinoid segments bilaterally. M1 segments are patent proximally. More distal M1 segments, MCA bifurcations, and MCA branch vessels are inadequately evaluated due to motion artifact and poor signal. There is gross flow related enhancement in the A1 and A2 segments, however evaluation for underlying stenosis is severely limited by artifact.  IMPRESSION: 1. Acute right MCA infarct involving the posterior frontal lobe and insula. 2. Chronic bilateral parietal infarcts. 3. 19 mm meningioma along the left petrous temporal bone, slightly smaller than in 2014. 4. Moderately to severely motion degraded head MRA as above. Intracranial circulation much better assessed on today's earlier CTA. Electronically Signed   By: Logan Bores M.D.   On: 08/04/2016 12:05   US Carotid Bilateral  Result Date: 08/04/2016 CLINICAL DATA:  Acute CVA 1 day ago.  Acute right MCA infarct. EXAM: BILATERAL CAROTID DUPLEX ULTRASOUND TECHNIQUE: Pearline Cables scale imaging, color Doppler and duplex ultrasound were performed of bilateral carotid and vertebral arteries in the neck. COMPARISON:  Brain MRI 08/04/2016 FINDINGS: Criteria: Quantification of carotid stenosis is based on velocity parameters that correlate the residual internal carotid diameter with NASCET-based stenosis levels, using the diameter of the distal internal carotid lumen as the denominator for stenosis measurement. The following velocity measurements were obtained: RIGHT ICA:  58 cm/sec CCA:  41 cm/sec SYSTOLIC ICA/CCA RATIO:  1.4 DIASTOLIC ICA/CCA RATIO:  2.1 ECA:  134 cm/sec LEFT ICA:  69 cm/sec CCA:  50 cm/sec SYSTOLIC ICA/CCA RATIO:  1.4 DIASTOLIC ICA/CCA RATIO:  1.8 ECA:  120 cm/sec RIGHT CAROTID ARTERY: Moderate amount of plaque at the right carotid bulb. There is irregular plaque at the origin of the external carotid artery without significant stenosis. Plaque at the origin of the internal carotid artery without significant stenosis based on the waveforms and velocities. RIGHT VERTEBRAL ARTERY: Antegrade flow and normal waveform in the right vertebral artery. LEFT CAROTID ARTERY: Calcified plaque with shadowing at the left carotid bulb. Small amount of plaque in the proximal left external carotid artery. Small amount of plaque in the proximal internal carotid artery. No significant stenosis in the left internal  carotid artery based on the waveforms and velocities. LEFT VERTEBRAL ARTERY: Antegrade flow and normal waveform in the left vertebral artery. IMPRESSION: Atherosclerotic disease in the bilateral carotid arteries, most prominent at the right carotid bulb. Estimated degree of stenosis in the internal carotid arteries is less than 50% bilaterally. Patent vertebral arteries. Electronically Signed   By: Markus Daft M.D.   On: 08/04/2016 14:34   Ct Head Code Stroke W/o Cm  Result Date: 08/04/2016 CLINICAL DATA:  Code stroke. Altered mental status. Last seen normal 5 a.m. today. EXAM: CT HEAD WITHOUT CONTRAST TECHNIQUE: Contiguous axial images were obtained from the base of the skull through the vertex without intravenous contrast. COMPARISON:  07/12/2015 FINDINGS: Motion degraded exam. Indistinct posterior right insula. No hemorrhage, swelling, hydrocephalus, or shift. Deep gray nuclei are stable from prior, chronically indistinct. Since previous there have been biparietal cortically based infarcts, extending into the occipital lobe on the left. Atrophy with prominent mesial temporal and parietal involvement. This pattern can be seen with Alzheimer's disease. Meningioma along the posterior left petrous temporal bone measuring 15 mm on previous study, stable as permitted by artifact. ASPECTS [facility] Stroke Program Early CT Score) - Ganglionic level infarction (caudate, lentiform nuclei, internal capsule, insula,  M1-M3 cortex): 6 - Supraganglionic infarction (M4-M6 cortex): 3 Total score (0-10 with 10 being normal): 9 These results were called by telephone at the time of interpretation on 08/04/2016 at 8:03 am to Dr. Francine Graven , who verbally acknowledged these results. IMPRESSION: 1. Motion degraded exam. 2. Negative for hemorrhage. Indistinct right insula which could be motion artifact or ischemic. ASPECTS is 9 at worst. 3. Chronic biparietal infarct which has occurred since 2016. 4. Brain atrophy as described  above. 5. Stable approximately 15 mm meningioma along the left petrous bone. Electronically Signed   By: Monte Fantasia M.D.   On: 08/04/2016 08:09    Microbiology: Recent Results (from the past 240 hour(s))  MRSA PCR Screening     Status: None   Collection Time: 08/04/16  6:14 PM  Result Value Ref Range Status   MRSA by PCR NEGATIVE NEGATIVE Final    Comment:        The GeneXpert MRSA Assay (FDA approved for NASAL specimens only), is one component of a comprehensive MRSA colonization surveillance program. It is not intended to diagnose MRSA infection nor to guide or monitor treatment for MRSA infections.      Labs: Basic Metabolic Panel:  Recent Labs Lab 08/04/16 0738 08/04/16 0746 08/05/16 0500  NA 137 138 134*  K 4.2 4.2 3.6  CL 98* 96* 102  CO2 28  --  26  GLUCOSE 274* 270* 165*  BUN 25* 24* 18  CREATININE 1.01* 1.10* 0.87  CALCIUM 8.8*  --  8.0*   Liver Function Tests:  Recent Labs Lab 08/04/16 0738  AST 20  ALT 16  ALKPHOS 108  BILITOT 1.0  PROT 7.5  ALBUMIN 4.2   No results for input(s): LIPASE, AMYLASE in the last 168 hours. No results for input(s): AMMONIA in the last 168 hours. CBC:  Recent Labs Lab 08/04/16 0738 08/04/16 0746 08/05/16 0500  WBC 9.5  --  8.2  NEUTROABS 7.0  --   --   HGB 11.9* 13.3 12.2  HCT 36.8 39.0 38.0  MCV 88.9  --  89.6  PLT 252  --  163   Cardiac Enzymes: No results for input(s): CKTOTAL, CKMB, CKMBINDEX, TROPONINI in the last 168 hours. BNP: BNP (last 3 results) No results for input(s): BNP in the last 8760 hours.  ProBNP (last 3 results) No results for input(s): PROBNP in the last 8760 hours.  CBG:  Recent Labs Lab 08/07/16 1706 08/07/16 2034 08/07/16 2338 08/08/16 0443 08/08/16 0805  GLUCAP 95 108* 163* 176* 121*       Signed:  Kelvin Cellar MD.  [facility] 08/08/2016, 12:56 PM

## 2016-08-08 NOTE — Clinical Social Work Note (Signed)
Clinical Social Work Assessment  Patient Details  Name: Alicia Mason MRN: [id number] Date of Birth: 08-31-1922  Date of referral:  08/08/16               Reason for consult:  Discharge Planning                Permission sought to share information with:    Permission granted to share information::  Yes, Verbal Permission Granted  Name::        Agency::  [facility]  Relationship::  facility  Contact Information:     Housing/Transportation Living arrangements for the past 2 months:  [place] of Information:  Other (Comment Required) (granddaughter- Financial risk analyst) Patient Interpreter Needed:  None Criminal Activity/Legal Involvement Pertinent to Current Situation/Hospitalization:  No - Comment as needed Significant Relationships:  Other Family Members Lives with:  Facility Resident Do you feel safe going back to the place where you live?  Yes Need for family participation in patient care:  Yes (Comment)  Care giving concerns:  Pt is now total care after stroke.    Social Worker assessment / plan:  CSW attempted to meet with pt at bedside, but pt oriented to self only. Pt admitted with acute CVA. CSW spoke with Tammy at [facility] regarding pt as note from yesterday indicates granddaughters request for pt to return to [place] with hospice. They are aware of PT recommendation for SNF, but feel that pt would not want rehab and want to take a comfort approach. Pt's granddaughter, Leafy Ro requests [facility]. Tammy is aware of above and that pt is total care. Facility feels they can manage as pt is on memory care unit and will have hospice. Pt will d/c today via [facility] once hospital bed delivered to facility.   Employment status:  Retired Forensic scientist:  Medicare PT Recommendations:  [place] / Referral to community resources:  Other (Comment Required) (Return to [facility] with  hospice)  Patient/Family's Response to care:  Family request hospice at ALF.   Patient/Family's Understanding of and Emotional Response to Diagnosis, Current Treatment, and Prognosis:  Pt's granddaughters are aware of diagnosis and prognosis. After discussion with palliative care, they have requested for hospice to follow pt at [facility].   Emotional Assessment Appearance:  Appears stated age Attitude/Demeanor/Rapport:  Unable to Assess Affect (typically observed):  Unable to Assess Orientation:  Oriented to Self Alcohol / Substance use:  Not Applicable Psych involvement (Current and /or in the community):  No (Comment)  Discharge Needs  Concerns to be addressed:  Discharge Planning Concerns Readmission within the last 30 days:  No Current discharge risk:  Terminally ill Barriers to Discharge:  No Barriers Identified   Salome Arnt, [place] 08/08/2016, 10:16 AM [phone]

## 2016-08-08 NOTE — Progress Notes (Signed)
Patient with orders to be discharge to [facility] with Hospice. Report called to [facility], nurse. Patient to be transferred via EMS once hospital bed arrives at facility.

## 2016-08-08 NOTE — Care Management Note (Signed)
Case Management Note  Patient Details  Name: Alicia Mason MRN: [id number] Date of Birth: 29-Feb-1922   Expected Discharge Date:      08/08/2016            Expected Discharge Plan:  Assisted Living / [facility] (with hospice)  In-House Referral:  Clinical Social Work  Discharge planning Services  CM Consult  Post Acute Care Choice:  Durable Medical Equipment Choice offered to:  NA  DME Arranged:  Hospital bed DME Agency:  [place]:    [facility] Agency:     Status of Service:  Completed, signed off  If discussed at [place] of Stay Meetings, dates discussed:    Additional Comments: Pt discharging today back to [facility] with Hospice services. Family has chosen Hospice and [facility]. Referral and has been called and pt info provided. Pt will need hospital bed, [facility] uses [facility] for DME needs. Romualdo Bolk, of [facility], aware of DME needs and will obtain pt info from chart and have bed delivered to [place] prior to [place] has made arrangements for return to [place]. Pt will need to transport via EMS.  Sherald Barge, RN 08/08/2016, 1:22 PM

## 2016-08-20 DEATH — deceased
